# Patient Record
Sex: Male | Born: 1935
Health system: Southern US, Community
[De-identification: ages and names within clinical notes are randomized; demographics above are authoritative.]

## PROBLEM LIST (undated history)

## (undated) DIAGNOSIS — Z8719 Personal history of other diseases of the digestive system: Secondary | ICD-10-CM

## (undated) DIAGNOSIS — I340 Nonrheumatic mitral (valve) insufficiency: Secondary | ICD-10-CM

## (undated) DIAGNOSIS — I2510 Atherosclerotic heart disease of native coronary artery without angina pectoris: Secondary | ICD-10-CM

## (undated) DIAGNOSIS — H409 Unspecified glaucoma: Secondary | ICD-10-CM

## (undated) DIAGNOSIS — M1990 Unspecified osteoarthritis, unspecified site: Secondary | ICD-10-CM

## (undated) DIAGNOSIS — I10 Essential (primary) hypertension: Secondary | ICD-10-CM

## (undated) DIAGNOSIS — R42 Dizziness and giddiness: Secondary | ICD-10-CM

## (undated) DIAGNOSIS — K222 Esophageal obstruction: Secondary | ICD-10-CM

## (undated) DIAGNOSIS — G629 Polyneuropathy, unspecified: Secondary | ICD-10-CM

## (undated) DIAGNOSIS — K219 Gastro-esophageal reflux disease without esophagitis: Secondary | ICD-10-CM

## (undated) DIAGNOSIS — C801 Malignant (primary) neoplasm, unspecified: Secondary | ICD-10-CM

## (undated) DIAGNOSIS — I739 Peripheral vascular disease, unspecified: Secondary | ICD-10-CM

## (undated) HISTORY — PX: OTHER SURGICAL HISTORY: SHX169

## (undated) HISTORY — PX: ESOPHAGOGASTRODUODENOSCOPY: SHX1529

## (undated) HISTORY — PX: COLONOSCOPY: SHX174

---

## 1993-07-14 DIAGNOSIS — C801 Malignant (primary) neoplasm, unspecified: Secondary | ICD-10-CM

## 1993-07-14 HISTORY — PX: OTHER SURGICAL HISTORY: SHX169

## 1993-07-14 HISTORY — DX: Malignant (primary) neoplasm, unspecified: C80.1

## 1999-07-15 HISTORY — PX: ROTATOR CUFF REPAIR: SHX139

## 2003-07-15 HISTORY — PX: CORONARY ANGIOPLASTY WITH STENT PLACEMENT: SHX49

## 2006-12-28 DIAGNOSIS — J309 Allergic rhinitis, unspecified: Secondary | ICD-10-CM | POA: Insufficient documentation

## 2007-06-23 DIAGNOSIS — R209 Unspecified disturbances of skin sensation: Secondary | ICD-10-CM | POA: Insufficient documentation

## 2007-06-23 DIAGNOSIS — I8390 Asymptomatic varicose veins of unspecified lower extremity: Secondary | ICD-10-CM | POA: Insufficient documentation

## 2007-06-23 HISTORY — DX: Unspecified disturbances of skin sensation: R20.9

## 2008-04-13 DIAGNOSIS — D126 Benign neoplasm of colon, unspecified: Secondary | ICD-10-CM | POA: Insufficient documentation

## 2008-04-13 HISTORY — DX: Benign neoplasm of colon, unspecified: D12.6

## 2009-05-09 DIAGNOSIS — I25700 Atherosclerosis of coronary artery bypass graft(s), unspecified, with unstable angina pectoris: Secondary | ICD-10-CM | POA: Insufficient documentation

## 2009-05-09 DIAGNOSIS — I2510 Atherosclerotic heart disease of native coronary artery without angina pectoris: Secondary | ICD-10-CM | POA: Insufficient documentation

## 2009-05-09 DIAGNOSIS — I10 Essential (primary) hypertension: Secondary | ICD-10-CM | POA: Insufficient documentation

## 2009-07-14 HISTORY — PX: CORONARY ARTERY BYPASS GRAFT: SHX141

## 2012-10-05 DIAGNOSIS — I214 Non-ST elevation (NSTEMI) myocardial infarction: Secondary | ICD-10-CM

## 2012-10-05 HISTORY — DX: Non-ST elevation (NSTEMI) myocardial infarction: I21.4

## 2012-10-06 DIAGNOSIS — I25700 Atherosclerosis of coronary artery bypass graft(s), unspecified, with unstable angina pectoris: Secondary | ICD-10-CM | POA: Insufficient documentation

## 2013-03-07 DIAGNOSIS — G4700 Insomnia, unspecified: Secondary | ICD-10-CM | POA: Insufficient documentation

## 2013-11-11 DIAGNOSIS — IMO0002 Reserved for concepts with insufficient information to code with codable children: Secondary | ICD-10-CM | POA: Insufficient documentation

## 2013-11-11 DIAGNOSIS — S83209A Unspecified tear of unspecified meniscus, current injury, unspecified knee, initial encounter: Secondary | ICD-10-CM | POA: Insufficient documentation

## 2013-11-11 HISTORY — DX: Unspecified tear of unspecified meniscus, current injury, unspecified knee, initial encounter: S83.209A

## 2014-02-22 DIAGNOSIS — IMO0002 Reserved for concepts with insufficient information to code with codable children: Secondary | ICD-10-CM | POA: Insufficient documentation

## 2014-02-22 DIAGNOSIS — M1710 Unilateral primary osteoarthritis, unspecified knee: Secondary | ICD-10-CM | POA: Insufficient documentation

## 2014-07-14 HISTORY — PX: CORONARY STENT PLACEMENT: SHX1402

## 2015-07-22 DIAGNOSIS — E785 Hyperlipidemia, unspecified: Secondary | ICD-10-CM | POA: Insufficient documentation

## 2016-01-25 DIAGNOSIS — I25119 Atherosclerotic heart disease of native coronary artery with unspecified angina pectoris: Secondary | ICD-10-CM | POA: Diagnosis not present

## 2016-01-25 DIAGNOSIS — Z951 Presence of aortocoronary bypass graft: Secondary | ICD-10-CM | POA: Diagnosis not present

## 2016-01-25 DIAGNOSIS — I10 Essential (primary) hypertension: Secondary | ICD-10-CM | POA: Diagnosis not present

## 2016-01-25 DIAGNOSIS — K222 Esophageal obstruction: Secondary | ICD-10-CM | POA: Diagnosis not present

## 2016-01-29 DIAGNOSIS — I209 Angina pectoris, unspecified: Secondary | ICD-10-CM | POA: Diagnosis not present

## 2016-01-29 DIAGNOSIS — E785 Hyperlipidemia, unspecified: Secondary | ICD-10-CM | POA: Diagnosis not present

## 2016-01-29 DIAGNOSIS — I129 Hypertensive chronic kidney disease with stage 1 through stage 4 chronic kidney disease, or unspecified chronic kidney disease: Secondary | ICD-10-CM | POA: Diagnosis not present

## 2016-01-29 DIAGNOSIS — N182 Chronic kidney disease, stage 2 (mild): Secondary | ICD-10-CM | POA: Diagnosis not present

## 2016-02-01 DIAGNOSIS — H04123 Dry eye syndrome of bilateral lacrimal glands: Secondary | ICD-10-CM | POA: Diagnosis not present

## 2016-02-01 DIAGNOSIS — H01021 Squamous blepharitis right upper eyelid: Secondary | ICD-10-CM | POA: Diagnosis not present

## 2016-02-01 DIAGNOSIS — H01025 Squamous blepharitis left lower eyelid: Secondary | ICD-10-CM | POA: Diagnosis not present

## 2016-02-01 DIAGNOSIS — H2513 Age-related nuclear cataract, bilateral: Secondary | ICD-10-CM | POA: Diagnosis not present

## 2016-02-01 DIAGNOSIS — H01022 Squamous blepharitis right lower eyelid: Secondary | ICD-10-CM | POA: Diagnosis not present

## 2016-02-01 DIAGNOSIS — H10413 Chronic giant papillary conjunctivitis, bilateral: Secondary | ICD-10-CM | POA: Diagnosis not present

## 2016-02-01 DIAGNOSIS — H01024 Squamous blepharitis left upper eyelid: Secondary | ICD-10-CM | POA: Diagnosis not present

## 2016-02-06 DIAGNOSIS — I2510 Atherosclerotic heart disease of native coronary artery without angina pectoris: Secondary | ICD-10-CM | POA: Diagnosis not present

## 2016-02-29 DIAGNOSIS — K222 Esophageal obstruction: Secondary | ICD-10-CM | POA: Diagnosis not present

## 2016-02-29 DIAGNOSIS — Z951 Presence of aortocoronary bypass graft: Secondary | ICD-10-CM | POA: Diagnosis not present

## 2016-02-29 DIAGNOSIS — I10 Essential (primary) hypertension: Secondary | ICD-10-CM | POA: Diagnosis not present

## 2016-02-29 DIAGNOSIS — I25119 Atherosclerotic heart disease of native coronary artery with unspecified angina pectoris: Secondary | ICD-10-CM | POA: Diagnosis not present

## 2016-03-21 DIAGNOSIS — Z23 Encounter for immunization: Secondary | ICD-10-CM | POA: Diagnosis not present

## 2016-03-21 DIAGNOSIS — M25551 Pain in right hip: Secondary | ICD-10-CM | POA: Diagnosis not present

## 2016-03-21 DIAGNOSIS — R600 Localized edema: Secondary | ICD-10-CM | POA: Diagnosis not present

## 2016-03-28 DIAGNOSIS — M1611 Unilateral primary osteoarthritis, right hip: Secondary | ICD-10-CM | POA: Diagnosis not present

## 2016-03-28 DIAGNOSIS — M25551 Pain in right hip: Secondary | ICD-10-CM | POA: Diagnosis not present

## 2016-03-28 DIAGNOSIS — M47896 Other spondylosis, lumbar region: Secondary | ICD-10-CM | POA: Diagnosis not present

## 2016-04-04 DIAGNOSIS — H40013 Open angle with borderline findings, low risk, bilateral: Secondary | ICD-10-CM | POA: Diagnosis not present

## 2016-04-04 DIAGNOSIS — H01021 Squamous blepharitis right upper eyelid: Secondary | ICD-10-CM | POA: Diagnosis not present

## 2016-04-04 DIAGNOSIS — H01024 Squamous blepharitis left upper eyelid: Secondary | ICD-10-CM | POA: Diagnosis not present

## 2016-04-04 DIAGNOSIS — H04123 Dry eye syndrome of bilateral lacrimal glands: Secondary | ICD-10-CM | POA: Diagnosis not present

## 2016-04-04 DIAGNOSIS — H10413 Chronic giant papillary conjunctivitis, bilateral: Secondary | ICD-10-CM | POA: Diagnosis not present

## 2016-04-04 DIAGNOSIS — H2513 Age-related nuclear cataract, bilateral: Secondary | ICD-10-CM | POA: Diagnosis not present

## 2016-04-04 DIAGNOSIS — H43811 Vitreous degeneration, right eye: Secondary | ICD-10-CM | POA: Diagnosis not present

## 2016-04-04 DIAGNOSIS — H01025 Squamous blepharitis left lower eyelid: Secondary | ICD-10-CM | POA: Diagnosis not present

## 2016-04-04 DIAGNOSIS — H01022 Squamous blepharitis right lower eyelid: Secondary | ICD-10-CM | POA: Diagnosis not present

## 2016-04-30 DIAGNOSIS — M7130 Other bursal cyst, unspecified site: Secondary | ICD-10-CM | POA: Diagnosis not present

## 2016-04-30 DIAGNOSIS — D225 Melanocytic nevi of trunk: Secondary | ICD-10-CM | POA: Diagnosis not present

## 2016-04-30 DIAGNOSIS — Z1283 Encounter for screening for malignant neoplasm of skin: Secondary | ICD-10-CM | POA: Diagnosis not present

## 2016-04-30 DIAGNOSIS — X32XXXA Exposure to sunlight, initial encounter: Secondary | ICD-10-CM | POA: Diagnosis not present

## 2016-04-30 DIAGNOSIS — L570 Actinic keratosis: Secondary | ICD-10-CM | POA: Diagnosis not present

## 2016-04-30 DIAGNOSIS — D171 Benign lipomatous neoplasm of skin and subcutaneous tissue of trunk: Secondary | ICD-10-CM | POA: Diagnosis not present

## 2016-05-07 DIAGNOSIS — R1310 Dysphagia, unspecified: Secondary | ICD-10-CM | POA: Diagnosis not present

## 2016-05-07 DIAGNOSIS — E785 Hyperlipidemia, unspecified: Secondary | ICD-10-CM | POA: Diagnosis not present

## 2016-05-07 DIAGNOSIS — K219 Gastro-esophageal reflux disease without esophagitis: Secondary | ICD-10-CM | POA: Diagnosis not present

## 2016-05-19 DIAGNOSIS — C61 Malignant neoplasm of prostate: Secondary | ICD-10-CM | POA: Diagnosis not present

## 2016-05-19 DIAGNOSIS — N393 Stress incontinence (female) (male): Secondary | ICD-10-CM | POA: Diagnosis not present

## 2016-05-27 DIAGNOSIS — Z125 Encounter for screening for malignant neoplasm of prostate: Secondary | ICD-10-CM | POA: Diagnosis not present

## 2016-05-27 DIAGNOSIS — E785 Hyperlipidemia, unspecified: Secondary | ICD-10-CM | POA: Diagnosis not present

## 2016-05-27 DIAGNOSIS — E559 Vitamin D deficiency, unspecified: Secondary | ICD-10-CM | POA: Diagnosis not present

## 2016-05-27 DIAGNOSIS — I129 Hypertensive chronic kidney disease with stage 1 through stage 4 chronic kidney disease, or unspecified chronic kidney disease: Secondary | ICD-10-CM | POA: Diagnosis not present

## 2016-05-27 DIAGNOSIS — Z0000 Encounter for general adult medical examination without abnormal findings: Secondary | ICD-10-CM | POA: Diagnosis not present

## 2016-06-03 DIAGNOSIS — E785 Hyperlipidemia, unspecified: Secondary | ICD-10-CM | POA: Diagnosis not present

## 2016-06-03 DIAGNOSIS — N182 Chronic kidney disease, stage 2 (mild): Secondary | ICD-10-CM | POA: Diagnosis not present

## 2016-06-03 DIAGNOSIS — Z0000 Encounter for general adult medical examination without abnormal findings: Secondary | ICD-10-CM | POA: Diagnosis not present

## 2016-06-03 DIAGNOSIS — I129 Hypertensive chronic kidney disease with stage 1 through stage 4 chronic kidney disease, or unspecified chronic kidney disease: Secondary | ICD-10-CM | POA: Diagnosis not present

## 2016-07-14 HISTORY — PX: ESOPHAGOGASTRODUODENOSCOPY (EGD) WITH ESOPHAGEAL DILATION: SHX5812

## 2016-07-17 DIAGNOSIS — J019 Acute sinusitis, unspecified: Secondary | ICD-10-CM | POA: Diagnosis not present

## 2016-07-27 DIAGNOSIS — E782 Mixed hyperlipidemia: Secondary | ICD-10-CM | POA: Diagnosis not present

## 2016-07-27 DIAGNOSIS — I10 Essential (primary) hypertension: Secondary | ICD-10-CM | POA: Diagnosis not present

## 2016-07-27 DIAGNOSIS — I2582 Chronic total occlusion of coronary artery: Secondary | ICD-10-CM | POA: Diagnosis not present

## 2016-07-27 DIAGNOSIS — R9439 Abnormal result of other cardiovascular function study: Secondary | ICD-10-CM | POA: Diagnosis not present

## 2016-07-27 DIAGNOSIS — K219 Gastro-esophageal reflux disease without esophagitis: Secondary | ICD-10-CM | POA: Insufficient documentation

## 2016-07-27 DIAGNOSIS — R079 Chest pain, unspecified: Secondary | ICD-10-CM

## 2016-07-27 DIAGNOSIS — Z951 Presence of aortocoronary bypass graft: Secondary | ICD-10-CM | POA: Diagnosis not present

## 2016-07-27 DIAGNOSIS — I25700 Atherosclerosis of coronary artery bypass graft(s), unspecified, with unstable angina pectoris: Secondary | ICD-10-CM | POA: Diagnosis not present

## 2016-07-27 DIAGNOSIS — K222 Esophageal obstruction: Secondary | ICD-10-CM | POA: Diagnosis not present

## 2016-07-27 DIAGNOSIS — Z955 Presence of coronary angioplasty implant and graft: Secondary | ICD-10-CM | POA: Diagnosis not present

## 2016-07-27 DIAGNOSIS — R0789 Other chest pain: Secondary | ICD-10-CM | POA: Diagnosis not present

## 2016-07-27 DIAGNOSIS — T82855A Stenosis of coronary artery stent, initial encounter: Secondary | ICD-10-CM | POA: Diagnosis not present

## 2016-07-27 HISTORY — DX: Chest pain, unspecified: R07.9

## 2016-07-28 DIAGNOSIS — R079 Chest pain, unspecified: Secondary | ICD-10-CM | POA: Diagnosis not present

## 2016-07-28 DIAGNOSIS — R0789 Other chest pain: Secondary | ICD-10-CM | POA: Diagnosis not present

## 2016-07-28 DIAGNOSIS — I25700 Atherosclerosis of coronary artery bypass graft(s), unspecified, with unstable angina pectoris: Secondary | ICD-10-CM | POA: Diagnosis not present

## 2016-07-28 DIAGNOSIS — I10 Essential (primary) hypertension: Secondary | ICD-10-CM | POA: Diagnosis not present

## 2016-07-28 DIAGNOSIS — R1011 Right upper quadrant pain: Secondary | ICD-10-CM | POA: Diagnosis not present

## 2016-07-28 DIAGNOSIS — E782 Mixed hyperlipidemia: Secondary | ICD-10-CM | POA: Diagnosis not present

## 2016-07-28 DIAGNOSIS — K219 Gastro-esophageal reflux disease without esophagitis: Secondary | ICD-10-CM | POA: Diagnosis not present

## 2016-07-28 DIAGNOSIS — K222 Esophageal obstruction: Secondary | ICD-10-CM | POA: Diagnosis not present

## 2016-07-28 DIAGNOSIS — R9439 Abnormal result of other cardiovascular function study: Secondary | ICD-10-CM | POA: Diagnosis not present

## 2016-07-29 DIAGNOSIS — K219 Gastro-esophageal reflux disease without esophagitis: Secondary | ICD-10-CM | POA: Diagnosis not present

## 2016-07-29 DIAGNOSIS — I25110 Atherosclerotic heart disease of native coronary artery with unstable angina pectoris: Secondary | ICD-10-CM | POA: Diagnosis not present

## 2016-07-29 DIAGNOSIS — R079 Chest pain, unspecified: Secondary | ICD-10-CM | POA: Diagnosis not present

## 2016-07-29 DIAGNOSIS — Z951 Presence of aortocoronary bypass graft: Secondary | ICD-10-CM | POA: Diagnosis not present

## 2016-07-29 DIAGNOSIS — I25700 Atherosclerosis of coronary artery bypass graft(s), unspecified, with unstable angina pectoris: Secondary | ICD-10-CM | POA: Diagnosis not present

## 2016-07-29 DIAGNOSIS — R0789 Other chest pain: Secondary | ICD-10-CM | POA: Diagnosis not present

## 2016-07-30 DIAGNOSIS — R0789 Other chest pain: Secondary | ICD-10-CM | POA: Diagnosis not present

## 2016-07-30 DIAGNOSIS — R079 Chest pain, unspecified: Secondary | ICD-10-CM | POA: Diagnosis not present

## 2016-07-30 DIAGNOSIS — I25700 Atherosclerosis of coronary artery bypass graft(s), unspecified, with unstable angina pectoris: Secondary | ICD-10-CM | POA: Diagnosis not present

## 2016-07-30 DIAGNOSIS — I10 Essential (primary) hypertension: Secondary | ICD-10-CM | POA: Diagnosis not present

## 2016-07-30 DIAGNOSIS — K219 Gastro-esophageal reflux disease without esophagitis: Secondary | ICD-10-CM | POA: Diagnosis not present

## 2016-08-07 DIAGNOSIS — Z09 Encounter for follow-up examination after completed treatment for conditions other than malignant neoplasm: Secondary | ICD-10-CM | POA: Diagnosis not present

## 2016-08-11 DIAGNOSIS — M940 Chondrocostal junction syndrome [Tietze]: Secondary | ICD-10-CM | POA: Diagnosis not present

## 2016-08-11 DIAGNOSIS — R1310 Dysphagia, unspecified: Secondary | ICD-10-CM | POA: Diagnosis not present

## 2016-08-11 DIAGNOSIS — K219 Gastro-esophageal reflux disease without esophagitis: Secondary | ICD-10-CM | POA: Diagnosis not present

## 2016-08-12 DIAGNOSIS — E7800 Pure hypercholesterolemia, unspecified: Secondary | ICD-10-CM | POA: Diagnosis not present

## 2016-08-12 DIAGNOSIS — I10 Essential (primary) hypertension: Secondary | ICD-10-CM | POA: Diagnosis not present

## 2016-08-12 DIAGNOSIS — I25119 Atherosclerotic heart disease of native coronary artery with unspecified angina pectoris: Secondary | ICD-10-CM | POA: Diagnosis not present

## 2016-08-12 DIAGNOSIS — K222 Esophageal obstruction: Secondary | ICD-10-CM | POA: Diagnosis not present

## 2016-08-14 DIAGNOSIS — K222 Esophageal obstruction: Secondary | ICD-10-CM

## 2016-08-14 HISTORY — DX: Esophageal obstruction: K22.2

## 2016-08-18 DIAGNOSIS — M7130 Other bursal cyst, unspecified site: Secondary | ICD-10-CM | POA: Diagnosis not present

## 2016-08-18 DIAGNOSIS — L820 Inflamed seborrheic keratosis: Secondary | ICD-10-CM | POA: Diagnosis not present

## 2016-08-25 DIAGNOSIS — K2970 Gastritis, unspecified, without bleeding: Secondary | ICD-10-CM | POA: Diagnosis not present

## 2016-08-25 DIAGNOSIS — R1310 Dysphagia, unspecified: Secondary | ICD-10-CM | POA: Diagnosis not present

## 2016-08-26 DIAGNOSIS — K219 Gastro-esophageal reflux disease without esophagitis: Secondary | ICD-10-CM | POA: Diagnosis not present

## 2016-08-26 DIAGNOSIS — K2970 Gastritis, unspecified, without bleeding: Secondary | ICD-10-CM | POA: Diagnosis not present

## 2016-08-26 DIAGNOSIS — R1310 Dysphagia, unspecified: Secondary | ICD-10-CM | POA: Diagnosis not present

## 2016-08-29 ENCOUNTER — Ambulatory Visit: Payer: Self-pay | Admitting: Surgery

## 2016-08-29 DIAGNOSIS — D171 Benign lipomatous neoplasm of skin and subcutaneous tissue of trunk: Secondary | ICD-10-CM | POA: Diagnosis not present

## 2016-08-29 NOTE — H&P (Signed)
History of Present Illness ([name] [name] K. [name] Cheatwood MD; 08/29/2016 11:09 AM) The patient is a 81 year old male who presents with a complaint of Mass. Referred by Dr. John [name] for back lipoma PCP - [name] [name] GI - [name] [name] Cards - [name] [name]  This is a 81-year-old male who presents with a 5 year history of an enlarging mass on his upper back near the base of his neck. This has become large and uncomfortable. Sometimes it causes headaches as well as pain radiating down into his right arm. Currently the patient is not on any blood thinners. He would like to have this mass removed.   Past Surgical History ([name] [name], CMA; 08/29/2016 10:37 AM) Bypass Surgery for Poor Blood Flow to Legs Colon Polyp Removal - Colonoscopy Coronary Artery Bypass Graft Knee Surgery Bilateral. Prostate Surgery - Removal Shoulder Surgery Left.  Diagnostic Studies History ([name] [name], CMA; 08/29/2016 10:37 AM) Colonoscopy 1-5 years ago  Allergies ([name] [name], CMA; 08/29/2016 10:39 AM) HYDROCODONE Rash, Itching.  Medication History ([name] [name], CMA; 08/29/2016 10:42 AM) Aspirin (81MG Tablet, Oral) Active. AmLODIPine Besylate (5MG Tablet, Oral) Active. Brilinta (90MG Tablet, Oral) Active. Cetirizine HCl (10MG Tablet, Oral) Active. Fluticasone Propionate (50MCG/ACT Suspension, Nasal) Active. Lisinopril (20MG Tablet, Oral) Active. Metoprolol Tartrate (25MG Tablet, Oral) Active. Pantoprazole Sodium (40MG Tablet DR, Oral) Active. Praluent (75MG/ML Soln Pen-inj, Subcutaneous) Active. Pravastatin Sodium (80MG Tablet, Oral) Active. Sucralfate (1GM Tablet, Oral) Active. Acetaminophen (650MG Tablet, Oral) Active. Vitamin D (Cholecalciferol) (1000UNIT Capsule, Oral) Active. Zolpidem Tartrate (10MG Tablet, Oral) Active. Medications Reconciled  Social History ([name] [name], CMA; 08/29/2016 10:37 AM) Alcohol use Occasional alcohol use. No caffeine use No drug use Tobacco use Never  smoker.  Family History ([name] [name], CMA; 08/29/2016 10:37 AM) Alcohol Abuse Family Members In General. Arthritis Mother, Sister. Heart Disease Father. Hypertension Father, Sister.  Other Problems ([name] [name], CMA; 08/29/2016 10:37 AM) Arthritis Back Pain Chest pain Diverticulosis Enlarged Prostate Gastric Ulcer Gastroesophageal Reflux Disease High blood pressure Hypercholesterolemia Myocardial infarction Prostate Cancer Sleep Apnea     Review of Systems ([name] [name] CMA; 08/29/2016 10:37 AM) General Not Present- Appetite Loss, Chills, Fatigue, Fever, Night Sweats, Weight Gain and Weight Loss. Skin Not Present- Change in Wart/Mole, Dryness, Hives, Jaundice, New Lesions, Non-Healing Wounds, Rash and Ulcer. HEENT Present- Hearing Loss, Seasonal Allergies and Wears glasses/contact lenses. Not Present- Earache, Hoarseness, Nose Bleed, Oral Ulcers, Ringing in the Ears, Sinus Pain, Sore Throat, Visual Disturbances and Yellow Eyes. Respiratory Not Present- Bloody sputum, Chronic Cough, Difficulty Breathing, Snoring and Wheezing. Cardiovascular Present- Chest Pain, Leg Cramps and Swelling of Extremities. Not Present- Difficulty Breathing Lying Down, Palpitations, Rapid Heart Rate and Shortness of Breath. Gastrointestinal Present- Abdominal Pain and Gets full quickly at meals. Not Present- Bloating, Bloody Stool, Change in Bowel Habits, Chronic diarrhea, Constipation, Difficulty Swallowing, Excessive gas, Hemorrhoids, Indigestion, Nausea, Rectal Pain and Vomiting. Male Genitourinary Present- Change in Urinary Stream, Frequency, Impotence and Urine Leakage. Not Present- Blood in Urine, Nocturia, Painful Urination and Urgency. Musculoskeletal Present- Joint Pain and Joint Stiffness. Not Present- Back Pain, Muscle Pain, Muscle Weakness and Swelling of Extremities. Neurological Present- Trouble walking. Not Present- Decreased Memory, Fainting, Headaches, Numbness, Seizures,  Tingling, Tremor and Weakness.  Vitals ([name] [name] CMA; 08/29/2016 10:38 AM) 08/29/2016 10:37 AM Weight: 194.38 lb Height: 72in Body Surface Area: 2.1 m Body Mass Index: 26.36 kg/m  Temp.: 98.1F  Pulse: 54 (Regular)  P.OX: 97% (Room air) BP: 128/62 (Sitting, Left Arm, Standard)      Physical Exam ([name] [name]   [name] MD; 08/29/2016 11:10 AM)  The physical exam findings are as follows: Note:WDWN in NAD Eyes: Pupils equal, round; sclera anicteric HENT: Oral mucosa moist; good dentition Neck: No masses palpated, no thyromegaly Lungs: CTA bilaterally; normal respiratory effort CV: Healed median sternotomy scar; Regular rate and rhythm; no murmurs; extremities well-perfused with no edema Abd: +bowel sounds, soft, non-tender, no palpable organomegaly; no palpable hernias Skin: Warm, dry; no sign of jaundice Back: Upper back near base of neck just to the right of midline - protruding 4 cm soft, smooth, well-demarcated mass Psychiatric - alert and oriented x 4; calm mood and affect    Assessment & Plan ([name] [name] K. Grover Robinson MD; 08/29/2016 10:46 AM)  LIPOMA OF BACK (D17.1) Impression: 4 cm upper back; subcutaneous  Current Plans Schedule for Surgery - Excision of subcutaneous lipoma - upper back 4 cm. The surgical procedure has been discussed with the patient. Potential risks, benefits, alternative treatments, and expected outcomes have been explained. All of the patient's questions at this time have been answered. The likelihood of reaching the patient's treatment goal is good. The patient understand the proposed surgical procedure and wishes to proceed.  Prone position  Cardiac clearance first by [name] [name].  [name] [name] K. [name] Wiesman, MD, FACS [facility]  General/ Trauma Surgery  08/29/2016 11:11 AM  

## 2016-09-08 ENCOUNTER — Encounter (HOSPITAL_BASED_OUTPATIENT_CLINIC_OR_DEPARTMENT_OTHER): Payer: Self-pay | Admitting: *Deleted

## 2016-09-23 ENCOUNTER — Encounter (HOSPITAL_COMMUNITY): Payer: Self-pay

## 2016-09-23 ENCOUNTER — Encounter (HOSPITAL_COMMUNITY)
Admission: RE | Admit: 2016-09-23 | Discharge: 2016-09-23 | Disposition: A | Discharge status: Home / Self Care | Payer: Medicare HMO | Source: Ambulatory Visit | Attending: Surgery | Admitting: Surgery

## 2016-09-23 DIAGNOSIS — Z95820 Peripheral vascular angioplasty status with implants and grafts: Secondary | ICD-10-CM | POA: Diagnosis not present

## 2016-09-23 DIAGNOSIS — I739 Peripheral vascular disease, unspecified: Secondary | ICD-10-CM | POA: Diagnosis not present

## 2016-09-23 DIAGNOSIS — I2510 Atherosclerotic heart disease of native coronary artery without angina pectoris: Secondary | ICD-10-CM | POA: Diagnosis not present

## 2016-09-23 DIAGNOSIS — I10 Essential (primary) hypertension: Secondary | ICD-10-CM | POA: Diagnosis not present

## 2016-09-23 DIAGNOSIS — Z8546 Personal history of malignant neoplasm of prostate: Secondary | ICD-10-CM | POA: Diagnosis not present

## 2016-09-23 DIAGNOSIS — Z951 Presence of aortocoronary bypass graft: Secondary | ICD-10-CM | POA: Diagnosis not present

## 2016-09-23 DIAGNOSIS — I252 Old myocardial infarction: Secondary | ICD-10-CM | POA: Diagnosis not present

## 2016-09-23 DIAGNOSIS — Z79899 Other long term (current) drug therapy: Secondary | ICD-10-CM | POA: Diagnosis not present

## 2016-09-23 DIAGNOSIS — E7800 Pure hypercholesterolemia, unspecified: Secondary | ICD-10-CM | POA: Diagnosis not present

## 2016-09-23 DIAGNOSIS — Z955 Presence of coronary angioplasty implant and graft: Secondary | ICD-10-CM | POA: Diagnosis not present

## 2016-09-23 DIAGNOSIS — D171 Benign lipomatous neoplasm of skin and subcutaneous tissue of trunk: Secondary | ICD-10-CM | POA: Diagnosis not present

## 2016-09-23 DIAGNOSIS — G4730 Sleep apnea, unspecified: Secondary | ICD-10-CM | POA: Diagnosis not present

## 2016-09-23 DIAGNOSIS — K219 Gastro-esophageal reflux disease without esophagitis: Secondary | ICD-10-CM | POA: Diagnosis not present

## 2016-09-23 DIAGNOSIS — Z7982 Long term (current) use of aspirin: Secondary | ICD-10-CM | POA: Diagnosis not present

## 2016-09-23 DIAGNOSIS — Z7951 Long term (current) use of inhaled steroids: Secondary | ICD-10-CM | POA: Diagnosis not present

## 2016-09-23 HISTORY — DX: Essential (primary) hypertension: I10

## 2016-09-23 HISTORY — DX: Unspecified osteoarthritis, unspecified site: M19.90

## 2016-09-23 HISTORY — DX: Esophageal obstruction: K22.2

## 2016-09-23 LAB — BASIC METABOLIC PANEL
Anion gap: 11 (ref 5–15)
BUN: 9 mg/dL (ref 6–20)
CALCIUM: 8.9 mg/dL (ref 8.9–10.3)
CO2: 24 mmol/L (ref 22–32)
CREATININE: 0.84 mg/dL (ref 0.61–1.24)
Chloride: 104 mmol/L (ref 101–111)
GFR calc non Af Amer: >60 mL/min (ref >60)
Glucose, Bld: 115 mg/dL — ABNORMAL HIGH (ref 65–99)
Potassium: 4 mmol/L (ref 3.5–5.1)
SODIUM: 139 mmol/L (ref 135–145)

## 2016-09-23 LAB — CBC
HCT: 38.8 % — ABNORMAL LOW (ref 39.0–52.0)
Hemoglobin: 12.8 g/dL — ABNORMAL LOW (ref 13.0–17.0)
MCH: 29.2 pg (ref 26.0–34.0)
MCHC: 33 g/dL (ref 30.0–36.0)
MCV: 88.4 fL (ref 78.0–100.0)
PLATELETS: 146 10*3/uL — AB (ref 150–400)
RBC: 4.39 MIL/uL (ref 4.22–5.81)
RDW: 12.1 % (ref 11.5–15.5)
WBC: 4.4 10*3/uL (ref 4.0–10.5)

## 2016-09-23 NOTE — Anesthesia Preprocedure Evaluation (Addendum)
Anesthesia Evaluation  Patient identified by MRN, date of birth, ID band Patient awake    Reviewed: Allergy & Precautions, NPO status , Patient's Chart, lab work & pertinent test results  Airway Mallampati: III  TM Distance: >3 FB Neck ROM: Full    Dental  (+) Dental Advisory Given   Pulmonary neg pulmonary ROS,    breath sounds clear to auscultation       Cardiovascular hypertension, Pt. on medications and Pt. on home beta blockers + CAD, + Cardiac Stents, + CABG and + Peripheral Vascular Disease   Rhythm:Regular Rate:Normal  Cardiac cath 07/29/16 (care everywhere):  1. 2 vessel coronary artery disease status post CABG surgery. LAD is occluded at the junction of proximal and midsegment. The first diagonal is occluded at its origin.  2. LIMA to LAD is patent however LAD beyond the touchdown is small and has diffuse disease.  3. SVG to diagonal is patent there are previous stents showing mild instent restenosis with maximum of 40-50% with TIMI-3 flow.  4. normal left ventricular end-diastolic pressure and no gradient across the aortic valve.   Echo 02/06/16 (from Dr. Irven Shelling notes) showed:  1. LV cavity normal in size. Mild LVH. Mild basal septal hypertrophy. Normal global wall motion. Normal diastolic filling pattern. Calculated EF 69%. 2. LA cavity moderately dilated at 4.7 cm. 3. Mild aortic regurgitation. 4. Mild to moderate mitral regurgitation. 5. Mild to moderate tricuspid regurgitation. 6. Borderline elevation in PA pressure at 30 mmHg suggest mild pulmonary hypertension.  7. IVC is dilated with respiratory variation. This may suggest elevated right heart pressure.   Neuro/Psych negative neurological ROS     GI/Hepatic Neg liver ROS, GERD  ,  Endo/Other  negative endocrine ROS  Renal/GU negative Renal ROS     Musculoskeletal  (+) Arthritis ,   Abdominal   Peds  Hematology  (+) anemia ,   Anesthesia Other  Findings   Reproductive/Obstetrics                            Lab Results  Component Value Date   WBC 4.4 09/23/2016   HGB 12.8 (L) 09/23/2016   HCT 38.8 (L) 09/23/2016   MCV 88.4 09/23/2016   PLT 146 (L) 09/23/2016   Lab Results  Component Value Date   CREATININE 0.84 09/23/2016   BUN 9 09/23/2016   NA 139 09/23/2016   K 4.0 09/23/2016   CL 104 09/23/2016   CO2 24 09/23/2016    Anesthesia Physical Anesthesia Plan  ASA: III  Anesthesia Plan: General   Post-op Pain Management:    Induction: Intravenous  Airway Management Planned: Oral ETT  Additional Equipment:   Intra-op Plan:   Post-operative Plan: Extubation in OR  Informed Consent: I have reviewed the patients History and Physical, chart, labs and discussed the procedure including the risks, benefits and alternatives for the proposed anesthesia with the patient or authorized representative who has indicated his/her understanding and acceptance.   Dental advisory given  Plan Discussed with:   Anesthesia Plan Comments:        Anesthesia Quick Evaluation

## 2016-09-23 NOTE — Progress Notes (Signed)
Anesthesia Chart Review:  Pt is a 81 year old male scheduled for excision subcutaneous lipoma on upper back on 09/24/2016 with Donnie Mesa, MD  - Cardiologist is Kela Millin, MD  PMH includes:  CAD (s/p CABG x2 2010 and stent to SVG to D2 2014 and 2017, all in [place]), HTN, hyperlipidemia, prostate cancer, GERD.  BP (!) 157/63   Pulse (!) 55   Temp 36.6 C (Oral)   Resp 20   Ht 6' (1.829 m)   Wt 197 lb 9 oz (89.6 kg)   SpO2 97%   BMI 26.79 kg/m    Medications include: praluent, amlodipine, ASA 81mg , lisinopril, metoprolol, protonix, pravastatin  EKG 09/23/16: Sinus rhythm with 1st degree A-V block. Nonspecific ST abnormality  Cardiac cath 07/29/16 (care everywhere):  1. 2 vessel coronary artery disease status post CABG surgery. LAD is occluded at the junction of proximal and midsegment. The first diagonal is occluded at its origin.  2. LIMA to LAD is patent however LAD beyond the touchdown is small and has diffuse disease.  3. SVG to diagonal is patent there are previous stents showing mild instent restenosis with maximum of 40-50% with TIMI-3 flow.  4. normal left ventricular end-diastolic pressure and no gradient across the aortic valve.   Echo 02/06/16 (from Dr. Irven Shelling notes) showed:  1. LV cavity normal in size. Mild LVH. Mild basal septal hypertrophy. Normal global wall motion. Normal diastolic filling pattern. Calculated EF 69%. 2. LA cavity moderately dilated at 4.7 cm. 3. Mild aortic regurgitation. 4. Mild to moderate mitral regurgitation. 5. Mild to moderate tricuspid regurgitation. 6. Borderline elevation in PA pressure at 30 mmHg suggest mild pulmonary hypertension.  7. IVC is dilated with respiratory variation. This may suggest elevated right heart pressure.  At PAT today, pt complained of abdominal pain that radiates to back on occasion. Pt underwent cardiac cath (see above) 2 months ago for similar symptoms except radiation to back is new.  At time of  cath, sx thought to be related to GERD, pt also has hx of esophageal stricture.  I contacted Dr. Irven Shelling office about pt's sx.  Tiffany in that office spoke with Dr. Einar Gip who felt pt could proceed with surgery as scheduled as long as BP was controlled and pt was not experiencing SOB.  No CV sx documented at PAT.    Pt will need further assessment DOS by assigned anesthesiologist.  If no changes, I anticipate pt can proceed with surgery as scheduled.   Willeen Cass, FNP-BC [facility]/Anesthesiology Phone: [phone] 09/23/2016 4:48 PM

## 2016-09-23 NOTE — Progress Notes (Signed)
Mr. [name] [name] reported that he had mid stereum chest pain that radiates to his right back this am.  Patient also stated that his blood pressure was elevated this am more that normal- 156/70.  Patient took a NTG with relief.  Patient denied shortness of breath, lightheadedness or N/V. Patient reported that he is not sure if he has ever told Dr Nadyne Coombes about pain radiating to back.  I informed Willeen Cass , NP of patient complaint and treatment.

## 2016-09-23 NOTE — Pre-Procedure Instructions (Signed)
    [name] [name]  09/23/2016     Your procedure is scheduled on Wednesday, March 14.  Report to [facility] Admitting at 6:30AM                   Your surgery or procedure is scheduled for 8:30 AM   Call this number if you have problems the morning of surgery: [phone]    Remember:  Do not eat food or drink liquids after midnight.  Take these medicines the morning of surgery with A SIP OF WATER:amLODipine (NORVASC), cetirizine (ZYRTEC),metoprolol tartrate (LOPRESSOR), pantoprazole (PROTONIX).                 May take if needed Tylenol, Nitroglycerin, Tums.             STOP taking Aspirin , Aspirin Products (Goody Powder, Excedrin Migraine), Ibuprofen (Advil), Naproxen (Aleve), Viatiams and Herbal Products (ie Fish Oil).  Do not wear jewelry, make-up or nail polish.  Do not wear lotions, powders, or perfumes, or deodorant.  Do not shave 48 hours prior to surgery.  Men may shave face and neck.  Do not bring valuables to the hospital.  [facility] is not responsible for any belongings or valuables.  Contacts, dentures or bridgework may not be worn into surgery.  Leave your suitcase in the car.  After surgery it may be brought to your room.  For patients admitted to the hospital, discharge time will be determined by your treatment team.  Patients discharged the day of surgery will not be allowed to drive home.   Name and phone number of your driver:   -  Special instructions:  Review  [place] - Preparing For Surgery.  Please read over the following fact sheets that you were given. [place]- Preparing For Surgery , Pain Booklet, Coughing and Deep Breathing

## 2016-09-24 ENCOUNTER — Encounter (HOSPITAL_COMMUNITY): Payer: Self-pay | Admitting: *Deleted

## 2016-09-24 ENCOUNTER — Encounter (HOSPITAL_COMMUNITY)
Admission: RE | Disposition: A | Discharge status: Home / Self Care | Payer: Self-pay | Source: Ambulatory Visit | Attending: Surgery

## 2016-09-24 ENCOUNTER — Ambulatory Visit (HOSPITAL_COMMUNITY): Payer: Medicare HMO | Admitting: Anesthesiology

## 2016-09-24 ENCOUNTER — Ambulatory Visit (HOSPITAL_COMMUNITY): Payer: Medicare HMO | Admitting: Emergency Medicine

## 2016-09-24 ENCOUNTER — Ambulatory Visit (HOSPITAL_BASED_OUTPATIENT_CLINIC_OR_DEPARTMENT_OTHER)
Admission: RE | Admit: 2016-09-24 | Discharge: 2016-09-24 | Disposition: A | Discharge status: Home / Self Care | Payer: Medicare HMO | Source: Ambulatory Visit | Attending: Surgery | Admitting: Surgery

## 2016-09-24 DIAGNOSIS — D171 Benign lipomatous neoplasm of skin and subcutaneous tissue of trunk: Secondary | ICD-10-CM | POA: Diagnosis not present

## 2016-09-24 DIAGNOSIS — I739 Peripheral vascular disease, unspecified: Secondary | ICD-10-CM | POA: Insufficient documentation

## 2016-09-24 DIAGNOSIS — Z79899 Other long term (current) drug therapy: Secondary | ICD-10-CM | POA: Insufficient documentation

## 2016-09-24 DIAGNOSIS — I10 Essential (primary) hypertension: Secondary | ICD-10-CM | POA: Insufficient documentation

## 2016-09-24 DIAGNOSIS — Z955 Presence of coronary angioplasty implant and graft: Secondary | ICD-10-CM | POA: Insufficient documentation

## 2016-09-24 DIAGNOSIS — E7800 Pure hypercholesterolemia, unspecified: Secondary | ICD-10-CM | POA: Diagnosis not present

## 2016-09-24 DIAGNOSIS — I2510 Atherosclerotic heart disease of native coronary artery without angina pectoris: Secondary | ICD-10-CM | POA: Diagnosis not present

## 2016-09-24 DIAGNOSIS — Z7951 Long term (current) use of inhaled steroids: Secondary | ICD-10-CM | POA: Insufficient documentation

## 2016-09-24 DIAGNOSIS — I252 Old myocardial infarction: Secondary | ICD-10-CM | POA: Insufficient documentation

## 2016-09-24 DIAGNOSIS — G4730 Sleep apnea, unspecified: Secondary | ICD-10-CM | POA: Insufficient documentation

## 2016-09-24 DIAGNOSIS — Z951 Presence of aortocoronary bypass graft: Secondary | ICD-10-CM | POA: Diagnosis not present

## 2016-09-24 DIAGNOSIS — K219 Gastro-esophageal reflux disease without esophagitis: Secondary | ICD-10-CM | POA: Insufficient documentation

## 2016-09-24 DIAGNOSIS — Z8546 Personal history of malignant neoplasm of prostate: Secondary | ICD-10-CM | POA: Insufficient documentation

## 2016-09-24 DIAGNOSIS — Z95820 Peripheral vascular angioplasty status with implants and grafts: Secondary | ICD-10-CM | POA: Insufficient documentation

## 2016-09-24 DIAGNOSIS — M1990 Unspecified osteoarthritis, unspecified site: Secondary | ICD-10-CM | POA: Diagnosis not present

## 2016-09-24 DIAGNOSIS — Z7982 Long term (current) use of aspirin: Secondary | ICD-10-CM | POA: Insufficient documentation

## 2016-09-24 HISTORY — DX: Peripheral vascular disease, unspecified: I73.9

## 2016-09-24 HISTORY — DX: Dizziness and giddiness: R42

## 2016-09-24 HISTORY — PX: LIPOMA EXCISION: SHX5283

## 2016-09-24 HISTORY — DX: Nonrheumatic mitral (valve) insufficiency: I34.0

## 2016-09-24 HISTORY — DX: Malignant (primary) neoplasm, unspecified: C80.1

## 2016-09-24 HISTORY — DX: Gastro-esophageal reflux disease without esophagitis: K21.9

## 2016-09-24 SURGERY — EXCISION LIPOMA
Anesthesia: General | Site: Back

## 2016-09-24 MED ORDER — LIDOCAINE HCL (CARDIAC) 20 MG/ML IV SOLN
INTRAVENOUS | Status: DC | PRN
  Administered 2016-09-24: 60 mg via INTRAVENOUS

## 2016-09-24 MED ORDER — ONDANSETRON HCL 4 MG/2ML IJ SOLN
INTRAMUSCULAR | Status: DC | PRN
  Administered 2016-09-24: 4 mg via INTRAVENOUS

## 2016-09-24 MED ORDER — FENTANYL CITRATE (PF) 100 MCG/2ML IJ SOLN
INTRAMUSCULAR | Status: DC | PRN
  Administered 2016-09-24: 100 ug via INTRAVENOUS

## 2016-09-24 MED ORDER — TRAMADOL HCL 50 MG PO TABS: 50.0000 mg | ORAL_TABLET | Freq: Four times a day (QID) | ORAL | 0 refills | Status: DC | PRN

## 2016-09-24 MED ORDER — CHLORHEXIDINE GLUCONATE CLOTH 2 % EX PADS: 6.0000 | MEDICATED_PAD | Freq: Once | CUTANEOUS | Status: DC

## 2016-09-24 MED ORDER — EPHEDRINE SULFATE 50 MG/ML IJ SOLN
INTRAMUSCULAR | Status: DC | PRN
  Administered 2016-09-24 (×3): 5 mg via INTRAVENOUS

## 2016-09-24 MED ORDER — LACTATED RINGERS IV SOLN
INTRAVENOUS | Status: DC | PRN
  Administered 2016-09-24: 08:00:00 via INTRAVENOUS

## 2016-09-24 MED ORDER — BUPIVACAINE HCL (PF) 0.25 % IJ SOLN
INTRAMUSCULAR | Status: AC
  Filled 2016-09-24: qty 30

## 2016-09-24 MED ORDER — CEFAZOLIN SODIUM-DEXTROSE 2-4 GM/100ML-% IV SOLN
2.0000 g | INTRAVENOUS | Status: AC
  Administered 2016-09-24: 2 g via INTRAVENOUS
  Filled 2016-09-24: qty 100

## 2016-09-24 MED ORDER — PROMETHAZINE HCL 25 MG/ML IJ SOLN: 6.2500 mg | INTRAMUSCULAR | Status: DC | PRN

## 2016-09-24 MED ORDER — ONDANSETRON HCL 4 MG/2ML IJ SOLN
INTRAMUSCULAR | Status: AC
  Filled 2016-09-24: qty 2

## 2016-09-24 MED ORDER — SUCCINYLCHOLINE CHLORIDE 20 MG/ML IJ SOLN
INTRAMUSCULAR | Status: DC | PRN
  Administered 2016-09-24: 120 mg via INTRAVENOUS

## 2016-09-24 MED ORDER — FENTANYL CITRATE (PF) 100 MCG/2ML IJ SOLN
INTRAMUSCULAR | Status: AC
  Filled 2016-09-24: qty 2

## 2016-09-24 MED ORDER — EPHEDRINE 5 MG/ML INJ
INTRAVENOUS | Status: AC
  Filled 2016-09-24: qty 10

## 2016-09-24 MED ORDER — SUCCINYLCHOLINE CHLORIDE 200 MG/10ML IV SOSY
PREFILLED_SYRINGE | INTRAVENOUS | Status: AC
  Filled 2016-09-24: qty 10

## 2016-09-24 MED ORDER — FENTANYL CITRATE (PF) 100 MCG/2ML IJ SOLN: 25.0000 ug | INTRAMUSCULAR | Status: DC | PRN

## 2016-09-24 MED ORDER — LIDOCAINE 2% (20 MG/ML) 5 ML SYRINGE
INTRAMUSCULAR | Status: AC
  Filled 2016-09-24: qty 5

## 2016-09-24 MED ORDER — PROPOFOL 10 MG/ML IV BOLUS
INTRAVENOUS | Status: AC
  Filled 2016-09-24: qty 20

## 2016-09-24 MED ORDER — PROPOFOL 10 MG/ML IV BOLUS
INTRAVENOUS | Status: DC | PRN
  Administered 2016-09-24: 50 mg via INTRAVENOUS
  Administered 2016-09-24: 150 mg via INTRAVENOUS

## 2016-09-24 MED ORDER — 0.9 % SODIUM CHLORIDE (POUR BTL) OPTIME
TOPICAL | Status: DC | PRN
  Administered 2016-09-24: 1000 mL

## 2016-09-24 MED ORDER — BUPIVACAINE HCL (PF) 0.25 % IJ SOLN
INTRAMUSCULAR | Status: DC | PRN
  Administered 2016-09-24: 10 mL

## 2016-09-24 SURGICAL SUPPLY — 46 items
BENZOIN TINCTURE PRP APPL 2/3 (GAUZE/BANDAGES/DRESSINGS) ×2 IMPLANT
BLADE CLIPPER SURG (BLADE) ×2 IMPLANT
BLADE SURG 10 STRL SS (BLADE) ×2 IMPLANT
BLADE SURG 15 STRL LF DISP TIS (BLADE) ×1 IMPLANT
BLADE SURG 15 STRL SS (BLADE) ×1
CHLORAPREP W/TINT 26ML (MISCELLANEOUS) ×2 IMPLANT
COVER BACK TABLE 60X90IN (DRAPES) ×2 IMPLANT
COVER MAYO STAND STRL (DRAPES) ×2 IMPLANT
COVER SURGICAL LIGHT HANDLE (MISCELLANEOUS) ×2 IMPLANT
DRAPE LAPAROTOMY 100X72 PEDS (DRAPES) ×2 IMPLANT
DRAPE UTILITY XL STRL (DRAPES) ×2 IMPLANT
ELECT CAUTERY BLADE 6.4 (BLADE) ×2 IMPLANT
ELECT REM PT RETURN 9FT ADLT (ELECTROSURGICAL) ×2
ELECTRODE REM PT RTRN 9FT ADLT (ELECTROSURGICAL) ×1 IMPLANT
GAUZE SPONGE 4X4 12PLY STRL (GAUZE/BANDAGES/DRESSINGS) ×2 IMPLANT
GLOVE BIO SURGEON STRL SZ7 (GLOVE) ×4 IMPLANT
GLOVE BIOGEL PI IND STRL 7.0 (GLOVE) ×2 IMPLANT
GLOVE BIOGEL PI IND STRL 7.5 (GLOVE) ×1 IMPLANT
GLOVE BIOGEL PI INDICATOR 7.0 (GLOVE) ×2
GLOVE BIOGEL PI INDICATOR 7.5 (GLOVE) ×1
GLOVE ECLIPSE 6.5 STRL STRAW (GLOVE) ×2 IMPLANT
GLOVE SURG SS PI 6.0 STRL IVOR (GLOVE) ×2 IMPLANT
GOWN STRL REUS W/ TWL LRG LVL3 (GOWN DISPOSABLE) ×3 IMPLANT
GOWN STRL REUS W/TWL LRG LVL3 (GOWN DISPOSABLE) ×3
KIT BASIN OR (CUSTOM PROCEDURE TRAY) ×2 IMPLANT
KIT ROOM TURNOVER OR (KITS) ×2 IMPLANT
NEEDLE HYPO 25GX1X1/2 BEV (NEEDLE) ×2 IMPLANT
NS IRRIG 1000ML POUR BTL (IV SOLUTION) ×2 IMPLANT
PACK SURGICAL SETUP 50X90 (CUSTOM PROCEDURE TRAY) ×2 IMPLANT
PAD ARMBOARD 7.5X6 YLW CONV (MISCELLANEOUS) ×2 IMPLANT
PENCIL BUTTON HOLSTER BLD 10FT (ELECTRODE) ×2 IMPLANT
SPECIMEN JAR SMALL (MISCELLANEOUS) ×2 IMPLANT
SPONGE LAP 18X18 X RAY DECT (DISPOSABLE) ×2 IMPLANT
STRIP CLOSURE SKIN 1/2X4 (GAUZE/BANDAGES/DRESSINGS) ×2 IMPLANT
SUT MNCRL AB 4-0 PS2 18 (SUTURE) ×2 IMPLANT
SUT VIC AB 2-0 SH 27 (SUTURE)
SUT VIC AB 2-0 SH 27X BRD (SUTURE) IMPLANT
SUT VIC AB 3-0 SH 27 (SUTURE) ×1
SUT VIC AB 3-0 SH 27XBRD (SUTURE) ×1 IMPLANT
SYR BULB 3OZ (MISCELLANEOUS) ×2 IMPLANT
SYR CONTROL 10ML LL (SYRINGE) ×2 IMPLANT
TAPE CLOTH SURG 6X10 WHT LF (GAUZE/BANDAGES/DRESSINGS) ×2 IMPLANT
TOWEL OR 17X24 6PK STRL BLUE (TOWEL DISPOSABLE) ×2 IMPLANT
TOWEL OR 17X26 10 PK STRL BLUE (TOWEL DISPOSABLE) ×2 IMPLANT
TUBE CONNECTING 12X1/4 (SUCTIONS) ×2 IMPLANT
YANKAUER SUCT BULB TIP NO VENT (SUCTIONS) ×2 IMPLANT

## 2016-09-24 NOTE — H&P (View-Only) (Signed)
History of Present Illness [name] [name]. Shamiah Kahler MD; 08/29/2016 11:09 AM) The patient is a 81 year old male who presents with a complaint of Mass. Referred by Dr. Allyn Kenner for back lipoma PCP - Dr. Alyson Ingles GI - Dr. Benson [place] Cards - Dr. Einar Gip  This is a 81 year old male who presents with a 5 year history of an enlarging mass on his upper back near the base of his neck. This has become large and uncomfortable. Sometimes it causes headaches as well as pain radiating down into his right arm. Currently the patient is not on any blood thinners. He would like to have this mass removed.   Past Surgical History Benjiman Core, [place]; 08/29/2016 10:37 AM) Bypass Surgery for Poor Blood Flow to Legs Colon Polyp Removal - Colonoscopy Coronary Artery Bypass Graft Knee Surgery Bilateral. Prostate Surgery - Removal Shoulder Surgery Left.  Diagnostic Studies History Benjiman Core, CMA; 08/29/2016 10:37 AM) Colonoscopy 1-5 years ago  Allergies Benjiman Core, CMA; 08/29/2016 10:39 AM) HYDROCODONE Rash, Itching.  Medication History Benjiman Core, CMA; 08/29/2016 10:42 AM) Aspirin (81MG  Tablet, Oral) Active. AmLODIPine Besylate (5MG  Tablet, Oral) Active. Brilinta (90MG  Tablet, Oral) Active. Cetirizine HCl (10MG  Tablet, Oral) Active. Fluticasone Propionate (50MCG/ACT Suspension, Nasal) Active. Lisinopril (20MG  Tablet, Oral) Active. Metoprolol Tartrate (25MG  Tablet, Oral) Active. Pantoprazole Sodium (40MG  Tablet DR, Oral) Active. Praluent (75MG /ML Soln Pen-inj, Subcutaneous) Active. Pravastatin Sodium (80MG  Tablet, Oral) Active. Sucralfate (1GM Tablet, Oral) Active. Acetaminophen (650MG  Tablet, Oral) Active. Vitamin D (Cholecalciferol) (1000UNIT Capsule, Oral) Active. Zolpidem Tartrate (10MG  Tablet, Oral) Active. Medications Reconciled  Social History Benjiman Core, CMA; 08/29/2016 10:37 AM) Alcohol use Occasional alcohol use. No caffeine use No drug use Tobacco use Never  smoker.  Family History Benjiman Core, [place]; 08/29/2016 10:37 AM) Alcohol Abuse Family Members In General. Arthritis Mother, Sister. Heart Disease Father. Hypertension Father, Sister.  Other Problems Benjiman Core, CMA; 08/29/2016 10:37 AM) Arthritis Back Pain Chest pain Diverticulosis Enlarged Prostate Gastric Ulcer Gastroesophageal Reflux Disease High blood pressure Hypercholesterolemia Myocardial infarction Prostate Cancer Sleep Apnea     Review of Systems ([name] CMA; 08/29/2016 10:37 AM) General Not Present- Appetite Loss, Chills, Fatigue, Fever, Night Sweats, Weight Gain and Weight Loss. Skin Not Present- Change in Wart/Mole, Dryness, Hives, Jaundice, New Lesions, Non-Healing Wounds, Rash and Ulcer. HEENT Present- Hearing Loss, Seasonal Allergies and Wears glasses/contact lenses. Not Present- Earache, Hoarseness, Nose Bleed, Oral Ulcers, Ringing in the Ears, Sinus Pain, Sore Throat, Visual Disturbances and Yellow Eyes. Respiratory Not Present- Bloody sputum, Chronic Cough, Difficulty Breathing, Snoring and Wheezing. Cardiovascular Present- Chest Pain, Leg Cramps and Swelling of Extremities. Not Present- Difficulty Breathing Lying Down, Palpitations, Rapid Heart Rate and Shortness of Breath. Gastrointestinal Present- Abdominal Pain and Gets full quickly at meals. Not Present- Bloating, Bloody Stool, Change in Bowel Habits, Chronic diarrhea, Constipation, Difficulty Swallowing, Excessive gas, Hemorrhoids, Indigestion, Nausea, Rectal Pain and Vomiting. Male Genitourinary Present- Change in Urinary Stream, Frequency, Impotence and Urine Leakage. Not Present- Blood in Urine, Nocturia, Painful Urination and Urgency. Musculoskeletal Present- Joint Pain and Joint Stiffness. Not Present- Back Pain, Muscle Pain, Muscle Weakness and Swelling of Extremities. Neurological Present- Trouble walking. Not Present- Decreased Memory, Fainting, Headaches, Numbness, Seizures,  Tingling, Tremor and Weakness.  Vitals ([name] CMA; 08/29/2016 10:38 AM) 08/29/2016 10:37 AM Weight: 194.38 lb Height: 72in Body Surface Area: 2.1 m Body Mass Index: 26.36 kg/m  Temp.: 98.11F  Pulse: 54 (Regular)  P.OX: 97% (Room air) BP: 128/62 (Sitting, Left Arm, Standard)      Physical Exam Rodman Key  Oren Section MD; 08/29/2016 11:10 AM)  The physical exam findings are as follows: Note:WDWN in NAD Eyes: Pupils equal, round; sclera anicteric HENT: Oral mucosa moist; good dentition Neck: No masses palpated, no thyromegaly Lungs: CTA bilaterally; normal respiratory effort CV: Healed median sternotomy scar; Regular rate and rhythm; no murmurs; extremities well-perfused with no edema Abd: +bowel sounds, soft, non-tender, no palpable organomegaly; no palpable hernias Skin: Warm, dry; no sign of jaundice Back: Upper back near base of neck just to the right of midline - protruding 4 cm soft, smooth, well-demarcated mass Psychiatric - alert and oriented x 4; calm mood and affect    Assessment & Plan Rodman Key K. Jeniece Hannis MD; 08/29/2016 10:46 AM)  LIPOMA OF BACK (D17.1) Impression: 4 cm upper back; subcutaneous  Current Plans Schedule for Surgery - Excision of subcutaneous lipoma - upper back 4 cm. The surgical procedure has been discussed with the patient. Potential risks, benefits, alternative treatments, and expected outcomes have been explained. All of the patient's questions at this time have been answered. The likelihood of reaching the patient's treatment goal is good. The patient understand the proposed surgical procedure and wishes to proceed.  Prone position  Cardiac clearance first by Dr. Einar Gip.  [name] [name]. Georgette Dover, MD, [facility] Surgery  General/ Trauma Surgery  08/29/2016 11:11 AM

## 2016-09-24 NOTE — Op Note (Signed)
Pre-op Diagnosis:  Subcutaneous lipoma - upper back 4 cm Post-op Diagnosis:  Same Procedure:  Excision of subcutaneous lipoma - upper back  Surgeon: Trigg Delarocha K. Anesthesia:  GETT Indications: This is a 81 year old male who presents with a three-year history of a slowly enlarging mass on his upper back near the base of his neck. Initially ignored this area. However it has become larger and is beginning to cause headaches and localized discomfort. He presents now for excision.  Description of procedure: Patient brought to the operating room and placed in a supine position on the stretcher. After an adequate level general anesthesia was obtained, he was positioned in a prone position.  The area over the mass was prepped with ChloraPrep and draped sterile fashion. A timeout was taken to ensure the proper patient and proper procedure. We infiltrated the area over the mass with 0.25% Marcaine. Transverse incision was made. Dissection was carried down through the dermis to the surface lipoma. We dissect around the lower edge of the lipoma. We peeled the lipoma off the underlying muscle. We removed the entire lipoma. Cautery was used for hemostasis. The wound was irrigated and inspected again for hemostasis. There was no residual lipoma. The wound was closed with 3-0 Vicryl and 4-0 Monocryl. Benzoin Steri-Strips were applied. The patient was then moved back to supine position. He was extubated and brought to recovery room in stable condition. All sponge, instrument, and needle counts are correct.  Imogene Burn. Georgette Dover, MD, [facility] Surgery  General/ Trauma Surgery  09/24/2016 9:34 AM

## 2016-09-24 NOTE — Anesthesia Procedure Notes (Signed)
Procedure Name: Intubation Date/Time: 09/24/2016 8:37 AM Performed by: Manuela Schwartz B Pre-anesthesia Checklist: Patient identified, Emergency Drugs available, Suction available, Patient being monitored and Timeout performed Patient Re-evaluated:Patient Re-evaluated prior to inductionOxygen Delivery Method: Circle system utilized Preoxygenation: Pre-oxygenation with 100% oxygen Intubation Type: IV induction and Rapid sequence Laryngoscope Size: Mac and 4 Grade View: Grade I Tube type: Oral Tube size: 7.5 mm Number of attempts: 1 Airway Equipment and Method: Stylet Placement Confirmation: ETT inserted through vocal cords under direct vision,  positive ETCO2 and breath sounds checked- equal and bilateral Secured at: 24 cm Tube secured with: Tape Dental Injury: Teeth and Oropharynx as per pre-operative assessment

## 2016-09-24 NOTE — Anesthesia Postprocedure Evaluation (Signed)
Anesthesia Post Note  Patient: [name] [name]  Procedure(s) Performed: Procedure(s) (LRB): EXCISION SUBCUTANEOUS LIPOMA ON UPPER BACK (N/A)  Patient location during evaluation: PACU Anesthesia Type: General Level of consciousness: awake and alert Pain management: pain level controlled Vital Signs Assessment: post-procedure vital signs reviewed and stable Respiratory status: spontaneous breathing, nonlabored ventilation, respiratory function stable and patient connected to nasal cannula oxygen Cardiovascular status: blood pressure returned to baseline and stable Postop Assessment: no signs of nausea or vomiting Anesthetic complications: no       Last Vitals:  Vitals:   09/24/16 1010 09/24/16 1015  BP:    Pulse:  65  Resp:    Temp: 36.7 C     Last Pain:  Vitals:   09/24/16 1010  TempSrc:   PainSc: 0-No pain                 Tiajuana Amass

## 2016-09-24 NOTE — Transfer of Care (Signed)
Immediate Anesthesia Transfer of Care Note  Patient: [name] [name]  Procedure(s) Performed: Procedure(s): EXCISION SUBCUTANEOUS LIPOMA ON UPPER BACK (N/A)  Patient Location: PACU  Anesthesia Type:General  Level of Consciousness: awake, alert  and oriented  Airway & Oxygen Therapy: Patient Spontanous Breathing  Post-op Assessment: Report given to RN and Post -op Vital signs reviewed and stable  Post vital signs: Reviewed and stable  Last Vitals:  Vitals:   09/24/16 0715  BP: (!) 159/69  Pulse: (!) 56  Resp: 20  Temp: 37.2 C    Last Pain:  Vitals:   09/24/16 0715  TempSrc: Oral         Complications: No apparent anesthesia complications

## 2016-09-24 NOTE — Interval H&P Note (Signed)
History and Physical Interval Note:  09/24/2016 8:21 AM  [name] [name]  has presented today for surgery, with the diagnosis of Subcutaneous lipoma on upper back  The various methods of treatment have been discussed with the patient and family. After consideration of risks, benefits and other options for treatment, the patient has consented to  Procedure(s): EXCISION SUBCUTANEOUS LIPOMA ON UPPER BACK (N/A) as a surgical intervention .  The patient's history has been reviewed, patient examined, no change in status, stable for surgery.  I have reviewed the patient's chart and labs.  Questions were answered to the patient's satisfaction.     [name] K.

## 2016-09-24 NOTE — Discharge Instructions (Signed)
Ice pack as needed. Use pain meds as needed. Remove outer dressing in 48 hours, then you may shower over the steri-strips until they come off on their own.   No restrictions on activity or diet.

## 2016-09-25 ENCOUNTER — Encounter (HOSPITAL_COMMUNITY): Payer: Self-pay | Admitting: Surgery

## 2016-10-03 DIAGNOSIS — I25119 Atherosclerotic heart disease of native coronary artery with unspecified angina pectoris: Secondary | ICD-10-CM | POA: Diagnosis not present

## 2016-10-03 DIAGNOSIS — R0609 Other forms of dyspnea: Secondary | ICD-10-CM | POA: Diagnosis not present

## 2016-10-03 DIAGNOSIS — K222 Esophageal obstruction: Secondary | ICD-10-CM | POA: Diagnosis not present

## 2016-10-03 DIAGNOSIS — I10 Essential (primary) hypertension: Secondary | ICD-10-CM | POA: Diagnosis not present

## 2016-10-20 DIAGNOSIS — R1032 Left lower quadrant pain: Secondary | ICD-10-CM | POA: Diagnosis not present

## 2016-10-20 DIAGNOSIS — R1031 Right lower quadrant pain: Secondary | ICD-10-CM | POA: Diagnosis not present

## 2016-10-20 DIAGNOSIS — K219 Gastro-esophageal reflux disease without esophagitis: Secondary | ICD-10-CM | POA: Diagnosis not present

## 2016-10-20 DIAGNOSIS — R1310 Dysphagia, unspecified: Secondary | ICD-10-CM | POA: Diagnosis not present

## 2016-11-13 DIAGNOSIS — I129 Hypertensive chronic kidney disease with stage 1 through stage 4 chronic kidney disease, or unspecified chronic kidney disease: Secondary | ICD-10-CM | POA: Diagnosis not present

## 2016-11-13 DIAGNOSIS — E785 Hyperlipidemia, unspecified: Secondary | ICD-10-CM | POA: Diagnosis not present

## 2016-11-13 DIAGNOSIS — C61 Malignant neoplasm of prostate: Secondary | ICD-10-CM | POA: Diagnosis not present

## 2016-11-13 DIAGNOSIS — G629 Polyneuropathy, unspecified: Secondary | ICD-10-CM | POA: Diagnosis not present

## 2016-11-14 DIAGNOSIS — R0602 Shortness of breath: Secondary | ICD-10-CM | POA: Diagnosis not present

## 2016-11-14 DIAGNOSIS — G629 Polyneuropathy, unspecified: Secondary | ICD-10-CM | POA: Diagnosis not present

## 2016-11-14 DIAGNOSIS — E785 Hyperlipidemia, unspecified: Secondary | ICD-10-CM | POA: Diagnosis not present

## 2016-11-14 DIAGNOSIS — I129 Hypertensive chronic kidney disease with stage 1 through stage 4 chronic kidney disease, or unspecified chronic kidney disease: Secondary | ICD-10-CM | POA: Diagnosis not present

## 2016-11-24 DIAGNOSIS — R197 Diarrhea, unspecified: Secondary | ICD-10-CM | POA: Diagnosis not present

## 2016-12-03 DIAGNOSIS — I25700 Atherosclerosis of coronary artery bypass graft(s), unspecified, with unstable angina pectoris: Secondary | ICD-10-CM | POA: Diagnosis not present

## 2016-12-03 DIAGNOSIS — S76011A Strain of muscle, fascia and tendon of right hip, initial encounter: Secondary | ICD-10-CM | POA: Diagnosis not present

## 2016-12-03 DIAGNOSIS — M25551 Pain in right hip: Secondary | ICD-10-CM | POA: Diagnosis not present

## 2016-12-03 DIAGNOSIS — I10 Essential (primary) hypertension: Secondary | ICD-10-CM | POA: Diagnosis not present

## 2016-12-11 DIAGNOSIS — G629 Polyneuropathy, unspecified: Secondary | ICD-10-CM | POA: Diagnosis not present

## 2016-12-11 DIAGNOSIS — R001 Bradycardia, unspecified: Secondary | ICD-10-CM | POA: Diagnosis not present

## 2017-01-30 DIAGNOSIS — M7541 Impingement syndrome of right shoulder: Secondary | ICD-10-CM | POA: Diagnosis not present

## 2017-01-30 DIAGNOSIS — M25512 Pain in left shoulder: Secondary | ICD-10-CM | POA: Diagnosis not present

## 2017-02-04 DIAGNOSIS — H01024 Squamous blepharitis left upper eyelid: Secondary | ICD-10-CM | POA: Diagnosis not present

## 2017-02-04 DIAGNOSIS — H01022 Squamous blepharitis right lower eyelid: Secondary | ICD-10-CM | POA: Diagnosis not present

## 2017-02-04 DIAGNOSIS — H01021 Squamous blepharitis right upper eyelid: Secondary | ICD-10-CM | POA: Diagnosis not present

## 2017-02-04 DIAGNOSIS — G629 Polyneuropathy, unspecified: Secondary | ICD-10-CM | POA: Diagnosis not present

## 2017-02-04 DIAGNOSIS — H2513 Age-related nuclear cataract, bilateral: Secondary | ICD-10-CM | POA: Diagnosis not present

## 2017-02-04 DIAGNOSIS — H11823 Conjunctivochalasis, bilateral: Secondary | ICD-10-CM | POA: Diagnosis not present

## 2017-02-04 DIAGNOSIS — H01025 Squamous blepharitis left lower eyelid: Secondary | ICD-10-CM | POA: Diagnosis not present

## 2017-02-04 DIAGNOSIS — H10413 Chronic giant papillary conjunctivitis, bilateral: Secondary | ICD-10-CM | POA: Diagnosis not present

## 2017-02-04 DIAGNOSIS — Z0000 Encounter for general adult medical examination without abnormal findings: Secondary | ICD-10-CM | POA: Diagnosis not present

## 2017-02-04 DIAGNOSIS — I10 Essential (primary) hypertension: Secondary | ICD-10-CM | POA: Diagnosis not present

## 2017-02-04 DIAGNOSIS — H04123 Dry eye syndrome of bilateral lacrimal glands: Secondary | ICD-10-CM | POA: Diagnosis not present

## 2017-02-04 DIAGNOSIS — E785 Hyperlipidemia, unspecified: Secondary | ICD-10-CM | POA: Diagnosis not present

## 2017-02-04 DIAGNOSIS — H1851 Endothelial corneal dystrophy: Secondary | ICD-10-CM | POA: Diagnosis not present

## 2017-02-11 DIAGNOSIS — I25119 Atherosclerotic heart disease of native coronary artery with unspecified angina pectoris: Secondary | ICD-10-CM | POA: Diagnosis not present

## 2017-02-11 DIAGNOSIS — R0609 Other forms of dyspnea: Secondary | ICD-10-CM | POA: Diagnosis not present

## 2017-02-11 DIAGNOSIS — I10 Essential (primary) hypertension: Secondary | ICD-10-CM | POA: Diagnosis not present

## 2017-02-11 DIAGNOSIS — K222 Esophageal obstruction: Secondary | ICD-10-CM | POA: Diagnosis not present

## 2017-02-16 DIAGNOSIS — N393 Stress incontinence (female) (male): Secondary | ICD-10-CM | POA: Diagnosis not present

## 2017-02-16 DIAGNOSIS — C61 Malignant neoplasm of prostate: Secondary | ICD-10-CM | POA: Diagnosis not present

## 2017-02-26 DIAGNOSIS — M25562 Pain in left knee: Secondary | ICD-10-CM | POA: Diagnosis not present

## 2017-02-26 DIAGNOSIS — G8929 Other chronic pain: Secondary | ICD-10-CM | POA: Diagnosis not present

## 2017-02-26 DIAGNOSIS — M7542 Impingement syndrome of left shoulder: Secondary | ICD-10-CM | POA: Diagnosis not present

## 2017-02-26 DIAGNOSIS — M170 Bilateral primary osteoarthritis of knee: Secondary | ICD-10-CM | POA: Diagnosis not present

## 2017-02-26 DIAGNOSIS — M25561 Pain in right knee: Secondary | ICD-10-CM | POA: Diagnosis not present

## 2017-03-10 DIAGNOSIS — I10 Essential (primary) hypertension: Secondary | ICD-10-CM | POA: Diagnosis not present

## 2017-03-10 DIAGNOSIS — G629 Polyneuropathy, unspecified: Secondary | ICD-10-CM | POA: Diagnosis not present

## 2017-03-10 DIAGNOSIS — T148XXA Other injury of unspecified body region, initial encounter: Secondary | ICD-10-CM | POA: Diagnosis not present

## 2017-04-07 DIAGNOSIS — H01022 Squamous blepharitis right lower eyelid: Secondary | ICD-10-CM | POA: Diagnosis not present

## 2017-04-07 DIAGNOSIS — H04123 Dry eye syndrome of bilateral lacrimal glands: Secondary | ICD-10-CM | POA: Diagnosis not present

## 2017-04-07 DIAGNOSIS — H01021 Squamous blepharitis right upper eyelid: Secondary | ICD-10-CM | POA: Diagnosis not present

## 2017-04-07 DIAGNOSIS — H01025 Squamous blepharitis left lower eyelid: Secondary | ICD-10-CM | POA: Diagnosis not present

## 2017-04-07 DIAGNOSIS — H01024 Squamous blepharitis left upper eyelid: Secondary | ICD-10-CM | POA: Diagnosis not present

## 2017-04-07 DIAGNOSIS — H10413 Chronic giant papillary conjunctivitis, bilateral: Secondary | ICD-10-CM | POA: Diagnosis not present

## 2017-04-07 DIAGNOSIS — H43811 Vitreous degeneration, right eye: Secondary | ICD-10-CM | POA: Diagnosis not present

## 2017-04-07 DIAGNOSIS — H2513 Age-related nuclear cataract, bilateral: Secondary | ICD-10-CM | POA: Diagnosis not present

## 2017-04-07 DIAGNOSIS — H40013 Open angle with borderline findings, low risk, bilateral: Secondary | ICD-10-CM | POA: Diagnosis not present

## 2017-04-21 DIAGNOSIS — G629 Polyneuropathy, unspecified: Secondary | ICD-10-CM | POA: Diagnosis not present

## 2017-04-23 DIAGNOSIS — C61 Malignant neoplasm of prostate: Secondary | ICD-10-CM | POA: Insufficient documentation

## 2017-04-23 HISTORY — DX: Malignant neoplasm of prostate: C61

## 2017-05-19 DIAGNOSIS — H40013 Open angle with borderline findings, low risk, bilateral: Secondary | ICD-10-CM | POA: Diagnosis not present

## 2017-05-19 DIAGNOSIS — H10413 Chronic giant papillary conjunctivitis, bilateral: Secondary | ICD-10-CM | POA: Diagnosis not present

## 2017-05-19 DIAGNOSIS — H01021 Squamous blepharitis right upper eyelid: Secondary | ICD-10-CM | POA: Diagnosis not present

## 2017-05-19 DIAGNOSIS — H01022 Squamous blepharitis right lower eyelid: Secondary | ICD-10-CM | POA: Diagnosis not present

## 2017-05-19 DIAGNOSIS — H01024 Squamous blepharitis left upper eyelid: Secondary | ICD-10-CM | POA: Diagnosis not present

## 2017-05-19 DIAGNOSIS — H43811 Vitreous degeneration, right eye: Secondary | ICD-10-CM | POA: Diagnosis not present

## 2017-05-19 DIAGNOSIS — H2513 Age-related nuclear cataract, bilateral: Secondary | ICD-10-CM | POA: Diagnosis not present

## 2017-05-19 DIAGNOSIS — H01025 Squamous blepharitis left lower eyelid: Secondary | ICD-10-CM | POA: Diagnosis not present

## 2017-05-19 DIAGNOSIS — H04123 Dry eye syndrome of bilateral lacrimal glands: Secondary | ICD-10-CM | POA: Diagnosis not present

## 2017-05-20 DIAGNOSIS — I10 Essential (primary) hypertension: Secondary | ICD-10-CM | POA: Diagnosis not present

## 2017-05-20 DIAGNOSIS — E7800 Pure hypercholesterolemia, unspecified: Secondary | ICD-10-CM | POA: Diagnosis not present

## 2017-05-20 DIAGNOSIS — R51 Headache: Secondary | ICD-10-CM | POA: Diagnosis not present

## 2017-05-26 DIAGNOSIS — I10 Essential (primary) hypertension: Secondary | ICD-10-CM | POA: Diagnosis not present

## 2017-05-26 DIAGNOSIS — Z125 Encounter for screening for malignant neoplasm of prostate: Secondary | ICD-10-CM | POA: Diagnosis not present

## 2017-05-26 DIAGNOSIS — G629 Polyneuropathy, unspecified: Secondary | ICD-10-CM | POA: Diagnosis not present

## 2017-05-26 DIAGNOSIS — Z0000 Encounter for general adult medical examination without abnormal findings: Secondary | ICD-10-CM | POA: Diagnosis not present

## 2017-06-10 DIAGNOSIS — C61 Malignant neoplasm of prostate: Secondary | ICD-10-CM | POA: Diagnosis not present

## 2017-06-10 DIAGNOSIS — G629 Polyneuropathy, unspecified: Secondary | ICD-10-CM | POA: Diagnosis not present

## 2017-06-10 DIAGNOSIS — Z0000 Encounter for general adult medical examination without abnormal findings: Secondary | ICD-10-CM | POA: Diagnosis not present

## 2017-06-10 DIAGNOSIS — E559 Vitamin D deficiency, unspecified: Secondary | ICD-10-CM | POA: Diagnosis not present

## 2017-06-10 DIAGNOSIS — I129 Hypertensive chronic kidney disease with stage 1 through stage 4 chronic kidney disease, or unspecified chronic kidney disease: Secondary | ICD-10-CM | POA: Diagnosis not present

## 2017-06-10 DIAGNOSIS — E785 Hyperlipidemia, unspecified: Secondary | ICD-10-CM | POA: Diagnosis not present

## 2017-06-15 DIAGNOSIS — N393 Stress incontinence (female) (male): Secondary | ICD-10-CM | POA: Insufficient documentation

## 2017-06-15 DIAGNOSIS — Z8546 Personal history of malignant neoplasm of prostate: Secondary | ICD-10-CM | POA: Diagnosis not present

## 2017-07-01 DIAGNOSIS — H40013 Open angle with borderline findings, low risk, bilateral: Secondary | ICD-10-CM | POA: Diagnosis not present

## 2017-07-01 DIAGNOSIS — H04123 Dry eye syndrome of bilateral lacrimal glands: Secondary | ICD-10-CM | POA: Diagnosis not present

## 2017-07-01 DIAGNOSIS — I25119 Atherosclerotic heart disease of native coronary artery with unspecified angina pectoris: Secondary | ICD-10-CM | POA: Diagnosis not present

## 2017-07-01 DIAGNOSIS — F5104 Psychophysiologic insomnia: Secondary | ICD-10-CM | POA: Diagnosis not present

## 2017-07-01 DIAGNOSIS — H2513 Age-related nuclear cataract, bilateral: Secondary | ICD-10-CM | POA: Diagnosis not present

## 2017-07-01 DIAGNOSIS — H43811 Vitreous degeneration, right eye: Secondary | ICD-10-CM | POA: Diagnosis not present

## 2017-07-01 DIAGNOSIS — H10413 Chronic giant papillary conjunctivitis, bilateral: Secondary | ICD-10-CM | POA: Diagnosis not present

## 2017-07-01 DIAGNOSIS — K222 Esophageal obstruction: Secondary | ICD-10-CM | POA: Diagnosis not present

## 2017-07-01 DIAGNOSIS — H01024 Squamous blepharitis left upper eyelid: Secondary | ICD-10-CM | POA: Diagnosis not present

## 2017-07-01 DIAGNOSIS — H01025 Squamous blepharitis left lower eyelid: Secondary | ICD-10-CM | POA: Diagnosis not present

## 2017-07-01 DIAGNOSIS — H01021 Squamous blepharitis right upper eyelid: Secondary | ICD-10-CM | POA: Diagnosis not present

## 2017-07-01 DIAGNOSIS — E7800 Pure hypercholesterolemia, unspecified: Secondary | ICD-10-CM | POA: Diagnosis not present

## 2017-07-01 DIAGNOSIS — H01022 Squamous blepharitis right lower eyelid: Secondary | ICD-10-CM | POA: Diagnosis not present

## 2017-07-16 DIAGNOSIS — M25512 Pain in left shoulder: Secondary | ICD-10-CM | POA: Diagnosis not present

## 2017-07-22 DIAGNOSIS — E538 Deficiency of other specified B group vitamins: Secondary | ICD-10-CM | POA: Diagnosis not present

## 2017-07-22 DIAGNOSIS — K921 Melena: Secondary | ICD-10-CM | POA: Diagnosis not present

## 2017-07-22 DIAGNOSIS — M25512 Pain in left shoulder: Secondary | ICD-10-CM | POA: Diagnosis not present

## 2017-07-22 DIAGNOSIS — R197 Diarrhea, unspecified: Secondary | ICD-10-CM | POA: Diagnosis not present

## 2017-07-22 DIAGNOSIS — M75122 Complete rotator cuff tear or rupture of left shoulder, not specified as traumatic: Secondary | ICD-10-CM | POA: Diagnosis not present

## 2017-07-22 DIAGNOSIS — R1013 Epigastric pain: Secondary | ICD-10-CM | POA: Diagnosis not present

## 2017-07-23 DIAGNOSIS — R197 Diarrhea, unspecified: Secondary | ICD-10-CM | POA: Diagnosis not present

## 2017-08-04 DIAGNOSIS — H8109 Meniere's disease, unspecified ear: Secondary | ICD-10-CM | POA: Insufficient documentation

## 2017-08-04 DIAGNOSIS — M19012 Primary osteoarthritis, left shoulder: Secondary | ICD-10-CM | POA: Diagnosis not present

## 2017-08-04 DIAGNOSIS — M75122 Complete rotator cuff tear or rupture of left shoulder, not specified as traumatic: Secondary | ICD-10-CM | POA: Diagnosis not present

## 2017-08-04 DIAGNOSIS — M75102 Unspecified rotator cuff tear or rupture of left shoulder, not specified as traumatic: Secondary | ICD-10-CM | POA: Insufficient documentation

## 2017-08-25 DIAGNOSIS — E538 Deficiency of other specified B group vitamins: Secondary | ICD-10-CM | POA: Diagnosis not present

## 2017-08-26 DIAGNOSIS — Z1283 Encounter for screening for malignant neoplasm of skin: Secondary | ICD-10-CM | POA: Diagnosis not present

## 2017-08-26 DIAGNOSIS — L821 Other seborrheic keratosis: Secondary | ICD-10-CM | POA: Diagnosis not present

## 2017-08-26 DIAGNOSIS — L570 Actinic keratosis: Secondary | ICD-10-CM | POA: Diagnosis not present

## 2017-08-26 DIAGNOSIS — X32XXXD Exposure to sunlight, subsequent encounter: Secondary | ICD-10-CM | POA: Diagnosis not present

## 2017-08-27 DIAGNOSIS — M25512 Pain in left shoulder: Secondary | ICD-10-CM | POA: Diagnosis not present

## 2017-08-27 DIAGNOSIS — J0110 Acute frontal sinusitis, unspecified: Secondary | ICD-10-CM | POA: Diagnosis not present

## 2017-09-01 NOTE — H&P (Signed)
[name] [name] is an 82 y.o. male.    Chief Complaint: left shoulder pain  HPI: Pt is a 82 y.o. male complaining of left shoulder pain for multiple years. Pain had continually increased since the beginning. X-rays in the clinic show end-stage arthritic changes of the left shoulder. Pt has tried various conservative treatments which have failed to alleviate their symptoms, including injections and therapy. Various options are discussed with the patient. Risks, benefits and expectations were discussed with the patient. Patient understand the risks, benefits and expectations and wishes to proceed with surgery.   PCP:  Thressa Sheller, MD  D/C Plans: Home  PMH: Past Medical History:  Diagnosis Date  . Arthritis   . Cancer [facility]) 1995   prostate ca   . Esophageal stricture 08/2016  . GERD (gastroesophageal reflux disease)   . Hypertension   . MI (mitral incompetence)   . Peripheral vascular disease ([place])   . Vertigo     PSH: Past Surgical History:  Procedure Laterality Date  . COLONOSCOPY    . CORONARY ANGIOPLASTY WITH STENT PLACEMENT  2005   x9   . CORONARY ARTERY BYPASS GRAFT  2011  . CORONARY STENT PLACEMENT  07/2014  . ESOPHAGOGASTRODUODENOSCOPY    . ESOPHAGOGASTRODUODENOSCOPY (EGD) WITH ESOPHAGEAL DILATION  2018  . knee scope Bilateral   . LIPOMA EXCISION N/A 09/24/2016   Procedure: EXCISION SUBCUTANEOUS LIPOMA ON UPPER BACK;  Surgeon: Donnie Mesa, MD;  Location: [place];  Service: General;  Laterality: N/A;  . Prostectomy  1995  . ROTATOR CUFF REPAIR Left 2001    Social History:  reports that  has never smoked. he has never used smokeless tobacco. He reports that he does not drink alcohol or use drugs.  Allergies:  Allergies  Allergen Reactions  . Hydrocodone Rash    Medications: No current facility-administered medications for this encounter.    Current Outpatient Medications  Medication Sig Dispense Refill  . acetaminophen (TYLENOL) 650 MG CR tablet Take  1,300 mg by mouth 2 (two) times daily.     . Alirocumab (PRALUENT) 75 MG/ML SOSY Inject 75 mg into the skin every 14 (fourteen) days.     Marland Kitchen amLODipine (NORVASC) 5 MG tablet Take 5 mg by mouth daily.    Marland Kitchen aspirin EC 81 MG tablet Take 81 mg by mouth daily.    . calcium carbonate (TUMS - DOSED IN MG ELEMENTAL CALCIUM) 500 MG chewable tablet Chew 2 tablets by mouth daily as needed for indigestion or heartburn.    . cetirizine (ZYRTEC) 10 MG tablet Take 10 mg by mouth daily.    . cholecalciferol (VITAMIN D) 1000 units tablet Take 1,000 Units by mouth daily.    . fluticasone (FLONASE) 50 MCG/ACT nasal spray Place 1 spray into both nostrils daily.    . Infant Care Products (BABY SHAMPOO EX) Rub into a wash cloth and rub into the eyes 3 to 4 times daily for dry eyes / eye build up    . lisinopril (PRINIVIL,ZESTRIL) 20 MG tablet Take 20 mg by mouth 2 (two) times daily.     . metoprolol tartrate (LOPRESSOR) 25 MG tablet Take 25 mg by mouth 2 (two) times daily.    . nitroGLYCERIN (NITROSTAT) 0.4 MG SL tablet Place 0.4 mg under the tongue every 5 (five) minutes as needed for chest pain.    . pantoprazole (PROTONIX) 40 MG tablet Take 40 mg by mouth 2 (two) times daily.    Vladimir Faster Glycol-Propyl Glycol (SYSTANE  OP) Apply 1 drop to eye 5 (five) times daily as needed (dry eyes).    . pravastatin (PRAVACHOL) 80 MG tablet Take 80 mg by mouth at bedtime.     . sucralfate (CARAFATE) 1 g tablet Take 1 g by mouth 4 (four) times daily -  with meals and at bedtime.     . traMADol (ULTRAM) 50 MG tablet Take 1-2 tablets (50-100 mg total) by mouth every 6 (six) hours as needed for moderate pain or severe pain. 12 tablet 0  . zolpidem (AMBIEN) 10 MG tablet Take 10 mg by mouth at bedtime.       No results found for this or any previous visit (from the past 48 hour(s)). No results found.  ROS: Pain with rom of the left upper extremity  Physical Exam:  Alert and oriented 82 y.o. male in no acute distress Cranial  nerves 2-12 intact Cervical spine: full rom with no tenderness, nv intact distally Chest: active breath sounds bilaterally, no wheeze rhonchi or rales Heart: regular rate and rhythm, no murmur Abd: non tender non distended with active bowel sounds Hip is stable with rom  Left shoulder with limited rom and strength due to arthropathy nv intact distally No rashes or edema   Assessment/Plan Assessment: left shoulder cuff arthropathy and deltoid atrophy  Plan: Patient will undergo a left reverse total shoulder and deltoid repair by Dr. Veverly Fells at [facility]. Risks benefits and expectations were discussed with the patient. Patient understand risks, benefits and expectations and wishes to proceed.  Merla Riches PA-C, MPAS [facility] is now [employer] [address] [place], [place], [place] [address] Phone: [phone] [email or web address] Facebook  [employer]

## 2017-09-15 NOTE — Pre-Procedure Instructions (Signed)
[name] [name]  09/15/2017      [id number] - , [place]. [place] [address] Phone: [phone] Fax: [phone]  [facility], [address] AT [place] & [place] [address] [place] [address] Phone: [phone] Fax: [phone]    Your procedure is scheduled on September 25, 2017.  Report to [facility] Admitting at 530 AM  Call this number if you have problems the morning of surgery:  [phone]   Remember:  Do not eat food or drink liquids after midnight.  Take these medicines the morning of surgery with A SIP OF WATER metoprolol (lopressor), tylenol-if needed, amlodipine (norvasc), eye drops-if needed, nasal spray-if needed, cetirizine (zyrtec), pantoprazole (protonix), sucralfate (carafate).  7 days prior to surgery STOP taking any Aspirin (unless otherwise instructed by your surgeon), Aleve, Naproxen, Ibuprofen, Motrin, Advil, Goody's, BC's, all herbal medications, fish oil, and all vitamins  Continue all other medications as instructed by your physician except follow the above medication instructions before surgery   Do not wear jewelry, make-up or nail polish.  Do not wear lotions, powders, or perfumes, or deodorant.             Men may shave face and neck.  Do not bring valuables to the hospital.  [facility] is not responsible for any belongings or valuables.  Contacts, dentures or bridgework may not be worn into surgery.  Leave your suitcase in the car.  After surgery it may be brought to your room.  For patients admitted to the hospital, discharge time will be determined by your treatment team.  Patients discharged the day of surgery will not be allowed to drive home.   Special instructions:  [place]- Preparing For Surgery  Before surgery, you can play an important role. Because skin is not sterile, your  skin needs to be as free of germs as possible. You can reduce the number of germs on your skin by washing with CHG (chlorahexidine gluconate) Soap before surgery.  CHG is an antiseptic cleaner which kills germs and bonds with the skin to continue killing germs even after washing.  Please do not use if you have an allergy to CHG or antibacterial soaps. If your skin becomes reddened/irritated stop using the CHG.  Do not shave (including legs and underarms) for at least 48 hours prior to first CHG shower. It is OK to shave your face.  Please follow these instructions carefully.   1. Shower the NIGHT BEFORE SURGERY and the MORNING OF SURGERY with CHG.   2. If you chose to wash your hair, wash your hair first as usual with your normal shampoo.  3. After you shampoo, rinse your hair and body thoroughly to remove the shampoo.  4. Use CHG as you would any other liquid soap. You can apply CHG directly to the skin and wash gently with a scrungie or a clean washcloth.   5. Apply the CHG Soap to your body ONLY FROM THE NECK DOWN.  Do not use on open wounds or open sores. Avoid contact with your eyes, ears, mouth and genitals (private parts). Wash Face and genitals (private parts)  with your normal soap.  6. Wash thoroughly, paying special attention to the area where your surgery will be performed.  7. Thoroughly rinse your body with warm water from the neck down.  8. DO NOT shower/wash with your  normal soap after using and rinsing off the CHG Soap.  9. Pat yourself dry with a CLEAN TOWEL.  10. Wear CLEAN PAJAMAS to bed the night before surgery, wear comfortable clothes the morning of surgery  11. Place CLEAN SHEETS on your bed the night of your first shower and DO NOT SLEEP WITH PETS.  Day of Surgery: Do not apply any deodorants/lotions. Please wear clean clothes to the hospital/surgery center.    Please read over the following fact sheets that you were given. Pain Booklet, Coughing and Deep  Breathing and Surgical Site Infection Prevention

## 2017-09-16 ENCOUNTER — Encounter (HOSPITAL_COMMUNITY)
Admission: RE | Admit: 2017-09-16 | Discharge: 2017-09-16 | Disposition: A | Discharge status: Home / Self Care | Payer: Medicare HMO | Source: Ambulatory Visit | Attending: Orthopedic Surgery | Admitting: Orthopedic Surgery

## 2017-09-16 ENCOUNTER — Other Ambulatory Visit: Payer: Self-pay

## 2017-09-16 ENCOUNTER — Encounter (HOSPITAL_COMMUNITY): Payer: Self-pay

## 2017-09-16 DIAGNOSIS — Z8546 Personal history of malignant neoplasm of prostate: Secondary | ICD-10-CM | POA: Insufficient documentation

## 2017-09-16 DIAGNOSIS — E785 Hyperlipidemia, unspecified: Secondary | ICD-10-CM | POA: Diagnosis not present

## 2017-09-16 DIAGNOSIS — Z951 Presence of aortocoronary bypass graft: Secondary | ICD-10-CM | POA: Insufficient documentation

## 2017-09-16 DIAGNOSIS — Z01818 Encounter for other preprocedural examination: Secondary | ICD-10-CM | POA: Insufficient documentation

## 2017-09-16 DIAGNOSIS — I2510 Atherosclerotic heart disease of native coronary artery without angina pectoris: Secondary | ICD-10-CM | POA: Diagnosis not present

## 2017-09-16 DIAGNOSIS — I10 Essential (primary) hypertension: Secondary | ICD-10-CM | POA: Insufficient documentation

## 2017-09-16 DIAGNOSIS — Z01812 Encounter for preprocedural laboratory examination: Secondary | ICD-10-CM | POA: Insufficient documentation

## 2017-09-16 DIAGNOSIS — K219 Gastro-esophageal reflux disease without esophagitis: Secondary | ICD-10-CM | POA: Insufficient documentation

## 2017-09-16 DIAGNOSIS — Z7982 Long term (current) use of aspirin: Secondary | ICD-10-CM | POA: Insufficient documentation

## 2017-09-16 DIAGNOSIS — H409 Unspecified glaucoma: Secondary | ICD-10-CM | POA: Insufficient documentation

## 2017-09-16 DIAGNOSIS — Z79899 Other long term (current) drug therapy: Secondary | ICD-10-CM | POA: Diagnosis not present

## 2017-09-16 DIAGNOSIS — Z955 Presence of coronary angioplasty implant and graft: Secondary | ICD-10-CM | POA: Insufficient documentation

## 2017-09-16 HISTORY — DX: Polyneuropathy, unspecified: G62.9

## 2017-09-16 HISTORY — DX: Unspecified glaucoma: H40.9

## 2017-09-16 HISTORY — DX: Personal history of other diseases of the digestive system: Z87.19

## 2017-09-16 LAB — CBC
HEMATOCRIT: 40.8 % (ref 39.0–52.0)
Hemoglobin: 13.7 g/dL (ref 13.0–17.0)
MCH: 30.3 pg (ref 26.0–34.0)
MCHC: 33.6 g/dL (ref 30.0–36.0)
MCV: 90.3 fL (ref 78.0–100.0)
Platelets: 156 10*3/uL (ref 150–400)
RBC: 4.52 MIL/uL (ref 4.22–5.81)
RDW: 12.6 % (ref 11.5–15.5)
WBC: 5 10*3/uL (ref 4.0–10.5)

## 2017-09-16 LAB — BASIC METABOLIC PANEL
Anion gap: 12 (ref 5–15)
BUN: 13 mg/dL (ref 6–20)
CO2: 21 mmol/L — AB (ref 22–32)
Calcium: 9 mg/dL (ref 8.9–10.3)
Chloride: 104 mmol/L (ref 101–111)
Creatinine, Ser: 0.84 mg/dL (ref 0.61–1.24)
GFR calc non Af Amer: >60 mL/min (ref >60)
Glucose, Bld: 121 mg/dL — ABNORMAL HIGH (ref 65–99)
POTASSIUM: 4.2 mmol/L (ref 3.5–5.1)
SODIUM: 137 mmol/L (ref 135–145)

## 2017-09-16 LAB — SURGICAL PCR SCREEN
MRSA, PCR: NEGATIVE
Staphylococcus aureus: NEGATIVE

## 2017-09-16 NOTE — Progress Notes (Addendum)
PCP - Dr. Jani Gravel  Cardiologist - Dr. Rexene Alberts clearance note  Chest x-ray - Denies  EKG - 09/16/17  Stress Test - 07/28/16 (CE)  ECHO - Denies  Cardiac Cath/CABG - 2016 & 2011  Sleep Study - No CPAP - None  LABS- 09/16/17: CBC, BMP  Anesthesia- Yes  Pt denies having chest pain, sob, or fever at this time. All instructions explained to the pt, with a verbal understanding of the material. Pt agrees to go over the instructions while at home for a better understanding. The opportunity to ask questions was provided.

## 2017-09-16 NOTE — Progress Notes (Signed)
Spoke with Dr. Veverly Fells' office regarding Aspirin regimen prior to surgery, per the office, they will contact the pt to inform him of the protocol.

## 2017-09-17 NOTE — Progress Notes (Signed)
Anesthesia Chart Review:  Pt is an 82 year old male scheduled for left reverse shoulder arthroplasty, deltoid repair on 09/25/2017 with Halford Chessman, MD  - PCP is Thressa Sheller, MD - Cardiologist is Kela Millin, MD who cleared patient for surgery.  Last office visit 07/01/17  PMH includes:  CAD (s/p CABG x2 2010 and stent to SVG to D2 2014 and 2017, all in [place]), HTN, hyperlipidemia, glaucoma, prostate cancer, GERD.  Never smoker.  BMI 27.  S/p lipoma excision 09/24/16   Medications include: praluent, amlodipine, ASA 81mg , lisinopril, metoprolol, protonix  EKG 09/16/17: Sinus bradycardia (56 bpm) with 1st degree A-V block. Low voltage QRS. Septal infarct, age undetermined. Inferior infarct, age undetermined. No significant change since prior EKG 09/23/16  Cardiac cath 07/29/16 (care everywhere):  1. 2 vessel coronary artery disease status post CABG surgery. LAD is occluded at the junction of proximal and midsegment. The first diagonal is occluded at its origin.  2. LIMA to LAD is patent however LAD beyond the touchdown is small and has diffuse disease.  3. SVG to diagonal is patent there are previous stents showing mild instent restenosis with maximum of 40-50% with TIMI-3 flow.  4. normal left ventricular end-diastolic pressure and no gradient across the aortic valve.  Echo 02/06/16 [facility] cardiovascular): 1. LV cavity normal in size. Mild LVH. Mild basal septal hypertrophy. Normal global wall motion. Normal diastolic filling pattern. Calculated EF 69%. 2. LA cavity moderately dilated at 4.7 cm. 3. Mild aortic regurgitation. 4. Mild to moderate mitral regurgitation. 5. Mild to moderate tricuspid regurgitation. 6. Borderline elevation in PA pressure at 30 mmHg suggest mild pulmonary hypertension.  7. IVC is dilated with respiratory variation. This may suggest elevated right heart pressure.  If no changes, I anticipate pt can proceed with surgery as scheduled.   Willeen Cass, FNP-BC [facility]/Anesthesiology Phone: [phone] 09/17/2017 2:26 PM

## 2017-09-22 DIAGNOSIS — E538 Deficiency of other specified B group vitamins: Secondary | ICD-10-CM | POA: Diagnosis not present

## 2017-09-23 DIAGNOSIS — H40013 Open angle with borderline findings, low risk, bilateral: Secondary | ICD-10-CM | POA: Diagnosis not present

## 2017-09-23 DIAGNOSIS — H2513 Age-related nuclear cataract, bilateral: Secondary | ICD-10-CM | POA: Diagnosis not present

## 2017-09-23 DIAGNOSIS — H04123 Dry eye syndrome of bilateral lacrimal glands: Secondary | ICD-10-CM | POA: Diagnosis not present

## 2017-09-23 DIAGNOSIS — H01022 Squamous blepharitis right lower eyelid: Secondary | ICD-10-CM | POA: Diagnosis not present

## 2017-09-23 DIAGNOSIS — H1851 Endothelial corneal dystrophy: Secondary | ICD-10-CM | POA: Diagnosis not present

## 2017-09-23 DIAGNOSIS — H16223 Keratoconjunctivitis sicca, not specified as Sjogren's, bilateral: Secondary | ICD-10-CM | POA: Diagnosis not present

## 2017-09-23 DIAGNOSIS — H10413 Chronic giant papillary conjunctivitis, bilateral: Secondary | ICD-10-CM | POA: Diagnosis not present

## 2017-09-23 DIAGNOSIS — H43811 Vitreous degeneration, right eye: Secondary | ICD-10-CM | POA: Diagnosis not present

## 2017-09-23 DIAGNOSIS — H01021 Squamous blepharitis right upper eyelid: Secondary | ICD-10-CM | POA: Diagnosis not present

## 2017-09-24 DIAGNOSIS — M546 Pain in thoracic spine: Secondary | ICD-10-CM | POA: Diagnosis not present

## 2017-09-24 DIAGNOSIS — R109 Unspecified abdominal pain: Secondary | ICD-10-CM | POA: Diagnosis not present

## 2017-09-24 MED ORDER — CEFAZOLIN SODIUM-DEXTROSE 2-4 GM/100ML-% IV SOLN
2.0000 g | INTRAVENOUS | Status: AC
  Administered 2017-09-25: 2 g via INTRAVENOUS
  Filled 2017-09-24: qty 100

## 2017-09-25 ENCOUNTER — Inpatient Hospital Stay (HOSPITAL_COMMUNITY): Payer: Medicare HMO | Admitting: Anesthesiology

## 2017-09-25 ENCOUNTER — Inpatient Hospital Stay (HOSPITAL_COMMUNITY)
Admission: RE | Admit: 2017-09-25 | Discharge: 2017-09-29 | DRG: 483 | Disposition: A | Discharge status: Skilled Nursing Facility | Payer: Medicare HMO | Attending: Orthopedic Surgery | Admitting: Orthopedic Surgery

## 2017-09-25 ENCOUNTER — Encounter (HOSPITAL_COMMUNITY): Payer: Self-pay | Admitting: *Deleted

## 2017-09-25 ENCOUNTER — Inpatient Hospital Stay (HOSPITAL_COMMUNITY): Payer: Medicare HMO

## 2017-09-25 ENCOUNTER — Encounter (HOSPITAL_COMMUNITY)
Admission: RE | Disposition: A | Discharge status: Skilled Nursing Facility | Payer: Self-pay | Source: Home / Self Care | Attending: Orthopedic Surgery

## 2017-09-25 ENCOUNTER — Inpatient Hospital Stay (HOSPITAL_COMMUNITY): Payer: Medicare HMO | Admitting: Emergency Medicine

## 2017-09-25 ENCOUNTER — Other Ambulatory Visit: Payer: Self-pay

## 2017-09-25 DIAGNOSIS — S46812A Strain of other muscles, fascia and tendons at shoulder and upper arm level, left arm, initial encounter: Secondary | ICD-10-CM | POA: Diagnosis not present

## 2017-09-25 DIAGNOSIS — M25812 Other specified joint disorders, left shoulder: Secondary | ICD-10-CM | POA: Diagnosis not present

## 2017-09-25 DIAGNOSIS — H409 Unspecified glaucoma: Secondary | ICD-10-CM | POA: Diagnosis not present

## 2017-09-25 DIAGNOSIS — Z79899 Other long term (current) drug therapy: Secondary | ICD-10-CM

## 2017-09-25 DIAGNOSIS — M6389 Disorders of muscle in diseases classified elsewhere, multiple sites: Secondary | ICD-10-CM | POA: Diagnosis not present

## 2017-09-25 DIAGNOSIS — K449 Diaphragmatic hernia without obstruction or gangrene: Secondary | ICD-10-CM | POA: Diagnosis not present

## 2017-09-25 DIAGNOSIS — M25512 Pain in left shoulder: Secondary | ICD-10-CM | POA: Diagnosis present

## 2017-09-25 DIAGNOSIS — Z8546 Personal history of malignant neoplasm of prostate: Secondary | ICD-10-CM

## 2017-09-25 DIAGNOSIS — M75102 Unspecified rotator cuff tear or rupture of left shoulder, not specified as traumatic: Secondary | ICD-10-CM | POA: Diagnosis not present

## 2017-09-25 DIAGNOSIS — M12812 Other specific arthropathies, not elsewhere classified, left shoulder: Secondary | ICD-10-CM | POA: Diagnosis not present

## 2017-09-25 DIAGNOSIS — I739 Peripheral vascular disease, unspecified: Secondary | ICD-10-CM | POA: Diagnosis not present

## 2017-09-25 DIAGNOSIS — I10 Essential (primary) hypertension: Secondary | ICD-10-CM | POA: Diagnosis present

## 2017-09-25 DIAGNOSIS — G8918 Other acute postprocedural pain: Secondary | ICD-10-CM | POA: Diagnosis not present

## 2017-09-25 DIAGNOSIS — R079 Chest pain, unspecified: Secondary | ICD-10-CM | POA: Diagnosis not present

## 2017-09-25 DIAGNOSIS — Z96612 Presence of left artificial shoulder joint: Secondary | ICD-10-CM

## 2017-09-25 DIAGNOSIS — Z7982 Long term (current) use of aspirin: Secondary | ICD-10-CM | POA: Diagnosis not present

## 2017-09-25 DIAGNOSIS — Z471 Aftercare following joint replacement surgery: Secondary | ICD-10-CM | POA: Diagnosis not present

## 2017-09-25 DIAGNOSIS — Z885 Allergy status to narcotic agent status: Secondary | ICD-10-CM | POA: Diagnosis not present

## 2017-09-25 DIAGNOSIS — I2510 Atherosclerotic heart disease of native coronary artery without angina pectoris: Secondary | ICD-10-CM | POA: Diagnosis present

## 2017-09-25 DIAGNOSIS — Z955 Presence of coronary angioplasty implant and graft: Secondary | ICD-10-CM

## 2017-09-25 DIAGNOSIS — M129 Arthropathy, unspecified: Principal | ICD-10-CM | POA: Diagnosis present

## 2017-09-25 DIAGNOSIS — G8911 Acute pain due to trauma: Secondary | ICD-10-CM | POA: Diagnosis not present

## 2017-09-25 DIAGNOSIS — K219 Gastro-esophageal reflux disease without esophagitis: Secondary | ICD-10-CM | POA: Diagnosis present

## 2017-09-25 DIAGNOSIS — R278 Other lack of coordination: Secondary | ICD-10-CM | POA: Diagnosis not present

## 2017-09-25 DIAGNOSIS — M6281 Muscle weakness (generalized): Secondary | ICD-10-CM | POA: Diagnosis not present

## 2017-09-25 DIAGNOSIS — R2681 Unsteadiness on feet: Secondary | ICD-10-CM | POA: Diagnosis not present

## 2017-09-25 DIAGNOSIS — S8002XA Contusion of left knee, initial encounter: Secondary | ICD-10-CM | POA: Diagnosis not present

## 2017-09-25 HISTORY — DX: Atherosclerotic heart disease of native coronary artery without angina pectoris: I25.10

## 2017-09-25 HISTORY — DX: Presence of left artificial shoulder joint: Z96.612

## 2017-09-25 HISTORY — PX: REVERSE SHOULDER ARTHROPLASTY: SHX5054

## 2017-09-25 SURGERY — ARTHROPLASTY, SHOULDER, TOTAL, REVERSE
Anesthesia: General | Site: Shoulder | Laterality: Left

## 2017-09-25 MED ORDER — METOCLOPRAMIDE HCL 5 MG/ML IJ SOLN: 5.0000 mg | Freq: Three times a day (TID) | INTRAMUSCULAR | Status: DC | PRN

## 2017-09-25 MED ORDER — BISACODYL 10 MG RE SUPP: 10.0000 mg | Freq: Every day | RECTAL | Status: DC | PRN

## 2017-09-25 MED ORDER — ROPIVACAINE HCL 7.5 MG/ML IJ SOLN
INTRAMUSCULAR | Status: DC | PRN
  Administered 2017-09-25: 30 mL via PERINEURAL

## 2017-09-25 MED ORDER — MENTHOL 3 MG MT LOZG: 1.0000 | LOZENGE | OROMUCOSAL | Status: DC | PRN

## 2017-09-25 MED ORDER — ACETAMINOPHEN ER 650 MG PO TBCR: 1300.0000 mg | EXTENDED_RELEASE_TABLET | Freq: Two times a day (BID) | ORAL | Status: DC

## 2017-09-25 MED ORDER — CLONIDINE HCL (ANALGESIA) 100 MCG/ML EP SOLN
EPIDURAL | Status: DC | PRN
  Administered 2017-09-25: 50 ug

## 2017-09-25 MED ORDER — LIDOCAINE HCL (CARDIAC) 20 MG/ML IV SOLN
INTRAVENOUS | Status: DC | PRN
  Administered 2017-09-25: 100 mg via INTRAVENOUS

## 2017-09-25 MED ORDER — PHENOL 1.4 % MT LIQD: 1.0000 | OROMUCOSAL | Status: DC | PRN

## 2017-09-25 MED ORDER — ACETAMINOPHEN 500 MG PO TABS
1000.0000 mg | ORAL_TABLET | Freq: Two times a day (BID) | ORAL | Status: DC
Start: 2017-09-25 — End: 2017-09-29
  Administered 2017-09-25 – 2017-09-29 (×8): 1000 mg via ORAL
  Filled 2017-09-25 (×8): qty 2

## 2017-09-25 MED ORDER — ACETAMINOPHEN 500 MG PO TABS
500.0000 mg | ORAL_TABLET | Freq: Four times a day (QID) | ORAL | Status: AC
  Administered 2017-09-26 (×2): 500 mg via ORAL
  Filled 2017-09-25 (×2): qty 1

## 2017-09-25 MED ORDER — MORPHINE SULFATE (PF) 4 MG/ML IV SOLN: 0.5000 mg | INTRAVENOUS | Status: DC | PRN

## 2017-09-25 MED ORDER — PROPOFOL 10 MG/ML IV BOLUS
INTRAVENOUS | Status: AC
  Filled 2017-09-25: qty 20

## 2017-09-25 MED ORDER — LACTATED RINGERS IV SOLN
INTRAVENOUS | Status: DC | PRN
  Administered 2017-09-25: 07:00:00 via INTRAVENOUS

## 2017-09-25 MED ORDER — ACETAMINOPHEN 325 MG PO TABS: 325.0000 mg | ORAL_TABLET | Freq: Four times a day (QID) | ORAL | Status: DC | PRN

## 2017-09-25 MED ORDER — CALCIUM CARBONATE ANTACID 500 MG PO CHEW: 2.0000 | CHEWABLE_TABLET | Freq: Every day | ORAL | Status: DC | PRN

## 2017-09-25 MED ORDER — CEFAZOLIN SODIUM-DEXTROSE 2-4 GM/100ML-% IV SOLN: 2.0000 g | Freq: Four times a day (QID) | INTRAVENOUS | Status: DC

## 2017-09-25 MED ORDER — MORPHINE SULFATE (PF) 2 MG/ML IV SOLN: 0.5000 mg | INTRAVENOUS | Status: DC | PRN

## 2017-09-25 MED ORDER — METHOCARBAMOL 1000 MG/10ML IJ SOLN: 500.0000 mg | Freq: Four times a day (QID) | INTRAVENOUS | Status: DC | PRN

## 2017-09-25 MED ORDER — DEXAMETHASONE SODIUM PHOSPHATE 10 MG/ML IJ SOLN
INTRAMUSCULAR | Status: AC
  Filled 2017-09-25: qty 1

## 2017-09-25 MED ORDER — ALIROCUMAB 75 MG/ML ~~LOC~~ SOSY: 75.0000 mg | PREFILLED_SYRINGE | SUBCUTANEOUS | Status: DC

## 2017-09-25 MED ORDER — EPHEDRINE SULFATE-NACL 50-0.9 MG/10ML-% IV SOSY
PREFILLED_SYRINGE | INTRAVENOUS | Status: DC | PRN
  Administered 2017-09-25: 5 mg via INTRAVENOUS

## 2017-09-25 MED ORDER — ROCURONIUM BROMIDE 10 MG/ML (PF) SYRINGE
PREFILLED_SYRINGE | INTRAVENOUS | Status: AC
  Filled 2017-09-25: qty 5

## 2017-09-25 MED ORDER — ONDANSETRON HCL 4 MG/2ML IJ SOLN: 4.0000 mg | Freq: Four times a day (QID) | INTRAMUSCULAR | Status: DC | PRN

## 2017-09-25 MED ORDER — EPHEDRINE 5 MG/ML INJ
INTRAVENOUS | Status: AC
  Filled 2017-09-25: qty 10

## 2017-09-25 MED ORDER — ASPIRIN EC 81 MG PO TBEC
81.0000 mg | DELAYED_RELEASE_TABLET | Freq: Every day | ORAL | Status: DC
  Administered 2017-09-26 – 2017-09-29 (×4): 81 mg via ORAL
  Filled 2017-09-25 (×4): qty 1

## 2017-09-25 MED ORDER — AZELASTINE HCL 0.1 % NA SOLN
1.0000 | Freq: Two times a day (BID) | NASAL | Status: DC
  Administered 2017-09-26 – 2017-09-29 (×7): 1 via NASAL
  Filled 2017-09-25: qty 30

## 2017-09-25 MED ORDER — TRAMADOL HCL 50 MG PO TABS
50.0000 mg | ORAL_TABLET | Freq: Four times a day (QID) | ORAL | Status: DC
  Administered 2017-09-26 – 2017-09-29 (×16): 50 mg via ORAL
  Filled 2017-09-25 (×16): qty 1

## 2017-09-25 MED ORDER — AMLODIPINE BESYLATE 5 MG PO TABS: 5.0000 mg | ORAL_TABLET | Freq: Every day | ORAL | Status: DC

## 2017-09-25 MED ORDER — SUCRALFATE 1 G PO TABS: 1.0000 g | ORAL_TABLET | Freq: Two times a day (BID) | ORAL | Status: DC

## 2017-09-25 MED ORDER — DOCUSATE SODIUM 100 MG PO CAPS: 100.0000 mg | ORAL_CAPSULE | Freq: Two times a day (BID) | ORAL | Status: DC

## 2017-09-25 MED ORDER — MEPERIDINE HCL 50 MG/ML IJ SOLN: 6.2500 mg | INTRAMUSCULAR | Status: DC | PRN

## 2017-09-25 MED ORDER — SUCRALFATE 1 G PO TABS
1.0000 g | ORAL_TABLET | Freq: Two times a day (BID) | ORAL | Status: DC
  Administered 2017-09-25 – 2017-09-29 (×9): 1 g via ORAL
  Filled 2017-09-25 (×9): qty 1

## 2017-09-25 MED ORDER — POLYETHYLENE GLYCOL 3350 17 G PO PACK: 17.0000 g | PACK | Freq: Every day | ORAL | Status: DC | PRN

## 2017-09-25 MED ORDER — FENTANYL CITRATE (PF) 250 MCG/5ML IJ SOLN
INTRAMUSCULAR | Status: AC
  Filled 2017-09-25: qty 5

## 2017-09-25 MED ORDER — ONDANSETRON HCL 4 MG PO TABS: 4.0000 mg | ORAL_TABLET | Freq: Four times a day (QID) | ORAL | Status: DC | PRN

## 2017-09-25 MED ORDER — PANTOPRAZOLE SODIUM 40 MG PO TBEC: 40.0000 mg | DELAYED_RELEASE_TABLET | ORAL | Status: DC

## 2017-09-25 MED ORDER — PROPOFOL 10 MG/ML IV BOLUS
INTRAVENOUS | Status: DC | PRN
  Administered 2017-09-25: 20 mg via INTRAVENOUS
  Administered 2017-09-25: 150 mg via INTRAVENOUS

## 2017-09-25 MED ORDER — METHOCARBAMOL 500 MG PO TABS: 500.0000 mg | ORAL_TABLET | Freq: Four times a day (QID) | ORAL | Status: DC | PRN

## 2017-09-25 MED ORDER — TRAMADOL HCL 50 MG PO TABS: 50.0000 mg | ORAL_TABLET | Freq: Four times a day (QID) | ORAL | Status: DC

## 2017-09-25 MED ORDER — OXYCODONE-ACETAMINOPHEN 5-325 MG PO TABS: 1.0000 | ORAL_TABLET | ORAL | Status: DC | PRN

## 2017-09-25 MED ORDER — B-12 1000 MCG/ML IJ KIT: 1.0000 mL | PACK | INTRAMUSCULAR | Status: DC

## 2017-09-25 MED ORDER — LIDOCAINE HCL (CARDIAC) 20 MG/ML IV SOLN
INTRAVENOUS | Status: AC
Start: 2017-09-25 — End: 2017-09-25
  Filled 2017-09-25: qty 5

## 2017-09-25 MED ORDER — SODIUM CHLORIDE 0.9 % IV SOLN
INTRAVENOUS | Status: DC
  Administered 2017-09-25: 19:00:00 via INTRAVENOUS

## 2017-09-25 MED ORDER — METOPROLOL TARTRATE 12.5 MG HALF TABLET: 12.5000 mg | ORAL_TABLET | Freq: Two times a day (BID) | ORAL | Status: DC

## 2017-09-25 MED ORDER — ROCURONIUM BROMIDE 100 MG/10ML IV SOLN
INTRAVENOUS | Status: DC | PRN
  Administered 2017-09-25: 40 mg via INTRAVENOUS

## 2017-09-25 MED ORDER — PANTOPRAZOLE SODIUM 40 MG PO TBEC
40.0000 mg | DELAYED_RELEASE_TABLET | Freq: Every day | ORAL | Status: DC
  Administered 2017-09-26 – 2017-09-29 (×4): 40 mg via ORAL
  Filled 2017-09-25 (×4): qty 1

## 2017-09-25 MED ORDER — PROMETHAZINE HCL 25 MG/ML IJ SOLN: 6.2500 mg | INTRAMUSCULAR | Status: DC | PRN

## 2017-09-25 MED ORDER — FENTANYL CITRATE (PF) 100 MCG/2ML IJ SOLN
INTRAMUSCULAR | Status: DC | PRN
  Administered 2017-09-25: 50 ug via INTRAVENOUS

## 2017-09-25 MED ORDER — LORATADINE 10 MG PO TABS: 10.0000 mg | ORAL_TABLET | Freq: Every day | ORAL | Status: DC

## 2017-09-25 MED ORDER — OLOPATADINE HCL 0.1 % OP SOLN: 1.0000 [drp] | Freq: Two times a day (BID) | OPHTHALMIC | Status: DC

## 2017-09-25 MED ORDER — LISINOPRIL 20 MG PO TABS
20.0000 mg | ORAL_TABLET | Freq: Two times a day (BID) | ORAL | Status: DC
  Administered 2017-09-25 – 2017-09-29 (×8): 20 mg via ORAL
  Filled 2017-09-25 (×10): qty 1

## 2017-09-25 MED ORDER — VITAMIN D3 25 MCG (1000 UNIT) PO TABS: 1000.0000 [IU] | ORAL_TABLET | Freq: Every day | ORAL | Status: DC

## 2017-09-25 MED ORDER — NITROGLYCERIN 0.4 MG SL SUBL: 0.4000 mg | SUBLINGUAL_TABLET | SUBLINGUAL | Status: DC | PRN

## 2017-09-25 MED ORDER — CYCLOSPORINE 0.05 % OP EMUL
1.0000 [drp] | Freq: Two times a day (BID) | OPHTHALMIC | Status: DC
  Administered 2017-09-26 – 2017-09-29 (×7): 1 [drp] via OPHTHALMIC
  Filled 2017-09-25 (×9): qty 1

## 2017-09-25 MED ORDER — METOCLOPRAMIDE HCL 5 MG PO TABS: 5.0000 mg | ORAL_TABLET | Freq: Three times a day (TID) | ORAL | Status: DC | PRN

## 2017-09-25 MED ORDER — AZELASTINE HCL 0.1 % NA SOLN: 1.0000 | Freq: Two times a day (BID) | NASAL | Status: DC

## 2017-09-25 MED ORDER — POLYVINYL ALCOHOL 1.4 % OP SOLN
1.0000 [drp] | Freq: Every day | OPHTHALMIC | Status: DC | PRN
  Administered 2017-09-29: 1 [drp] via OPHTHALMIC
  Filled 2017-09-25: qty 15

## 2017-09-25 MED ORDER — CEFAZOLIN SODIUM-DEXTROSE 2-4 GM/100ML-% IV SOLN
2.0000 g | Freq: Four times a day (QID) | INTRAVENOUS | Status: AC
  Administered 2017-09-25 – 2017-09-26 (×3): 2 g via INTRAVENOUS
  Filled 2017-09-25 (×3): qty 100

## 2017-09-25 MED ORDER — LORATADINE 10 MG PO TABS
10.0000 mg | ORAL_TABLET | Freq: Every day | ORAL | Status: DC
  Administered 2017-09-26 – 2017-09-29 (×4): 10 mg via ORAL
  Filled 2017-09-25 (×4): qty 1

## 2017-09-25 MED ORDER — CHLORHEXIDINE GLUCONATE 4 % EX LIQD: 60.0000 mL | Freq: Once | CUTANEOUS | Status: DC

## 2017-09-25 MED ORDER — BUPIVACAINE-EPINEPHRINE (PF) 0.25% -1:200000 IJ SOLN
INTRAMUSCULAR | Status: AC
  Filled 2017-09-25: qty 30

## 2017-09-25 MED ORDER — OLOPATADINE HCL 0.1 % OP SOLN
1.0000 [drp] | Freq: Two times a day (BID) | OPHTHALMIC | Status: DC
  Administered 2017-09-26 – 2017-09-29 (×5): 1 [drp] via OPHTHALMIC
  Filled 2017-09-25: qty 5

## 2017-09-25 MED ORDER — CALCIUM CARBONATE ANTACID 500 MG PO CHEW
2.0000 | CHEWABLE_TABLET | Freq: Every day | ORAL | Status: DC | PRN
  Administered 2017-09-26: 400 mg via ORAL
  Filled 2017-09-25: qty 2

## 2017-09-25 MED ORDER — LATANOPROST 0.005 % OP SOLN: 1.0000 [drp] | Freq: Every day | OPHTHALMIC | Status: DC

## 2017-09-25 MED ORDER — DOCUSATE SODIUM 100 MG PO CAPS
100.0000 mg | ORAL_CAPSULE | Freq: Two times a day (BID) | ORAL | Status: DC
  Administered 2017-09-25 – 2017-09-29 (×8): 100 mg via ORAL
  Filled 2017-09-25 (×8): qty 1

## 2017-09-25 MED ORDER — CYCLOSPORINE 0.05 % OP EMUL: 1.0000 [drp] | Freq: Two times a day (BID) | OPHTHALMIC | Status: DC

## 2017-09-25 MED ORDER — METHOCARBAMOL 1000 MG/10ML IJ SOLN
500.0000 mg | Freq: Four times a day (QID) | INTRAVENOUS | Status: DC | PRN
  Filled 2017-09-25: qty 5

## 2017-09-25 MED ORDER — SODIUM CHLORIDE 0.9 % IV SOLN: INTRAVENOUS | Status: DC

## 2017-09-25 MED ORDER — ACETAMINOPHEN 500 MG PO TABS: 500.0000 mg | ORAL_TABLET | Freq: Four times a day (QID) | ORAL | Status: DC

## 2017-09-25 MED ORDER — AMLODIPINE BESYLATE 5 MG PO TABS
5.0000 mg | ORAL_TABLET | Freq: Every day | ORAL | Status: DC
  Administered 2017-09-27 – 2017-09-28 (×2): 5 mg via ORAL
  Filled 2017-09-25 (×5): qty 1

## 2017-09-25 MED ORDER — PHENYLEPHRINE HCL 10 MG/ML IJ SOLN
INTRAVENOUS | Status: DC | PRN
  Administered 2017-09-25: 25 ug/min via INTRAVENOUS

## 2017-09-25 MED ORDER — DEXAMETHASONE SODIUM PHOSPHATE 10 MG/ML IJ SOLN
INTRAMUSCULAR | Status: DC | PRN
  Administered 2017-09-25: 5 mg via INTRAVENOUS
  Administered 2017-09-25: 5 mg

## 2017-09-25 MED ORDER — ASPIRIN EC 81 MG PO TBEC: 81.0000 mg | DELAYED_RELEASE_TABLET | Freq: Every day | ORAL | Status: DC

## 2017-09-25 MED ORDER — ONDANSETRON HCL 4 MG/2ML IJ SOLN
INTRAMUSCULAR | Status: AC
  Filled 2017-09-25: qty 2

## 2017-09-25 MED ORDER — VITAMIN D 1000 UNITS PO TABS
1000.0000 [IU] | ORAL_TABLET | Freq: Every day | ORAL | Status: DC
  Administered 2017-09-26 – 2017-09-29 (×4): 1000 [IU] via ORAL
  Filled 2017-09-25 (×4): qty 1

## 2017-09-25 MED ORDER — POLYETHYL GLYCOL-PROPYL GLYCOL 0.4-0.3 % OP GEL: Freq: Every day | OPHTHALMIC | Status: DC | PRN

## 2017-09-25 MED ORDER — LATANOPROST 0.005 % OP SOLN
1.0000 [drp] | Freq: Every day | OPHTHALMIC | Status: DC
  Administered 2017-09-26 – 2017-09-28 (×3): 1 [drp] via OPHTHALMIC
  Filled 2017-09-25: qty 2.5

## 2017-09-25 MED ORDER — ONDANSETRON HCL 4 MG/2ML IJ SOLN
INTRAMUSCULAR | Status: DC | PRN
  Administered 2017-09-25: 4 mg via INTRAVENOUS

## 2017-09-25 MED ORDER — LISINOPRIL 20 MG PO TABS: 20.0000 mg | ORAL_TABLET | Freq: Two times a day (BID) | ORAL | Status: DC

## 2017-09-25 MED ORDER — BUPIVACAINE-EPINEPHRINE 0.25% -1:200000 IJ SOLN
INTRAMUSCULAR | Status: DC | PRN
  Administered 2017-09-25: 9 mL
  Administered 2017-09-25: 3 mL

## 2017-09-25 MED ORDER — METOPROLOL TARTRATE 12.5 MG HALF TABLET
12.5000 mg | ORAL_TABLET | Freq: Two times a day (BID) | ORAL | Status: DC
  Administered 2017-09-25 – 2017-09-29 (×9): 12.5 mg via ORAL
  Filled 2017-09-25 (×9): qty 1

## 2017-09-25 MED ORDER — SUGAMMADEX SODIUM 200 MG/2ML IV SOLN
INTRAVENOUS | Status: AC
  Filled 2017-09-25: qty 2

## 2017-09-25 MED ORDER — FENTANYL CITRATE (PF) 100 MCG/2ML IJ SOLN: 25.0000 ug | INTRAMUSCULAR | Status: DC | PRN

## 2017-09-25 SURGICAL SUPPLY — 65 items
BIT DRILL 170X2.5X (BIT) IMPLANT
BIT DRILL 5/64X5 DISP (BIT) ×2 IMPLANT
BIT DRL 170X2.5X (BIT)
BLADE SAG 18X100X1.27 (BLADE) ×2 IMPLANT
CAPT SHLDR REVTOTAL 1 ×2 IMPLANT
COVER SURGICAL LIGHT HANDLE (MISCELLANEOUS) ×2 IMPLANT
DRAPE INCISE IOBAN 66X45 STRL (DRAPES) ×2 IMPLANT
DRAPE ORTHO SPLIT 77X108 STRL (DRAPES) ×2
DRAPE SURG ORHT 6 SPLT 77X108 (DRAPES) ×2 IMPLANT
DRAPE U-SHAPE 47X51 STRL (DRAPES) ×2 IMPLANT
DRILL 2.5 (BIT)
DRSG ADAPTIC 3X8 NADH LF (GAUZE/BANDAGES/DRESSINGS) ×2 IMPLANT
DRSG PAD ABDOMINAL 8X10 ST (GAUZE/BANDAGES/DRESSINGS) ×2 IMPLANT
DURAPREP 26ML APPLICATOR (WOUND CARE) ×2 IMPLANT
ELECT BLADE 4.0 EZ CLEAN MEGAD (MISCELLANEOUS) ×2
ELECT NEEDLE TIP 2.8 STRL (NEEDLE) ×2 IMPLANT
ELECT REM PT RETURN 9FT ADLT (ELECTROSURGICAL) ×2
ELECTRODE BLDE 4.0 EZ CLN MEGD (MISCELLANEOUS) ×1 IMPLANT
ELECTRODE REM PT RTRN 9FT ADLT (ELECTROSURGICAL) ×1 IMPLANT
GAUZE SPONGE 4X4 12PLY STRL (GAUZE/BANDAGES/DRESSINGS) ×2 IMPLANT
GLOVE BIO SURGEON STRL SZ7 (GLOVE) ×2 IMPLANT
GLOVE BIOGEL PI IND STRL 6 (GLOVE) ×1 IMPLANT
GLOVE BIOGEL PI INDICATOR 6 (GLOVE) ×1
GLOVE BIOGEL PI ORTHO PRO 7.5 (GLOVE) ×1
GLOVE BIOGEL PI ORTHO PRO SZ8 (GLOVE) ×1
GLOVE INDICATOR 7.0 STRL GRN (GLOVE) ×2 IMPLANT
GLOVE ORTHO TXT STRL SZ7.5 (GLOVE) ×2 IMPLANT
GLOVE PI ORTHO PRO STRL 7.5 (GLOVE) ×1 IMPLANT
GLOVE PI ORTHO PRO STRL SZ8 (GLOVE) ×1 IMPLANT
GLOVE SURG ORTHO 8.5 STRL (GLOVE) ×4 IMPLANT
GLOVE SURG SS PI 6.0 STRL IVOR (GLOVE) ×2 IMPLANT
GOWN STRL REUS W/ TWL LRG LVL3 (GOWN DISPOSABLE) ×3 IMPLANT
GOWN STRL REUS W/ TWL XL LVL3 (GOWN DISPOSABLE) ×2 IMPLANT
GOWN STRL REUS W/TWL LRG LVL3 (GOWN DISPOSABLE) ×3
GOWN STRL REUS W/TWL XL LVL3 (GOWN DISPOSABLE) ×2
KIT BASIN OR (CUSTOM PROCEDURE TRAY) ×2 IMPLANT
KIT ROOM TURNOVER OR (KITS) ×2 IMPLANT
MANIFOLD NEPTUNE II (INSTRUMENTS) ×2 IMPLANT
NEEDLE 1/2 CIR MAYO (NEEDLE) ×2 IMPLANT
NEEDLE HYPO 25GX1X1/2 BEV (NEEDLE) ×2 IMPLANT
NS IRRIG 1000ML POUR BTL (IV SOLUTION) ×2 IMPLANT
PACK SHOULDER (CUSTOM PROCEDURE TRAY) ×2 IMPLANT
PAD ARMBOARD 7.5X6 YLW CONV (MISCELLANEOUS) ×4 IMPLANT
PIN GUIDE 1.2 (PIN) IMPLANT
PIN GUIDE GLENOPHERE 1.5MX300M (PIN) IMPLANT
PIN METAGLENE 2.5 (PIN) IMPLANT
RESTRAINT HEAD UNIVERSAL NS (MISCELLANEOUS) ×2 IMPLANT
SLING ARM FOAM STRAP LRG (SOFTGOODS) ×2 IMPLANT
SPONGE LAP 18X18 X RAY DECT (DISPOSABLE) IMPLANT
SPONGE LAP 4X18 X RAY DECT (DISPOSABLE) ×2 IMPLANT
STRIP CLOSURE SKIN 1/2X4 (GAUZE/BANDAGES/DRESSINGS) ×4 IMPLANT
SUCTION FRAZIER HANDLE 10FR (MISCELLANEOUS) ×1
SUCTION TUBE FRAZIER 10FR DISP (MISCELLANEOUS) ×1 IMPLANT
SUT FIBERWIRE #2 38 T-5 BLUE (SUTURE) ×4
SUT MNCRL AB 4-0 PS2 18 (SUTURE) ×2 IMPLANT
SUT VIC AB 0 CT2 27 (SUTURE) ×2 IMPLANT
SUT VIC AB 2-0 CT1 27 (SUTURE) ×1
SUT VIC AB 2-0 CT1 TAPERPNT 27 (SUTURE) ×1 IMPLANT
SUT VICRYL 0 CT 1 36IN (SUTURE) ×2 IMPLANT
SUTURE FIBERWR #2 38 T-5 BLUE (SUTURE) ×2 IMPLANT
SYR CONTROL 10ML LL (SYRINGE) ×2 IMPLANT
TOWEL OR 17X24 6PK STRL BLUE (TOWEL DISPOSABLE) ×2 IMPLANT
TOWEL OR 17X26 10 PK STRL BLUE (TOWEL DISPOSABLE) ×2 IMPLANT
TOWER CARTRIDGE SMART MIX (DISPOSABLE) IMPLANT
YANKAUER SUCT BULB TIP NO VENT (SUCTIONS) ×2 IMPLANT

## 2017-09-25 NOTE — Anesthesia Procedure Notes (Signed)
Anesthesia Regional Block: Interscalene brachial plexus block   Pre-Anesthetic Checklist: ,, timeout performed, Correct Patient, Correct Site, Correct Laterality, Correct Procedure, Correct Position, site marked, Risks and benefits discussed,  Surgical consent,  Pre-op evaluation,  At surgeon's request and post-op pain management  Laterality: Left  Prep: chloraprep       Needles:  Injection technique: Single-shot  Needle Type: Stimiplex     Needle Length: 4cm      Additional Needles:   Procedures:,,,, ultrasound used (permanent image in chart),,,,  Narrative:  Start time: 09/25/2017 7:12 AM End time: 09/25/2017 7:15 AM Injection made incrementally with aspirations every 5 mL.  Performed by: Personally  Anesthesiologist: Nolon Nations, MD  Additional Notes: Patient tolerated the procedure well without complications

## 2017-09-25 NOTE — Progress Notes (Signed)
Called pharmacy all medications were d/c when I went to give. Pharmacy called Dr to find out what he wanted to do, so all medications were reinstated for patient.

## 2017-09-25 NOTE — Transfer of Care (Signed)
Immediate Anesthesia Transfer of Care Note  Patient: [name] [name]  Procedure(s) Performed: LEFT REVERSE SHOULDER ARTHROPLASTY; deltoid repair (Left Shoulder)  Patient Location: PACU  Anesthesia Type:GA combined with regional for post-op pain  Level of Consciousness: awake, alert  and oriented  Airway & Oxygen Therapy: Patient Spontanous Breathing and Patient connected to nasal cannula oxygen  Post-op Assessment: Report given to RN, Post -op Vital signs reviewed and stable and Patient moving all extremities  Post vital signs: Reviewed and stable  Last Vitals:  Vitals:   09/25/17 0545  BP: (!) 163/60  Pulse: (!) 52  Resp: 20  Temp: 36.8 C  SpO2: 97%    Last Pain:  Vitals:   09/25/17 0545  TempSrc: Oral         Complications: No apparent anesthesia complications

## 2017-09-25 NOTE — Progress Notes (Signed)
Orthopedic Tech Progress Note Patient Details:  [name] [name] 12-Feb-1936 [id number]  Ortho Devices Type of Ortho Device: Abduction pillow       [name] [name] 09/25/2017, 10:07 AM

## 2017-09-25 NOTE — Brief Op Note (Signed)
09/25/2017  10:17 AM  PATIENT:  [name] [name]  82 y.o. male  PRE-OPERATIVE DIAGNOSIS:  Left shoulder rotator cuff arthropathy, Deltoid deficiency  POST-OPERATIVE DIAGNOSIS:  Left shoulder rotator cuff arthropathy, Deltoid Deficiency  PROCEDURE:  Procedure(s): LEFT REVERSE SHOULDER ARTHROPLASTY; deltoid repair (Left) DePuy Delta Xtend  SURGEON:  Surgeon(s) and Role:    Netta Cedars, MD - Primary  PHYSICIAN ASSISTANT:   ASSISTANTS: [place] Bruns, PA-C   ANESTHESIA:   regional and general  EBL:  150 mL   BLOOD ADMINISTERED:none  DRAINS: none   LOCAL MEDICATIONS USED:  MARCAINE     SPECIMEN:  No Specimen  DISPOSITION OF SPECIMEN:  N/A  COUNTS:  YES  TOURNIQUET:  * No tourniquets in log *  DICTATION: .Other Dictation: Dictation Number [id number]  PLAN OF CARE: Admit to inpatient   PATIENT DISPOSITION:  PACU - hemodynamically stable.   Delay start of Pharmacological VTE agent (>24hrs) due to surgical blood loss or risk of bleeding: not applicable

## 2017-09-25 NOTE — Anesthesia Procedure Notes (Signed)
Procedure Name: Intubation Date/Time: 09/25/2017 7:33 AM Performed by: Kyung Rudd, CRNA Pre-anesthesia Checklist: Patient identified, Emergency Drugs available, Suction available and Patient being monitored Patient Re-evaluated:Patient Re-evaluated prior to induction Oxygen Delivery Method: Circle system utilized Preoxygenation: Pre-oxygenation with 100% oxygen Induction Type: IV induction Ventilation: Mask ventilation without difficulty Laryngoscope Size: Mac and 4 Grade View: Grade I Tube type: Oral Tube size: 7.5 mm Number of attempts: 1 Airway Equipment and Method: Stylet Placement Confirmation: ETT inserted through vocal cords under direct vision,  positive ETCO2 and breath sounds checked- equal and bilateral Secured at: 21 cm Tube secured with: Tape Dental Injury: Teeth and Oropharynx as per pre-operative assessment

## 2017-09-25 NOTE — Interval H&P Note (Signed)
History and Physical Interval Note:  09/25/2017 7:21 AM  [name] [name]  has presented today for surgery, with the diagnosis of Left shoulder rotator cuff arthropathy  The various methods of treatment have been discussed with the patient and family. After consideration of risks, benefits and other options for treatment, the patient has consented to  Procedure(s): LEFT REVERSE SHOULDER ARTHROPLASTY; deltoid repair (Left) as a surgical intervention .  The patient's history has been reviewed, patient examined, no change in status, stable for surgery.  I have reviewed the patient's chart and labs.  Questions were answered to the patient's satisfaction.     Christian Treadway,STEVEN R

## 2017-09-25 NOTE — Op Note (Signed)
[name], [name] NO.:  [id number]  MEDICAL RECORD NO.:  [id number]  LOCATION:                                 FACILITY:  PHYSICIAN:  [name] [name], M.D. DATE OF BIRTH:  02-Jun-1936  DATE OF PROCEDURE:  09/25/2017 DATE OF DISCHARGE:                              OPERATIVE REPORT   PREOPERATIVE DIAGNOSES: 1. Left shoulder rotator cuff tear arthropathy. 2. Left shoulder deltoid deficiency.  POSTOPERATIVE DIAGNOSES: 1. Left shoulder rotator cuff tear arthropathy. 2. Left shoulder deltoid deficiency.  PROCEDURE PERFORMED:  Left shoulder reverse total shoulder arthroplasty using DePuy Delta Xtend prosthesis with primary deltoid repair.  ATTENDING SURGEON:  [name] Heater. Veverly Fells, MD.  ASSISTANT:  [name] [name], [place], who was scrubbed during the entire procedure and necessary for satisfactory completion of surgery.  ANESTHESIA:  General anesthesia was used plus interscalene block.  ESTIMATED BLOOD LOSS:  150 mL.  FLUID REPLACEMENT:  1500 mL of crystalloid instrument.  INSTRUMENT COUNTS:  Correct.  COMPLICATIONS:  There were no complications.  Perioperative antibiotics were given.  INDICATIONS:  The patient is an 82 year old male who is status post prior left shoulder rotator cuff surgery years ago.  The patient unfortunately has gone on to rotator cuff tear arthropathy with tearing of his rotator cuff and advanced degenerative changes in the shoulder. The patient also has anterolateral deltoid deficiency from a prior open incision to do his rotator cuff surgery.  Having failed an extended period of conservative management, the patient presents for operative treatment to restore function and eliminate pain and facilitate him remaining independent being able to do his ADLs.  Risks and benefits of surgery were discussed in detail with the patient and informed consent was obtained.  DESCRIPTION OF PROCEDURE:  After an adequate level of anesthesia  was achieved, the patient was positioned in modified beach-chair position. Left shoulder was correctly identified, sterilely prepped and draped in usual manner.  Time-out was called.  We entered the shoulder using a standard deltopectoral incision, starting at the coracoid process extending down to the anterior humerus.  Dissection down through subcutaneous tissues.  Cephalic vein was identified and taken laterally to the deltoid, pectoralis taken medially.  Conjoined tendon was identified and retracted medially.  We tenodesed the biceps in-situ with 0 Vicryl figure-of-eight suture x2 incorporating the pec tendon.  We then released the subscapularis remnant, which was very thin and not repairable off the lesser tuberosity and tagged for protection of the axillary nerve and retraction.  We then progressively externally rotated the humerus, released the inferior capsule off the humeral neck to where we had good exposure.  We then released the biceps tendon and extended the shoulder and delivered the humeral head out of the wound.  We entered the proximal humerus with a 6-mm reamer and reamed up to size 14.  We placed our 14 intramedullary guide for the head resection and resected for the DePuy Delta Xtend prosthesis in 10 degrees of retroversion.  Once we did the head resection with the oscillating saw, we removed excess osteophytes with a rongeur.  We then subluxed the humerus posteriorly, did a 360-degree capsule labral excision.  We also  removed the rotator cuff remnant, which was detached and retracted, but was bulky in the back of the shoulder.  We did not want that to be an impinging issue, so we removed that and all the suture that we could remove as well.  We freed up the humerus considerably and the mobility of the joint was much improved.  We then removed the labrum and the capsule protecting the axillary nerve.  Once we had good exposure of the glenoid, we found our center point  low and drilled our guide pin.  We then reamed for the baseplate for the DePuy Delta Xtend prosthesis.  We then did our peripheral hand reaming.  We then drilled our central peg hole and impacted the baseplate into place.  We then placed a 48 screw inferiorly, a 30 superiorly and then two 18's anteriorly and posteriorly.  We locked the locking mechanisms on the screws to create a fixed angle and then placed a 42 standard glenosphere onto the baseplate.  We had excellent coverage with the 42 standard and thus screwed that down and also impacted that and screwed it again to make sure it was secure.  I did a finger sweep to make sure there was no soft tissue incarcerated in that and then also checked our axillary nerve, which was in good condition.  We then finished our preparation on the humeral side with reaming for the epi-2 left and then placed that in 10 degrees of retroversion and impacted that into position.  Then, we took a 42/+3 poly trial, inserted that on the humeral side and reduced the shoulder.  There was nice little pop.  There, we had good tension on the conjoined.  No gapping with inferior pull or external rotation.  We removed the trial components, irrigated thoroughly and then selected the HA-coated press-fit stem with a 14 stem and a size 2 left.  We set on the 0 setting and placed in 10 degrees of retroversion.  We impacted that in position.  We then went ahead and selected a 42/+3 real poly, impacted that in place and again reduced the shoulder with good stability.  We had nice smooth motion.  We irrigated thoroughly.  I was able to detect there was a weak spot between the anterior head and the lateral head of the deltoid, which was consistent with his prior repair. We went ahead and opened up just a bit of that incision about 3 cm of it.  Went down through the subcutaneous tissues, found the deltoid, found the little deficit or defect there in the anterior lateral  deltoid and then brought the middle head and the anterior head together and used #2 FiberWire suture to do a side-to-side repair, which completely closed down that little hole there and I think it will enhance the anterolateral deltoid performance.  With that repaired, we irrigated thoroughly, closed the deltopectoral interval with 0 Vicryl suture followed by 2-0 Vicryl for subcutaneous closure and 4-0 Monocryl for skin.  Steri-Strips applied followed by a sterile dressing.  The patient tolerated the surgery well.     [name] [name], M.D.     SRN/MEDQ  D:  09/25/2017  T:  09/25/2017  Job:  [id number]

## 2017-09-25 NOTE — Anesthesia Preprocedure Evaluation (Addendum)
Anesthesia Evaluation  Patient identified by MRN, date of birth, ID band Patient awake    Reviewed: Allergy & Precautions, NPO status , Patient's Chart, lab work & pertinent test results  Airway Mallampati: II  TM Distance: >3 FB Neck ROM: Full    Dental  (+) Dental Advisory Given, Teeth Intact, Partial Upper   Pulmonary neg pulmonary ROS,    breath sounds clear to auscultation       Cardiovascular hypertension, Pt. on medications and Pt. on home beta blockers + CAD, + Cardiac Stents, + CABG and + Peripheral Vascular Disease   Rhythm:Regular Rate:Normal  Cardiac cath 07/29/16 (care everywhere):  1. 2 vessel coronary artery disease status post CABG surgery. LAD is occluded at the junction of proximal and midsegment. The first diagonal is occluded at its origin.  2. LIMA to LAD is patent however LAD beyond the touchdown is small and has diffuse disease.  3. SVG to diagonal is patent there are previous stents showing mild instent restenosis with maximum of 40-50% with TIMI-3 flow.  4. normal left ventricular end-diastolic pressure and no gradient across the aortic valve.   Echo 02/06/16 (from Dr. Irven Shelling notes) showed:  1. LV cavity normal in size. Mild LVH. Mild basal septal hypertrophy. Normal global wall motion. Normal diastolic filling pattern. Calculated EF 69%. 2. LA cavity moderately dilated at 4.7 cm. 3. Mild aortic regurgitation. 4. Mild to moderate mitral regurgitation. 5. Mild to moderate tricuspid regurgitation. 6. Borderline elevation in PA pressure at 30 mmHg suggest mild pulmonary hypertension.  7. IVC is dilated with respiratory variation. This may suggest elevated right heart pressure.   Neuro/Psych    GI/Hepatic Neg liver ROS, hiatal hernia, GERD  ,  Endo/Other  negative endocrine ROS  Renal/GU negative Renal ROS     Musculoskeletal  (+) Arthritis ,   Abdominal   Peds  Hematology  (+) anemia ,    Anesthesia Other Findings   Reproductive/Obstetrics                            Lab Results  Component Value Date   WBC 5.0 09/16/2017   HGB 13.7 09/16/2017   HCT 40.8 09/16/2017   MCV 90.3 09/16/2017   PLT 156 09/16/2017   Lab Results  Component Value Date   CREATININE 0.84 09/16/2017   BUN 13 09/16/2017   NA 137 09/16/2017   K 4.2 09/16/2017   CL 104 09/16/2017   CO2 21 (L) 09/16/2017    Anesthesia Physical  Anesthesia Plan  ASA: III  Anesthesia Plan: General   Post-op Pain Management: GA combined w/ Regional for post-op pain   Induction: Intravenous  PONV Risk Score and Plan: 2 and Ondansetron and Dexamethasone  Airway Management Planned: Oral ETT  Additional Equipment:   Intra-op Plan:   Post-operative Plan: Extubation in OR  Informed Consent: I have reviewed the patients History and Physical, chart, labs and discussed the procedure including the risks, benefits and alternatives for the proposed anesthesia with the patient or authorized representative who has indicated his/her understanding and acceptance.   Dental advisory given  Plan Discussed with:   Anesthesia Plan Comments:         Anesthesia Quick Evaluation

## 2017-09-26 NOTE — Progress Notes (Signed)
.     Subjective: 1 Day Post-Op Procedure(s) (LRB): LEFT REVERSE SHOULDER ARTHROPLASTY; deltoid repair (Left) Patient reports pain as 2 on 0-10 scale.   Denies CP or SOB.  Voiding without difficulty. Positive flatus. Objective: Vital signs in last 24 hours: Temp:  [97.3 F (36.3 C)-98.5 F (36.9 C)] 98.5 F (36.9 C) (03/16 0612) Pulse Rate:  [59-90] 65 (03/16 0612) Resp:  [11-24] 18 (03/16 0612) BP: (107-131)/(47-82) 125/47 (03/16 0612) SpO2:  [92 %-99 %] 95 % (03/16 0612)  Intake/Output from previous day: 03/15 0701 - 03/16 0700 In: 760 [P.O.:60; I.V.:700] Out: 850 [Urine:700; Blood:150] Intake/Output this shift: No intake/output data recorded.  Labs: No results for input(s): HGB in the last 72 hours. No results for input(s): WBC, RBC, HCT, PLT in the last 72 hours. No results for input(s): NA, K, CL, CO2, BUN, CREATININE, GLUCOSE, CALCIUM in the last 72 hours. No results for input(s): LABPT, INR in the last 72 hours.  Physical Exam: Neurologically intact Sensation intact distally Incision: dressing C/D/I Compartment soft Body mass index is 27.23 kg/m.   Assessment/Plan: 1 Day Post-Op Procedure(s) (LRB): LEFT REVERSE SHOULDER ARTHROPLASTY; deltoid repair (Left) Advance diet Up with therapy  Await placement as he lives alone  [name] [name] D for Dr. Melina Schools [facility] [phone] 09/26/2017, 8:39 AM

## 2017-09-27 ENCOUNTER — Encounter (HOSPITAL_COMMUNITY): Payer: Self-pay | Admitting: *Deleted

## 2017-09-27 MED ORDER — ZOLPIDEM TARTRATE 5 MG PO TABS
5.0000 mg | ORAL_TABLET | Freq: Every evening | ORAL | Status: DC | PRN
  Administered 2017-09-28 (×2): 5 mg via ORAL
  Filled 2017-09-27 (×3): qty 1

## 2017-09-27 NOTE — Evaluation (Signed)
Occupational Therapy Evaluation Patient Details Name: [name] [name] MRN: [id number] DOB: 1935-10-22 Today's Date: 09/27/2017    History of Present Illness Pt is an 82 y.o. male s/p L shoulder reverse total shoulder arthroplasty and deltoid repair. PMH significant for arthritis, prostate cancer (1995), hypertension, mitral incompetence, peripheral vascular disease, and vertigo.   Clinical Impression   PTA, pt was independent with ADL and functional mobility. He currently requires min guard assist for toilet transfers, max assist for LB ADL, max assist for UB ADL, and mod assist for standing grooming tasks. Pt is limited in functional participation in ADL secondary to shoulder precautions of dominant L UE. He lives alone and is unable to have 24 hour assistance post-acute D/C. OT awaiting clarification from MD concerning abduction pillow sling wear schedule as well as elbow AROM. Initiated education concerning shoulder precautions with pt and significant other and they would benefit from continued reinforcement. Pt would benefit from continued OT services while admitted to improve independence and safety with ADL and functional mobility. Recommend short-term SNF placement to maximize return to independent PLOF prior to D/C home. OT will continue to follow while admitted.     Follow Up Recommendations  SNF;Follow surgeon's recommendation for DC plan and follow-up therapies    Equipment Recommendations  Other (comment)(TBD at next venue of care)    Recommendations for Other Services       Precautions / Restrictions Precautions Precautions: Shoulder Type of Shoulder Precautions: Conservative: per initial OT order, NWB L shoulder, no AROM/PROM L shoulder, elbow/wrist/hand AROM to tolerance. No information given concerning abduction sling wear in order thus following conservative protocol and maintaining sling at all times until cleared by MD.  Shoulder Interventions: Shoulder abduction pillow;At  all times;Shoulder sling/immobilizer Precaution Booklet Issued: Yes (comment) Precaution Comments: see above; initial OT order cancelled and 3/17 order with no specific details. Maintaining sling and limited to hand, wrist, and forearm AROM in order to maintain shoulder abduction in abduction pillow due to deltoid repair.  Restrictions Weight Bearing Restrictions: Yes LUE Weight Bearing: Non weight bearing      Mobility Bed Mobility Overal bed mobility: Needs Assistance Bed Mobility: Supine to Sit;Sit to Supine     Supine to sit: Min assist Sit to supine: Min guard   General bed mobility comments: Min assist at R UE to power up trunk from [place] Overall transfer level: Needs assistance   Transfers: Sit to/from Stand Sit to Stand: Min guard         General transfer comment: Min guard assist for safety. Slightly unsteady on his feet.     Balance Overall balance assessment: Mild deficits observed, not formally tested                                         ADL either performed or assessed with clinical judgement   ADL Overall ADL's : Needs assistance/impaired Eating/Feeding: Sitting;Minimal assistance   Grooming: Moderate assistance;Standing   Upper Body Bathing: Maximal assistance;Sitting   Lower Body Bathing: Maximal assistance;Sit to/from stand   Upper Body Dressing : Maximal assistance;Sitting   Lower Body Dressing: Maximal assistance;Sit to/from stand   Toilet Transfer: Min guard;Ambulation   Toileting- Clothing Manipulation and Hygiene: Sit to/from stand;Minimal assistance       Functional mobility during ADLs: Min guard General ADL Comments: Pt significantly limited without use of L UE as this is his dominant  UE.     Vision Patient Visual Report: No change from baseline Vision Assessment?: No apparent visual deficits     Perception     Praxis      Pertinent Vitals/Pain Pain Assessment: 0-10 Pain Score: 4  Pain  Location: L shoulder Pain Descriptors / Indicators: Aching;Discomfort;Operative site guarding Pain Intervention(s): Limited activity within patient's tolerance;Monitored during session;Repositioned     Hand Dominance Left   Extremity/Trunk Assessment Upper Extremity Assessment Upper Extremity Assessment: LUE deficits/detail LUE Deficits / Details: Numbness in fingers at times due to sling position. Digit, wrist, and forearm AROM WFL. No motion at shoulder per protocol.    Lower Extremity Assessment Lower Extremity Assessment: Defer to PT evaluation       Communication Communication Communication: No difficulties   Cognition Arousal/Alertness: Awake/alert Behavior During Therapy: WFL for tasks assessed/performed Overall Cognitive Status: Within Functional Limits for tasks assessed                                     General Comments  Significant other present during session.     Exercises Exercises: Shoulder;Other exercises Shoulder Exercises Wrist Flexion: AROM;Left;10 reps;Seated(in abduction pillow) Wrist Extension: AROM;10 reps;Seated;Left(in abduction pillow) Digit Composite Flexion: AROM;Left;10 reps;Seated(in abduction pillow) Composite Extension: AROM;Left;10 reps;Seated(in abduction pillow) Other Exercises Other Exercises: L forearm supination/pronation in abduction pillow.  Other Exercises: Educated concerning cervical AROM    Shoulder Instructions Shoulder Instructions Donning/doffing shirt without moving shoulder: Maximal assistance Method for sponge bathing under operated UE: Maximal assistance Donning/doffing sling/immobilizer: (deferred as awaiting MD clearance) Correct positioning of sling/immobilizer: Maximal assistance Pendulum exercises (written home exercise program): (not indicated) ROM for elbow, wrist and digits of operated UE: Supervision/safety(digit, wrist, forearm this session only) Sling wearing schedule (on at all times/off for  ADL's): Supervision/safety Proper positioning of operated UE when showering: Maximal assistance Positioning of UE while sleeping: Minimal assistance    Home Living Family/patient expects to be discharged to:: Skilled nursing facility Living Arrangements: Alone                               Additional Comments: Pt will have assistance from significant other following D/C from SNF.       Prior Functioning/Environment Level of Independence: Independent                 OT Problem List: Decreased strength;Decreased range of motion;Decreased activity tolerance;Impaired balance (sitting and/or standing);Decreased safety awareness;Decreased knowledge of use of DME or AE;Decreased knowledge of precautions;Pain      OT Treatment/Interventions: Self-care/ADL training;Therapeutic exercise;Energy conservation;DME and/or AE instruction;Therapeutic activities;Patient/family education;Balance training    OT Goals(Current goals can be found in the care plan section) Acute Rehab OT Goals Patient Stated Goal: to go to rehab OT Goal Formulation: With patient/family Time For Goal Achievement: 10/11/17 Potential to Achieve Goals: Good ADL Goals Pt Will Perform Grooming: with set-up;standing Pt Will Perform Upper Body Dressing: with min assist;sitting Pt Will Perform Lower Body Dressing: with min assist;sit to/from stand Pt Will Transfer to Toilet: with modified independence;ambulating Pt Will Perform Toileting - Clothing Manipulation and hygiene: with supervision;sit to/from stand Pt/caregiver will Perform Home Exercise Program: Left upper extremity;With written HEP provided(wrist, forearm, and hand exercises)  OT Frequency: Min 3X/week   Barriers to D/C:            Co-evaluation  AM-PAC PT "6 Clicks" Daily Activity     Outcome Measure Help from another person eating meals?: A Little Help from another person taking care of personal grooming?: A Lot Help  from another person toileting, which includes using toliet, bedpan, or urinal?: A Little Help from another person bathing (including washing, rinsing, drying)?: A Lot Help from another person to put on and taking off regular upper body clothing?: A Lot Help from another person to put on and taking off regular lower body clothing?: A Lot 6 Click Score: 14   End of Session Equipment Utilized During Treatment: Other (comment)(L shoulder abduction splint) Nurse Communication: Mobility status  Activity Tolerance: Patient tolerated treatment well Patient left: in bed;with call bell/phone within reach;with family/visitor present  OT Visit Diagnosis: Other abnormalities of gait and mobility (R26.89);Pain Pain - Right/Left: Left Pain - part of body: Shoulder                Time: 1750-1815 OT Time Calculation (min): 25 min Charges:  OT General Charges $OT Visit: 1 Visit OT Evaluation $OT Eval Moderate Complexity: 1 Mod OT Treatments $Self Care/Home Management : 8-22 mins G-Codes:     Norman Herrlich, MS OTR/L  Pager: Roff A [place] 09/27/2017, 6:58 PM

## 2017-09-27 NOTE — Progress Notes (Signed)
CSW acknowledging consult for SNF placement. However, at this time, there is no PT order or evaluation. Patient has a managed Medicare insurance policy; there will need to be a PT evaluation that is recommending rehabilitation for the patient to obtain insurance approval to admit to SNF at discharge.  Please place consult for PT evaluation in order for disposition plan to be determined. CSW will continue to follow and facilitate SNF placement if needed.  Laveda Abbe, [place] Clinical Social Worker [phone]

## 2017-09-27 NOTE — Progress Notes (Signed)
Orthopedics Progress Note  Subjective: Patient feeling much better.  Objective:  Vitals:   09/26/17 2139 09/27/17 0536  BP: (!) 149/68 126/71  Pulse: 78 64  Resp: 18 16  Temp: 98.8 F (37.1 C) 98.3 F (36.8 C)  SpO2: 95% 94%    General: Awake and alert  Musculoskeletal: Left shoulder dressing changed to Aquacel, mod swelling and bruising, NVI distally   Lab Results  Component Value Date   WBC 5.0 09/16/2017   HGB 13.7 09/16/2017   HCT 40.8 09/16/2017   MCV 90.3 09/16/2017   PLT 156 09/16/2017       Component Value Date/Time   NA 137 09/16/2017 1332   K 4.2 09/16/2017 1332   CL 104 09/16/2017 1332   CO2 21 (L) 09/16/2017 1332   GLUCOSE 121 (H) 09/16/2017 1332   BUN 13 09/16/2017 1332   CREATININE 0.84 09/16/2017 1332   CALCIUM 9.0 09/16/2017 1332   GFRNONAA >60 09/16/2017 1332   GFRAA >60 09/16/2017 1332    No results found for: INR, PROTIME  Assessment/Plan: POD #2 s/p Procedure(s): LEFT REVERSE SHOULDER ARTHROPLASTY; deltoid repair Discharge to SNF when bed available  Remo Lipps R. Veverly Fells, MD 09/27/2017 8:17 AM

## 2017-09-27 NOTE — Clinical Social Work Note (Signed)
Clinical Social Work Assessment  Patient Details  Name: [name] [name] MRN: [id number] Date of Birth: [birth year]  Date of referral:  09/27/17               Reason for consult:  Facility Placement                Permission sought to share information with:  Chartered certified accountant granted to share information::  Yes, Verbal Permission Granted  Name::     [name] [name]  Agency::  SNF  Relationship::  Friend, Sister  Contact Information:     Housing/Transportation Living arrangements for the past 2 months:  Apartment Source of Information:  Patient Patient Interpreter Needed:  None Criminal Activity/Legal Involvement Pertinent to Current Situation/Hospitalization:  No - Comment as needed Significant Relationships:  Friend, Siblings Lives with:  Self Do you feel safe going back to the place where you live?  Yes Need for family participation in patient care:  No (Coment)  Care giving concerns:  Patient lives home alone with no available support from family for assistance (has a sister local, but she's disabled; has a friend that lives in [place] but she has to work), so he will need short term rehab at discharge to improve mobility and recover before returning home alone.   Social Worker assessment / plan:  CSW met with patient to discuss discharge plan. CSW discussed how the MD had requested that CSW discuss rehab, but that there were other things that needed to happen in order for the process to be put in place. CSW explained SNF placement process and need for PT and insurance authorization. CSW discussed facility options with the patient. CSW to follow up with bed offers.  Employment status:  Retired Nurse, adult PT Recommendations:  Not assessed at this time Information / Referral to community resources:  [facility]  Patient/Family's Response to care:  Patient agreeable to SNF placement.  Patient/Family's  Understanding of and Emotional Response to Diagnosis, Current Treatment, and Prognosis:  Patient acknowledged that he knows he's going to need to go to SNF because he's had his whole shoulder replaced of his dominant hand and he can't do anything for himself right now, plus he needs the therapy to recover ability to use his arm as he heals. Patient discussed how he doesn't have any family available to help him, and would like to be able to find a SNF near where he lives.  Emotional Assessment Appearance:  Appears stated age Attitude/Demeanor/Rapport:  Engaged Affect (typically observed):  Pleasant Orientation:  Oriented to Self, Oriented to Place, Oriented to  Time, Oriented to Situation Alcohol / Substance use:  Not Applicable Psych involvement (Current and /or in the community):  No (Comment)  Discharge Needs  Concerns to be addressed:  Care Coordination Readmission within the last 30 days:  No Current discharge risk:  Lives alone, Dependent with Mobility Barriers to Discharge:  Continued Medical Work up, [place], [place] 09/27/2017, 3:43 PM

## 2017-09-28 MED ORDER — OXYCODONE-ACETAMINOPHEN 5-325 MG PO TABS: 1.0000 | ORAL_TABLET | ORAL | 0 refills | Status: DC | PRN

## 2017-09-28 MED ORDER — METHOCARBAMOL 500 MG PO TABS: 500.0000 mg | ORAL_TABLET | Freq: Four times a day (QID) | ORAL | 0 refills | Status: DC | PRN

## 2017-09-28 MED ORDER — IPRATROPIUM-ALBUTEROL 0.5-2.5 (3) MG/3ML IN SOLN
RESPIRATORY_TRACT | Status: AC
  Filled 2017-09-28: qty 3

## 2017-09-28 NOTE — Social Work (Addendum)
CSW met with patient and partner and provided them the list of SNF's that has offered a bed. They indicated that they will review list of SNF offers and select a SNF. CSW explained that Insurance Josem Kaufmann will need to be initiated by SNF today and that patient may not dc today as SNF will not have Insurance Auth today.  CSW will f/u.  1:30pm: CSW met with patient/partner at bedside and they have accepted SNF offer from [facility].  CSW contacted SNF and they will start Insurance Auth today.  CSW will f/u for disposition.  Elissa Hefty, LCSW Clinical Social Worker [phone]

## 2017-09-28 NOTE — Anesthesia Postprocedure Evaluation (Signed)
Anesthesia Post Note  Patient: [name] [name]  Procedure(s) Performed: LEFT REVERSE SHOULDER ARTHROPLASTY; deltoid repair (Left Shoulder)     Patient location during evaluation: PACU Anesthesia Type: General Level of consciousness: sedated and patient cooperative Pain management: pain level controlled Vital Signs Assessment: post-procedure vital signs reviewed and stable Respiratory status: spontaneous breathing Cardiovascular status: stable Anesthetic complications: no    Last Vitals:  Vitals:   09/27/17 1651 09/27/17 1900  BP: 126/60 (!) 140/53  Pulse:  67  Resp:  16  Temp:  36.9 C  SpO2:  94%    Last Pain:  Vitals:   09/28/17 0620  TempSrc:   PainSc: [place]

## 2017-09-28 NOTE — Social Work (Signed)
Once PT evaluation completed, CSW can continue with SNF process.  CSW will continue to follow.  Elissa Hefty, LCSW Clinical Social Worker [phone]

## 2017-09-28 NOTE — [place] FL2 (Signed)
[place] MEDICAID FL2 LEVEL OF CARE SCREENING TOOL     IDENTIFICATION  Patient Name: [name] [name] Birthdate: 1936-07-11 Sex: male Admission Date (Current Location): 09/25/2017  [facility] and [place] Number:  Herbalist and Address:  The . [facility], [place] [address] [place], [place] [address]      Provider Number: [id number]  Attending Physician Name and Address:  Netta Cedars, MD  Relative Name and Phone Number:  Morrie Sheldon, sister, [phone]    Current Level of Care: Hospital Recommended Level of Care: [place] Prior Approval Number:    Date Approved/Denied:   PASRR Number: [id number]  Discharge Plan: SNF    Current Diagnoses: Patient Active Problem List   Diagnosis Date Noted  . S/P shoulder replacement, left 09/25/2017    Orientation RESPIRATION BLADDER Height & Weight     Self, Time, Situation, Place  Normal Continent Weight: 198 lb (89.8 kg) Height:  5' 11.5" (181.6 cm)  BEHAVIORAL SYMPTOMS/MOOD NEUROLOGICAL BOWEL NUTRITION STATUS      Continent Diet(See DC Summary)  AMBULATORY STATUS COMMUNICATION OF NEEDS Skin   Limited Assist Verbally Surgical wounds                       Personal Care Assistance Level of Assistance  Dressing, Feeding, Bathing Bathing Assistance: Maximum assistance Feeding assistance: Limited assistance Dressing Assistance: Maximum assistance     Functional Limitations Info  Sight, Speech, Hearing Sight Info: Adequate Hearing Info: Adequate Speech Info: Adequate    SPECIAL CARE FACTORS FREQUENCY  PT (By licensed PT), OT (By licensed OT)     PT Frequency: 5x week OT Frequency: 5x week            Contractures      Additional Factors Info  Code Status, Allergies Code Status Info: Full Allergies Info: ATORVASTATIN, ROSUVASTATIN, STATINS, HYDROCODONE            Current Medications (09/28/2017):  This is the current hospital active medication list Current  Facility-Administered Medications  Medication Dose Route Frequency Provider Last Rate Last Dose  . 0.9 %  sodium chloride infusion   Intravenous Continuous Constable, Amber, PA-C   Stopped at 09/26/17 1114  . acetaminophen (TYLENOL) tablet 1,000 mg  1,000 mg Oral BID Cecilio Asper, Amber, PA-C   1,000 mg at 09/28/17 0857  . acetaminophen (TYLENOL) tablet 325-650 mg  325-650 mg Oral Q6H PRN Cecilio Asper, Amber, PA-C      . amLODipine (NORVASC) tablet 5 mg  5 mg Oral Daily Constable, Amber, PA-C   5 mg at 09/27/17 2210  . aspirin EC tablet 81 mg  81 mg Oral Daily Constable, Amber, PA-C   81 mg at 09/28/17 0901  . azelastine (ASTELIN) 0.1 % nasal spray 1 spray  1 spray Each Nare BID Cecilio Asper, Amber, PA-C   1 spray at 09/28/17 0859  . bisacodyl (DULCOLAX) suppository 10 mg  10 mg Rectal Daily PRN Cecilio Asper, Amber, PA-C      . calcium carbonate (TUMS - dosed in mg elemental calcium) chewable tablet 400 mg of elemental calcium  2 tablet Oral Daily PRN Cecilio Asper, Amber, PA-C   400 mg of elemental calcium at 09/26/17 0107  . cholecalciferol (VITAMIN D) tablet 1,000 Units  1,000 Units Oral Daily Ardeen Jourdain, PA-C   1,000 Units at 09/28/17 0858  . cycloSPORINE (RESTASIS) 0.05 % ophthalmic emulsion 1 drop  1 drop Both Eyes BID Cecilio Asper, Amber, PA-C   1 drop at 09/28/17 0858  .  docusate sodium (COLACE) capsule 100 mg  100 mg Oral BID Cecilio Asper, Amber, PA-C   100 mg at 09/28/17 0859  . latanoprost (XALATAN) 0.005 % ophthalmic solution 1 drop  1 drop Both Eyes QHS Constable, Amber, PA-C   1 drop at 09/27/17 2215  . lisinopril (PRINIVIL,ZESTRIL) tablet 20 mg  20 mg Oral BID Cecilio Asper, Amber, PA-C   20 mg at 09/28/17 0900  . loratadine (CLARITIN) tablet 10 mg  10 mg Oral Daily Constable, Amber, PA-C   10 mg at 09/28/17 0858  . menthol-cetylpyridinium (CEPACOL) lozenge 3 mg  1 lozenge Oral PRN Cecilio Asper, Amber, PA-C      . methocarbamol (ROBAXIN) 500 mg in dextrose 5 % 50 mL IVPB  500 mg Intravenous Q6H PRN  Constable, Amber, PA-C      . methocarbamol (ROBAXIN) tablet 500 mg  500 mg Oral Q6H PRN Constable, Amber, PA-C      . metoCLOPramide (REGLAN) injection 5-10 mg  5-10 mg Intravenous Q8H PRN Constable, Amber, PA-C      . metoCLOPramide (REGLAN) tablet 5-10 mg  5-10 mg Oral Q8H PRN Constable, Amber, PA-C      . metoprolol tartrate (LOPRESSOR) tablet 12.5 mg  12.5 mg Oral BID Constable, Amber, PA-C   12.5 mg at 09/28/17 0858  . morphine 2 MG/ML injection 0.5-1 mg  0.5-1 mg Intravenous Q2H PRN Cecilio Asper, Amber, PA-C      . nitroGLYCERIN (NITROSTAT) SL tablet 0.4 mg  0.4 mg Sublingual Q5 min PRN Constable, Amber, PA-C      . olopatadine (PATANOL) 0.1 % ophthalmic solution 1 drop  1 drop Both Eyes BID Constable, Amber, PA-C   1 drop at 09/28/17 0858  . ondansetron (ZOFRAN) injection 4 mg  4 mg Intravenous Q6H PRN Constable, Amber, PA-C      . oxyCODONE-acetaminophen (PERCOCET/ROXICET) 5-325 MG per tablet 1 tablet  1 tablet Oral Q4H PRN Cecilio Asper, Amber, PA-C      . pantoprazole (PROTONIX) EC tablet 40 mg  40 mg Oral Daily Constable, Amber, PA-C   40 mg at 09/28/17 4128  . polyethylene glycol (MIRALAX / GLYCOLAX) packet 17 g  17 g Oral Daily PRN Cecilio Asper, Amber, PA-C      . polyvinyl alcohol (LIQUIFILM TEARS) 1.4 % ophthalmic solution 1 drop  1 drop Both Eyes 5 X Daily PRN Constable, Amber, PA-C      . sucralfate (CARAFATE) tablet 1 g  1 g Oral BID Cecilio Asper, Amber, PA-C   1 g at 09/28/17 7867  . traMADol (ULTRAM) tablet 50 mg  50 mg Oral Q6H Constable, Amber, PA-C   50 mg at 09/28/17 6720  . zolpidem (AMBIEN) tablet 5 mg  5 mg Oral QHS PRN Mayo, Darla Lesches, PA-C   5 mg at 09/28/17 0017     Discharge Medications: Please see discharge summary for a list of discharge medications.  Relevant Imaging Results:  Relevant Lab Results:   Additional Information SS#: [place], LCSW

## 2017-09-28 NOTE — Progress Notes (Addendum)
   Subjective: 3 Days Post-Op Procedure(s) (LRB): LEFT REVERSE SHOULDER ARTHROPLASTY; deltoid repair (Left)  Pt doing well this morning Plan for SNF placement once bed available Denies any new symptoms or issues Patient reports pain as mild.  Objective:   VITALS:   Vitals:   09/27/17 1651 09/27/17 1900  BP: 126/60 (!) 140/53  Pulse:  67  Resp:  16  Temp:  98.4 F (36.9 C)  SpO2:  94%    Left shoulder incision healing well nv intact distally No rashes or edema distally Mild ecchymosis distally  LABS No results for input(s): HGB, HCT, WBC, PLT in the last 72 hours.  No results for input(s): NA, K, BUN, CREATININE, GLUCOSE in the last 72 hours.   Assessment/Plan: 3 Days Post-Op Procedure(s) (LRB): LEFT REVERSE SHOULDER ARTHROPLASTY; deltoid repair (Left) Plan to d/c to SNF once bed available, today if able Gentle PT/OT Pain management F/u in 2 weeks once discharged    [place] PA-C, [facility] is now [facility] [address], [place], [place], [place] [address] Phone: [phone] [email or web address]  [employer]    Doing very well from [facility], awaiting D/C due to insurance negotiations. Will continue rehab here in the meantime.  Augustin Schooling, MD

## 2017-09-28 NOTE — Progress Notes (Signed)
Occupational Therapy Treatment Patient Details Name: [name] [name] MRN: [id number] DOB: [birth year] Today's Date: 09/28/2017    History of present illness Pt is an 82 y.o. male s/p L shoulder reverse total shoulder arthroplasty and deltoid repair. PMH significant for arthritis, prostate cancer (1995), hypertension, mitral incompetence, peripheral vascular disease, and vertigo.   OT comments  Pt demonstrating progress toward OT goals this session. He does demonstrate improved stability with cane but continued to require min guard assist during toileting transfers. Continued education with elbow, wrist, hand exercises within confines of abduction pillow and sling. Educated pt concerning method to bathe L axillary region without moving shoulder and he verbalized understanding. Additionally discussed best technique to assist with nurse tech. OT will cotninue to follow while admitted.  OT has contacted surgeon earlier this afternoon and await further clarification concerning shoulder precautions related to ADL participation specifically dressing and bathing.    Follow Up Recommendations  SNF;Follow surgeon's recommendation for DC plan and follow-up therapies    Equipment Recommendations  Other (comment)(TBD at next venue of care)    Recommendations for Other Services      Precautions / Restrictions Precautions Precautions: Shoulder Type of Shoulder Precautions: Conservative: per initial OT order, NWB L shoulder, no AROM/PROM L shoulder, elbow/wrist/hand AROM to tolerance. No information given concerning abduction sling wear in order thus following conservative protocol and maintaining sling at all times until cleared by MD.  Shoulder Interventions: Shoulder abduction pillow;At all times;Shoulder sling/immobilizer Precaution Booklet Issued: Yes (comment) Precaution Comments: see above; initial OT order cancelled and 3/17 order with no specific details. Maintaining sling during elbow, forearm,  wrist, and hand AROM and limited to confines of sling this session Restrictions Weight Bearing Restrictions: Yes LUE Weight Bearing: Non weight bearing       Mobility Bed Mobility Overal bed mobility: Needs Assistance Bed Mobility: Supine to Sit     Supine to sit: HOB elevated;Min guard     General bed mobility comments: Min guard assist for safety.   Transfers Overall transfer level: Needs assistance Equipment used: Straight cane Transfers: Sit to/from Stand Sit to Stand: Min guard         General transfer comment: Min guard assist for safety.     Balance Overall balance assessment: Needs assistance Sitting-balance support: Feet supported;No upper extremity supported Sitting balance-Leahy Scale: Good     Standing balance support: No upper extremity supported Standing balance-Leahy Scale: Fair Standing balance comment: Benefits from cane.                            ADL either performed or assessed with clinical judgement   ADL Overall ADL's : Needs assistance/impaired Eating/Feeding: Sitting;Set up                   Lower Body Dressing: Maximal assistance;Sit to/from stand   Toilet Transfer: Ambulation;Min guard(with cane)           Functional mobility during ADLs: Cane;Min guard General ADL Comments: More stable with cane. Required seated rest break. Able to self-feed with set-up but continues to require assistance for LB tasks.      Vision   Vision Assessment?: No apparent visual deficits   Perception     Praxis      Cognition Arousal/Alertness: Awake/alert Behavior During Therapy: WFL for tasks assessed/performed Overall Cognitive Status: Within Functional Limits for tasks assessed  Exercises Exercises: Shoulder;Other exercises Shoulder Exercises Wrist Flexion: AROM;Left;10 reps;Seated Wrist Extension: AROM;10 reps;Seated;Left Digit Composite Flexion: AROM;Left;10  reps;Seated Composite Extension: AROM;Left;10 reps;Seated Other Exercises Other Exercises: All exercises above completed while in abduction pillow.  Other Exercises: Educated concerning cervical AROM    Shoulder Instructions Shoulder Instructions Donning/doffing shirt without moving shoulder: Maximal assistance Method for sponge bathing under operated UE: Maximal assistance Donning/doffing sling/immobilizer: (deferred as awaiting MD clearance) Correct positioning of sling/immobilizer: Maximal assistance Pendulum exercises (written home exercise program): (not indicated) ROM for elbow, wrist and digits of operated UE: Supervision/safety Sling wearing schedule (on at all times/off for ADL's): Supervision/safety Proper positioning of operated UE when showering: Maximal assistance Positioning of UE while sleeping: Minimal assistance     General Comments      Pertinent Vitals/ Pain       Pain Assessment: Faces Faces Pain Scale: Hurts little more Pain Location: L shoulder Pain Descriptors / Indicators: Discomfort;Operative site guarding Pain Intervention(s): Monitored during session  Home Living                                          Prior Functioning/Environment              Frequency  Min 3X/week        Progress Toward Goals  OT Goals(current goals can now be found in the care plan section)  Progress towards OT goals: Progressing toward goals  Acute Rehab OT Goals Patient Stated Goal: to go to rehab OT Goal Formulation: With patient/family Time For Goal Achievement: 10/11/17 Potential to Achieve Goals: Good  Plan Discharge plan needs to be updated    Co-evaluation                 AM-PAC PT "6 Clicks" Daily Activity     Outcome Measure   Help from another person eating meals?: A Little Help from another person taking care of personal grooming?: A Lot Help from another person toileting, which includes using toliet, bedpan, or  urinal?: A Little Help from another person bathing (including washing, rinsing, drying)?: A Lot Help from another person to put on and taking off regular upper body clothing?: A Lot Help from another person to put on and taking off regular lower body clothing?: A Lot 6 Click Score: 14    End of Session Equipment Utilized During Treatment: Other (comment)(L shoudler abduction pillow and splint)  OT Visit Diagnosis: Other abnormalities of gait and mobility (R26.89);Pain Pain - Right/Left: Left Pain - part of body: Shoulder   Activity Tolerance Patient tolerated treatment well   Patient Left in bed;with call bell/phone within reach;with family/visitor present   Nurse Communication Mobility status        Time: 1620-1640 OT Time Calculation (min): 20 min  Charges: OT General Charges $OT Visit: 1 Visit OT Treatments $Therapeutic Activity: 8-22 mins  Norman Herrlich, MS OTR/L  Pager: [place] A Brailynn Breth 09/28/2017, 5:37 PM

## 2017-09-28 NOTE — Discharge Summary (Addendum)
Orthopedic Discharge Summary        Physician Discharge Summary  Patient ID: [name] [name] MRN: [id number] DOB/AGE: 1935-10-05 82 y.o.  Admit date: 09/25/2017 Discharge date: 09/29/17   Procedures:  Procedure(s) (LRB): LEFT REVERSE SHOULDER ARTHROPLASTY; deltoid repair (Left)  Attending Physician:  Dr. Esmond Plants  Admission Diagnoses:   Left shoulder cuff arthropathy  Discharge Diagnoses:  Left shoulder cuff arthropathy   Past Medical History:  Diagnosis Date  . Arthritis   . Cancer [facility]) 1995   prostate ca   . Coronary artery disease   . Esophageal stricture 08/2016  . GERD (gastroesophageal reflux disease)   . Glaucoma   . History of hiatal hernia   . Hypertension   . MI (mitral incompetence)   . Peripheral neuropathy   . Peripheral vascular disease ([place])   . Vertigo     PCP: Jani Gravel, MD   Discharged Condition: good  Hospital Course:  Patient underwent the above stated procedure on 09/25/2017. Patient tolerated the procedure well and brought to the recovery room in good condition and subsequently to the floor. Patient had an uncomplicated hospital course and was stable for discharge.   Disposition: Discharge disposition: 03-Skilled Nursing Facility      with follow up in 2 weeks     Discharge Instructions    Call MD / Call 911   Complete by:  As directed    If you experience chest pain or shortness of breath, CALL 911 and be transported to the hospital emergency room.  If you develope a fever above 101 F, pus (white drainage) or increased drainage or redness at the wound, or calf pain, call your surgeon's office.   Constipation Prevention   Complete by:  As directed    Drink plenty of fluids.  Prune juice may be helpful.  You may use a stool softener, such as Colace (over the counter) 100 mg twice a day.  Use MiraLax (over the counter) for constipation as needed.   Diet - low sodium heart healthy   Complete by:  As directed    Increase activity  slowly as tolerated   Complete by:  As directed       Allergies as of 09/28/2017      Reactions   Atorvastatin Other (See Comments)   Joint pain   Rosuvastatin Other (See Comments)   Joint pain   Statins Other (See Comments)   Leg pain    Hydrocodone Rash      Medication List    TAKE these medications   acetaminophen 650 MG CR tablet Commonly known as:  TYLENOL Take 1,300 mg by mouth 2 (two) times daily.   amLODipine 5 MG tablet Commonly known as:  NORVASC Take 5 mg by mouth daily.   aspirin EC 81 MG tablet Take 81 mg by mouth daily.   azelastine 0.1 % nasal spray Commonly known as:  ASTELIN Place 1 spray into both nostrils 2 (two) times daily. Use in each nostril as directed   B-12 1000 MCG/ML Kit Inject 1 mL as directed every 30 (thirty) days.   BABY SHAMPOO EX Rub into a wash cloth and rub into the eyes 3 to 4 times daily for dry eyes / eye build up   calcium carbonate 500 MG chewable tablet Commonly known as:  TUMS - dosed in mg elemental calcium Chew 2 tablets by mouth daily as needed for indigestion or heartburn.   cetirizine 10 MG tablet Commonly known as:  ZYRTEC Take  10 mg by mouth daily.   cholecalciferol 1000 units tablet Commonly known as:  VITAMIN D Take 1,000 Units by mouth daily.   cycloSPORINE 0.05 % ophthalmic emulsion Commonly known as:  RESTASIS Place 1 drop into both eyes 2 (two) times daily.   latanoprost 0.005 % ophthalmic solution Commonly known as:  XALATAN Place 1 drop into both eyes at bedtime.   lisinopril 20 MG tablet Commonly known as:  PRINIVIL,ZESTRIL Take 20 mg by mouth 2 (two) times daily.   methocarbamol 500 MG tablet Commonly known as:  ROBAXIN Take 1 tablet (500 mg total) by mouth every 6 (six) hours as needed for muscle spasms.   metoprolol tartrate 25 MG tablet Commonly known as:  LOPRESSOR Take 12.5 mg by mouth 2 (two) times daily.   nitroGLYCERIN 0.4 MG SL tablet Commonly known as:  NITROSTAT Place 0.4 mg  under the tongue every 5 (five) minutes as needed for chest pain.   Olopatadine HCl 0.2 % Soln Place 1 drop into both eyes daily.   oxyCODONE-acetaminophen 5-325 MG tablet Commonly known as:  PERCOCET/ROXICET Take 1 tablet by mouth every 4 (four) hours as needed for severe pain.   pantoprazole 40 MG tablet Commonly known as:  PROTONIX Take 40 mg by mouth every morning.   PRALUENT 75 MG/ML [place] Generic drug:  Alirocumab Inject 75 mg into the skin every 14 (fourteen) days.   sucralfate 1 g tablet Commonly known as:  CARAFATE Take 1 g by mouth 2 (two) times daily.   SYSTANE OP Apply 1 drop to eye 5 (five) times daily as needed (dry eyes).   zolpidem 10 MG tablet Commonly known as:  AMBIEN Take 10 mg by mouth at bedtime.         Signed: Ventura Bruns 09/28/2017, 10:15 AM  [facility] is now [facility] [address] [place], [place], [place] [address] Phone: [place]

## 2017-09-28 NOTE — Social Work (Signed)
CSW met with patient at bedside. CSW advised that since PT evaluation happened this morning, CSW just recently sent notes to facilities to review and should hear back shortly. CSW discussed SNF placement at [facility] and patient indicated that the facility may be to far for his significant other to get to and he would like other SNF offers. CSW validated and agreed. CSW then explained that he will need Insurance Auth before he can go to a SNF. Pt and friend at bedside voiced understanding. CSW will f/u with patient once SNF bed offers are received.  CSW discussed with floor RN and advised that patient will not be able to go today as we are waiting for SNF offers since PT evaluation was completed today. Once offers received,  CSW will discuss with patient and then selected SNF needs to obtain Insurance Auth.  Elissa Hefty, LCSW Clinical Social Worker [phone]

## 2017-09-28 NOTE — Evaluation (Signed)
Physical Therapy Evaluation Patient Details Name: [name] [name] MRN: [id number] DOB: 28-Jan-1936 Today's Date: 09/28/2017   History of Present Illness  Pt is an 82 y.o. male s/p L shoulder reverse total shoulder arthroplasty and deltoid repair. PMH significant for arthritis, prostate cancer (1995), hypertension, mitral incompetence, peripheral vascular disease, and vertigo.  Clinical Impression  Pt admitted with above diagnosis. Pt currently with functional limitations due to the deficits listed below (see PT Problem List). At the time of PT eval, patient presents with decreased functional mobility secondary to diminished balance, strength, range of motion, and sensation. Patient lives alone and does not have adequate support to safely discharge home due to difficulty/inability to perform ADL's and safe mobilization. Recommend skilled nursing facility for post-acute therapy needs.     Follow Up Recommendations SNF    Equipment Recommendations  None recommended by PT    Recommendations for Other Services       Precautions / Restrictions Precautions Precautions: Shoulder Type of Shoulder Precautions: Conservative: per initial OT order, NWB L shoulder, no AROM/PROM L shoulder, elbow/wrist/hand AROM to tolerance. No information given concerning abduction sling wear in order thus following conservative protocol and maintaining sling at all times until cleared by MD.  Shoulder Interventions: Shoulder abduction pillow;At all times;Shoulder sling/immobilizer Precaution Booklet Issued: Yes (comment) Restrictions Weight Bearing Restrictions: Yes LUE Weight Bearing: Non weight bearing      Mobility  Bed Mobility Overal bed mobility: Needs Assistance Bed Mobility: Supine to Sit     Supine to sit: Min assist;HOB elevated     General bed mobility comments: Min assist at R UE to power up trunk from [place] Overall transfer level: Needs assistance Equipment used: None;Straight  cane Transfers: Sit to/from Stand Sit to Stand: Min guard         General transfer comment: Min guard assist for safety.   Ambulation/Gait Ambulation/Gait assistance: Min assist Ambulation Distance (Feet): 25 Feet Assistive device: None;Straight cane Gait Pattern/deviations: Narrow base of support;Step-to pattern   Gait velocity interpretation: Below normal speed for age/gender General Gait Details: Patient ambulated 25 feet with no device and min assist for safety. Displayed narrow base of support and significant lateral sway. After a seated break, patient then ambulated 25 feet with a SPC and min guard with decreased lateral sway.     Stairs            Wheelchair Mobility    Modified Rankin (Stroke Patients Only)       Balance Overall balance assessment: Needs assistance Sitting-balance support: Feet supported;No upper extremity supported Sitting balance-Leahy Scale: Good     Standing balance support: No upper extremity supported Standing balance-Leahy Scale: Fair                               Pertinent Vitals/Pain Pain Assessment: 0-10 Pain Score: 2  Pain Location: L shoulder Pain Descriptors / Indicators: Discomfort;Operative site guarding Pain Intervention(s): Monitored during session    Home Living Family/patient expects to be discharged to:: Skilled nursing facility Living Arrangements: Alone               Additional Comments: Pt will have assistance from significant other following D/C from SNF.     Prior Function Level of Independence: Independent               Hand Dominance   Dominant Hand: Left    Extremity/Trunk Assessment   Upper Extremity Assessment  Upper Extremity Assessment: RUE deficits/detail;LUE deficits/detail RUE Deficits / Details: MMT: Shoulder flexion 3+/5, elbow flexion 4/5, elbow extension 5/5 LUE: Unable to fully assess due to immobilization    Lower Extremity Assessment Lower Extremity  Assessment: RLE deficits/detail;LLE deficits/detail RLE Deficits / Details: MMT: hip flexion 5/5, knee extension 5/5, ankle dorsiflexion 5/5 RLE Sensation: WNL;history of peripheral neuropathy LLE Deficits / Details: MMT: hip flexion 5/5, knee extension 5/5, ankle dorsiflexion 4/5 LLE Sensation: WNL;history of peripheral neuropathy    Cervical / Trunk Assessment Cervical / Trunk Assessment: Normal  Communication   Communication: No difficulties  Cognition Arousal/Alertness: Awake/alert Behavior During Therapy: WFL for tasks assessed/performed Overall Cognitive Status: Within Functional Limits for tasks assessed                                        General Comments General comments (skin integrity, edema, etc.): Significant other present throughout session. Educated on proper footwear and skin inspections due to peripheral neuropathy.    Exercises     Assessment/Plan    PT Assessment Patient needs continued PT services  PT Problem List Decreased strength;Decreased range of motion;Decreased balance;Decreased mobility;Impaired sensation       PT Treatment Interventions DME instruction;Gait training;Stair training;Functional mobility training;Therapeutic activities;Therapeutic exercise;Balance training;Patient/family education    PT Goals (Current goals can be found in the Care Plan section)  Acute Rehab PT Goals Patient Stated Goal: to go to rehab PT Goal Formulation: With patient Time For Goal Achievement: 10/03/17 Potential to Achieve Goals: Good    Frequency Min 3X/week   Barriers to discharge        Co-evaluation               AM-PAC PT "6 Clicks" Daily Activity  Outcome Measure Difficulty turning over in bed (including adjusting bedclothes, sheets and blankets)?: A Little Difficulty moving from lying on back to sitting on the side of the bed? : Unable Difficulty sitting down on and standing up from a chair with arms (e.g., wheelchair,  bedside commode, etc,.)?: A Little Help needed moving to and from a bed to chair (including a wheelchair)?: A Little Help needed walking in hospital room?: A Little Help needed climbing 3-5 steps with a railing? : A Lot 6 Click Score: 15    End of Session Equipment Utilized During Treatment: Gait belt Activity Tolerance: Patient tolerated treatment well Patient left: in chair;with call bell/phone within reach;with family/visitor present   PT Visit Diagnosis: Unsteadiness on feet (R26.81);Muscle weakness (generalized) (M62.81);Pain Pain - Right/Left: Left Pain - part of body: Shoulder    Time: 8786-7672 PT Time Calculation (min) (ACUTE ONLY): 26 min   Charges:   PT Evaluation $PT Eval Moderate Complexity: 1 Mod PT Treatments $Gait Training: 8-22 mins   PT G Codes:        Ellamae Sia, PT, DPT Acute Rehabilitation Services  Pager: [phone]  Willy Eddy 09/28/2017, 11:15 AM

## 2017-09-29 DIAGNOSIS — I2510 Atherosclerotic heart disease of native coronary artery without angina pectoris: Secondary | ICD-10-CM | POA: Diagnosis not present

## 2017-09-29 DIAGNOSIS — R278 Other lack of coordination: Secondary | ICD-10-CM | POA: Diagnosis not present

## 2017-09-29 DIAGNOSIS — Z96612 Presence of left artificial shoulder joint: Secondary | ICD-10-CM | POA: Diagnosis not present

## 2017-09-29 DIAGNOSIS — M6281 Muscle weakness (generalized): Secondary | ICD-10-CM | POA: Diagnosis not present

## 2017-09-29 DIAGNOSIS — Z4789 Encounter for other orthopedic aftercare: Secondary | ICD-10-CM | POA: Diagnosis not present

## 2017-09-29 DIAGNOSIS — S8002XA Contusion of left knee, initial encounter: Secondary | ICD-10-CM | POA: Diagnosis not present

## 2017-09-29 DIAGNOSIS — G8911 Acute pain due to trauma: Secondary | ICD-10-CM | POA: Diagnosis not present

## 2017-09-29 DIAGNOSIS — R2681 Unsteadiness on feet: Secondary | ICD-10-CM | POA: Diagnosis not present

## 2017-09-29 DIAGNOSIS — M6389 Disorders of muscle in diseases classified elsewhere, multiple sites: Secondary | ICD-10-CM | POA: Diagnosis not present

## 2017-09-29 DIAGNOSIS — R079 Chest pain, unspecified: Secondary | ICD-10-CM | POA: Diagnosis not present

## 2017-09-29 MED ORDER — DIPHENHYDRAMINE HCL 25 MG PO CAPS
25.0000 mg | ORAL_CAPSULE | Freq: Four times a day (QID) | ORAL | Status: DC | PRN
  Administered 2017-09-29: 25 mg via ORAL
  Filled 2017-09-29: qty 1

## 2017-09-29 NOTE — Social Work (Signed)
CSW f/u with SNF and they indicated that they have not received Insurance Auth yet for SNF placement.  CSW still f/u.  Elissa Hefty, LCSW Clinical Social Worker [phone]

## 2017-09-29 NOTE — Progress Notes (Signed)
Occupational Therapy Treatment Patient Details Name: [name] [name] MRN: [id number] DOB: Jun 18, 1936 Today's Date: 09/29/2017    History of present illness Pt is an 82 y.o. male s/p L shoulder reverse total shoulder arthroplasty and deltoid repair. PMH significant for arthritis, prostate cancer (1995), hypertension, mitral incompetence, peripheral vascular disease, and vertigo.   OT comments  Pt demonstrating improvement toward OT goals this session. Pt was able to complete UB dressing and bathing tasks with max assist for sling management this session. Pt educated concerning importance of maintaining slinged position of L UE by supporting it with his R hand during dressing tasks. Pt reports plan to D/C to SNF today and continue to feel this is appropriate due to continued difficulty with ADL participation.    Follow Up Recommendations  SNF;Follow surgeon's recommendation for DC plan and follow-up therapies    Equipment Recommendations  Other (comment)(defer to next venue of care)    Recommendations for Other Services      Precautions / Restrictions Precautions Precautions: Shoulder Type of Shoulder Precautions: Conservative: per initial OT order, NWB L shoulder, no AROM/PROM L shoulder, elbow/wrist/hand AROM to tolerance. No information given concerning abduction sling wear in order thus following conservative protocol and maintaining sling at all times until cleared by MD.  Shoulder Interventions: Shoulder abduction pillow;At all times;Shoulder sling/immobilizer Precaution Booklet Issued: Yes (comment) Precaution Comments: see above; initial OT order cancelled and 3/17 order with no specific details. Maintaining sling during elbow, forearm, wrist, and hand AROM and limited to confines of sling this session Restrictions Weight Bearing Restrictions: Yes LUE Weight Bearing: Non weight bearing       Mobility Bed Mobility Overal bed mobility: Needs Assistance Bed Mobility: Supine to  Sit     Supine to sit: HOB elevated;Min guard Sit to supine: Min guard   General bed mobility comments: Min guard assist for safety.   Transfers Overall transfer level: Needs assistance Equipment used: Straight cane Transfers: Sit to/from Stand Sit to Stand: Supervision;Min guard         General transfer comment: Min guard initially but progressing well.     Balance Overall balance assessment: Needs assistance Sitting-balance support: Feet supported;No upper extremity supported Sitting balance-Leahy Scale: Good     Standing balance support: No upper extremity supported Standing balance-Leahy Scale: Fair Standing balance comment: Benefits from cane.                            ADL either performed or assessed with clinical judgement   ADL Overall ADL's : Needs assistance/impaired Eating/Feeding: Sitting;Set up   Grooming: Standing;Supervision/safety;With caregiver independent assisting   Upper Body Bathing: Sitting;Maximal assistance   Lower Body Bathing: Minimal assistance;Sit to/from stand   Upper Body Dressing : Sitting;Maximal assistance   Lower Body Dressing: Minimal assistance;Sit to/from stand   Toilet Transfer: Supervision/safety;Min guard;Ambulation(cane) Toilet Transfer Details (indicate cue type and reason): At first min guard due to instability but able to progress well.  Toileting- Clothing Manipulation and Hygiene: Sit to/from stand;Minimal assistance       Functional mobility during ADLs: Supervision/safety;Min guard;Cane General ADL Comments: Pt with improved stability with cane. Educated concerning safe dressing methods and maintaining shoulder positioning while completing. Pt able to doff sling, don shirt, and doff sling while maintaining slight shoulder abducted positioning with max assist.      Vision   Vision Assessment?: No apparent visual deficits   Perception     Praxis  Cognition Arousal/Alertness:  Awake/alert Behavior During Therapy: WFL for tasks assessed/performed Overall Cognitive Status: Within Functional Limits for tasks assessed                                          Exercises Exercises: Shoulder;Other exercises Shoulder Exercises Wrist Flexion: AROM;Left;10 reps;Seated Wrist Extension: AROM;10 reps;Seated;Left Digit Composite Flexion: AROM;Left;10 reps;Seated Composite Extension: AROM;Left;10 reps;Seated Other Exercises Other Exercises: AROM L elbow with extension limited to 90 degrees while completing in sling.  Other Exercises: Forearm supination/pronation AROM within sling.   Shoulder Instructions Shoulder Instructions Donning/doffing shirt without moving shoulder: Maximal assistance Method for sponge bathing under operated UE: Maximal assistance Donning/doffing sling/immobilizer: Maximal assistance Correct positioning of sling/immobilizer: Maximal assistance Pendulum exercises (written home exercise program): (not indicated) ROM for elbow, wrist and digits of operated UE: Supervision/safety Sling wearing schedule (on at all times/off for ADL's): Supervision/safety Proper positioning of operated UE when showering: Maximal assistance Positioning of UE while sleeping: Minimal assistance     General Comments      Pertinent Vitals/ Pain       Pain Assessment: Faces Faces Pain Scale: Hurts a little bit Pain Location: L shoulder Pain Descriptors / Indicators: Discomfort;Operative site guarding Pain Intervention(s): Monitored during session  Home Living                                          Prior Functioning/Environment              Frequency  Min 3X/week        Progress Toward Goals  OT Goals(current goals can now be found in the care plan section)  Progress towards OT goals: Progressing toward goals  Acute Rehab OT Goals Patient Stated Goal: to go to rehab OT Goal Formulation: With patient/family Time  For Goal Achievement: 10/11/17 Potential to Achieve Goals: Good  Plan Discharge plan needs to be updated    Co-evaluation                 AM-PAC PT "6 Clicks" Daily Activity     Outcome Measure   Help from another person eating meals?: A Little Help from another person taking care of personal grooming?: A Lot Help from another person toileting, which includes using toliet, bedpan, or urinal?: A Little Help from another person bathing (including washing, rinsing, drying)?: A Lot Help from another person to put on and taking off regular upper body clothing?: A Lot Help from another person to put on and taking off regular lower body clothing?: A Lot 6 Click Score: 14    End of Session Equipment Utilized During Treatment: Other (comment)(L shoulder abduction pillow and splint)  OT Visit Diagnosis: Other abnormalities of gait and mobility (R26.89);Pain Pain - Right/Left: Left Pain - part of body: Shoulder   Activity Tolerance Patient tolerated treatment well   Patient Left in bed;with call bell/phone within reach;with family/visitor present   Nurse Communication Mobility status        Time: 0932-3557 OT Time Calculation (min): 21 min  Charges: OT General Charges $OT Visit: 1 Visit OT Treatments $Self Care/Home Management : 8-22 mins  Norman Herrlich, MS OTR/L  Pager: Clayton A Dago Jungwirth 09/29/2017, 9:37 AM

## 2017-09-29 NOTE — Clinical Social Work Placement (Signed)
   CLINICAL SOCIAL WORK PLACEMENT  NOTE  Date:  09/29/2017  Patient Details  Name: [name] [name] MRN: [id number] Date of Birth: Nov 03, 1935  Clinical Social Work is seeking post-discharge placement for this patient at the [place] level of care (*CSW will initial, date and re-position this form in  chart as items are completed):  Yes   Patient/family provided with [place] Work Department's list of facilities offering this level of care within the geographic area requested by the patient (or if unable, by the patient's family).  Yes   Patient/family informed of their freedom to choose among providers that offer the needed level of care, that participate in Medicare, Medicaid or managed care program needed by the patient, have an available bed and are willing to accept the patient.  Yes   Patient/family informed of [facility]'s ownership interest in [facility] and [facility], as well as of the fact that they are under no obligation to receive care at these facilities.  PASRR submitted to EDS on       PASRR number received on 09/28/17     Existing PASRR number confirmed on       FL2 transmitted to all facilities in geographic area requested by pt/family on 09/28/17     FL2 transmitted to all facilities within larger geographic area on       Patient informed that his/her managed care company has contracts with or will negotiate with certain facilities, including the following:        Yes   Patient/family informed of bed offers received.  Patient chooses bed at [facility], [place]     Physician recommends and patient chooses bed at      Patient to be transferred to [place] [place] on 09/29/17.  Patient to be transferred to facility by [facility]     Patient family notified on 09/29/17 of transfer.  Name of family member notified:  patient responsible for self, family at bedside     PHYSICIAN       Additional Comment:     _______________________________________________ Normajean Baxter, LCSW 09/29/2017, 3:01 PM

## 2017-09-29 NOTE — Care Management Important Message (Signed)
Important Message  Patient Details  Name: [name] [name] MRN: [id number] Date of Birth: 14-Oct-1935   Medicare Important Message Given:  Yes    Clayton Jarmon Montine Circle 09/29/2017, 3:51 PM

## 2017-09-29 NOTE — Progress Notes (Signed)
  Orthopedics Progress Note  Subjective: No complaints this AM  Objective:  Vitals:   09/28/17 2132 09/29/17 0450  BP: (!) 136/55 (!) 119/58  Pulse: 70 72  Resp:    Temp: 98.6 F (37 C) 98.9 F (37.2 C)  SpO2: 95% 95%    General: Awake and alert  Musculoskeletal: Left shoulder dressing CDI, no swelling in the arm or hand Neurovascularly intact  Lab Results  Component Value Date   WBC 5.0 09/16/2017   HGB 13.7 09/16/2017   HCT 40.8 09/16/2017   MCV 90.3 09/16/2017   PLT 156 09/16/2017       Component Value Date/Time   NA 137 09/16/2017 1332   K 4.2 09/16/2017 1332   CL 104 09/16/2017 1332   CO2 21 (L) 09/16/2017 1332   GLUCOSE 121 (H) 09/16/2017 1332   BUN 13 09/16/2017 1332   CREATININE 0.84 09/16/2017 1332   CALCIUM 9.0 09/16/2017 1332   GFRNONAA >60 09/16/2017 1332   GFRAA >60 09/16/2017 1332    No results found for: INR, PROTIME  Assessment/Plan: POD #4 s/p Procedure(s): LEFT REVERSE SHOULDER ARTHROPLASTY; deltoid repair Discharge to SNF  Remo Lipps R. Veverly Fells, MD 09/29/2017 7:31 AM

## 2017-09-29 NOTE — Social Work (Signed)
SNF contacted CSW and indicated that they have received Insurance Auth.  CSW will f/u for disposition.  Elissa Hefty, LCSW Clinical Social Worker [phone]

## 2017-09-29 NOTE — Progress Notes (Addendum)
Report called and given to [facility] at [facility].  Pt's belongings gathered and sent to SNF with him, everything else friend took home. Pt in no distress at time of discharge.

## 2017-09-29 NOTE — Social Work (Signed)
Clinical Social Worker facilitated patient discharge including contacting patient family and facility to confirm patient discharge plans.  Clinical information faxed to facility and family agreeable with plan.    CSW arranged ambulance transport via [facility] to [facility].    RN to call [phone] to give report prior to discharge. Pt going to Room 103B.  Clinical Social Worker will sign off for now as social work intervention is no longer needed. Please consult [place] again if new need arises.  Elissa Hefty, LCSW Clinical Social Worker [phone]

## 2017-09-30 ENCOUNTER — Encounter (HOSPITAL_COMMUNITY): Payer: Self-pay | Admitting: Orthopedic Surgery

## 2017-10-08 DIAGNOSIS — Z96612 Presence of left artificial shoulder joint: Secondary | ICD-10-CM | POA: Diagnosis not present

## 2017-10-08 DIAGNOSIS — Z4789 Encounter for other orthopedic aftercare: Secondary | ICD-10-CM | POA: Insufficient documentation

## 2017-10-08 HISTORY — DX: Encounter for other orthopedic aftercare: Z47.89

## 2017-11-04 DIAGNOSIS — E538 Deficiency of other specified B group vitamins: Secondary | ICD-10-CM | POA: Diagnosis not present

## 2017-11-05 DIAGNOSIS — Z4789 Encounter for other orthopedic aftercare: Secondary | ICD-10-CM | POA: Diagnosis not present

## 2017-11-25 DIAGNOSIS — H43811 Vitreous degeneration, right eye: Secondary | ICD-10-CM | POA: Diagnosis not present

## 2017-11-25 DIAGNOSIS — H01025 Squamous blepharitis left lower eyelid: Secondary | ICD-10-CM | POA: Diagnosis not present

## 2017-11-25 DIAGNOSIS — H40013 Open angle with borderline findings, low risk, bilateral: Secondary | ICD-10-CM | POA: Diagnosis not present

## 2017-11-25 DIAGNOSIS — H10413 Chronic giant papillary conjunctivitis, bilateral: Secondary | ICD-10-CM | POA: Diagnosis not present

## 2017-11-25 DIAGNOSIS — H01022 Squamous blepharitis right lower eyelid: Secondary | ICD-10-CM | POA: Diagnosis not present

## 2017-11-25 DIAGNOSIS — R197 Diarrhea, unspecified: Secondary | ICD-10-CM | POA: Diagnosis not present

## 2017-11-25 DIAGNOSIS — Z86010 Personal history of colonic polyps: Secondary | ICD-10-CM | POA: Diagnosis not present

## 2017-11-25 DIAGNOSIS — H2513 Age-related nuclear cataract, bilateral: Secondary | ICD-10-CM | POA: Diagnosis not present

## 2017-11-25 DIAGNOSIS — E559 Vitamin D deficiency, unspecified: Secondary | ICD-10-CM | POA: Diagnosis not present

## 2017-11-25 DIAGNOSIS — G629 Polyneuropathy, unspecified: Secondary | ICD-10-CM | POA: Diagnosis not present

## 2017-11-25 DIAGNOSIS — H01021 Squamous blepharitis right upper eyelid: Secondary | ICD-10-CM | POA: Diagnosis not present

## 2017-11-25 DIAGNOSIS — H01024 Squamous blepharitis left upper eyelid: Secondary | ICD-10-CM | POA: Diagnosis not present

## 2017-11-25 DIAGNOSIS — H16223 Keratoconjunctivitis sicca, not specified as Sjogren's, bilateral: Secondary | ICD-10-CM | POA: Diagnosis not present

## 2017-11-25 DIAGNOSIS — I129 Hypertensive chronic kidney disease with stage 1 through stage 4 chronic kidney disease, or unspecified chronic kidney disease: Secondary | ICD-10-CM | POA: Diagnosis not present

## 2017-11-25 DIAGNOSIS — M1990 Unspecified osteoarthritis, unspecified site: Secondary | ICD-10-CM | POA: Diagnosis not present

## 2017-11-26 DIAGNOSIS — R197 Diarrhea, unspecified: Secondary | ICD-10-CM | POA: Diagnosis not present

## 2017-12-08 DIAGNOSIS — G4700 Insomnia, unspecified: Secondary | ICD-10-CM | POA: Diagnosis not present

## 2017-12-08 DIAGNOSIS — E559 Vitamin D deficiency, unspecified: Secondary | ICD-10-CM | POA: Diagnosis not present

## 2017-12-08 DIAGNOSIS — Z0000 Encounter for general adult medical examination without abnormal findings: Secondary | ICD-10-CM | POA: Diagnosis not present

## 2017-12-08 DIAGNOSIS — E538 Deficiency of other specified B group vitamins: Secondary | ICD-10-CM | POA: Diagnosis not present

## 2017-12-08 DIAGNOSIS — G629 Polyneuropathy, unspecified: Secondary | ICD-10-CM | POA: Diagnosis not present

## 2017-12-08 DIAGNOSIS — E785 Hyperlipidemia, unspecified: Secondary | ICD-10-CM | POA: Diagnosis not present

## 2017-12-08 DIAGNOSIS — C61 Malignant neoplasm of prostate: Secondary | ICD-10-CM | POA: Diagnosis not present

## 2017-12-08 DIAGNOSIS — I129 Hypertensive chronic kidney disease with stage 1 through stage 4 chronic kidney disease, or unspecified chronic kidney disease: Secondary | ICD-10-CM | POA: Diagnosis not present

## 2018-02-24 DIAGNOSIS — Z951 Presence of aortocoronary bypass graft: Secondary | ICD-10-CM

## 2018-02-24 DIAGNOSIS — E538 Deficiency of other specified B group vitamins: Secondary | ICD-10-CM | POA: Insufficient documentation

## 2018-02-24 DIAGNOSIS — G5730 Lesion of lateral popliteal nerve, unspecified lower limb: Secondary | ICD-10-CM | POA: Insufficient documentation

## 2018-02-24 DIAGNOSIS — G629 Polyneuropathy, unspecified: Secondary | ICD-10-CM | POA: Insufficient documentation

## 2018-02-24 DIAGNOSIS — H409 Unspecified glaucoma: Secondary | ICD-10-CM | POA: Insufficient documentation

## 2018-02-24 DIAGNOSIS — E559 Vitamin D deficiency, unspecified: Secondary | ICD-10-CM | POA: Insufficient documentation

## 2018-02-24 HISTORY — DX: Presence of aortocoronary bypass graft: Z95.1

## 2018-05-28 DIAGNOSIS — H538 Other visual disturbances: Secondary | ICD-10-CM

## 2018-05-28 HISTORY — DX: Other visual disturbances: H53.8

## 2018-07-20 DIAGNOSIS — G5730 Lesion of lateral popliteal nerve, unspecified lower limb: Secondary | ICD-10-CM | POA: Insufficient documentation

## 2018-09-12 HISTORY — PX: LEG SURGERY: SHX1003

## 2019-02-13 DIAGNOSIS — M4727 Other spondylosis with radiculopathy, lumbosacral region: Secondary | ICD-10-CM | POA: Insufficient documentation

## 2019-02-13 DIAGNOSIS — M5442 Lumbago with sciatica, left side: Secondary | ICD-10-CM | POA: Insufficient documentation

## 2019-05-01 DIAGNOSIS — H9319 Tinnitus, unspecified ear: Secondary | ICD-10-CM | POA: Insufficient documentation

## 2019-05-01 DIAGNOSIS — I252 Old myocardial infarction: Secondary | ICD-10-CM | POA: Insufficient documentation

## 2019-05-01 DIAGNOSIS — H81399 Other peripheral vertigo, unspecified ear: Secondary | ICD-10-CM | POA: Insufficient documentation

## 2019-05-01 DIAGNOSIS — H81319 Aural vertigo, unspecified ear: Secondary | ICD-10-CM | POA: Insufficient documentation

## 2019-05-01 DIAGNOSIS — I2510 Atherosclerotic heart disease of native coronary artery without angina pectoris: Secondary | ICD-10-CM | POA: Insufficient documentation

## 2019-05-01 DIAGNOSIS — H8109 Meniere's disease, unspecified ear: Secondary | ICD-10-CM | POA: Insufficient documentation

## 2019-05-01 HISTORY — DX: Old myocardial infarction: I25.2

## 2019-06-16 ENCOUNTER — Ambulatory Visit: Payer: Medicare HMO | Admitting: Physical Therapy

## 2019-06-23 ENCOUNTER — Ambulatory Visit (INDEPENDENT_AMBULATORY_CARE_PROVIDER_SITE_OTHER)
Admit: 2019-06-23 | Discharge: 2019-06-23 | Disposition: A | Discharge status: Home / Self Care | Payer: Medicare HMO | Attending: Family Medicine | Admitting: Family Medicine

## 2019-06-23 ENCOUNTER — Ambulatory Visit
Admission: EM | Admit: 2019-06-23 | Discharge: 2019-06-23 | Disposition: A | Discharge status: Home / Self Care | Payer: Medicare HMO | Attending: Emergency Medicine | Admitting: Emergency Medicine

## 2019-06-23 ENCOUNTER — Encounter: Payer: Self-pay | Admitting: Emergency Medicine

## 2019-06-23 ENCOUNTER — Ambulatory Visit: Payer: Medicare HMO | Admitting: Physical Therapy

## 2019-06-23 ENCOUNTER — Other Ambulatory Visit: Payer: Self-pay

## 2019-06-23 DIAGNOSIS — W19XXXA Unspecified fall, initial encounter: Secondary | ICD-10-CM

## 2019-06-23 DIAGNOSIS — S0083XA Contusion of other part of head, initial encounter: Secondary | ICD-10-CM | POA: Diagnosis not present

## 2019-06-23 DIAGNOSIS — S0990XA Unspecified injury of head, initial encounter: Secondary | ICD-10-CM

## 2019-06-23 NOTE — ED Triage Notes (Signed)
Patient in today after falling on 06/22/19 and hitting his head on concrete. Patient c/o headache.

## 2019-06-23 NOTE — ED Provider Notes (Signed)
[facility] ____________________________________________  Time seen: Approximately 1:05 PM  I have reviewed the triage vital signs and the nursing notes.   HISTORY  Chief Complaint Fall (DOI 06/22/19) and Headache   HPI [name] [name] is a 83 y.o. male presenting for evaluation post fall with head injury.  Patient reports he has bilateral drop feet and peripheral neuropathy and he uses a cane to ambulate.  Patient reports yesterday after taking his dog to the pet store, he had put the dog in the car but set his cane in the back of the car with the dog, and when walking around he tripped and fell forward.  States that he believes his foot caught the concrete causing him to fall.  States he fell directly to the left side of his head and body.  Denies loss of consciousness.  Denies immediate onset of headache.  Reports later last night he felt soreness and a mild headache.  Patient reports mild headache located to the front forehead at this time where he had a bruise.  Patient states he often has headaches in the morning with his chronic seasonal allergies and states no change in that headache this morning.  Did take Tylenol last night and this morning which he states is his normal regimen and it did help.  States otherwise he generally feels sore but denies other pain.  Denies loss of consciousness, dizziness, acute vision changes, acute neck or back pain, extremity pain, confusion, unilateral weakness, acute paresthesias.  No recent sickness.  Reports tetanus immunizations up-to-date.  Takes 81 mg aspirin daily.  No other blood thinner or anticoagulant.  Jani Gravel, MD : PCP   Past Medical History:  Diagnosis Date   Arthritis    Cancer [facility]) 1995   prostate ca    Coronary artery disease    Esophageal stricture 08/2016   GERD (gastroesophageal reflux disease)    Glaucoma    History of hiatal hernia    Hypertension    MI (mitral incompetence)    Peripheral  neuropathy    Peripheral vascular disease ([place])    Vertigo     Patient Active Problem List   Diagnosis Date Noted   S/P shoulder replacement, left 09/25/2017    Past Surgical History:  Procedure Laterality Date   COLONOSCOPY     CORONARY ANGIOPLASTY WITH STENT PLACEMENT  2005   x9    CORONARY ARTERY BYPASS GRAFT  2011   CORONARY STENT PLACEMENT  07/2014   ESOPHAGOGASTRODUODENOSCOPY     ESOPHAGOGASTRODUODENOSCOPY (EGD) WITH ESOPHAGEAL DILATION  2018   knee scope Bilateral    LIPOMA EXCISION N/A 09/24/2016   Procedure: EXCISION SUBCUTANEOUS LIPOMA ON UPPER BACK;  Surgeon: Donnie Mesa, MD;  Location: [place];  Service: General;  Laterality: N/A;   [place] Left 09/25/2017   REVERSE SHOULDER ARTHROPLASTY Left 09/25/2017   Procedure: LEFT REVERSE SHOULDER ARTHROPLASTY; deltoid repair;  Surgeon: Netta Cedars, MD;  Location: [place];  Service: Orthopedics;  Laterality: Left;   ROTATOR CUFF REPAIR Left 2001     No current facility-administered medications for this encounter.  Current Outpatient Medications:    acetaminophen (TYLENOL) 650 MG CR tablet, Take 1,300 mg by mouth 2 (two) times daily. , Disp: , Rfl:    Alirocumab (PRALUENT) 75 MG/ML SOSY, Inject 75 mg into the skin every 14 (fourteen) days. , Disp: , Rfl:    amLODipine (NORVASC) 5 MG tablet, Take 5 mg by mouth daily., Disp: ,  Rfl:    aspirin EC 81 MG tablet, Take 81 mg by mouth daily., Disp: , Rfl:    azelastine (ASTELIN) 0.1 % nasal spray, Place 1 spray into both nostrils 2 (two) times daily. Use in each nostril as directed, Disp: , Rfl:    calcium carbonate (TUMS - DOSED IN MG ELEMENTAL CALCIUM) 500 MG chewable tablet, Chew 2 tablets by mouth daily as needed for indigestion or heartburn., Disp: , Rfl:    cetirizine (ZYRTEC) 10 MG tablet, Take 10 mg by mouth daily., Disp: , Rfl:    cholecalciferol (VITAMIN D) 1000 units tablet, Take 1,000 Units by mouth daily.,  Disp: , Rfl:    Cyanocobalamin (B-12) 1000 MCG/ML KIT, Inject 1 mL as directed every 30 (thirty) days., Disp: , Rfl:    cycloSPORINE (RESTASIS) 0.05 % ophthalmic emulsion, Place 1 drop into both eyes 2 (two) times daily., Disp: , Rfl:    Infant Care Products (BABY SHAMPOO EX), Rub into a wash cloth and rub into the eyes 3 to 4 times daily for dry eyes / eye build up, Disp: , Rfl:    latanoprost (XALATAN) 0.005 % ophthalmic solution, Place 1 drop into both eyes at bedtime., Disp: , Rfl: 0   lisinopril (PRINIVIL,ZESTRIL) 20 MG tablet, Take 20 mg by mouth 2 (two) times daily. , Disp: , Rfl:    metoprolol tartrate (LOPRESSOR) 25 MG tablet, Take 12.5 mg by mouth 2 (two) times daily. , Disp: , Rfl:    nitroGLYCERIN (NITROSTAT) 0.4 MG SL tablet, Place 0.4 mg under the tongue every 5 (five) minutes as needed for chest pain., Disp: , Rfl:    Olopatadine HCl 0.2 % SOLN, Place 1 drop into both eyes daily., Disp: , Rfl:    oxyCODONE-acetaminophen (PERCOCET/ROXICET) 5-325 MG tablet, Take 1 tablet by mouth every 4 (four) hours as needed for severe pain., Disp: 60 tablet, Rfl: 0   pantoprazole (PROTONIX) 40 MG tablet, Take 40 mg by mouth every morning. , Disp: , Rfl:    Polyethyl Glycol-Propyl Glycol (SYSTANE OP), Apply 1 drop to eye 5 (five) times daily as needed (dry eyes)., Disp: , Rfl:    sucralfate (CARAFATE) 1 g tablet, Take 1 g by mouth 2 (two) times daily. , Disp: , Rfl:    zolpidem (AMBIEN) 10 MG tablet, Take 10 mg by mouth at bedtime. , Disp: , Rfl:   Allergies Atorvastatin, Rosuvastatin, Statins, Folic acid-vit L8-XQJ J94, Hydrocodone, and Pravastatin  Family History  Problem Relation Age of Onset   Alzheimer's disease Mother    Stroke Father    Parkinson's disease Sister    Cancer Sister     Social History Social History   Tobacco Use   Smoking status: Never Smoker   Smokeless tobacco: Never Used  Substance Use Topics   Alcohol use: Yes    Alcohol/week: 4.0 standard  drinks    Types: 4 Glasses of wine per week    Comment: wine with dinner   Drug use: No    Review of Systems Constitutional: No fever Eyes: No visual changes. ENT: No sore throat. Cardiovascular: Denies chest pain. Respiratory: Denies shortness of breath. Gastrointestinal: No abdominal pain.  No nausea, no vomiting.  No diarrhea.  Musculoskeletal: Negative atypical back pain. Skin: Positive break in skin. Neurological: Positive for headaches.  Negative for focal weakness or numbness.    ____________________________________________   PHYSICAL EXAM:  VITAL SIGNS: ED Triage Vitals  Enc Vitals Group     BP 06/23/19 1229 127/66  Pulse Rate 06/23/19 1229 (!) 58     Resp 06/23/19 1229 18     Temp 06/23/19 1229 98.2 F (36.8 C)     Temp Source 06/23/19 1229 Oral     SpO2 06/23/19 1229 98 %     Weight 06/23/19 1229 196 lb (88.9 kg)     Height 06/23/19 1229 6' (1.829 m)     Head Circumference --      Peak Flow --      Pain Score 06/23/19 1228 4     Pain Loc --      Pain Edu? --      Excl. in [place]? --     Constitutional: Alert and oriented. Well appearing and in no acute distress. Eyes: Conjunctivae are normal. PERRL. EOMI. ENT      Head: Normocephalic.  Left lateral forehead superficial abrasions without edema, with tenderness mildly to direct palpation.  Left lateral and inferior orbit mild tenderness to direct palpation with superficial abrasion, no pain with EOMs, no edema.      Nose: No congestion Cardiovascular: Normal rate, regular rhythm. Grossly normal heart sounds.  Good peripheral circulation. Respiratory: Normal respiratory effort without tachypnea nor retractions. Breath sounds are clear and equal bilaterally. No wheezes, rales, rhonchi. Musculoskeletal:  Normal range of motion in all extremities.  Neurologic:  Normal speech and language. No gross focal neurologic deficits are appreciated. Speech is normal.  No ataxia.  Normal finger-to-nose.  Negative pronator  drift.  Negative Romberg.  No paresthesias. Skin:  Skin is warm, dry. Psychiatric: Mood and affect are normal. Speech and behavior are normal. Patient exhibits appropriate insight and judgment   ___________________________________________   LABS (all labs ordered are listed, but only abnormal results are displayed)  Labs Reviewed - No data to display  RADIOLOGY  CT Head Wo Contrast  Result Date: 06/23/2019 CLINICAL DATA:  Fall, headache EXAM: CT HEAD WITHOUT CONTRAST TECHNIQUE: Contiguous axial images were obtained from the base of the skull through the vertex without intravenous contrast. COMPARISON:  None. FINDINGS: Brain: There is no acute intracranial hemorrhage, mass-effect, or edema. Gray-white differentiation is preserved. There is no extra-axial fluid collection. Ventricles and sulci are within normal limits in size and configuration. Vascular: There is atherosclerotic calcification at the skull base. Skull: Calvarium is intact. Sinuses/Orbits: No acute finding. Other: None. IMPRESSION: No evidence of acute intracranial injury. Electronically Signed   By: Macy Mis M.D.   On: 06/23/2019 13:55   CT Maxillofacial Wo Contrast  Result Date: 06/23/2019 CLINICAL DATA:  Fall, facial trauma EXAM: CT MAXILLOFACIAL WITHOUT CONTRAST TECHNIQUE: Multidetector CT imaging of the maxillofacial structures was performed. Multiplanar CT image reconstructions were also generated. COMPARISON:  None. FINDINGS: Osseous: There is no acute fracture. Temporomandibular joints are unremarkable. Orbits: No intraorbital hematoma. Sinuses: Small maxillary sinus retention cysts. Soft tissues: Mild left facial soft tissue swelling. Limited intracranial: Dictated separately. IMPRESSION: No acute facial fracture. Electronically Signed   By: Macy Mis M.D.   On: 06/23/2019 13:48   ____________________________________________   PROCEDURES   INITIAL IMPRESSION / ASSESSMENT AND PLAN / ED COURSE  Pertinent  labs & imaging results that were available during my care of the patient were reviewed by me and considered in my medical decision making (see chart for details).  Well-appearing patient.  Presenting post fall with head injury.  Will evaluate CT head and CT maxillofacial.  CT head and maxillofacial as above radiologist, no acute intracranial abnormality and no acute facial fracture.  Patient doing  well.  Encouraged rest, fluids, supportive care, Tylenol as needed.  Discussed strict  follow-up and return parameters.  Discussed follow up with Primary care physician this week. Discussed follow up and return parameters including no resolution or any worsening concerns. Patient verbalized understanding and agreed to plan.   ____________________________________________   FINAL CLINICAL IMPRESSION(S) / ED DIAGNOSES  Final diagnoses:  Injury of head, initial encounter  Fall, initial encounter  Contusion of face, initial encounter     ED Discharge Orders    None       Note: This dictation was prepared with Dragon dictation along with smaller phrase technology. Any transcriptional errors that result from this process are unintentional.         Marylene Land, NP 06/23/19 1622

## 2019-06-23 NOTE — ED Notes (Signed)
CT Approval for [id number] and [phone] obtained from [facility] Approval # [id number] valid 06/23/2019-07/23/2019

## 2019-06-23 NOTE — Discharge Instructions (Signed)
Monitor. Rest. Drink plenty of fluids. Take over the counter tylenol as needed.   Follow up with your primary care physician this week. Return to Urgent care or Emergency room for new or worsening concerns.

## 2019-06-24 ENCOUNTER — Encounter: Payer: Self-pay | Admitting: Cardiology

## 2019-06-24 ENCOUNTER — Ambulatory Visit (INDEPENDENT_AMBULATORY_CARE_PROVIDER_SITE_OTHER): Payer: BLUE CROSS/BLUE SHIELD | Admitting: Cardiology

## 2019-06-24 VITALS — Ht 72.0 in | Wt 193.0 lb

## 2019-06-24 DIAGNOSIS — I2510 Atherosclerotic heart disease of native coronary artery without angina pectoris: Secondary | ICD-10-CM

## 2019-06-24 DIAGNOSIS — G6289 Other specified polyneuropathies: Secondary | ICD-10-CM

## 2019-06-24 DIAGNOSIS — E7800 Pure hypercholesterolemia, unspecified: Secondary | ICD-10-CM | POA: Diagnosis not present

## 2019-06-24 DIAGNOSIS — Z951 Presence of aortocoronary bypass graft: Secondary | ICD-10-CM | POA: Diagnosis not present

## 2019-06-24 NOTE — Progress Notes (Signed)
Primary Physician/Referring:  [name], FNP  Patient ID: [name] [name], male    DOB: 07-16-1935, 83 y.o.   MRN: [id number]  Chief Complaint  Patient presents with  . Coronary Artery Disease    ED Visit 12/10 for Fall  . Hypertension  . Follow-up   HPI:    [name] [name]  is a 83 y.o. Caucasian male with known CAD, hyperlipidemia, hypertension and esophageal stricture S/P dilatation remotely, multiple coronary interventions and eventually underwent CABG 2 in 2010. For NSTEMI, he has had SVG to D2 stent in 2010 and again in January 2017, patent stents by angiography in January 2018.  He has had 2 coronary intervention to SVG to D2 with placement of the proximal stent in 2010 and mid segment stent in January 2017 when he presented with NSTEMI  in [place].    Denies any chest pain, PND, or orthopnea.  He is presently doing well and denies any new symptoms.  Tolerating all his medications well.  States that his blood pressures also been well controlled.  His main complaints are chronic back pain, severe spinal stenosis and bilateral loss of coordination and loss of sensation in the feet.  He has been trying to avoid surgery due to his cardiac issues.  He has severe peripheral neuropathy, complete loss of sensation in both feet, and severe statin intolerance with rhabdomyolysis history in the past.  He has had a fall recently and was seen in the emergency room.  Past Medical History:  Diagnosis Date  . Arthritis   . Cancer [facility]) 1995   prostate ca   . Coronary artery disease   . Esophageal stricture 08/2016  . GERD (gastroesophageal reflux disease)   . Glaucoma   . History of hiatal hernia   . Hypertension   . MI (mitral incompetence)   . Peripheral neuropathy   . Peripheral vascular disease ([place])   . Vertigo    Past Surgical History:  Procedure Laterality Date  . COLONOSCOPY    . CORONARY ANGIOPLASTY WITH STENT PLACEMENT  2005   x9   . CORONARY ARTERY BYPASS  GRAFT  2011  . CORONARY STENT PLACEMENT  07/2014  . ESOPHAGOGASTRODUODENOSCOPY    . ESOPHAGOGASTRODUODENOSCOPY (EGD) WITH ESOPHAGEAL DILATION  2018  . knee scope Bilateral   . LEG SURGERY Right 09/2018  . LIPOMA EXCISION N/A 09/24/2016   Procedure: EXCISION SUBCUTANEOUS LIPOMA ON UPPER BACK;  Surgeon: Donnie Mesa, MD;  Location: [place];  Service: General;  Laterality: N/A;  . Prostectomy  1995  . REVERSE SHOULDER ARTHROPLASTY Left 09/25/2017  . REVERSE SHOULDER ARTHROPLASTY Left 09/25/2017   Procedure: LEFT REVERSE SHOULDER ARTHROPLASTY; deltoid repair;  Surgeon: Netta Cedars, MD;  Location: [place];  Service: Orthopedics;  Laterality: Left;  . ROTATOR CUFF REPAIR Left 2001   Social History   Socioeconomic History  . Marital status: Single    Spouse name: Not on file  . Number of children: 6  . Years of education: Not on file  . Highest education level: Not on file  Occupational History  . Not on file  Tobacco Use  . Smoking status: Never Smoker  . Smokeless tobacco: Never Used  Substance and Sexual Activity  . Alcohol use: Yes    Alcohol/week: 4.0 standard drinks    Types: 4 Glasses of wine per week    Comment: wine with dinner  . Drug use: No  . Sexual activity: Not on file  Other Topics Concern  . Not  on file  Social History Narrative  . Not on file   Social Determinants of Health   Financial Resource Strain:   . Difficulty of Paying Living Expenses: Not on file  Food Insecurity:   . Worried About Charity fundraiser in the Last Year: Not on file  . Ran Out of Food in the Last Year: Not on file  Transportation Needs:   . Lack of Transportation (Medical): Not on file  . Lack of Transportation (Non-Medical): Not on file  Physical Activity:   . Days of Exercise per Week: Not on file  . Minutes of Exercise per Session: Not on file  Stress:   . Feeling of Stress : Not on file  Social Connections:   . Frequency of Communication with Friends and Family: Not on file    . Frequency of Social Gatherings with Friends and Family: Not on file  . Attends Religious Services: Not on file  . Active Member of Clubs or Organizations: Not on file  . Attends Archivist Meetings: Not on file  . Marital Status: Not on file  Intimate Partner Violence:   . Fear of Current or Ex-Partner: Not on file  . Emotionally Abused: Not on file  . Physically Abused: Not on file  . Sexually Abused: Not on file   ROS  Review of Systems  Constitution: Negative for chills, decreased appetite, malaise/fatigue and weight gain.  Cardiovascular: Positive for leg swelling (mild at the end of the day bilateral ankles). Negative for dyspnea on exertion and syncope.  Endocrine: Negative for cold intolerance.  Hematologic/Lymphatic: Does not bruise/bleed easily.  Musculoskeletal: Positive for back pain. Negative for joint swelling.  Gastrointestinal: Negative for abdominal pain, anorexia, change in bowel habit, hematochezia and melena.  Neurological: Positive for disturbances in coordination, numbness and paresthesias. Negative for headaches and light-headedness.  Psychiatric/Behavioral: Negative for depression and substance abuse.  All other systems reviewed and are negative.  Objective  Height 6' (1.829 m), weight 193 lb (87.5 kg), SpO2 96 %.  Vitals with BMI 06/24/2019 06/24/2019 06/24/2019  Height '6\' 0"'  '6\' 0"'  '6\' 0"'   Weight 193 lbs 193 lbs 193 lbs  BMI 26.17 16.96 78.93  Systolic - - -  Diastolic - - -  Pulse - - -      06/24/19 12:32 PM  06/24/19 12:34 PM  06/24/19 12:35 PM    Orthostatic BP  124/63  136/67  132/63   BP Location  LeftArm  LeftArm  LeftArm   Patient Position  Supine  Sitting  Standing   Cuff Size  Normal  Normal  Normal   Orthostatic Pulse  52  55  59   SpO2  97%  97%  96%   Weight  193lb(87.5kg)  193lb(87.5kg)  193lb(87.5kg)   Height  6'(1.878m  6'(1.8281m 6'(1.82931m  Physical Exam  Constitutional: He  appears well-developed and well-nourished.  HENT:  Head: Atraumatic.  Eyes: Conjunctivae are normal.  Neck: No JVD present. No thyromegaly present.  Cardiovascular: Normal rate, regular rhythm, normal heart sounds and intact distal pulses. Exam reveals no gallop.  No murmur heard. No leg edema, no JVD.  Pulmonary/Chest: Effort normal and breath sounds normal.  Abdominal: Soft. Bowel sounds are normal.  Musculoskeletal:        General: Normal range of motion.     Cervical back: Neck supple.  Neurological: He is alert.  Skin: Skin is warm and dry.  Psychiatric: He has a normal mood and affect.  Laboratory examination:   No results for input(s): NA, K, CL, CO2, GLUCOSE, BUN, CREATININE, CALCIUM, GFRNONAA, GFRAA in the last 8760 hours. CrCl cannot be calculated (Patient's most recent lab result is older than the maximum 21 days allowed.).  CMP Latest Ref Rng & Units 09/16/2017 09/23/2016  Glucose 65 - 99 mg/dL 121(H) 115(H)  BUN 6 - 20 mg/dL 13 9  Creatinine 0.61 - 1.24 mg/dL 0.84 0.84  Sodium 135 - 145 mmol/L 137 139  Potassium 3.5 - 5.1 mmol/L 4.2 4.0  Chloride 101 - 111 mmol/L 104 104  CO2 22 - 32 mmol/L 21(L) 24  Calcium 8.9 - 10.3 mg/dL 9.0 8.9   CBC Latest Ref Rng & Units 09/16/2017 09/23/2016  WBC 4.0 - 10.5 K/uL 5.0 4.4  Hemoglobin 13.0 - 17.0 g/dL 13.7 12.8(L)  Hematocrit 39.0 - 52.0 % 40.8 38.8(L)  Platelets 150 - 400 K/uL 156 146(L)   Lipid Panel  No results found for: CHOL, TRIG, HDL, CHOLHDL, VLDL, LDLCALC, LDLDIRECT HEMOGLOBIN A1C No results found for: HGBA1C, MPG TSH No results for input(s): TSH in the last 8760 hours. Medications and allergies   Allergies  Allergen Reactions  . Atorvastatin Other (See Comments)    Joint pain  . Rosuvastatin Other (See Comments)    Joint pain  . Statins Other (See Comments)    Leg pain   . Folic Acid-Vit I7-POE U23 Anxiety, Itching and Nausea Only  . Hydrocodone Rash  . Pravastatin Nausea Only     Current Outpatient  Medications  Medication Instructions  . acetaminophen (TYLENOL) 650 mg, Oral, As needed  . Alirocumab (PRALUENT) 75 mg, Subcutaneous, Every 14 days  . amLODipine (NORVASC) 10 mg, Oral, Daily  . aspirin EC 81 mg, Oral, Daily  . azelastine (ASTELIN) 0.1 % nasal spray 1 spray, Each Nare, 2 times daily, Use in each nostril as directed  . azelastine (OPTIVAR) 0.05 % ophthalmic solution 1 drop 2 (two) times daily  . calcium carbonate (TUMS - DOSED IN MG ELEMENTAL CALCIUM) 500 MG chewable tablet 2 tablets, Oral, Daily PRN  . cetirizine (ZYRTEC) 10 mg, Oral, Daily  . cholecalciferol (VITAMIN D) 1,000 Units, Oral, Daily  . Cyanocobalamin (B-12) 1000 MCG/ML KIT 1 mL, Injection, Every 30 days  . cycloSPORINE (RESTASIS) 0.05 % ophthalmic emulsion 1 drop, Both Eyes, 2 times daily  . Infant Care Products (BABY SHAMPOO EX) Rub into a wash cloth and rub into the eyes 3 to 4 times daily for dry eyes / eye build up  . latanoprost (XALATAN) 0.005 % ophthalmic solution 1 drop, Both Eyes, Daily at bedtime  . lisinopril (ZESTRIL) 20 mg, Oral, 2 times daily  . metoprolol tartrate (LOPRESSOR) 12.5 mg, Oral, 2 times daily  . nitroGLYCERIN (NITROSTAT) 0.4 mg, Sublingual, Every 5 min PRN  . Olopatadine HCl 0.2 % SOLN 1 drop, Both Eyes, Daily  . pantoprazole (PROTONIX) 40 mg, Oral, BH-each morning  . Polyethyl Glycol-Propyl Glycol (SYSTANE OP) 1 drop, Ophthalmic, 5 times daily PRN  . sucralfate (CARAFATE) 1 g, Oral, 2 times daily  . zolpidem (AMBIEN) 10 mg, Oral, Daily at bedtime    Radiology:  No results found. Cardiac Studies:   Coronary Angiogram   [2015-08-18]: ( repeat cath 07/29/2016:) Prox LAD: 100% stenosis, LIMA to LAD graft patent, Mid RCA: 20% stenosis; SVG to D2: 100% stenosis in the middle third of the graft, s/p stenting with 3.5 x 33 Promus premier DES. Stent in the proximal graft, 3.5x33 mm Xience DES placed March 2014 widely patent.  Diffuse native vessel disease. Codominant system. Normal LVEF.     Echocardiogram 02/06/2016: Left ventricle cavity is normal in size. Mild concentric hypertrophy of the left ventricle.  Mild basal septal hypertrophy. Normal global wall motion. Normal diastolic filling pattern. Calculated EF 69%. Left atrial cavity is moderately dilated at 4.7 cm. Mild (Grade I) aortic regurgitation. Mild to moderate mitral regurgitation. Mild to moderate tricuspid regurgitation. Borderline elevation in PA pressure at 30 mm Hg and suggests mild pulmonary hypertension. IVC is dilated with respiratory variation. This may suggests elevated right heart pressure.  Assessment     ICD-10-CM   1. Coronary artery disease involving native coronary artery of native heart without angina pectoris  I25.10 EKG 12-Lead  2. Hx of CABG 2010: LIMA to LAD; SVG to D2  Z95.1   3. Other polyneuropathy  G62.89   4. Hypercholesteremia  E78.00     EKG 06/24/2019: Normal sinus rhythm/sinus bradycardia at the rate of 54 bpm with first-degree AV block, normal axis, inferior infarct old.  Low-voltage complexes.  No evidence of ischemia.    Recommendations:  No orders of the defined types were placed in this encounter.   [name] [name]  is a 83 y.o. Caucasian male with known CAD, hyperlipidemia, hypertension and esophageal stricture S/P dilatation remotely, multiple coronary interventions and eventually underwent CABG 2 in 2010. For NSTEMI, he has had SVG to D2 stent in 2010 and again in January 2017, patent stents by angiography in January 2018.  He has severe peripheral neuropathy, chronic back pain and statin intolerance. I had last seen him 2 years ago and he is re-establishing with me.  He remains angina free, no clinical evidence heart failure, EKG is unchanged.  I did not make any changes to his medication I will see him back in 3 months or sooner if problems, he is presently on Praluent for hyperlipidemia, states that his lipids are very well controlled and that he may need assistance for the  next year due to changes in the insurance. He was negative for orthostasis.   Adrian Prows, MD, [facility] 06/26/2019, 10:39 AM [facility] [place] Pager: [phone] Office: [phone] If no answer Cell [phone]

## 2019-06-28 ENCOUNTER — Encounter: Payer: Medicare HMO | Admitting: Physical Therapy

## 2019-06-30 ENCOUNTER — Encounter: Payer: Medicare HMO | Admitting: Physical Therapy

## 2019-07-03 ENCOUNTER — Other Ambulatory Visit: Payer: Self-pay

## 2019-07-03 ENCOUNTER — Emergency Department
Admission: EM | Admit: 2019-07-03 | Discharge: 2019-07-04 | Disposition: A | Discharge status: Home / Self Care | Payer: Medicare HMO | Attending: Emergency Medicine | Admitting: Emergency Medicine

## 2019-07-03 ENCOUNTER — Emergency Department: Payer: Medicare HMO

## 2019-07-03 ENCOUNTER — Encounter: Payer: Self-pay | Admitting: Emergency Medicine

## 2019-07-03 DIAGNOSIS — R197 Diarrhea, unspecified: Secondary | ICD-10-CM | POA: Insufficient documentation

## 2019-07-03 DIAGNOSIS — Z79899 Other long term (current) drug therapy: Secondary | ICD-10-CM | POA: Insufficient documentation

## 2019-07-03 DIAGNOSIS — R110 Nausea: Secondary | ICD-10-CM | POA: Insufficient documentation

## 2019-07-03 DIAGNOSIS — Z8546 Personal history of malignant neoplasm of prostate: Secondary | ICD-10-CM | POA: Diagnosis not present

## 2019-07-03 DIAGNOSIS — I252 Old myocardial infarction: Secondary | ICD-10-CM | POA: Diagnosis not present

## 2019-07-03 DIAGNOSIS — R1013 Epigastric pain: Secondary | ICD-10-CM | POA: Diagnosis present

## 2019-07-03 DIAGNOSIS — I10 Essential (primary) hypertension: Secondary | ICD-10-CM | POA: Diagnosis not present

## 2019-07-03 MED ORDER — LACTATED RINGERS IV BOLUS
1000.0000 mL | Freq: Once | INTRAVENOUS | Status: AC
  Administered 2019-07-04: 1000 mL via INTRAVENOUS

## 2019-07-03 MED ORDER — ONDANSETRON HCL 4 MG/2ML IJ SOLN
4.0000 mg | Freq: Once | INTRAMUSCULAR | Status: AC
  Administered 2019-07-04: 4 mg via INTRAVENOUS
  Filled 2019-07-03: qty 2

## 2019-07-03 NOTE — ED Triage Notes (Signed)
Patient presents to Emergency Department via [facility] from home (pt live alone) with complaints of epigastric pain.  Pt reports N without V after eating and 1 glass of wine, 3-4 diarrhea episodes.   Pt took 2 x SL nitro at home - unsure of any relief     History of hiatal hernia, CABG and 11 cardiac  catheterization

## 2019-07-03 NOTE — ED Provider Notes (Signed)
[facility] Emergency Department Provider Note   ____________________________________________   First MD Initiated Contact with Patient 07/03/19 2348     (approximate)  I have reviewed the triage vital signs and the nursing notes.   HISTORY  Chief Complaint Nausea and Diarrhea    HPI [name] [name] is a 83 y.o. male with past medical history of CAD, GERD, hypertension, who presents to the ED complaining of epigastric pain.  Patient reports that shortly after eating he began to feel nauseous with some epigastric discomfort as well as 3-4 episodes of diarrhea.  He denies any associated chest pain, but states that when he had cardiac issues in the past he only ever had epigastric discomfort.  For this reason, he decided to take to take 2 sublingual nitro but did not feel any different.  He has otherwise felt relatively recently with no fevers, cough, chest pain, or shortness of breath.        Past Medical History:  Diagnosis Date  . Arthritis   . Cancer [facility]) 1995   prostate ca   . Coronary artery disease   . Esophageal stricture 08/2016  . GERD (gastroesophageal reflux disease)   . Glaucoma   . History of hiatal hernia   . Hypertension   . MI (mitral incompetence)   . Peripheral neuropathy   . Peripheral vascular disease ([place])   . Vertigo     Patient Active Problem List   Diagnosis Date Noted  . S/P shoulder replacement, left 09/25/2017    Past Surgical History:  Procedure Laterality Date  . COLONOSCOPY    . CORONARY ANGIOPLASTY WITH STENT PLACEMENT  2005   x9   . CORONARY ARTERY BYPASS GRAFT  2011  . CORONARY STENT PLACEMENT  07/2014  . ESOPHAGOGASTRODUODENOSCOPY    . ESOPHAGOGASTRODUODENOSCOPY (EGD) WITH ESOPHAGEAL DILATION  2018  . knee scope Bilateral   . LEG SURGERY Right 09/2018  . LIPOMA EXCISION N/A 09/24/2016   Procedure: EXCISION SUBCUTANEOUS LIPOMA ON UPPER BACK;  Surgeon: Donnie Mesa, MD;  Location: [place];  Service:  General;  Laterality: N/A;  . Prostectomy  1995  . REVERSE SHOULDER ARTHROPLASTY Left 09/25/2017  . REVERSE SHOULDER ARTHROPLASTY Left 09/25/2017   Procedure: LEFT REVERSE SHOULDER ARTHROPLASTY; deltoid repair;  Surgeon: Netta Cedars, MD;  Location: [place];  Service: Orthopedics;  Laterality: Left;  . ROTATOR CUFF REPAIR Left 2001    Prior to Admission medications   Medication Sig Start Date End Date Taking? Authorizing Provider  acetaminophen (TYLENOL) 650 MG CR tablet Take 650 mg by mouth as needed for pain.     [provider]  Alirocumab (PRALUENT) 75 MG/ML SOSY Inject 75 mg into the skin every 14 (fourteen) days.     [provider]  amLODipine (NORVASC) 10 MG tablet Take 10 mg by mouth daily.     [provider]  aspirin EC 81 MG tablet Take 81 mg by mouth daily.    [provider]  azelastine (ASTELIN) 0.1 % nasal spray Place 1 spray into both nostrils 2 (two) times daily. Use in each nostril as directed    [provider]  azelastine (OPTIVAR) 0.05 % ophthalmic solution 1 drop 2 (two) times daily 03/31/18   [provider]  calcium carbonate (TUMS - DOSED IN MG ELEMENTAL CALCIUM) 500 MG chewable tablet Chew 2 tablets by mouth daily as needed for indigestion or heartburn.    [provider]  cetirizine (ZYRTEC) 10 MG tablet Take 10  mg by mouth daily.    [provider]  cholecalciferol (VITAMIN D) 1000 units tablet Take 1,000 Units by mouth daily.    [provider]  Cyanocobalamin (B-12) 1000 MCG/ML KIT Inject 1 mL as directed every 30 (thirty) days.    [provider]  cycloSPORINE (RESTASIS) 0.05 % ophthalmic emulsion Place 1 drop into both eyes 2 (two) times daily.    [provider]  Infant Care Products (BABY SHAMPOO EX) Rub into a wash cloth and rub into the eyes 3 to 4 times daily for dry eyes / eye build up    [provider]  latanoprost (XALATAN) 0.005 % ophthalmic  solution Place 1 drop into both eyes at bedtime. 08/22/17   [provider]  lisinopril (PRINIVIL,ZESTRIL) 20 MG tablet Take 20 mg by mouth 2 (two) times daily.     [provider]  metoprolol tartrate (LOPRESSOR) 25 MG tablet Take 12.5 mg by mouth 2 (two) times daily.     [provider]  nitroGLYCERIN (NITROSTAT) 0.4 MG SL tablet Place 0.4 mg under the tongue every 5 (five) minutes as needed for chest pain.    [provider]  Olopatadine HCl 0.2 % SOLN Place 1 drop into both eyes daily.    [provider]  pantoprazole (PROTONIX) 40 MG tablet Take 40 mg by mouth every morning.     [provider]  Polyethyl Glycol-Propyl Glycol (SYSTANE OP) Apply 1 drop to eye 5 (five) times daily as needed (dry eyes).    [provider]  sucralfate (CARAFATE) 1 g tablet Take 1 g by mouth 2 (two) times daily.     [provider]  zolpidem (AMBIEN) 10 MG tablet Take 10 mg by mouth at bedtime.     [provider]    Allergies Atorvastatin, Rosuvastatin, Statins, Folic acid-vit G3-OVF I43, Hydrocodone, and Pravastatin  Family History  Problem Relation Age of Onset  . Alzheimer's disease Mother   . Stroke Father   . Parkinson's disease Sister   . Cancer Sister     Social History Social History   Tobacco Use  . Smoking status: Never Smoker  . Smokeless tobacco: Never Used  Substance Use Topics  . Alcohol use: Yes    Alcohol/week: 4.0 standard drinks    Types: 4 Glasses of wine per week    Comment: wine with dinner  . Drug use: No    Review of Systems  Constitutional: No fever/chills Eyes: No visual changes. ENT: No sore throat. Cardiovascular: Denies chest pain. Respiratory: Denies shortness of breath. Gastrointestinal: For abdominal pain.  Positive for nausea, no vomiting.  Positive for diarrhea.  No constipation. Genitourinary: Negative for dysuria. Musculoskeletal: Negative for back pain. Skin: Negative for  rash. Neurological: Negative for headaches, focal weakness or numbness.  ____________________________________________   PHYSICAL EXAM:  VITAL SIGNS: ED Triage Vitals [07/03/19 2344]  Enc Vitals Group     BP      Pulse      Resp      Temp      Temp src      SpO2 97 %     Weight      Height      Head Circumference      Peak Flow      Pain Score      Pain Loc      Pain Edu?      Excl. in [place]?     Constitutional: Alert and oriented. Eyes:  Conjunctivae are normal. Head: Atraumatic. Nose: No congestion/rhinnorhea. Mouth/Throat: Mucous membranes are moist. Neck: Normal ROM Cardiovascular: Normal rate, regular rhythm. Grossly normal heart sounds. Respiratory: Normal respiratory effort.  No retractions. Lungs CTAB. Gastrointestinal: Soft and nontender. No distention. Genitourinary: deferred Musculoskeletal: No lower extremity tenderness nor edema. Neurologic:  Normal speech and language. No gross focal neurologic deficits are appreciated. Skin:  Skin is warm, dry and intact. No rash noted. Psychiatric: Mood and affect are normal. Speech and behavior are normal.  ____________________________________________   LABS (all labs ordered are listed, but only abnormal results are displayed)  Labs Reviewed  COMPREHENSIVE METABOLIC PANEL - Abnormal; Notable for the following components:      Result Value   Glucose, Bld 101 (*)    AST 12 (*)    All other components within normal limits  CBC WITH DIFFERENTIAL/PLATELET - Abnormal; Notable for the following components:   Hemoglobin 12.7 (*)    HCT 38.3 (*)    All other components within normal limits  LIPASE, BLOOD  TROPONIN I (HIGH SENSITIVITY)  TROPONIN I (HIGH SENSITIVITY)   ____________________________________________  EKG  ED ECG REPORT I, Blake Divine, the attending physician, personally viewed and interpreted this ECG.   Date: 07/04/2019  EKG Time: 23:46  Rate: 59  Rhythm: normal sinus rhythm  Axis: Normal   Intervals:none  ST&T Change: Significant artifact, possible ST elevation inferiorly  ED ECG REPORT I, Blake Divine, the attending physician, personally viewed and interpreted this ECG.   Date: 07/04/2019  EKG Time: 23:50  Rate: 56  Rhythm: normal sinus rhythm  Axis: Normal  Intervals:none  ST&T Change: None   PROCEDURES  Procedure(s) performed (including Critical Care):  Procedures   ____________________________________________   INITIAL IMPRESSION / ASSESSMENT AND PLAN / ED COURSE       83 year old male with history of CAD and GERD presents to the ED with acute onset of epigastric pain earlier this evening after eating, which was associated with nausea and diarrhea.  He continues to have some discomfort upon arrival but has no focal abdominal tenderness.  EKG initially had significant artifact due to patient movement and there was some question of ST elevation, however on repeat ECG with less artifact this is completely resolved with no acute ischemic changes.  His symptoms do sound very atypical for ACS given association with eating and noted diarrhea.  I would suspect GERD and given lack of abdominal tenderness, no indication for CT imaging.  Lab work is unremarkable, patient with 2 sets of troponin that are within normal limits.  Chest x-ray negative for acute process.  Patient reports feeling much better after dose of Phenergan, nausea and pain have resolved.  He is appropriate for discharge home and I have counseled him to follow-up with his PCP as well as his cardiologist.  I have counseled patient to return to the ED for new or worsening symptoms, patient agrees with plan.      ____________________________________________   FINAL CLINICAL IMPRESSION(S) / ED DIAGNOSES  Final diagnoses:  Epigastric pain  Nausea  Diarrhea, unspecified type     ED Discharge Orders    None       Note:  This document was prepared using Dragon voice recognition software and  may include unintentional dictation errors.   Blake Divine, MD 07/04/19 [phone]

## 2019-07-04 LAB — COMPREHENSIVE METABOLIC PANEL
ALT: 15 U/L (ref 0–44)
AST: 12 U/L — ABNORMAL LOW (ref 15–41)
Albumin: 3.9 g/dL (ref 3.5–5.0)
Alkaline Phosphatase: 44 U/L (ref 38–126)
Anion gap: 8 (ref 5–15)
BUN: 14 mg/dL (ref 8–23)
CO2: 28 mmol/L (ref 22–32)
Calcium: 8.9 mg/dL (ref 8.9–10.3)
Chloride: 101 mmol/L (ref 98–111)
Creatinine, Ser: 0.9 mg/dL (ref 0.61–1.24)
GFR calc Af Amer: >60 mL/min (ref >60)
GFR calc non Af Amer: >60 mL/min (ref >60)
Glucose, Bld: 101 mg/dL — ABNORMAL HIGH (ref 70–99)
Potassium: 4.1 mmol/L (ref 3.5–5.1)
Sodium: 137 mmol/L (ref 135–145)
Total Bilirubin: 0.7 mg/dL (ref 0.3–1.2)
Total Protein: 6.6 g/dL (ref 6.5–8.1)

## 2019-07-04 LAB — CBC WITH DIFFERENTIAL/PLATELET
Abs Immature Granulocytes: 0.02 10*3/uL (ref 0.00–0.07)
Basophils Absolute: 0 10*3/uL (ref 0.0–0.1)
Basophils Relative: 1 %
Eosinophils Absolute: 0.1 10*3/uL (ref 0.0–0.5)
Eosinophils Relative: 3 %
HCT: 38.3 % — ABNORMAL LOW (ref 39.0–52.0)
Hemoglobin: 12.7 g/dL — ABNORMAL LOW (ref 13.0–17.0)
Immature Granulocytes: 1 %
Lymphocytes Relative: 34 %
Lymphs Abs: 1.5 10*3/uL (ref 0.7–4.0)
MCH: 29.7 pg (ref 26.0–34.0)
MCHC: 33.2 g/dL (ref 30.0–36.0)
MCV: 89.5 fL (ref 80.0–100.0)
Monocytes Absolute: 0.4 10*3/uL (ref 0.1–1.0)
Monocytes Relative: 9 %
Neutro Abs: 2.3 10*3/uL (ref 1.7–7.7)
Neutrophils Relative %: 52 %
Platelets: 166 10*3/uL (ref 150–400)
RBC: 4.28 MIL/uL (ref 4.22–5.81)
RDW: 12.4 % (ref 11.5–15.5)
WBC: 4.3 10*3/uL (ref 4.0–10.5)
nRBC: 0 % (ref 0.0–0.2)

## 2019-07-04 LAB — TROPONIN I (HIGH SENSITIVITY)
Troponin I (High Sensitivity): 3 ng/L (ref <18)
Troponin I (High Sensitivity): 7 ng/L (ref <18)

## 2019-07-04 LAB — LIPASE, BLOOD: Lipase: 31 U/L (ref 11–51)

## 2019-07-04 MED ORDER — PROMETHAZINE HCL 25 MG/ML IJ SOLN
12.5000 mg | Freq: Once | INTRAMUSCULAR | Status: AC
  Administered 2019-07-04: 12.5 mg via INTRAVENOUS
  Filled 2019-07-04: qty 1

## 2019-07-04 NOTE — ED Notes (Signed)
Peripheral IV discontinued. Catheter intact. No signs of infiltration or redness. Gauze applied to IV site.   Discharge instructions reviewed with patient. Questions fielded by this RN. Patient verbalizes understanding of instructions. Patient discharged home in stable condition per jessup. No acute distress noted at time of discharge.    Pt wheeled to lobby to await family ride

## 2019-07-04 NOTE — ED Notes (Signed)
Pt assisted with urinal, warm blanket provided. Pt spilled urine on floor. Floor cleaned.

## 2019-07-04 NOTE — ED Notes (Signed)
Pt reports epigastric pain increased to 7 and nausea increasing, EDP notified and orders received

## 2019-07-04 NOTE — ED Notes (Signed)
Pt reports epigastric discomfort fells worse after Zofran

## 2019-07-04 NOTE — ED Notes (Addendum)
Call bell light answered, pt disconnected to use urinal at bedside   Pt denies nausea

## 2019-07-04 NOTE — ED Notes (Signed)
Pt reports taking HR at 47 and after taking nitro HR jumped to 54

## 2019-07-05 ENCOUNTER — Encounter: Payer: Medicare HMO | Admitting: Physical Therapy

## 2019-07-06 ENCOUNTER — Ambulatory Visit: Payer: BLUE CROSS/BLUE SHIELD | Admitting: Cardiology

## 2019-07-06 ENCOUNTER — Telehealth: Payer: Self-pay

## 2019-07-06 NOTE — Telephone Encounter (Signed)
The patient called and asked if you could look at his recent ER visit.

## 2019-07-10 NOTE — Telephone Encounter (Signed)
Looks like his pain was probably not cardiac. All the tests looked okay including EKG

## 2019-07-12 ENCOUNTER — Encounter: Payer: Medicare HMO | Admitting: Physical Therapy

## 2019-07-14 ENCOUNTER — Encounter: Payer: Medicare HMO | Admitting: Physical Therapy

## 2019-07-19 ENCOUNTER — Encounter: Payer: Medicare HMO | Admitting: Physical Therapy

## 2019-07-21 ENCOUNTER — Encounter: Payer: Medicare HMO | Admitting: Physical Therapy

## 2019-07-24 ENCOUNTER — Ambulatory Visit
Admission: EM | Admit: 2019-07-24 | Discharge: 2019-07-24 | Disposition: A | Discharge status: Home / Self Care | Payer: Medicare HMO | Attending: Family Medicine | Admitting: Family Medicine

## 2019-07-24 ENCOUNTER — Encounter: Payer: Self-pay | Admitting: Emergency Medicine

## 2019-07-24 ENCOUNTER — Ambulatory Visit (INDEPENDENT_AMBULATORY_CARE_PROVIDER_SITE_OTHER): Payer: Medicare HMO

## 2019-07-24 ENCOUNTER — Other Ambulatory Visit: Payer: Self-pay

## 2019-07-24 DIAGNOSIS — M25551 Pain in right hip: Secondary | ICD-10-CM

## 2019-07-24 DIAGNOSIS — M5441 Lumbago with sciatica, right side: Secondary | ICD-10-CM

## 2019-07-24 DIAGNOSIS — W19XXXA Unspecified fall, initial encounter: Secondary | ICD-10-CM | POA: Diagnosis not present

## 2019-07-24 MED ORDER — PREDNISONE 20 MG PO TABS: 40.0000 mg | ORAL_TABLET | Freq: Every day | ORAL | 0 refills | Status: DC

## 2019-07-24 NOTE — Discharge Instructions (Addendum)
Take medication as prescribed. Stretch. Ice.Heat.   Follow up with your primary care physician this week as scheduled. Return to Urgent care for new or worsening concerns.

## 2019-07-24 NOTE — ED Provider Notes (Signed)
[facility] ____________________________________________  Time seen: Approximately 12:15 PM  I have reviewed the triage vital signs and the nursing notes.   HISTORY  Chief Complaint Hip Pain (right)   HPI [name] [name] is a 84 y.o. male presenting for evaluation of right hip pain.  Patient reports 1 month ago he tripped and fell hitting his head after leaving his cane in the car.  States he was seen at that time and had CT of head and face that were negative and the symptoms have resolved.  Patient reports coming in today as he reports shortly after that visit he started noticing right hip pain.  Reports he believes this is due to that fall.  Denies any other recent fall.  States has continued remain ambulatory.  States the pain is mostly in right posterior hip and right hip and describes it as a catching pain particularly when he goes from sitting to standing.  States when he is walking and walking with his dog he does not have any issues.  States the pain occasionally goes down to his right thigh and described as a burning pain.  No atypical paresthesias, does report he has chronic peripheral neuropathy in lower extremities.  Denies any urinary or bowel retention or incontinence.  Denies abdominal pain, midline back pain, chest pain, shortness of breath, penile or testicular pain.  No rash.  Has continued to remain active.  Does take Tylenol in the morning and in the evening but has not been taking any breakthrough Tylenol.  [name], FNP : PCP   Past Medical History:  Diagnosis Date  . Arthritis   . Cancer [facility]) 1995   prostate ca   . Coronary artery disease   . Esophageal stricture 08/2016  . GERD (gastroesophageal reflux disease)   . Glaucoma   . History of hiatal hernia   . Hypertension   . MI (mitral incompetence)   . Peripheral neuropathy   . Peripheral vascular disease ([place])   . Vertigo     Patient Active Problem List   Diagnosis Date Noted  .  S/P shoulder replacement, left 09/25/2017    Past Surgical History:  Procedure Laterality Date  . COLONOSCOPY    . CORONARY ANGIOPLASTY WITH STENT PLACEMENT  2005   x9   . CORONARY ARTERY BYPASS GRAFT  2011  . CORONARY STENT PLACEMENT  07/2014  . ESOPHAGOGASTRODUODENOSCOPY    . ESOPHAGOGASTRODUODENOSCOPY (EGD) WITH ESOPHAGEAL DILATION  2018  . knee scope Bilateral   . LEG SURGERY Right 09/2018  . LIPOMA EXCISION N/A 09/24/2016   Procedure: EXCISION SUBCUTANEOUS LIPOMA ON UPPER BACK;  Surgeon: Donnie Mesa, MD;  Location: [place];  Service: General;  Laterality: N/A;  . Prostectomy  1995  . REVERSE SHOULDER ARTHROPLASTY Left 09/25/2017  . REVERSE SHOULDER ARTHROPLASTY Left 09/25/2017   Procedure: LEFT REVERSE SHOULDER ARTHROPLASTY; deltoid repair;  Surgeon: Netta Cedars, MD;  Location: [place];  Service: Orthopedics;  Laterality: Left;  . ROTATOR CUFF REPAIR Left 2001     No current facility-administered medications for this encounter.  Current Outpatient Medications:  .  acetaminophen (TYLENOL) 650 MG CR tablet, Take 650 mg by mouth as needed for pain. , Disp: , Rfl:  .  Alirocumab (PRALUENT) 75 MG/ML SOSY, Inject 75 mg into the skin every 14 (fourteen) days. , Disp: , Rfl:  .  amLODipine (NORVASC) 10 MG tablet, Take 10 mg by mouth daily. , Disp: , Rfl:  .  aspirin EC 81 MG tablet, Take  81 mg by mouth daily., Disp: , Rfl:  .  azelastine (ASTELIN) 0.1 % nasal spray, Place 1 spray into both nostrils 2 (two) times daily. Use in each nostril as directed, Disp: , Rfl:  .  calcium carbonate (TUMS - DOSED IN MG ELEMENTAL CALCIUM) 500 MG chewable tablet, Chew 2 tablets by mouth daily as needed for indigestion or heartburn., Disp: , Rfl:  .  cetirizine (ZYRTEC) 10 MG tablet, Take 10 mg by mouth daily., Disp: , Rfl:  .  cholecalciferol (VITAMIN D) 1000 units tablet, Take 1,000 Units by mouth daily., Disp: , Rfl:  .  Cyanocobalamin (B-12) 1000 MCG/ML KIT, Inject 1 mL as directed every 30 (thirty)  days., Disp: , Rfl:  .  Infant Care Products (BABY SHAMPOO EX), Rub into a wash cloth and rub into the eyes 3 to 4 times daily for dry eyes / eye build up, Disp: , Rfl:  .  latanoprost (XALATAN) 0.005 % ophthalmic solution, Place 1 drop into both eyes at bedtime., Disp: , Rfl: 0 .  lisinopril (PRINIVIL,ZESTRIL) 20 MG tablet, Take 20 mg by mouth 2 (two) times daily. , Disp: , Rfl:  .  metoprolol tartrate (LOPRESSOR) 25 MG tablet, Take 12.5 mg by mouth 2 (two) times daily. , Disp: , Rfl:  .  nitroGLYCERIN (NITROSTAT) 0.4 MG SL tablet, Place 0.4 mg under the tongue every 5 (five) minutes as needed for chest pain., Disp: , Rfl:  .  Olopatadine HCl 0.2 % SOLN, Place 1 drop into both eyes daily., Disp: , Rfl:  .  pantoprazole (PROTONIX) 40 MG tablet, Take 40 mg by mouth every morning. , Disp: , Rfl:  .  Polyethyl Glycol-Propyl Glycol (SYSTANE OP), Apply 1 drop to eye 5 (five) times daily as needed (dry eyes)., Disp: , Rfl:  .  sucralfate (CARAFATE) 1 g tablet, Take 1 g by mouth 2 (two) times daily. , Disp: , Rfl:  .  zolpidem (AMBIEN) 10 MG tablet, Take 10 mg by mouth at bedtime. , Disp: , Rfl:  .  azelastine (OPTIVAR) 0.05 % ophthalmic solution, 1 drop 2 (two) times daily, Disp: , Rfl:  .  cycloSPORINE (RESTASIS) 0.05 % ophthalmic emulsion, Place 1 drop into both eyes 2 (two) times daily., Disp: , Rfl:  .  predniSONE (DELTASONE) 20 MG tablet, Take 2 tablets (40 mg total) by mouth daily., Disp: 10 tablet, Rfl: 0  Allergies Atorvastatin, Rosuvastatin, Statins, Folic acid-vit F7-CBS W96, Hydrocodone, and Pravastatin  Family History  Problem Relation Age of Onset  . Alzheimer's disease Mother   . Stroke Father   . Parkinson's disease Sister   . Cancer Sister     Social History Social History   Tobacco Use  . Smoking status: Never Smoker  . Smokeless tobacco: Never Used  Substance Use Topics  . Alcohol use: Yes    Alcohol/week: 4.0 standard drinks    Types: 4 Glasses of wine per week     Comment: wine with dinner  . Drug use: No    Review of Systems Constitutional: No fever ENT: No sore throat. Cardiovascular: Denies chest pain. Respiratory: Denies shortness of breath. Gastrointestinal: No abdominal pain.  No nausea, no vomiting.  No diarrhea.  No constipation. Genitourinary: Negative for dysuria. Musculoskeletal: Positive right hip pain. Skin: Negative for rash. Neurological: Negative for focal weakness or numbness.   ____________________________________________   PHYSICAL EXAM:  VITAL SIGNS: ED Triage Vitals  Enc Vitals Group     BP 07/24/19 1134 139/69  Pulse Rate 07/24/19 1134 60     Resp 07/24/19 1134 18     Temp 07/24/19 1134 98 F (36.7 C)     Temp Source 07/24/19 1134 Oral     SpO2 07/24/19 1134 97 %     Weight 07/24/19 1129 195 lb (88.5 kg)     Height 07/24/19 1129 6' (1.829 m)     Head Circumference --      Peak Flow --      Pain Score 07/24/19 1127 5     Pain Loc --      Pain Edu? --      Excl. in [place]? --     Constitutional: Alert and oriented. Well appearing and in no acute distress. Eyes: Conjunctivae are normal. ENT      Head: Normocephalic and atraumatic. Cardiovascular: Normal rate, regular rhythm. Grossly normal heart sounds.  Good peripheral circulation. Respiratory: Normal respiratory effort without tachypnea nor retractions. Breath sounds are clear and equal bilaterally. No wheezes, rales, rhonchi. Gastrointestinal: Soft and nontender. Musculoskeletal: No midline cervical, thoracic or lumbar tenderness to palpation.  Mild tenderness to right greater sciatic notch.  Mild tenderness to right lateral hip greater trochanter.  Able to fully flex and extend right leg and able to flex, abduct and abduct right hip but with mild pain.  No saddle anesthesia.  Changes positions quickly.  Steady gait. Neurologic:  Normal speech and language. No gross focal neurologic deficits are appreciated. Speech is normal. No gait instability.  Skin:   Skin is warm, dry and intact. No rash noted. Psychiatric: Mood and affect are normal. Speech and behavior are normal. Patient exhibits appropriate insight and judgment   ___________________________________________   LABS (all labs ordered are listed, but only abnormal results are displayed)  Labs Reviewed - No data to display  RADIOLOGY  DG Hip Unilat W or Wo Pelvis 2-3 Views Right  Result Date: 07/24/2019 CLINICAL DATA:  Right hip and pelvic pain after fall 3 weeks prior. EXAM: DG HIP (WITH OR WITHOUT PELVIS) 2-3V RIGHT COMPARISON:  None. FINDINGS: No pelvic fracture or diastasis. No right hip fracture or dislocation. Mild osteoarthritis in the weight-bearing portions of both hips. No suspicious focal osseous lesions. Surgical clips overlie the prostatectomy bed. Degenerative changes in the visualized lower lumbar spine. IMPRESSION: No fracture. No right hip malalignment. Mild osteoarthritis in the weight-bearing portions of both hip joints. Electronically Signed   By: Ilona Sorrel M.D.   On: 07/24/2019 12:36   ____________________________________________   PROCEDURES Procedures    INITIAL IMPRESSION / ASSESSMENT AND PLAN / ED COURSE  Pertinent labs & imaging results that were available during my care of the patient were reviewed by me and considered in my medical decision making (see chart for details).  Well-appearing patient.  No acute distress.  Right hip pain.  No midline lumbar tenderness though does have right posterior hip tenderness and sciatic notch, suspect sciatica and osteoarthritis.  Will evaluate right hip and pelvis x-ray.  X-ray as above per radiologist, reviewed, no fracture, no hip malalignment, mild osteoarthritis in weightbearing portions of both hips.  Discussed findings with patient.  Will treat with 5-day course of prednisone.  Discussed stretch, ice, heat and supportive care.  Monitor.  Follow-up with primary as scheduled this week.Discussed indication, risks  and benefits of medications with patient.   Discussed follow up with Primary care physician this week. Discussed follow up and return parameters including no resolution or any worsening concerns. Patient verbalized understanding and agreed  to plan.   ____________________________________________   FINAL CLINICAL IMPRESSION(S) / ED DIAGNOSES  Final diagnoses:  Right hip pain  Acute right-sided low back pain with right-sided sciatica     ED Discharge Orders         Ordered    predniSONE (DELTASONE) 20 MG tablet  Daily     07/24/19 1240           Note: This dictation was prepared with Dragon dictation along with smaller phrase technology. Any transcriptional errors that result from this process are unintentional.         Marylene Land, NP 07/24/19 1704

## 2019-07-24 NOTE — ED Triage Notes (Signed)
Pt c/o right hip pain. He fell about 3 weeks ago. The hip has been hurting him since then. He did not mention the hip when he was here for his fall. He states that the hip feels like it 'catches" when he stands up. Feels better when he starts to walk, worse when he is actually standing up from a chair.

## 2019-07-26 ENCOUNTER — Encounter: Payer: Medicare HMO | Admitting: Physical Therapy

## 2019-07-28 ENCOUNTER — Encounter: Payer: Medicare HMO | Admitting: Physical Therapy

## 2019-08-11 ENCOUNTER — Ambulatory Visit: Payer: Medicare HMO | Admitting: Physical Therapy

## 2019-08-17 ENCOUNTER — Telehealth: Payer: Self-pay

## 2019-08-17 NOTE — Telephone Encounter (Signed)
He should definitely get this. Take tyelinol and also Aleve 400 mg 6 hours before and then twice daily for 3 days

## 2019-08-29 DIAGNOSIS — D649 Anemia, unspecified: Secondary | ICD-10-CM | POA: Insufficient documentation

## 2019-09-05 ENCOUNTER — Encounter: Payer: Self-pay | Admitting: Cardiology

## 2019-09-16 ENCOUNTER — Ambulatory Visit: Payer: Medicare HMO | Admitting: Urology

## 2019-09-22 ENCOUNTER — Encounter: Payer: Self-pay | Admitting: Cardiology

## 2019-09-22 ENCOUNTER — Ambulatory Visit: Payer: Medicare HMO | Admitting: Cardiology

## 2019-09-22 ENCOUNTER — Other Ambulatory Visit: Payer: Self-pay

## 2019-09-22 VITALS — BP 115/60 | HR 64 | Temp 98.4°F | Resp 16 | Ht 72.0 in | Wt 198.1 lb

## 2019-09-22 DIAGNOSIS — I2510 Atherosclerotic heart disease of native coronary artery without angina pectoris: Secondary | ICD-10-CM

## 2019-09-22 DIAGNOSIS — Z789 Other specified health status: Secondary | ICD-10-CM

## 2019-09-22 DIAGNOSIS — G6289 Other specified polyneuropathies: Secondary | ICD-10-CM

## 2019-09-22 DIAGNOSIS — E7800 Pure hypercholesterolemia, unspecified: Secondary | ICD-10-CM

## 2019-09-22 MED ORDER — PRALUENT 150 MG/ML ~~LOC~~ SOAJ: 1.0000 "pen " | SUBCUTANEOUS | 4 refills | Status: DC

## 2019-09-22 NOTE — Progress Notes (Signed)
Primary Physician/Referring:  Perrin Maltese, MD  Patient ID: [name] [name], male    DOB: Aug 25, 1935, 84 y.o.   MRN: [id number]  Chief Complaint  Patient presents with  . Coronary Artery Disease  . Hyperlipidemia  . Follow-up    3 months   HPI:    [name] [name]  is a 84 y.o. Caucasian male with known CAD, hyperlipidemia, hypertension and esophageal stricture S/P dilatation remotely, multiple coronary interventions and eventually underwent CABG 2 in 2010. For NSTEMI, he has had SVG to D2 stent in 2010 and again in January 2017, patent stents by angiography in January 2018.  He has had 2 coronary intervention to SVG to D2 with placement of the proximal stent in 2010 and mid segment stent in January 2017 when he presented with NSTEMI  in [place].    Denies any chest pain, PND, or orthopnea.  He is presently doing well and denies any new symptoms.  Tolerating all his medications well.  His main complaints are chronic back pain, severe spinal stenosis and bilateral loss of coordination and loss of sensation in the feet.  He has been trying to avoid surgery due to his cardiac issues.  He has severe peripheral neuropathy, complete loss of sensation in both feet, and severe statin intolerance with rhabdomyolysis history in the past.   Past Medical History:  Diagnosis Date  . Arthritis   . Cancer [facility]) 1995   prostate ca   . Coronary artery disease   . Esophageal stricture 08/2016  . GERD (gastroesophageal reflux disease)   . Glaucoma   . History of hiatal hernia   . Hypertension   . MI (mitral incompetence)   . Peripheral neuropathy   . Peripheral vascular disease ([place])   . Vertigo    Past Surgical History:  Procedure Laterality Date  . COLONOSCOPY    . CORONARY ANGIOPLASTY WITH STENT PLACEMENT  2005   x9   . CORONARY ARTERY BYPASS GRAFT  2011  . CORONARY STENT PLACEMENT  07/2014  . ESOPHAGOGASTRODUODENOSCOPY    . ESOPHAGOGASTRODUODENOSCOPY (EGD) WITH ESOPHAGEAL  DILATION  2018  . knee scope Bilateral   . LEG SURGERY Right 09/2018  . LIPOMA EXCISION N/A 09/24/2016   Procedure: EXCISION SUBCUTANEOUS LIPOMA ON UPPER BACK;  Surgeon: Donnie Mesa, MD;  Location: [place];  Service: General;  Laterality: N/A;  . Prostectomy  1995  . REVERSE SHOULDER ARTHROPLASTY Left 09/25/2017  . REVERSE SHOULDER ARTHROPLASTY Left 09/25/2017   Procedure: LEFT REVERSE SHOULDER ARTHROPLASTY; deltoid repair;  Surgeon: Netta Cedars, MD;  Location: [place];  Service: Orthopedics;  Laterality: Left;  . ROTATOR CUFF REPAIR Left 2001   Social History   Tobacco Use  . Smoking status: Never Smoker  . Smokeless tobacco: Never Used  Substance Use Topics  . Alcohol use: Yes    Alcohol/week: 4.0 standard drinks    Types: 4 Glasses of wine per week    Comment: wine with dinner    ROS  Review of Systems  Cardiovascular: Positive for leg swelling. Negative for chest pain and dyspnea on exertion.  Musculoskeletal: Positive for back pain.  Gastrointestinal: Negative for melena.  Neurological: Positive for disturbances in coordination, numbness and paresthesias.   Objective  Blood pressure 115/60, pulse 64, temperature 98.4 F (36.9 C), temperature source Temporal, resp. rate 16, height 6' (1.829 m), weight 198 lb 1.6 oz (89.9 kg), SpO2 100 %.  Vitals with BMI 09/22/2019 07/24/2019 07/04/2019  Height '6\' 0"'  '6\' 0"'  -  Weight 198 lbs 2 oz 195 lbs -  BMI 32.44 01.02 -  Systolic 725 366 440  Diastolic 60 69 67  Pulse 64 60 62    Orthostatic VS for the past 72 hrs (Last 3 readings):  Patient Position BP Location Cuff Size  09/22/19 1118 Sitting Left Arm Large    Physical Exam  Constitutional: He appears well-developed and well-nourished.  Cardiovascular: Normal rate, regular rhythm, normal heart sounds and intact distal pulses. Exam reveals no gallop.  No murmur heard. No leg edema, no JVD.  Pulmonary/Chest: Effort normal and breath sounds normal.  Abdominal: Soft. Bowel sounds  are normal.  Musculoskeletal:     Cervical back: Neck supple.   Laboratory examination:   Recent Labs    07/03/19 2345  NA 137  K 4.1  CL 101  CO2 28  GLUCOSE 101*  BUN 14  CREATININE 0.90  CALCIUM 8.9  GFRNONAA >60  GFRAA >60   CrCl cannot be calculated (Patient's most recent lab result is older than the maximum 21 days allowed.).  CMP Latest Ref Rng & Units 07/03/2019 09/16/2017 09/23/2016  Glucose 70 - 99 mg/dL 101(H) 121(H) 115(H)  BUN 8 - 23 mg/dL '14 13 9  ' Creatinine 0.61 - 1.24 mg/dL 0.90 0.84 0.84  Sodium 135 - 145 mmol/L 137 137 139  Potassium 3.5 - 5.1 mmol/L 4.1 4.2 4.0  Chloride 98 - 111 mmol/L 101 104 104  CO2 22 - 32 mmol/L 28 21(L) 24  Calcium 8.9 - 10.3 mg/dL 8.9 9.0 8.9  Total Protein 6.5 - 8.1 g/dL 6.6 - -  Total Bilirubin 0.3 - 1.2 mg/dL 0.7 - -  Alkaline Phos 38 - 126 U/L 44 - -  AST 15 - 41 U/L 12(L) - -  ALT 0 - 44 U/L 15 - -   CBC Latest Ref Rng & Units 07/03/2019 09/16/2017 09/23/2016  WBC 4.0 - 10.5 K/uL 4.3 5.0 4.4  Hemoglobin 13.0 - 17.0 g/dL 12.7(L) 13.7 12.8(L)  Hematocrit 39.0 - 52.0 % 38.3(L) 40.8 38.8(L)  Platelets 150 - 400 K/uL 166 156 146(L)   External labs  Labs 09/12/2019: Hb 13.4/15.6, platelets 178, normal indicis.  Serum glucose 96 mg, BMP normal N, EGFR >60 mL.  A1c 5.4%.  TSH normal.  Total cholesterol 164, triglycerides 171, HDL 47, LDL 88.  Vitamin D 36.4.  Basic Metabolic PanelResulted: 3/47/4259 5:15 PM [facility] Component Name Value Ref Range Sodium 134 (L) 135 - 146 MMOL/L Potassium 4.0  Comment: NO VISIBLE HEMOLYSIS 3.5 - 5.3 MMOL/L Chloride 103 98 - 110 MMOL/L CO2 27 23 - 30 MMOL/L BUN 19 8 - 24 MG/DL Glucose 88  Comment: Patients taking eltrombopag at doses >/= 100 mg daily may show falsely elevated values of 10% or greater. 70 - 99 MG/DL Creatinine 0.87 0.5 - 1.5 MG/DL Calcium 9.0 8.5 - 10.5 MG/DL Anion Gap 4 4 - 14 MMOL/L Est. GFR Non-African American 81  Comment: GFR estimated  by CKD-EPI equations, reportable up to 90 ML/MIN/1.73 M*2 >=60 ML/MIN/1.73 M*2   BNP 93.0 08/13/2019   Hb 12.8/HCT 37.3, platelets 144.  08/13/2019      Medications and allergies   Allergies  Allergen Reactions  . Atorvastatin Other (See Comments)    Joint pain  . Rosuvastatin Other (See Comments)    Joint pain  . Statins Other (See Comments)    Leg pain   . Folic Acid-Vit D6-LOV F64 Anxiety, Itching and Nausea Only  . Hydrocodone Rash  .  Pravastatin Nausea Only     Current Outpatient Medications  Medication Instructions  . acetaminophen (TYLENOL) 650 mg, Oral, As needed, 2 tablets in the morning and 2 tablets at bedtime  . Alirocumab (PRALUENT) 150 MG/ML SOAJ 1 pen, Subcutaneous, Every 14 days  . amLODipine (NORVASC) 10 mg, Oral, Daily  . aspirin EC 81 mg, Oral, Daily  . azelastine (ASTELIN) 0.1 % nasal spray 1 spray, Each Nare, 2 times daily, Use in each nostril as directed  . azelastine (OPTIVAR) 0.05 % ophthalmic solution 1 drop 2 (two) times daily  . calcium carbonate (TUMS - DOSED IN MG ELEMENTAL CALCIUM) 500 MG chewable tablet 2 tablets, Oral, Daily PRN  . cetirizine (ZYRTEC) 10 mg, Oral, Daily  . cholecalciferol (VITAMIN D) 1,000 Units, Oral, Daily  . Cyanocobalamin (B-12) 1000 MCG/ML KIT 1 mL, Injection, Every 30 days  . cycloSPORINE (RESTASIS) 0.05 % ophthalmic emulsion 1 drop, Both Eyes, 2 times daily  . Infant Care Products (BABY SHAMPOO EX) Rub into a wash cloth and rub into the eyes 3 to 4 times daily for dry eyes / eye build up  . latanoprost (XALATAN) 0.005 % ophthalmic solution 1 drop, Both Eyes, Daily at bedtime  . lisinopril (ZESTRIL) 20 mg, Oral, 2 times daily  . metoprolol tartrate (LOPRESSOR) 12.5 mg, Oral, 2 times daily  . nitroGLYCERIN (NITROSTAT) 0.4 mg, Sublingual, Every 5 min PRN  . Olopatadine HCl 0.2 % SOLN 1 drop, Both Eyes, Daily  . Pancrelipase, Lip-Prot-Amyl, (CREON PO) Oral, Prescribed samples from Dr. Benson Norway   . pantoprazole (PROTONIX) 40 mg,  Oral, BH-each morning  . Polyethyl Glycol-Propyl Glycol (SYSTANE OP) 1 drop, Ophthalmic, 5 times daily PRN  . sucralfate (CARAFATE) 1 g, Oral, 2 times daily  . zolpidem (AMBIEN) 10 mg, Oral, Daily at bedtime    Radiology:  No results found. Cardiac Studies:   Coronary Angiogram   [09-Aug-2015]: ( repeat cath 07/29/2016:) Prox LAD: 100% stenosis, LIMA to LAD graft patent, Mid RCA: 20% stenosis; SVG to D2: 100% stenosis in the middle third of the graft, s/p stenting with 3.5 x 33 Promus premier DES. Stent in the proximal graft, 3.5x33 mm Xience DES placed March 2014 widely patent. Diffuse native vessel disease. Codominant system. Normal LVEF.    Echocardiogram 02/06/2016: Left ventricle cavity is normal in size. Mild concentric hypertrophy of the left ventricle.  Mild basal septal hypertrophy. Normal global wall motion. Normal diastolic filling pattern. Calculated EF 69%. Left atrial cavity is moderately dilated at 4.7 cm. Mild (Grade I) aortic regurgitation. Mild to moderate mitral regurgitation. Mild to moderate tricuspid regurgitation. Borderline elevation in PA pressure at 30 mm Hg and suggests mild pulmonary hypertension. IVC is dilated with respiratory variation. This may suggests elevated right heart pressure.  EKG:  EKG 06/24/2019: Normal sinus rhythm/sinus bradycardia at the rate of 54 bpm with first-degree AV block, normal axis, inferior infarct old.  Low-voltage complexes.  No evidence of ischemia.    Assessment     ICD-10-CM   1. Coronary artery disease involving native coronary artery of native heart without angina pectoris  I25.10 Alirocumab (PRALUENT) 150 MG/ML SOAJ  2. Hypercholesteremia  E78.00 Alirocumab (PRALUENT) 150 MG/ML SOAJ  3. Other polyneuropathy  G62.89   4. Statin intolerance, Rhabdomyolysis  Z78.9     Recommendations:   Meds ordered this encounter  Medications  . Alirocumab (PRALUENT) 150 MG/ML SOAJ    Sig: Inject 1 pen into the skin every 14 (fourteen)  days.    Dispense:  6 pen  Refill:  4    [name] [name]  is a 84 y.o. Caucasian male with known CAD, hyperlipidemia, hypertension and esophageal stricture S/P dilatation remotely, multiple coronary interventions and eventually underwent CABG 2 in 2010. For NSTEMI, he has had SVG to D2 stent in 2010 and again in January 2017, patent stents by angiography in January 2018.  He has severe peripheral neuropathy, chronic back pain and statin intolerance. I had last seen him 2 years ago and he is re-establishing with me.  He remains angina free, no clinical evidence heart failure, EKG is unchanged.  I reviewed his external labs, lipids are not well controlled.  I want him for LDL goal closer to 50 if not at least <70 mg percent.  Advised him to increase Praluent, his PCP is already got it approved, I will send her this letter about increasing the dose to 150 mg.  I completely agree with her that if triglycerides are not well controlled after being on Praluent 154 mg, he would certainly qualify for being on Vascepa for cardiovascular protection.  With regard to peripheral neuropathy, I do not suspect peripheral arterial disease.  This is chronic.  He can try supplementation of B1 and B6 vitamins along with B12 that he is already on.  I will see him back in 6 months.  Adrian Prows, MD, [facility] 09/22/2019, 11:57 AM [facility]. [place] Office: [phone]

## 2019-09-27 ENCOUNTER — Other Ambulatory Visit: Payer: Self-pay

## 2019-09-27 MED ORDER — METOPROLOL TARTRATE 25 MG PO TABS: 12.5000 mg | ORAL_TABLET | Freq: Two times a day (BID) | ORAL | 3 refills | Status: DC

## 2019-09-27 MED ORDER — AMLODIPINE BESYLATE 10 MG PO TABS: 10.0000 mg | ORAL_TABLET | Freq: Every day | ORAL | 3 refills | Status: DC

## 2019-09-29 ENCOUNTER — Ambulatory Visit: Payer: Medicare HMO | Admitting: Urology

## 2019-10-05 ENCOUNTER — Telehealth: Payer: Self-pay | Admitting: Urology

## 2019-10-05 NOTE — Telephone Encounter (Signed)
Please call [name] [name] and have him reschedule his missed appointment for history of prostate cancer.   He was a referral from [facility], he can see any provider per Dr. Diamantina Providence.

## 2019-10-06 NOTE — Telephone Encounter (Signed)
Patient states he has already had an appointment with previous urologist and would not like to reschedule.

## 2019-10-28 DIAGNOSIS — K8681 Exocrine pancreatic insufficiency: Secondary | ICD-10-CM | POA: Insufficient documentation

## 2019-10-28 DIAGNOSIS — I739 Peripheral vascular disease, unspecified: Secondary | ICD-10-CM | POA: Insufficient documentation

## 2019-11-11 ENCOUNTER — Telehealth: Payer: Self-pay

## 2019-11-11 NOTE — Telephone Encounter (Signed)
PASS, patient assistance program enrollment started for Praluent. Patient will bring income verification with him to his 11/24/18 visit with Dr. Einar Gip.

## 2019-11-11 NOTE — Telephone Encounter (Signed)
-----   Message from Adrian Prows, MD sent at 11/11/2019 12:12 PM EDT ----- Regarding: rEPATHA Make sure PA is done for him  JG

## 2019-11-14 ENCOUNTER — Telehealth: Payer: Self-pay

## 2019-11-14 NOTE — Telephone Encounter (Signed)
Faxed patience assistance to P[employer] program.

## 2019-11-21 ENCOUNTER — Other Ambulatory Visit: Payer: Self-pay | Admitting: *Deleted

## 2019-11-21 DIAGNOSIS — I83893 Varicose veins of bilateral lower extremities with other complications: Secondary | ICD-10-CM

## 2019-11-23 ENCOUNTER — Ambulatory Visit (HOSPITAL_COMMUNITY)
Admission: RE | Admit: 2019-11-23 | Discharge: 2019-11-23 | Disposition: A | Discharge status: Home / Self Care | Payer: Medicare HMO | Source: Ambulatory Visit | Attending: Surgery | Admitting: Surgery

## 2019-11-23 ENCOUNTER — Ambulatory Visit: Payer: Medicare HMO | Admitting: Vascular Surgery

## 2019-11-23 ENCOUNTER — Encounter: Payer: Self-pay | Admitting: Vascular Surgery

## 2019-11-23 ENCOUNTER — Other Ambulatory Visit: Payer: Self-pay

## 2019-11-23 VITALS — BP 129/67 | HR 57 | Temp 98.1°F | Resp 18 | Ht 72.0 in | Wt 195.0 lb

## 2019-11-23 DIAGNOSIS — I83811 Varicose veins of right lower extremities with pain: Secondary | ICD-10-CM | POA: Diagnosis not present

## 2019-11-23 DIAGNOSIS — I83893 Varicose veins of bilateral lower extremities with other complications: Secondary | ICD-10-CM

## 2019-11-23 NOTE — Progress Notes (Deleted)
Primary Physician/Referring:  Perrin Maltese, MD  Patient ID: [name] [name], male    DOB: 06-Jun-1936, 84 y.o.   MRN: [id number]  No chief complaint on file.  HPI:    [name] [name]  is a 84 y.o. Caucasian male with known CAD, hyperlipidemia, hypertension and esophageal stricture S/P dilatation remotely, multiple coronary interventions and eventually underwent CABG 2 in 2010. For NSTEMI, he has had SVG to D2 stent in 2010 and again in January 2017, patent stents by angiography in January 2018.  He has had 2 coronary intervention to SVG to D2 with placement of the proximal stent in 2010 and mid segment stent in January 2017 when he presented with NSTEMI  in [place].    Denies any chest pain, PND, or orthopnea.  He is presently doing well and denies any new symptoms.  Tolerating all his medications well.  His main complaints are chronic back pain, severe spinal stenosis and bilateral loss of coordination and loss of sensation in the feet.  He has been trying to avoid surgery due to his cardiac issues.  He has severe peripheral neuropathy, complete loss of sensation in both feet, and severe statin intolerance with rhabdomyolysis history in the past.   Past Medical History:  Diagnosis Date  . Arthritis   . Cancer [facility]) 1995   prostate ca   . Coronary artery disease   . Esophageal stricture 08/2016  . GERD (gastroesophageal reflux disease)   . Glaucoma   . History of hiatal hernia   . Hypertension   . MI (mitral incompetence)   . Peripheral neuropathy   . Peripheral vascular disease ([place])   . Vertigo    Past Surgical History:  Procedure Laterality Date  . COLONOSCOPY    . CORONARY ANGIOPLASTY WITH STENT PLACEMENT  2005   x9   . CORONARY ARTERY BYPASS GRAFT  2011  . CORONARY STENT PLACEMENT  07/2014  . ESOPHAGOGASTRODUODENOSCOPY    . ESOPHAGOGASTRODUODENOSCOPY (EGD) WITH ESOPHAGEAL DILATION  2018  . knee scope Bilateral   . LEG SURGERY Right 09/2018  . LIPOMA EXCISION N/A  09/24/2016   Procedure: EXCISION SUBCUTANEOUS LIPOMA ON UPPER BACK;  Surgeon: Donnie Mesa, MD;  Location: [place];  Service: General;  Laterality: N/A;  . Prostectomy  1995  . REVERSE SHOULDER ARTHROPLASTY Left 09/25/2017  . REVERSE SHOULDER ARTHROPLASTY Left 09/25/2017   Procedure: LEFT REVERSE SHOULDER ARTHROPLASTY; deltoid repair;  Surgeon: Netta Cedars, MD;  Location: [place];  Service: Orthopedics;  Laterality: Left;  . ROTATOR CUFF REPAIR Left 2001   Social History   Tobacco Use  . Smoking status: Never Smoker  . Smokeless tobacco: Never Used  Substance Use Topics  . Alcohol use: Yes    Alcohol/week: 4.0 standard drinks    Types: 4 Glasses of wine per week    Comment: wine with dinner    ROS  Review of Systems  Cardiovascular: Positive for leg swelling. Negative for chest pain and dyspnea on exertion.  Musculoskeletal: Positive for back pain.  Gastrointestinal: Negative for melena.  Neurological: Positive for disturbances in coordination, numbness and paresthesias.   Objective  There were no vitals taken for this visit.  Vitals with BMI 11/23/2019 09/22/2019 07/24/2019  Height '6\' 0"'  '6\' 0"'  '6\' 0"'   Weight 195 lbs 198 lbs 2 oz 195 lbs  BMI 26.44 87.86 76.72  Systolic 094 709 628  Diastolic 67 60 69  Pulse 57 64 60    No data found.  Physical Exam  Constitutional: He appears well-developed and well-nourished.  Cardiovascular: Normal rate, regular rhythm, normal heart sounds and intact distal pulses. Exam reveals no gallop.  No murmur heard. No leg edema, no JVD.  Pulmonary/Chest: Effort normal and breath sounds normal.  Abdominal: Soft. Bowel sounds are normal.  Musculoskeletal:     Cervical back: Neck supple.   Laboratory examination:   Recent Labs    07/03/19 2345  NA 137  K 4.1  CL 101  CO2 28  GLUCOSE 101*  BUN 14  CREATININE 0.90  CALCIUM 8.9  GFRNONAA >60  GFRAA >60   CrCl cannot be calculated (Patient's most recent lab result is older than the maximum  21 days allowed.).  CMP Latest Ref Rng & Units 07/03/2019 09/16/2017 09/23/2016  Glucose 70 - 99 mg/dL 101(H) 121(H) 115(H)  BUN 8 - 23 mg/dL '14 13 9  ' Creatinine 0.61 - 1.24 mg/dL 0.90 0.84 0.84  Sodium 135 - 145 mmol/L 137 137 139  Potassium 3.5 - 5.1 mmol/L 4.1 4.2 4.0  Chloride 98 - 111 mmol/L 101 104 104  CO2 22 - 32 mmol/L 28 21(L) 24  Calcium 8.9 - 10.3 mg/dL 8.9 9.0 8.9  Total Protein 6.5 - 8.1 g/dL 6.6 - -  Total Bilirubin 0.3 - 1.2 mg/dL 0.7 - -  Alkaline Phos 38 - 126 U/L 44 - -  AST 15 - 41 U/L 12(L) - -  ALT 0 - 44 U/L 15 - -   CBC Latest Ref Rng & Units 07/03/2019 09/16/2017 09/23/2016  WBC 4.0 - 10.5 K/uL 4.3 5.0 4.4  Hemoglobin 13.0 - 17.0 g/dL 12.7(L) 13.7 12.8(L)  Hematocrit 39.0 - 52.0 % 38.3(L) 40.8 38.8(L)  Platelets 150 - 400 K/uL 166 156 146(L)   External labs  Labs 09/12/2019: Hb 13.4/15.6, platelets 178, normal indicis.  Serum glucose 96 mg, BMP normal N, EGFR >60 mL.  A1c 5.4%.  TSH normal.  Total cholesterol 164, triglycerides 171, HDL 47, LDL 88.  Vitamin D 36.4.  Basic Metabolic PanelResulted: 7/68/1157 5:15 PM [facility] Component Name Value Ref Range Sodium 134 (L) 135 - 146 MMOL/L Potassium 4.0  Comment: NO VISIBLE HEMOLYSIS 3.5 - 5.3 MMOL/L Chloride 103 98 - 110 MMOL/L CO2 27 23 - 30 MMOL/L BUN 19 8 - 24 MG/DL Glucose 88  Comment: Patients taking eltrombopag at doses >/= 100 mg daily may show falsely elevated values of 10% or greater. 70 - 99 MG/DL Creatinine 0.87 0.5 - 1.5 MG/DL Calcium 9.0 8.5 - 10.5 MG/DL Anion Gap 4 4 - 14 MMOL/L Est. GFR Non-African American 81  Comment: GFR estimated by CKD-EPI equations, reportable up to 90 ML/MIN/1.73 M*2 >=60 ML/MIN/1.73 M*2   BNP 93.0 08/13/2019   Hb 12.8/HCT 37.3, platelets 144.  08/13/2019      Medications and allergies   Allergies  Allergen Reactions  . Atorvastatin Other (See Comments)    Joint pain  . Rosuvastatin Other (See Comments)    Joint pain  .  Statins Other (See Comments)    Leg pain   . Folic Acid-Vit W6-OMB T59 Anxiety, Itching and Nausea Only  . Hydrocodone Rash  . Pravastatin Nausea Only     Current Outpatient Medications  Medication Instructions  . acetaminophen (TYLENOL) 650 mg, Oral, As needed, 2 tablets in the morning and 2 tablets at bedtime  . Alirocumab (PRALUENT) 150 MG/ML SOAJ 1 pen, Subcutaneous, Every 14 days  . amLODipine (NORVASC) 10 mg, Oral, Daily  . aspirin EC 81 mg, Oral,  Daily  . azelastine (ASTELIN) 0.1 % nasal spray 1 spray, Each Nare, 2 times daily, Use in each nostril as directed  . azelastine (OPTIVAR) 0.05 % ophthalmic solution 1 drop 2 (two) times daily  . calcium carbonate (TUMS - DOSED IN MG ELEMENTAL CALCIUM) 500 MG chewable tablet 2 tablets, Oral, Daily PRN  . cetirizine (ZYRTEC) 10 mg, Oral, Daily  . cholecalciferol (VITAMIN D) 1,000 Units, Oral, Daily  . Cyanocobalamin (B-12) 1000 MCG/ML KIT 1 mL, Injection, Every 30 days  . cycloSPORINE (RESTASIS) 0.05 % ophthalmic emulsion 1 drop, Both Eyes, 2 times daily  . Infant Care Products (BABY SHAMPOO EX) Rub into a wash cloth and rub into the eyes 3 to 4 times daily for dry eyes / eye build up  . latanoprost (XALATAN) 0.005 % ophthalmic solution 1 drop, Both Eyes, Daily at bedtime  . lisinopril (ZESTRIL) 20 mg, Oral, 2 times daily  . metoprolol tartrate (LOPRESSOR) 12.5 mg, Oral, 2 times daily  . nitroGLYCERIN (NITROSTAT) 0.4 mg, Sublingual, Every 5 min PRN  . Olopatadine HCl 0.2 % SOLN 1 drop, Both Eyes, Daily  . Pancrelipase, Lip-Prot-Amyl, (CREON PO) Oral, Prescribed samples from Dr. Benson [place]   . pantoprazole (PROTONIX) 40 mg, Oral, BH-each morning  . Polyethyl Glycol-Propyl Glycol (SYSTANE OP) 1 drop, Ophthalmic, 5 times daily PRN  . sucralfate (CARAFATE) 1 g, Oral, 2 times daily  . zolpidem (AMBIEN) 10 mg, Oral, Daily at bedtime    Radiology:  VAS [place] LOWER EXTREMITY VENOUS REFLUX  Result Date: 11/23/2019  Lower Venous Reflux Study  Indications: Edema. Other Indications: CABG 07/16/2009 Left GSV harvested. Comparison Study: No prior exam. Performing Technologist: Alvia Grove RVT  Examination Guidelines: A complete evaluation includes B-mode imaging, spectral Doppler, color Doppler, and power Doppler as needed of all accessible portions of each vessel. Bilateral testing is considered an integral part of a complete examination. Limited examinations for reoccurring indications may be performed as noted. The reflux portion of the exam is performed with the patient in reverse Trendelenburg. Significant venous reflux is defined as >500 ms in the superficial venous system, and >1 second in the deep venous system.  Venous Reflux Times +--------------+---------+------+-----------+------------+--------+ RIGHT         Reflux NoRefluxReflux TimeDiameter cmsComments                         Yes                                  +--------------+---------+------+-----------+------------+--------+ CFV                     yes   >1 second                      +--------------+---------+------+-----------+------------+--------+ GSV at SFJ              yes    >500 ms      0.69             +--------------+---------+------+-----------+------------+--------+ GSV prox thigh          yes    >500 ms      0.46             +--------------+---------+------+-----------+------------+--------+ GSV mid thigh           yes    >500 ms      0.34             +--------------+---------+------+-----------+------------+--------+  GSV dist thigh                              0.46             +--------------+---------+------+-----------+------------+--------+ GSV at knee                                 0.46             +--------------+---------+------+-----------+------------+--------+ GSV prox calf           yes    >500 ms      0.28             +--------------+---------+------+-----------+------------+--------+ GSV mid calf                                 0.30             +--------------+---------+------+-----------+------------+--------+ SSV Pop Fossa                               0.48             +--------------+---------+------+-----------+------------+--------+ SSV prox calf           yes    >500 ms      0.21             +--------------+---------+------+-----------+------------+--------+ SSV mid calf                                0.19             +--------------+---------+------+-----------+------------+--------+  +--------------+---------+------+-----------+------------+---------+ LEFT          Reflux NoRefluxReflux TimeDiameter cmsComments                          Yes                                   +--------------+---------+------+-----------+------------+---------+ CFV                     yes   >1 second                       +--------------+---------+------+-----------+------------+---------+ GSV at SFJ                                  0.69              +--------------+---------+------+-----------+------------+---------+ GSV prox thigh                                      Harvested +--------------+---------+------+-----------+------------+---------+ GSV mid thigh                                       Harvested +--------------+---------+------+-----------+------------+---------+ GSV dist thigh  Harvested +--------------+---------+------+-----------+------------+---------+ GSV at knee                                         Harvested +--------------+---------+------+-----------+------------+---------+ GSV prox calf                                       NV        +--------------+---------+------+-----------+------------+---------+ GSV mid calf                                        NV        +--------------+---------+------+-----------+------------+---------+ SSV Pop Fossa                                0.33              +--------------+---------+------+-----------+------------+---------+ SSV prox calf                               0.31              +--------------+---------+------+-----------+------------+---------+ SSV mid calf                                0.24              +--------------+---------+------+-----------+------------+---------+   Summary: Right: - No evidence of deep vein thrombosis seen in the right lower extremity, from the common femoral through the popliteal veins. - Venous reflux is noted in the right common femoral vein. - Venous reflux is noted in the right sapheno-femoral junction. - Venous reflux is noted in the right greater saphenous vein in the thigh. - Venous reflux is noted in the right greater saphenous vein in the calf. - Venous reflux is noted in the right short saphenous vein.  Left: - No evidence of deep vein thrombosis seen in the left lower extremity, from the common femoral through the popliteal veins. - No evidence of superficial venous reflux seen in the left short saphenous vein. - Venous reflux is noted in the left common femoral vein. - Greater saphenous vein harvested for CABG.  *See table(s) above for measurements and observations. Electronically signed by Ruta Hinds MD on 11/23/2019 at 2:41:23 PM.    Final    Cardiac Studies:   Coronary Angiogram   [2015/08/01]: ( repeat cath 07/29/2016:) Prox LAD: 100% stenosis, LIMA to LAD graft patent, Mid RCA: 20% stenosis; SVG to D2: 100% stenosis in the middle third of the graft, s/p stenting with 3.5 x 33 Promus premier DES. Stent in the proximal graft, 3.5x33 mm Xience DES placed March 2014 widely patent. Diffuse native vessel disease. Codominant system. Normal LVEF.    Echocardiogram 02/06/2016: Left ventricle cavity is normal in size. Mild concentric hypertrophy of the left ventricle.  Mild basal septal hypertrophy. Normal global wall motion. Normal diastolic filling pattern. Calculated EF 69%.  Left atrial cavity is moderately dilated at 4.7 cm. Mild (Grade I) aortic regurgitation. Mild to moderate mitral regurgitation. Mild to moderate tricuspid regurgitation. Borderline elevation in PA pressure at 30 mm Hg and suggests  mild pulmonary hypertension. IVC is dilated with respiratory variation. This may suggests elevated right heart pressure.  EKG:  06/24/2019: Normal sinus rhythm/sinus bradycardia at the rate of 54 bpm with first-degree AV block, normal axis, inferior infarct old.  Low-voltage complexes.  No evidence of ischemia.    Assessment   No diagnosis found.  Recommendations:   No orders of the defined types were placed in this encounter.   [name] [name]  is a 84 y.o. Caucasian male with known CAD, hyperlipidemia, hypertension and esophageal stricture S/P dilatation remotely, multiple coronary interventions and eventually underwent CABG 2 in 2010. For NSTEMI, he has had SVG to D2 stent in 2010 and again in January 2017, patent stents by angiography in January 2018.  He has severe peripheral neuropathy, chronic back pain and statin intolerance. I had last seen him 2 years ago and he is re-establishing with me.  He remains angina free, no clinical evidence heart failure, EKG is unchanged.  I reviewed his external labs, lipids are not well controlled.  I want him for LDL goal closer to 50 if not at least <70 mg percent.  Advised him to increase Praluent, his PCP is already got it approved, I will send her this letter about increasing the dose to 150 mg.  I completely agree with her that if triglycerides are not well controlled after being on Praluent 953 mg, he would certainly qualify for being on Vascepa for cardiovascular protection.  With regard to peripheral neuropathy, I do not suspect peripheral arterial disease.  This is chronic.  He can try supplementation of B1 and B6 vitamins along with B12 that he is already on.  I will see him back in 6 months.  Adrian Prows, MD,  [facility] 11/23/2019, 3:38 PM [facility] [place] Pager: [phone] Office: [phone]

## 2019-11-23 NOTE — Progress Notes (Signed)
Referring Physician: Georgian Co FNP  Patient name: [name] [name] MRN: [id number] DOB: [birth year] Sex: male  REASON FOR CONSULT: Varicose veins with skin changes  HPI: [name] [name] is a 84 y.o. male, with a longstanding history of varicose veins primarily in the right leg. However, he was recently seen by his primary care provider noted to have brawny staining of the medial aspect of the gaiter area of both legs. Patient states this is never really bothered him. He has never had any skin breakdown or ulcers. He did have a left greater saphenous harvested for coronary bypass in the remote past. He has a history of fairly extensive peripheral neuropathy thought to be secondary to statin use. He states that he has fallen several times over the past year and has become very unsteady on his feet. He has not really developed much swelling in his legs. He has worn some light weight compression in the past but currently is not wearing compression stockings. Other medical problems include coronary artery disease, hypertension, arthritis all of which have been stable.  Past Medical History:  Diagnosis Date  . Arthritis   . Cancer [facility]) 1995   prostate ca   . Coronary artery disease   . Esophageal stricture 08/2016  . GERD (gastroesophageal reflux disease)   . Glaucoma   . History of hiatal hernia   . Hypertension   . MI (mitral incompetence)   . Peripheral neuropathy   . Peripheral vascular disease ([place])   . Vertigo    Past Surgical History:  Procedure Laterality Date  . COLONOSCOPY    . CORONARY ANGIOPLASTY WITH STENT PLACEMENT  2005   x9   . CORONARY ARTERY BYPASS GRAFT  2011  . CORONARY STENT PLACEMENT  07/2014  . ESOPHAGOGASTRODUODENOSCOPY    . ESOPHAGOGASTRODUODENOSCOPY (EGD) WITH ESOPHAGEAL DILATION  2018  . knee scope Bilateral   . LEG SURGERY Right 09/2018  . LIPOMA EXCISION N/A 09/24/2016   Procedure: EXCISION SUBCUTANEOUS LIPOMA ON UPPER BACK;  Surgeon: Donnie Mesa, MD;   Location: [place];  Service: General;  Laterality: N/A;  . Prostectomy  1995  . REVERSE SHOULDER ARTHROPLASTY Left 09/25/2017  . REVERSE SHOULDER ARTHROPLASTY Left 09/25/2017   Procedure: LEFT REVERSE SHOULDER ARTHROPLASTY; deltoid repair;  Surgeon: Netta Cedars, MD;  Location: [place];  Service: Orthopedics;  Laterality: Left;  . ROTATOR CUFF REPAIR Left 2001    Family History  Problem Relation Age of Onset  . Alzheimer's disease Mother   . Stroke Father   . Parkinson's disease Sister   . Cancer Sister   . Obesity Sister   . Diabetes Mellitus II Sister     SOCIAL HISTORY: Social History   Socioeconomic History  . Marital status: Single    Spouse name: Not on file  . Number of children: 6  . Years of education: Not on file  . Highest education level: Not on file  Occupational History  . Not on file  Tobacco Use  . Smoking status: Never Smoker  . Smokeless tobacco: Never Used  Substance and Sexual Activity  . Alcohol use: Yes    Alcohol/week: 4.0 standard drinks    Types: 4 Glasses of wine per week    Comment: wine with dinner  . Drug use: No  . Sexual activity: Not on file  Other Topics Concern  . Not on file  Social History Narrative  . Not on file   Social Determinants of Health   Financial Resource Strain:   .  Difficulty of Paying Living Expenses:   Food Insecurity:   . Worried About Charity fundraiser in the Last Year:   . Arboriculturist in the Last Year:   Transportation Needs:   . Film/video editor (Medical):   Marland Kitchen Lack of Transportation (Non-Medical):   Physical Activity:   . Days of Exercise per Week:   . Minutes of Exercise per Session:   Stress:   . Feeling of Stress :   Social Connections:   . Frequency of Communication with Friends and Family:   . Frequency of Social Gatherings with Friends and Family:   . Attends Religious Services:   . Active Member of Clubs or Organizations:   . Attends Archivist Meetings:   Marland Kitchen Marital  Status:   Intimate Partner Violence:   . Fear of Current or Ex-Partner:   . Emotionally Abused:   Marland Kitchen Physically Abused:   . Sexually Abused:     Allergies  Allergen Reactions  . Atorvastatin Other (See Comments)    Joint pain  . Rosuvastatin Other (See Comments)    Joint pain  . Statins Other (See Comments)    Leg pain   . Folic Acid-Vit [id number] D66 Anxiety, Itching and Nausea Only  . Hydrocodone Rash  . Pravastatin Nausea Only    Current Outpatient Medications  Medication Sig Dispense Refill  . acetaminophen (TYLENOL) 650 MG CR tablet Take 650 mg by mouth as needed for pain. 2 tablets in the morning and 2 tablets at bedtime    . Alirocumab (PRALUENT) 150 MG/ML SOAJ Inject 1 pen into the skin every 14 (fourteen) days. 6 pen 4  . amLODipine (NORVASC) 10 MG tablet Take 1 tablet (10 mg total) by mouth daily. 90 tablet 3  . aspirin EC 81 MG tablet Take 81 mg by mouth daily.    Marland Kitchen azelastine (ASTELIN) 0.1 % nasal spray Place 1 spray into both nostrils 2 (two) times daily. Use in each nostril as directed    . azelastine (OPTIVAR) 0.05 % ophthalmic solution 1 drop 2 (two) times daily    . calcium carbonate (TUMS - DOSED IN MG ELEMENTAL CALCIUM) 500 MG chewable tablet Chew 2 tablets by mouth daily as needed for indigestion or heartburn.    . cetirizine (ZYRTEC) 10 MG tablet Take 10 mg by mouth daily.    . cholecalciferol (VITAMIN D) 1000 units tablet Take 1,000 Units by mouth daily.    . Cyanocobalamin (B-12) 1000 MCG/ML KIT Inject 1 mL as directed every 30 (thirty) days.    . cycloSPORINE (RESTASIS) 0.05 % ophthalmic emulsion Place 1 drop into both eyes 2 (two) times daily.    . Infant Care Products (BABY SHAMPOO EX) Rub into a wash cloth and rub into the eyes 3 to 4 times daily for dry eyes / eye build up    . latanoprost (XALATAN) 0.005 % ophthalmic solution Place 1 drop into both eyes at bedtime.  0  . lisinopril (PRINIVIL,ZESTRIL) 20 MG tablet Take 20 mg by mouth 2 (two) times daily.      . metoprolol tartrate (LOPRESSOR) 25 MG tablet Take 0.5 tablets (12.5 mg total) by mouth 2 (two) times daily. 180 tablet 3  . nitroGLYCERIN (NITROSTAT) 0.4 MG SL tablet Place 0.4 mg under the tongue every 5 (five) minutes as needed for chest pain.    Marland Kitchen Olopatadine HCl 0.2 % SOLN Place 1 drop into both eyes daily.    . Pancrelipase, Lip-Prot-Amyl, (CREON PO) Take  by mouth. Prescribed samples from Dr. Benson [place]    . pantoprazole (PROTONIX) 40 MG tablet Take 40 mg by mouth every morning.     Vladimir Faster Glycol-Propyl Glycol (SYSTANE OP) Apply 1 drop to eye 5 (five) times daily as needed (dry eyes).    . sucralfate (CARAFATE) 1 g tablet Take 1 g by mouth 2 (two) times daily.     Marland Kitchen zolpidem (AMBIEN) 10 MG tablet Take 10 mg by mouth at bedtime.      No current facility-administered medications for this visit.    ROS:   General:  No weight loss, Fever, chills  HEENT: No recent headaches, no nasal bleeding, no visual changes, no sore throat  Neurologic: No dizziness, blackouts, seizures. No recent symptoms of stroke or mini- stroke. No recent episodes of slurred speech, or temporary blindness.  Cardiac: No recent episodes of chest pain/pressure, no shortness of breath at rest.  No shortness of breath with exertion.  Denies history of atrial fibrillation or irregular heartbeat  Vascular: No history of rest pain in feet.  No history of claudication.  No history of non-healing ulcer, No history of DVT   Pulmonary: No home oxygen, no productive cough, no hemoptysis,  No asthma or wheezing  Musculoskeletal:  '[X]'  Arthritis, '[ ]'  Low back pain,  '[X]'  Joint pain  Hematologic:No history of hypercoagulable state.  No history of easy bleeding.  No history of anemia  Gastrointestinal: No hematochezia or melena,  No gastroesophageal reflux, no trouble swallowing  Urinary: '[ ]'  chronic Kidney disease, '[ ]'  on HD - '[ ]'  MWF or '[ ]'  TTHS, '[ ]'  Burning with urination, '[ ]'  Frequent urination, '[ ]'  Difficulty urinating;     Skin: No rashes  Psychological: No history of anxiety,  No history of depression   Physical Examination  Vitals:   11/23/19 1437  BP: 129/67  Pulse: (!) 57  Resp: 18  Temp: 98.1 F (36.7 C)  TempSrc: Temporal  SpO2: 99%  Weight: 195 lb (88.5 kg)  Height: 6' (1.829 m)    Body mass index is 26.45 kg/m.  General:  Alert and oriented, no acute distress HEENT: Normal Neck: No JVD Cardiac: Regular Rate and Rhythm Skin: No rash, brawny hemosiderin staining gaiter area of bilateral legs a few scattered small varicosities right greater than left Extremity Pulses:  2+ radial, brachial, femoral, dorsalis pedis pulses bilaterally Musculoskeletal: No deformity or edema  Neurologic: Upper and lower extremity motor 5/5 and symmetric  DATA:  Patient had a venous reflux exam today which showed some reflux in the high thigh portion of the right leg. Left greater saphenous had been harvested. He did have some mild reflux in the right lesser saphenous vein as well. There was no reflux in the left leg.  ASSESSMENT: Mild venous reflux right lower extremity would not consider laser ablation at this point. Most of his symptoms are more related to neuropathy. He does have some brawny staining of the skin bilaterally. However, patient was reassured today that he has a normal arterial exam and is not at risk of limb loss. He was given a prescription for compression stockings for controlling symptoms of mild varicose veins by decreasing edema. He is also counseled on how to elevate his legs. I did discuss with him that there is some risk that he could develop ulcerations with continued pressurized skin but overall this was fairly low risk. I discussed with him that elevating his legs at the end of every day and wearing  compression stockings during the day would greatly reduce this risk.   PLAN: Patient was given a prescription today for 20 to 30 mm compression stockings knee-high. He will wear these  during the day.  He will follow-up with [place] on an as-needed basis.  We will elevate his legs when appropriate and possible.  Ruta Hinds, MD [facility] Office: [phone]

## 2019-11-24 ENCOUNTER — Ambulatory Visit: Payer: Medicare HMO | Admitting: Cardiology

## 2019-11-26 NOTE — Progress Notes (Signed)
Primary Physician/Referring:  Perrin Maltese, MD  Patient ID: [name] [name], male    DOB: 26-Mar-1936, 84 y.o.   MRN: [id number]  Chief Complaint  Patient presents with  . Coronary Artery Disease  . Follow-up    leg swelling, discuss starting repatha   . Hyperlipidemia   HPI:    [name] [name]  is a 84 y.o. Caucasian male with known CAD, hyperlipidemia, hypertension and esophageal stricture S/P dilatation remotely, multiple coronary interventions and eventually underwent CABG 2 in 2010. For NSTEMI, he has had SVG to D2 stent in 2010 and again in January 2017, patent stents by angiography in January 2018.  He has had 2 coronary intervention to SVG to D2 with placement of the proximal stent in 2010 and mid segment stent in January 2017 when he presented with NSTEMI  in [place].  He has had difficulty in obtaining Praluent approved by his insurance, now off of Praluent for the past 4 weeks.  We had got prior approval for Repatha, but he wanted to further discuss with me.  Tolerating all his medications well.  His main complaints are chronic back pain, severe spinal stenosis and bilateral loss of coordination and loss of sensation in the feet.  He has been trying to avoid surgery due to his cardiac issues.  He has severe peripheral neuropathy, complete loss of sensation in both feet, and severe statin intolerance with rhabdomyolysis history in the past.  He has noticed occasional episodes of marked dizziness when he stands up.  Past Medical History:  Diagnosis Date  . Arthritis   . Cancer [facility]) 1995   prostate ca   . Coronary artery disease   . Esophageal stricture 08/2016  . GERD (gastroesophageal reflux disease)   . Glaucoma   . History of hiatal hernia   . Hypertension   . MI (mitral incompetence)   . Peripheral neuropathy   . Peripheral vascular disease ([place])   . Vertigo    Past Surgical History:  Procedure Laterality Date  . COLONOSCOPY    . CORONARY ANGIOPLASTY WITH  STENT PLACEMENT  2005   x9   . CORONARY ARTERY BYPASS GRAFT  2011  . CORONARY STENT PLACEMENT  07/2014  . ESOPHAGOGASTRODUODENOSCOPY    . ESOPHAGOGASTRODUODENOSCOPY (EGD) WITH ESOPHAGEAL DILATION  2018  . knee scope Bilateral   . LEG SURGERY Right 09/2018  . LIPOMA EXCISION N/A 09/24/2016   Procedure: EXCISION SUBCUTANEOUS LIPOMA ON UPPER BACK;  Surgeon: Donnie Mesa, MD;  Location: [place];  Service: General;  Laterality: N/A;  . Prostectomy  1995  . REVERSE SHOULDER ARTHROPLASTY Left 09/25/2017  . REVERSE SHOULDER ARTHROPLASTY Left 09/25/2017   Procedure: LEFT REVERSE SHOULDER ARTHROPLASTY; deltoid repair;  Surgeon: Netta Cedars, MD;  Location: [place];  Service: Orthopedics;  Laterality: Left;  . ROTATOR CUFF REPAIR Left 2001   Family History  Problem Relation Age of Onset  . Alzheimer's disease Mother   . Stroke Father   . Parkinson's disease Sister   . Cancer Sister   . Obesity Sister   . Diabetes Mellitus II Sister    Social History   Tobacco Use  . Smoking status: Never Smoker  . Smokeless tobacco: Never Used  Substance Use Topics  . Alcohol use: Yes    Alcohol/week: 7.0 standard drinks    Types: 7 Glasses of wine per week    Comment: wine with dinner    Marital Status: Single ROS  Review of Systems  Cardiovascular: Positive for  leg swelling. Negative for chest pain and dyspnea on exertion.  Musculoskeletal: Positive for back pain.  Gastrointestinal: Negative for melena.  Neurological: Positive for disturbances in coordination, dizziness, numbness and paresthesias.   Objective  Blood pressure (!) 149/65, pulse 63, resp. rate 16, height 6' (1.829 m), weight 200 lb (90.7 kg), SpO2 97 %.  Vitals with BMI 11/30/2019 11/23/2019 09/22/2019  Height '6\' 0"'  '6\' 0"'  '6\' 0"'   Weight 200 lbs 195 lbs 198 lbs 2 oz  BMI 27.12 16.10 96.04  Systolic 540 981 191  Diastolic 65 67 60  Pulse 63 57 64    Orthostatic VS for the past 72 hrs (Last 3 readings):  Orthostatic BP Patient  Position BP Location Cuff Size Orthostatic Pulse  11/30/19 1648 144/65 Sitting Left Arm -- 66  11/30/19 1647 122/68 Standing Left Arm -- --  11/30/19 1622 -- Sitting Left Arm Normal --     Physical Exam  Constitutional: He appears well-developed and well-nourished.  Cardiovascular: Normal rate, regular rhythm, normal heart sounds and intact distal pulses. Exam reveals no gallop.  No murmur heard. No leg edema, no JVD.  Pulmonary/Chest: Effort normal and breath sounds normal.  Abdominal: Soft. Bowel sounds are normal.   Laboratory examination:   Recent Labs    07/03/19 2345  NA 137  K 4.1  CL 101  CO2 28  GLUCOSE 101*  BUN 14  CREATININE 0.90  CALCIUM 8.9  GFRNONAA >60  GFRAA >60   CrCl cannot be calculated (Patient's most recent lab result is older than the maximum 21 days allowed.).  CMP Latest Ref Rng & Units 07/03/2019 09/16/2017 09/23/2016  Glucose 70 - 99 mg/dL 101(H) 121(H) 115(H)  BUN 8 - 23 mg/dL '14 13 9  ' Creatinine 0.61 - 1.24 mg/dL 0.90 0.84 0.84  Sodium 135 - 145 mmol/L 137 137 139  Potassium 3.5 - 5.1 mmol/L 4.1 4.2 4.0  Chloride 98 - 111 mmol/L 101 104 104  CO2 22 - 32 mmol/L 28 21(L) 24  Calcium 8.9 - 10.3 mg/dL 8.9 9.0 8.9  Total Protein 6.5 - 8.1 g/dL 6.6 - -  Total Bilirubin 0.3 - 1.2 mg/dL 0.7 - -  Alkaline Phos 38 - 126 U/L 44 - -  AST 15 - 41 U/L 12(L) - -  ALT 0 - 44 U/L 15 - -   CBC Latest Ref Rng & Units 07/03/2019 09/16/2017 09/23/2016  WBC 4.0 - 10.5 K/uL 4.3 5.0 4.4  Hemoglobin 13.0 - 17.0 g/dL 12.7(L) 13.7 12.8(L)  Hematocrit 39.0 - 52.0 % 38.3(L) 40.8 38.8(L)  Platelets 150 - 400 K/uL 166 156 146(L)   External labs  09/12/2019:  Hb 13.4/15.6, platelets 178, normal indicis. Serum glucose 96 mg, BMP normal N, EGFR >60 mL. A1c 5.4%.  TSH normal. Total cholesterol 164, triglycerides 171, HDL 47, LDL 88. Vitamin D 36.4.  Basic Metabolic PanelResulted: 4/78/2956 5:15 PM [facility] Component Name Value Ref  Range Sodium 134 (L) 135 - 146 MMOL/L Potassium 4.0  Comment: NO VISIBLE HEMOLYSIS 3.5 - 5.3 MMOL/L Chloride 103 98 - 110 MMOL/L CO2 27 23 - 30 MMOL/L BUN 19 8 - 24 MG/DL Glucose 88  Comment: Patients taking eltrombopag at doses >/= 100 mg daily may show falsely elevated values of 10% or greater. 70 - 99 MG/DL Creatinine 0.87 0.5 - 1.5 MG/DL Calcium 9.0 8.5 - 10.5 MG/DL Anion Gap 4 4 - 14 MMOL/L Est. GFR Non-African American 81  Comment: GFR estimated by CKD-EPI equations, reportable up  to 90 ML/MIN/1.73 M*2 >=60 ML/MIN/1.73 M*2   BNP 93.0 08/13/2019   Hb 12.8/HCT 37.3, platelets 144.  08/13/2019    Medications and allergies   Allergies  Allergen Reactions  . Atorvastatin Other (See Comments)    Joint pain  . Rosuvastatin Other (See Comments)    Joint pain  . Statins Other (See Comments)    Leg pain   . Folic Acid-Vit T0-ZSW F09 Anxiety, Itching and Nausea Only  . Hydrocodone Rash  . Pravastatin Nausea Only     Current Outpatient Medications  Medication Instructions  . acetaminophen (TYLENOL) 650 mg, Oral, As needed, 2 tablets in the morning and 2 tablets at bedtime  . amLODipine (NORVASC) 10 mg, Oral, Daily  . aspirin EC 81 mg, Oral, Daily  . azelastine (ASTELIN) 0.1 % nasal spray 1 spray, Each Nare, 2 times daily, Use in each nostril as directed  . azelastine (OPTIVAR) 0.05 % ophthalmic solution 1 drop 2 (two) times daily  . calcium carbonate (TUMS - DOSED IN MG ELEMENTAL CALCIUM) 500 MG chewable tablet 2 tablets, Oral, Daily PRN  . cetirizine (ZYRTEC) 10 mg, Oral, Daily  . cholecalciferol (VITAMIN D) 1,000 Units, Oral, Daily  . Cyanocobalamin (B-12) 1000 MCG/ML KIT 1 mL, Injection, Every 30 days  . cycloSPORINE (RESTASIS) 0.05 % ophthalmic emulsion 1 drop, Both Eyes, 2 times daily  . Infant Care Products (BABY SHAMPOO EX) Rub into a wash cloth and rub into the eyes 3 to 4 times daily for dry eyes / eye build up  . latanoprost (XALATAN) 0.005 % ophthalmic solution 1  drop, Both Eyes, Daily at bedtime  . lisinopril (ZESTRIL) 20 mg, Oral, 2 times daily  . metoprolol tartrate (LOPRESSOR) 12.5 mg, Oral, 2 times daily  . nitroGLYCERIN (NITROSTAT) 0.4 mg, Sublingual, Every 5 min PRN  . Olopatadine HCl 0.2 % SOLN 1 drop, Both Eyes, Daily  . pantoprazole (PROTONIX) 40 mg, Oral, BH-each morning  . Polyethyl Glycol-Propyl Glycol (SYSTANE OP) 1 drop, Ophthalmic, 5 times daily PRN  . pregabalin (LYRICA) 50 mg, Oral, 2 times daily  . sucralfate (CARAFATE) 1 g, Oral, 2 times daily  . zolpidem (AMBIEN) 10 mg, Oral, Daily at bedtime    Radiology:  No results found.   Cardiac Studies:   Coronary Angiogram  07/24/2015: ( repeat cath 07/29/2016:) Prox LAD: 100% stenosis, LIMA to LAD graft patent, Mid RCA: 20% stenosis; SVG to D2: 100% stenosis in the middle third of the graft, s/p stenting with 3.5 x 33 Promus premier DES. Stent in the proximal graft, 3.5x33 mm Xience DES placed March 2014 widely patent. Diffuse native vessel disease. Codominant system. Normal LVEF.    Echocardiogram 02/06/2016: Left ventricle cavity is normal in size. Mild concentric hypertrophy of the left ventricle.  Mild basal septal hypertrophy. Normal global wall motion. Normal diastolic filling pattern. Calculated EF 69%. Left atrial cavity is moderately dilated at 4.7 cm. Mild (Grade I) aortic regurgitation. Mild to moderate mitral regurgitation. Mild to moderate tricuspid regurgitation. Borderline elevation in PA pressure at 30 mm Hg and suggests mild pulmonary hypertension. IVC is dilated with respiratory variation. This may suggests elevated right heart pressure.  EKG:  06/24/2019: Normal sinus rhythm/sinus bradycardia at the rate of 54 bpm with first-degree AV block, normal axis, inferior infarct old.  Low-voltage complexes.  No evidence of ischemia.    Assessment     ICD-10-CM   1. Coronary artery disease involving native coronary artery of native heart without angina pectoris  I25.10  Evolocumab SOSY  140 mg  2. Hx of CABG 2010: LIMA to LAD; SVG to D2  Z95.1   3. Hypercholesteremia  E78.00 Evolocumab SOSY 140 mg    Lipid Panel With LDL/HDL Ratio    Lipid Panel With LDL/HDL Ratio  4. Statin intolerance, Rhabdomyolysis  Z78.9   5. Orthostatic hypotension  I95.1      Meds ordered this encounter  Medications  . Evolocumab SOSY 140 mg   Medications Discontinued During This Encounter  Medication Reason  . Alirocumab (PRALUENT) 150 MG/ML SOAJ Cost of medication  . Pancrelipase, Lip-Prot-Amyl, (CREON PO) Patient has not taken in last 30 days    Recommendations:   [name] [name]  is a 84 y.o. Caucasian male with known CAD, hyperlipidemia, hypertension and esophageal stricture S/P dilatation remotely, multiple coronary interventions and eventually underwent CABG 2 in 2010. For NSTEMI, he has had SVG to D2 stent in 2010 and again in January 2017, patent stents by angiography in January 2018.  He has severe peripheral neuropathy, chronic back pain and statin intolerance.  I discussed differences between Praluent or Repatha in detail.  He is now willing to try Repatha, I went ahead and administered 1 dose today as prior approval will be easy to obtain for Repatha for now.  With regard to coronary artery disease, he remains angina free.  His episodes of dizziness and also tingling and numbness in his feet is related to peripheral neuropathy he has orthostatic hypotension as well.  Although his blood pressure is mildly elevated, orthostatic hypotension and orthostatic blood pressure is within range hence no changes in the medications were done today.  I will repeat his lipid profile testing in 4 weeks and see him back in 6 months.  No recurrence of angina, no clinical evidence of heart failure from cardiac standpoint.  Given : 140 mg :  : Subcutaneous 11/30/19 1652 11/30/19 1658 Elyn Peers, [place] Right Lower Abdomen NDC [place], MD, [facility] 11/30/2019, 9:17  PM [facility] Pager: [phone] Office: [phone]

## 2019-11-30 ENCOUNTER — Other Ambulatory Visit: Payer: Self-pay

## 2019-11-30 ENCOUNTER — Encounter: Payer: Self-pay | Admitting: Cardiology

## 2019-11-30 ENCOUNTER — Telehealth: Payer: Self-pay

## 2019-11-30 ENCOUNTER — Ambulatory Visit: Payer: Medicare HMO | Admitting: Cardiology

## 2019-11-30 VITALS — BP 149/65 | HR 63 | Resp 16 | Ht 72.0 in | Wt 200.0 lb

## 2019-11-30 DIAGNOSIS — Z951 Presence of aortocoronary bypass graft: Secondary | ICD-10-CM

## 2019-11-30 DIAGNOSIS — I951 Orthostatic hypotension: Secondary | ICD-10-CM

## 2019-11-30 DIAGNOSIS — Z789 Other specified health status: Secondary | ICD-10-CM

## 2019-11-30 DIAGNOSIS — E7800 Pure hypercholesterolemia, unspecified: Secondary | ICD-10-CM

## 2019-11-30 DIAGNOSIS — I2510 Atherosclerotic heart disease of native coronary artery without angina pectoris: Secondary | ICD-10-CM

## 2019-11-30 MED ORDER — EVOLOCUMAB 140 MG/ML ~~LOC~~ SOSY
140.0000 mg | PREFILLED_SYRINGE | Freq: Once | SUBCUTANEOUS | Status: AC
  Administered 2019-11-30: 140 mg via SUBCUTANEOUS

## 2019-11-30 NOTE — Telephone Encounter (Signed)
Faxed Praluent prior authorization to [facility] [phone]

## 2019-11-30 NOTE — Telephone Encounter (Signed)
Spoke with [employer]. They will be faxing a prior authorization to submit to [facility] to me today. [employer] had sent it to the wrong number.

## 2019-12-13 ENCOUNTER — Other Ambulatory Visit: Payer: Self-pay

## 2019-12-13 MED ORDER — REPATHA SURECLICK 140 MG/ML ~~LOC~~ SOAJ: 140.0000 mg | SUBCUTANEOUS | 6 refills | Status: DC

## 2019-12-20 ENCOUNTER — Other Ambulatory Visit: Payer: Self-pay

## 2019-12-20 MED ORDER — REPATHA SURECLICK 140 MG/ML ~~LOC~~ SOAJ: 140.0000 mg | SUBCUTANEOUS | 6 refills | Status: DC

## 2019-12-23 ENCOUNTER — Emergency Department: Payer: Medicare HMO

## 2019-12-23 ENCOUNTER — Emergency Department
Admission: EM | Admit: 2019-12-23 | Discharge: 2019-12-23 | Disposition: A | Discharge status: Home / Self Care | Payer: Medicare HMO | Attending: Emergency Medicine | Admitting: Emergency Medicine

## 2019-12-23 ENCOUNTER — Other Ambulatory Visit: Payer: Self-pay

## 2019-12-23 ENCOUNTER — Encounter: Payer: Self-pay | Admitting: Emergency Medicine

## 2019-12-23 DIAGNOSIS — R112 Nausea with vomiting, unspecified: Secondary | ICD-10-CM | POA: Insufficient documentation

## 2019-12-23 DIAGNOSIS — R42 Dizziness and giddiness: Secondary | ICD-10-CM | POA: Diagnosis present

## 2019-12-23 DIAGNOSIS — R519 Headache, unspecified: Secondary | ICD-10-CM | POA: Diagnosis not present

## 2019-12-23 DIAGNOSIS — Z79899 Other long term (current) drug therapy: Secondary | ICD-10-CM | POA: Insufficient documentation

## 2019-12-23 DIAGNOSIS — I739 Peripheral vascular disease, unspecified: Secondary | ICD-10-CM | POA: Insufficient documentation

## 2019-12-23 DIAGNOSIS — Z7982 Long term (current) use of aspirin: Secondary | ICD-10-CM | POA: Insufficient documentation

## 2019-12-23 DIAGNOSIS — Z8546 Personal history of malignant neoplasm of prostate: Secondary | ICD-10-CM | POA: Diagnosis not present

## 2019-12-23 DIAGNOSIS — I10 Essential (primary) hypertension: Secondary | ICD-10-CM | POA: Diagnosis not present

## 2019-12-23 LAB — CBC
HCT: 40 % (ref 39.0–52.0)
Hemoglobin: 13.2 g/dL (ref 13.0–17.0)
MCH: 28.8 pg (ref 26.0–34.0)
MCHC: 33 g/dL (ref 30.0–36.0)
MCV: 87.1 fL (ref 80.0–100.0)
Platelets: 182 10*3/uL (ref 150–400)
RBC: 4.59 MIL/uL (ref 4.22–5.81)
RDW: 12.2 % (ref 11.5–15.5)
WBC: 8.1 10*3/uL (ref 4.0–10.5)
nRBC: 0 % (ref 0.0–0.2)

## 2019-12-23 LAB — COMPREHENSIVE METABOLIC PANEL
ALT: 19 U/L (ref 0–44)
AST: 16 U/L (ref 15–41)
Albumin: 4 g/dL (ref 3.5–5.0)
Alkaline Phosphatase: 44 U/L (ref 38–126)
Anion gap: 7 (ref 5–15)
BUN: 16 mg/dL (ref 8–23)
CO2: 26 mmol/L (ref 22–32)
Calcium: 8.9 mg/dL (ref 8.9–10.3)
Chloride: 102 mmol/L (ref 98–111)
Creatinine, Ser: 0.91 mg/dL (ref 0.61–1.24)
GFR calc Af Amer: >60 mL/min (ref >60)
GFR calc non Af Amer: >60 mL/min (ref >60)
Glucose, Bld: 111 mg/dL — ABNORMAL HIGH (ref 70–99)
Potassium: 4 mmol/L (ref 3.5–5.1)
Sodium: 135 mmol/L (ref 135–145)
Total Bilirubin: 0.8 mg/dL (ref 0.3–1.2)
Total Protein: 6.8 g/dL (ref 6.5–8.1)

## 2019-12-23 LAB — DIFFERENTIAL
Abs Immature Granulocytes: 0.04 10*3/uL (ref 0.00–0.07)
Basophils Absolute: 0 10*3/uL (ref 0.0–0.1)
Basophils Relative: 1 %
Eosinophils Absolute: 0.2 10*3/uL (ref 0.0–0.5)
Eosinophils Relative: 2 %
Immature Granulocytes: 1 %
Lymphocytes Relative: 18 %
Lymphs Abs: 1.5 10*3/uL (ref 0.7–4.0)
Monocytes Absolute: 0.6 10*3/uL (ref 0.1–1.0)
Monocytes Relative: 7 %
Neutro Abs: 5.8 10*3/uL (ref 1.7–7.7)
Neutrophils Relative %: 71 %

## 2019-12-23 LAB — APTT: aPTT: <24 seconds — ABNORMAL LOW (ref 24–36)

## 2019-12-23 LAB — PROTIME-INR
INR: 1 (ref 0.8–1.2)
Prothrombin Time: 12.5 seconds (ref 11.4–15.2)

## 2019-12-23 LAB — TROPONIN I (HIGH SENSITIVITY): Troponin I (High Sensitivity): 5 ng/L (ref <18)

## 2019-12-23 NOTE — ED Notes (Signed)
EMS brought pt in and states pt is having dizziness and vomiting. Pt states hx of MI in past with usual symptoms. VSS pt given 4 of zofran. Pt to be taken back now for EKG

## 2019-12-23 NOTE — ED Notes (Addendum)
See triage note, pt reports was in the bathroom having BM this morning when "room started spinning" and having dizziness along with vomiting. Pt reports difficulty standing which is abnormal, reports sometimes using a cane.  Hx multiple heart attacks.  Clear speech, alert and oriented.  Sinus brady on monitor.  Denies CP or SHOB.

## 2019-12-23 NOTE — ED Triage Notes (Addendum)
Patient to ER via [facility] From home for c/o weakness that began at approx 1015 this am. Patient states he developed nausea, feeling like head is spinning, difficulty standing up due to weakness ("weak all over"). Patient denies any history of CVA. States he has similar history with previous MI's. Reports "I just feel so weak like I want to go to sleep and like I can't hardly hold my eyes open". Reports when opening eyes that room starts spinning.  Patient reports 5 falls in the last year, last fall was 4 months ago. States he "hears heart beat in his left ear" since one of his falls, has fallen twice since then. Has not had CT scan since before this sensation.

## 2019-12-23 NOTE — Discharge Instructions (Addendum)
Please continue to follow-up with your primary care doctor please return for worsening episodes.  The CT and the MRI both show no acute problems.  There is no explanation on the MRI for the pulse or hearing in your ear.  Since your hearing is decreased also you can follow-up with Dr. Tami Ribas ear nose and throat.  He can work on your hearing and that noise further.

## 2019-12-23 NOTE — ED Provider Notes (Addendum)
[facility] Emergency Department Provider Note   ____________________________________________   First MD Initiated Contact with Patient 12/23/19 1300     (approximate)  I have reviewed the triage vital signs and the nursing notes.   HISTORY  Chief Complaint Weakness    HPI [name] [name] is a 84 y.o. male who reports he was in the bathroom having a stool when he began having vertigo with vomiting.  Then he had trouble standing because he was so vertiginous.  He had nausea along with it.  And vomiting.  He has had vertigo in the past but never anything like this.  When I saw him in the room his vertigo was much better.  He did say he felt his or heard his heartbeat in his left ear since one of his falls.   He had a mild headache and no neck pain.     Past Medical History:  Diagnosis Date  . Arthritis   . Cancer [facility]) 1995   prostate ca   . Coronary artery disease   . Esophageal stricture 08/2016  . GERD (gastroesophageal reflux disease)   . Glaucoma   . History of hiatal hernia   . Hypertension   . MI (mitral incompetence)   . Peripheral neuropathy   . Peripheral vascular disease ([place])   . Vertigo     Patient Active Problem List   Diagnosis Date Noted  . Exocrine pancreatic insufficiency 10/28/2019  . Peripheral vascular disease ([place]) 10/28/2019  . Absolute anemia 08/29/2019  . Coronary artery disease 05/01/2019  . Old myocardial infarction 05/01/2019  . Tinnitus 05/01/2019  . Other and unspecified peripheral vertigo 05/01/2019  . Low back pain with left-sided sciatica 02/13/2019  . Entrapment neuropathy of common peroneal nerve 07/20/2018  . Blurring of visual image 05/28/2018  . Glaucoma 02/24/2018  . Hx of CABG 02/24/2018  . Neuropathy 02/24/2018  . Vitamin D deficiency 02/24/2018  . Vitamin B12 deficiency 02/24/2018  . Orthopedic aftercare 10/08/2017  . S/P shoulder replacement, left 09/25/2017  . Tear of left rotator cuff  08/04/2017  . Osteoarthritis of left shoulder 08/04/2017  . SUI (stress urinary incontinence), male 06/15/2017  . Malignant neoplasm of prostate ([place]) 04/23/2017  . Chest pain 07/27/2016  . Gastroesophageal reflux disease without esophagitis 07/27/2016  . Hyperlipidemia 07/22/2015  . Osteoarthrosis involving lower leg 02/22/2014  . Current tear of medial cartilage or meniscus of knee 11/11/2013  . Insomnia 03/07/2013  . Coronary artery disease involving coronary bypass graft of native heart with unstable angina pectoris ([place]) 10/06/2012  . Coronary atherosclerosis 05/09/2009  . Essential hypertension 05/09/2009  . Benign neoplasm of colon 04/13/2008  . Asymptomatic varicose veins 06/23/2007  . Allergic rhinitis 12/28/2006    Past Surgical History:  Procedure Laterality Date  . COLONOSCOPY    . CORONARY ANGIOPLASTY WITH STENT PLACEMENT  2005   x9   . CORONARY ARTERY BYPASS GRAFT  2011  . CORONARY STENT PLACEMENT  07/2014  . ESOPHAGOGASTRODUODENOSCOPY    . ESOPHAGOGASTRODUODENOSCOPY (EGD) WITH ESOPHAGEAL DILATION  2018  . knee scope Bilateral   . LEG SURGERY Right 09/2018  . LIPOMA EXCISION N/A 09/24/2016   Procedure: EXCISION SUBCUTANEOUS LIPOMA ON UPPER BACK;  Surgeon: Donnie Mesa, MD;  Location: [place];  Service: General;  Laterality: N/A;  . Prostectomy  1995  . REVERSE SHOULDER ARTHROPLASTY Left 09/25/2017  . REVERSE SHOULDER ARTHROPLASTY Left 09/25/2017   Procedure: LEFT REVERSE SHOULDER ARTHROPLASTY; deltoid repair;  Surgeon: Netta Cedars, MD;  Location: [place];  Service: Orthopedics;  Laterality: Left;  . ROTATOR CUFF REPAIR Left 2001    Prior to Admission medications   Medication Sig Start Date End Date Taking? Authorizing Provider  acetaminophen (TYLENOL) 650 MG CR tablet Take 650 mg by mouth as needed for pain. 2 tablets in the morning and 2 tablets at bedtime    [provider]  amLODipine (NORVASC) 10 MG tablet Take 1 tablet (10 mg total) by mouth daily.  09/27/19   Adrian Prows, MD  aspirin EC 81 MG tablet Take 81 mg by mouth daily.    [provider]  azelastine (ASTELIN) 0.1 % nasal spray Place 1 spray into both nostrils 2 (two) times daily. Use in each nostril as directed    [provider]  azelastine (OPTIVAR) 0.05 % ophthalmic solution 1 drop 2 (two) times daily 03/31/18   [provider]  calcium carbonate (TUMS - DOSED IN MG ELEMENTAL CALCIUM) 500 MG chewable tablet Chew 2 tablets by mouth daily as needed for indigestion or heartburn.    [provider]  cetirizine (ZYRTEC) 10 MG tablet Take 10 mg by mouth daily.    [provider]  cholecalciferol (VITAMIN D) 1000 units tablet Take 1,000 Units by mouth daily.    [provider]  Cyanocobalamin (B-12) 1000 MCG/ML KIT Inject 1 mL as directed every 30 (thirty) days.    [provider]  cycloSPORINE (RESTASIS) 0.05 % ophthalmic emulsion Place 1 drop into both eyes 2 (two) times daily.    [provider]  Evolocumab (REPATHA SURECLICK) 161 MG/ML SOAJ Inject 140 mg into the skin every 14 (fourteen) days. 12/20/19   Adrian Prows, MD  Infant Care Products (BABY SHAMPOO EX) Rub into a wash cloth and rub into the eyes 3 to 4 times daily for dry eyes / eye build up    [provider]  latanoprost (XALATAN) 0.005 % ophthalmic solution Place 1 drop into both eyes at bedtime. 08/22/17   [provider]  lisinopril (PRINIVIL,ZESTRIL) 20 MG tablet Take 20 mg by mouth 2 (two) times daily.     [provider]  metoprolol tartrate (LOPRESSOR) 25 MG tablet Take 0.5 tablets (12.5 mg total) by mouth 2 (two) times daily. 09/27/19   Adrian Prows, MD  nitroGLYCERIN (NITROSTAT) 0.4 MG SL tablet Place 0.4 mg under the tongue every 5 (five) minutes as needed for chest pain.    [provider]  Olopatadine HCl 0.2 % SOLN Place 1 drop into both eyes daily.    [provider]  pantoprazole (PROTONIX) 40 MG tablet Take  40 mg by mouth every morning.     [provider]  Polyethyl Glycol-Propyl Glycol (SYSTANE OP) Apply 1 drop to eye 5 (five) times daily as needed (dry eyes).    [provider]  pregabalin (LYRICA) 50 MG capsule Take 50 mg by mouth in the morning and at bedtime. 10/03/19   [provider]  sucralfate (CARAFATE) 1 g tablet Take 1 g by mouth 2 (two) times daily.     [provider]  zolpidem (AMBIEN) 10 MG tablet Take 10 mg by mouth at bedtime.     [provider]    Allergies Atorvastatin, Rosuvastatin, Statins, Folic acid-vit W9-UEA V40, Hydrocodone, and Pravastatin  Family History  Problem Relation Age of Onset  . Alzheimer's disease Mother   . Stroke Father   . Parkinson's disease Sister   . Cancer Sister   . Obesity Sister   .  Diabetes Mellitus II Sister     Social History Social History   Tobacco Use  . Smoking status: Never Smoker  . Smokeless tobacco: Never Used  Vaping Use  . Vaping Use: Never used  Substance Use Topics  . Alcohol use: Yes    Alcohol/week: 7.0 standard drinks    Types: 7 Glasses of wine per week    Comment: wine with dinner  . Drug use: No    Review of Systems  Constitutional: No fever/chills Eyes: No visual changes. ENT: No sore throat. Cardiovascular: Denies chest pain. Respiratory: Denies shortness of breath. Gastrointestinal: Currently no abdominal pain.  No nausea, no vomiting.  No diarrhea.  No constipation. Genitourinary: Negative for dysuria. Musculoskeletal: Negative for back pain. Skin: Negative for rash. Neurological: Negative for focal weakness o  ____________________________________________   PHYSICAL EXAM:  VITAL SIGNS: ED Triage Vitals  Enc Vitals Group     BP 12/23/19 1142 (!) 142/61     Pulse Rate 12/23/19 1142 (!) 49     Resp 12/23/19 1142 20     Temp 12/23/19 1142 97.8 F (36.6 C)     Temp Source 12/23/19 1142 Oral     SpO2 12/23/19 1142 94 %     Weight 12/23/19 1143  199 lb (90.3 kg)     Height 12/23/19 1143 6' (1.829 m)     Head Circumference --      Peak Flow --      Pain Score 12/23/19 1149 0     Pain Loc --      Pain Edu? --      Excl. in [place]? --    Constitutional: Alert and oriented. Well appearing and in no acute distress. Eyes: Conjunctivae are normal. PERRL. EOMI. fundi look normal there is no nystagmus.  I cannot induce nystagmus.  Thrust testing is negative. Head: Atraumatic. Nose: No congestion/rhinnorhea. Mouth/Throat: Mucous membranes are moist.  Oropharynx non-erythematous. Neck: No stridor.  No tenderness on palpation Cardiovascular: Normal rate, regular rhythm. Grossly normal heart sounds.  Good peripheral circulation. Respiratory: Normal respiratory effort.  No retractions. Lungs CTAB. Gastrointestinal: Soft and nontender. No distention. No abdominal bruits. No CVA tenderness. Musculoskeletal: No lower extremity tenderness nor edema.  Neurologic:  Normal speech and language. No gross focal neurologic deficits are appreciated.  Cranial nerves II through XII are intact although visual fields were not checked.  Several were Beller finger-to-nose and rapid alternating movements and hands are normal.  Motor strength is 5/5 throughout.  Patient does not report any numbness  Skin:  Skin is warm, dry and intact. No rash noted. Psychiatric: Mood and affect are normal. Speech and behavior are normal.  ____________________________________________   LABS (all labs ordered are listed, but only abnormal results are displayed)  Labs Reviewed  APTT - Abnormal; Notable for the following components:      Result Value   aPTT <24 (*)    All other components within normal limits  COMPREHENSIVE METABOLIC PANEL - Abnormal; Notable for the following components:   Glucose, Bld 111 (*)    All other components within normal limits  PROTIME-INR  CBC  DIFFERENTIAL  TROPONIN I (HIGH SENSITIVITY)  TROPONIN I (HIGH SENSITIVITY)    ____________________________________________  EKG  EKG shows sinus bradycardia rate of 55 normal axis essentially normal EKG except the rate.  Repeat EKG rate of 50 otherwise similar. ____________________________________________  RADIOLOGY  ED MD interpretation patient's head CT read by radiology shows no acute findings.  MRI also read by radiology  shows no acute findings.  Official radiology report(s): CT HEAD WO CONTRAST  Result Date: 12/23/2019 CLINICAL DATA:  Sudden onset weakness and difficulty standing. Dizziness and vomiting. EXAM: CT HEAD WITHOUT CONTRAST TECHNIQUE: Contiguous axial images were obtained from the base of the skull through the vertex without intravenous contrast. COMPARISON:  06/23/2019 FINDINGS: Brain: Normal appearing cerebral hemispheres and posterior fossa structures. Normal size and position of the ventricles. No intracranial hemorrhage, mass lesion or CT evidence of acute infarction. Vascular: No hyperdense vessel or unexpected calcification. Skull: Normal. Negative for fracture or focal lesion. Sinuses/Orbits: Unremarkable. Other: None. IMPRESSION: Normal examination. Electronically Signed   By: Claudie Revering M.D.   On: 12/23/2019 12:34   MR BRAIN WO CONTRAST  Result Date: 12/23/2019 CLINICAL DATA:  Syncopal episode. Vertigo and weakness. Pulsatile noise left ear. EXAM: MRI HEAD WITHOUT CONTRAST TECHNIQUE: Multiplanar, multiecho pulse sequences of the brain and surrounding structures were obtained without intravenous contrast. COMPARISON:  Head CT same day FINDINGS: Brain: Diffusion imaging does not show any acute or subacute infarction. No abnormality is seen affecting the brainstem or cerebellum. Cerebral hemispheres show minimal small vessel changes of the deep white matter, less often present at this age. No large vessel territory insult. No mass lesion, hemorrhage, hydrocephalus or extra-axial collection. Vascular: Major vessels at the base of the brain show  flow. Skull and upper cervical spine: Negative Sinuses/Orbits: Clear/normal Other: No fluid in the middle ears or mastoids. IMPRESSION: No acute or reversible finding. No abnormality seen to explain the presenting symptoms. Normal examination, with exception of minimal small vessel change of the cerebral hemispheric white matter, less than often seen at this age. Electronically Signed   By: Nelson Chimes M.D.   On: 12/23/2019 15:33    ____________________________________________   PROCEDURES  Procedure(s) performed (including Critical Care):  Procedures   ____________________________________________   INITIAL IMPRESSION / ASSESSMENT AND PLAN / ED COURSE  Patient had to be moved to the hallway as we needed his room for an acutely ill patient.  He is doing well in the hallway seems to be much more awake and alert that he was originally.  He is not having any vomiting does not appear to be vertiginous.  Tests are negative I will let him go.  I will have him follow-up with his primary care doctor and return if he is worse.    ----------------------------------------- 4:53 PM on 12/23/2019 -----------------------------------------   As I am discharging him he tells me that the hearing is decreased in his 1 year by about 50%.  I will have him follow-up with ENT as the MRI does not show anything.  His ears are clear there is a little bit of wax in the left and I do not see any else.  Patient says he has had intermittent hearing loss with vertigo before.  The hearing loss has been worse the last couple days.  Vertigo has been coming and going for quite some time.  It may be that he has Mnire's disease.  I will have him follow-up with ENT.       ____________________________________________   FINAL CLINICAL IMPRESSION(S) / ED DIAGNOSES  Final diagnoses:  Vertigo     ED Discharge Orders    None       Note:  This document was prepared using Dragon voice recognition software and  may include unintentional dictation errors.    Nena Polio, MD 12/23/19 1647    Nena Polio, MD 12/23/19 1659

## 2020-01-02 ENCOUNTER — Telehealth: Payer: Self-pay | Admitting: Pharmacy Technician

## 2020-01-02 NOTE — Telephone Encounter (Signed)
Referral for food and housing assistance sent to [facility].  [facility]

## 2020-01-11 LAB — LIPID PANEL WITH LDL/HDL RATIO
Cholesterol, Total: 154 mg/dL (ref 100–199)
HDL: 47 mg/dL (ref >39)
LDL Chol Calc (NIH): 80 mg/dL (ref 0–99)
LDL/HDL Ratio: 1.7 ratio (ref 0.0–3.6)
Triglycerides: 159 mg/dL — ABNORMAL HIGH (ref 0–149)
VLDL Cholesterol Cal: 27 mg/dL (ref 5–40)

## 2020-01-13 ENCOUNTER — Ambulatory Visit: Payer: Medicare HMO

## 2020-01-16 LAB — HM AWV

## 2020-01-27 ENCOUNTER — Ambulatory Visit: Payer: Medicare HMO

## 2020-02-03 ENCOUNTER — Ambulatory Visit: Payer: Medicare HMO

## 2020-02-14 ENCOUNTER — Telehealth: Payer: Self-pay

## 2020-02-14 DIAGNOSIS — R55 Syncope and collapse: Secondary | ICD-10-CM

## 2020-02-14 NOTE — Telephone Encounter (Signed)
Patient called and left a message stating that he has been getting severely dizzy, nauseated, and his BP spikes to extremely high numbers and cannot stand nor walk and during an episode and it only happens 24hrs after his Repatha injection. Patient also stated that has passed out and thanks to a friend, she was able to call 911.   He stated that he has had 4 injections and an episode every time.   Patient cannot see very well anymore, so he cannot read information on Mychart, so I will have to give him a call back.

## 2020-02-15 ENCOUNTER — Other Ambulatory Visit: Payer: Self-pay

## 2020-02-15 NOTE — Telephone Encounter (Signed)
Patient is agreeable. Patient administers his own injection, so I advised him to inject as directed (Sat, Aug 14th 2021) and come in Mon, Aug 16th, 2021 to have monitor put on. Yes, please place orders.

## 2020-02-15 NOTE — Telephone Encounter (Signed)
I did not see an appointment for him for Monday so I had to forward this to you. Orders placed. Next time go ahead and set up appointment to avoid duplicate messages going back and forth

## 2020-02-15 NOTE — Telephone Encounter (Signed)
ICD-10-CM   1. Syncope and collapse  R55 LONG TERM MONITOR-LIVE TELEMETRY (3-14 DAYS)    Adrian Prows, MD, [facility] 02/15/2020, 3:50 PM Office: [phone]

## 2020-02-15 NOTE — Telephone Encounter (Signed)
Not sure it is related to [place]. He can wear a monitor for 1 week and take injection 2 days before and continue to  wear the monitor for additional 5 days. I can place orders

## 2020-02-22 ENCOUNTER — Ambulatory Visit: Payer: Medicare HMO

## 2020-02-27 ENCOUNTER — Ambulatory Visit: Payer: Medicare HMO

## 2020-02-27 DIAGNOSIS — R55 Syncope and collapse: Secondary | ICD-10-CM

## 2020-02-29 ENCOUNTER — Ambulatory Visit: Payer: Medicare HMO

## 2020-03-07 ENCOUNTER — Ambulatory Visit: Payer: Medicare HMO

## 2020-03-26 ENCOUNTER — Ambulatory Visit: Payer: Medicare HMO | Admitting: Cardiology

## 2020-04-10 ENCOUNTER — Telehealth: Payer: Self-pay

## 2020-04-10 NOTE — Telephone Encounter (Signed)
Patient called requesting you and only you to call back with results of his monitor and he DOES NOT DO MyChart. Wants a phone call and wants this result in detail. He was very upset and wouldn't even let me speak.

## 2020-04-10 NOTE — Telephone Encounter (Signed)
I discussed the findings with the patient, patient symptoms of acute onset severe dizziness and vertigo do not correlate with findings on the extended EKG monitoring.  Patient has also been diagnosed with BPV.  I reassured him.   Adrian Prows, MD, [facility] 04/10/2020, 12:31 PM Office: [phone]

## 2020-05-09 ENCOUNTER — Ambulatory Visit: Payer: Medicare HMO

## 2020-05-14 ENCOUNTER — Telehealth: Payer: Self-pay

## 2020-05-14 NOTE — Telephone Encounter (Signed)
Patient called in stating he will not be available to come into office tomorrow at 2 pm for 20-30 Knee high Compression fitting. I advised patient he is ok to come in earlier and I will measure him. He voiced his understanding.

## 2020-05-15 ENCOUNTER — Ambulatory Visit: Payer: Medicare HMO

## 2020-05-15 ENCOUNTER — Other Ambulatory Visit: Payer: Self-pay

## 2020-05-15 DIAGNOSIS — I8393 Asymptomatic varicose veins of bilateral lower extremities: Secondary | ICD-10-CM

## 2020-05-24 ENCOUNTER — Other Ambulatory Visit: Payer: Self-pay | Admitting: Otolaryngology

## 2020-05-24 DIAGNOSIS — J329 Chronic sinusitis, unspecified: Secondary | ICD-10-CM

## 2020-06-01 ENCOUNTER — Ambulatory Visit: Payer: Medicare HMO | Admitting: Cardiology

## 2020-06-06 ENCOUNTER — Ambulatory Visit: Payer: Medicare HMO | Admitting: Neurology

## 2020-06-14 ENCOUNTER — Ambulatory Visit
Admission: RE | Admit: 2020-06-14 | Discharge: 2020-06-14 | Disposition: A | Discharge status: Home / Self Care | Payer: Medicare HMO | Source: Ambulatory Visit | Attending: Otolaryngology | Admitting: Otolaryngology

## 2020-06-14 DIAGNOSIS — J329 Chronic sinusitis, unspecified: Secondary | ICD-10-CM

## 2020-06-28 ENCOUNTER — Other Ambulatory Visit: Payer: Self-pay

## 2020-06-28 ENCOUNTER — Ambulatory Visit: Payer: Medicare HMO | Admitting: Cardiology

## 2020-06-28 ENCOUNTER — Encounter: Payer: Self-pay | Admitting: Cardiology

## 2020-06-28 VITALS — BP 136/62 | HR 68 | Resp 16 | Ht 72.0 in | Wt 201.2 lb

## 2020-06-28 DIAGNOSIS — I951 Orthostatic hypotension: Secondary | ICD-10-CM

## 2020-06-28 DIAGNOSIS — Z789 Other specified health status: Secondary | ICD-10-CM

## 2020-06-28 DIAGNOSIS — I10 Essential (primary) hypertension: Secondary | ICD-10-CM

## 2020-06-28 DIAGNOSIS — R0989 Other specified symptoms and signs involving the circulatory and respiratory systems: Secondary | ICD-10-CM

## 2020-06-28 DIAGNOSIS — Z951 Presence of aortocoronary bypass graft: Secondary | ICD-10-CM

## 2020-06-28 DIAGNOSIS — E7800 Pure hypercholesterolemia, unspecified: Secondary | ICD-10-CM

## 2020-06-28 DIAGNOSIS — I2510 Atherosclerotic heart disease of native coronary artery without angina pectoris: Secondary | ICD-10-CM

## 2020-06-28 NOTE — Progress Notes (Signed)
Primary Physician/Referring:  Perrin Maltese, MD  Patient ID: [name] [name], male    DOB: March 09, 1936, 84 y.o.   MRN: [id number]  Chief Complaint  Patient presents with  . Coronary Artery Disease  . Hyperlipidemia  . Follow-up    6 month   HPI:    [name] [name]  is a 84 y.o. Caucasian male with known CAD, hyperlipidemia, hypertension and esophageal stricture S/P dilatation remotely, multiple coronary interventions and eventually underwent CABG 2 in 2010. For NSTEMI, he has had SVG to D2 stent in 2010 and again in January 2017, patent stents by angiography in January 2018.  He has had 2 coronary intervention to SVG to D2 with placement of the proximal stent in 2010 and mid segment stent in January 2017 when he presented with NSTEMI  in [place].  He is presently doing well, has not had any recurrence of angina although he continues to have episodes of BPH.  His activity is limited due to arthritis and neuropathy.  He is accompanied by his girlfriend [place].  He is tolerating Repatha and all his cardiac medications he has had difficulty in obtaining Praluent approved by his insurance, now off of Praluent for the past 4 weeks.  We had got prior approval for Repatha, but he wanted to further discuss with me.  Tolerating all his medications well.  His main complaints are chronic back pain, severe spinal stenosis and bilateral loss of coordination and loss of sensation in the feet.  He has been trying to avoid surgery due to his cardiac issues.  He has severe peripheral neuropathy, complete loss of sensation in both feet, and severe statin intolerance with rhabdomyolysis history in the past.  He has noticed occasional episodes of marked dizziness when he stands up.  Past Medical History:  Diagnosis Date  . Arthritis   . Cancer [facility]) 1995   prostate ca   . Coronary artery disease   . Esophageal stricture 08/2016  . GERD (gastroesophageal reflux disease)   . Glaucoma   . History of  hiatal hernia   . Hypertension   . MI (mitral incompetence)   . Peripheral neuropathy   . Peripheral vascular disease ([place])   . Vertigo    Past Surgical History:  Procedure Laterality Date  . COLONOSCOPY    . CORONARY ANGIOPLASTY WITH STENT PLACEMENT  2005   x9   . CORONARY ARTERY BYPASS GRAFT  2011  . CORONARY STENT PLACEMENT  07/2014  . ESOPHAGOGASTRODUODENOSCOPY    . ESOPHAGOGASTRODUODENOSCOPY (EGD) WITH ESOPHAGEAL DILATION  2018  . knee scope Bilateral   . LEG SURGERY Right 09/2018  . LIPOMA EXCISION N/A 09/24/2016   Procedure: EXCISION SUBCUTANEOUS LIPOMA ON UPPER BACK;  Surgeon: Donnie Mesa, MD;  Location: [facility];  Service: General;  Laterality: N/A;  . Prostectomy  1995  . REVERSE SHOULDER ARTHROPLASTY Left 09/25/2017  . REVERSE SHOULDER ARTHROPLASTY Left 09/25/2017   Procedure: LEFT REVERSE SHOULDER ARTHROPLASTY; deltoid repair;  Surgeon: Netta Cedars, MD;  Location: [place];  Service: Orthopedics;  Laterality: Left;  . ROTATOR CUFF REPAIR Left 2001   Family History  Problem Relation Age of Onset  . Alzheimer's disease Mother   . Stroke Father   . Parkinson's disease Sister   . Cancer Sister   . Obesity Sister   . Diabetes Mellitus II Sister    Social History   Tobacco Use  . Smoking status: Never Smoker  . Smokeless tobacco: Never Used  Substance Use Topics  .  Alcohol use: Yes    Alcohol/week: 7.0 standard drinks    Types: 7 Glasses of wine per week    Comment: wine with dinner    Marital Status: Single ROS  Review of Systems  Cardiovascular: Positive for leg swelling. Negative for chest pain and dyspnea on exertion.  Musculoskeletal: Positive for back pain.  Gastrointestinal: Negative for melena.  Neurological: Positive for disturbances in coordination, dizziness, numbness and paresthesias.   Objective  Blood pressure 136/62, pulse 68, resp. rate 16, height 6' (1.829 m), weight 201 lb 3.2 oz (91.3 kg), SpO2 97 %.  Vitals with BMI 06/28/2020 12/23/2019  12/23/2019  Height _0  - -  Weight 201 lbs 3 oz - -  BMI 03.47 - -  Systolic 425 956 387  Diastolic 62 53 61  Pulse 68 55 49    Orthostatic VS for the past 72 hrs (Last 3 readings):  Patient Position BP Location Cuff Size  06/28/20 1107 Sitting Left Arm Large     Physical Exam Constitutional:      Appearance: He is well-developed.  Cardiovascular:     Rate and Rhythm: Normal rate and regular rhythm.     Pulses: Intact distal pulses.          Carotid pulses are on the right side with bruit and on the left side with bruit.    Heart sounds: Normal heart sounds. No murmur heard. No gallop.      Comments: No leg edema, no JVD. Pulmonary:     Effort: Pulmonary effort is normal.     Breath sounds: Normal breath sounds.  Abdominal:     General: Bowel sounds are normal.     Palpations: Abdomen is soft.    Laboratory examination:   Recent Labs    07/03/19 2345 12/23/19 1142  NA 137 135  K 4.1 4.0  CL 101 102  CO2 28 26  GLUCOSE 101* 111*  BUN 14 16  CREATININE 0.90 0.91  CALCIUM 8.9 8.9  GFRNONAA >60 >60  GFRAA >60 >60   CrCl cannot be calculated (Patient's most recent lab result is older than the maximum 21 days allowed.).  CMP Latest Ref Rng & Units 12/23/2019 07/03/2019 09/16/2017  Glucose 70 - 99 mg/dL 111(H) 101(H) 121(H)  BUN 8 - 23 mg/dL _1 Creatinine 0.61 - 1.24 mg/dL 0.91 0.90 0.84  Sodium 135 - 145 mmol/L 135 137 137  Potassium 3.5 - 5.1 mmol/L 4.0 4.1 4.2  Chloride 98 - 111 mmol/L 102 101 104  CO2 22 - 32 mmol/L 26 28 21(L)  Calcium 8.9 - 10.3 mg/dL 8.9 8.9 9.0  Total Protein 6.5 - 8.1 g/dL 6.8 6.6 -  Total Bilirubin 0.3 - 1.2 mg/dL 0.8 0.7 -  Alkaline Phos 38 - 126 U/L 44 44 -  AST 15 - 41 U/L 16 12(L) -  ALT 0 - 44 U/L 19 15 -   CBC Latest Ref Rng & Units 12/23/2019 07/03/2019 09/16/2017  WBC 4.0 - 10.5 K/uL 8.1 4.3 5.0  Hemoglobin 13.0 - 17.0 g/dL 13.2 12.7(L) 13.7  Hematocrit 39.0 - 52.0 % 40.0 38.3(L) 40.8  Platelets 150 - 400 K/uL 182 166  156   Lipid Panel Recent Labs    01/10/20 0901  CHOL 154  TRIG 159*  LDLCALC 80  HDL 47    External labs  09/12/2019:  Hb 13.4/15.6, platelets 178, normal indicis. Serum glucose 96 mg, BMP normal N, EGFR >60 mL. A1c 5.4%.  TSH normal. Total  cholesterol 164, triglycerides 171, HDL 47, LDL 88. Vitamin D 36.4.  BNP 93.0 08/13/2019   Hb 12.8/HCT 37.3, platelets 144.  08/13/2019    Medications and allergies   Allergies  Allergen Reactions  . Atorvastatin Other (See Comments)    Joint pain  . Rosuvastatin Other (See Comments)    Joint pain  . Statins Other (See Comments)    Leg pain   . Folic Acid-Vit S8-NIO E70 Anxiety, Itching and Nausea Only  . Hydrocodone Rash  . Pravastatin Nausea Only    Current Outpatient Medications on File Prior to Visit  Medication Sig Dispense Refill  . acetaminophen (TYLENOL) 650 MG CR tablet Take 650 mg by mouth as needed for pain. 2 tablets in the morning and 2 tablets at bedtime    . amLODipine (NORVASC) 10 MG tablet Take 1 tablet (10 mg total) by mouth daily. 90 tablet 3  . aspirin EC 81 MG tablet Take 81 mg by mouth daily.    Marland Kitchen azelastine (ASTELIN) 0.1 % nasal spray Place 1 spray into both nostrils 2 (two) times daily. Use in each nostril as directed    . azelastine (OPTIVAR) 0.05 % ophthalmic solution 1 drop 2 (two) times daily    . calcium carbonate (TUMS - DOSED IN MG ELEMENTAL CALCIUM) 500 MG chewable tablet Chew 2 tablets by mouth daily as needed for indigestion or heartburn.    . cholecalciferol (VITAMIN D) 1000 units tablet Take 1,000 Units by mouth daily.    . Cyanocobalamin (B-12) 1000 MCG/ML KIT Inject 1 mL as directed every 30 (thirty) days.    . cycloSPORINE (RESTASIS) 0.05 % ophthalmic emulsion Place 1 drop into both eyes 2 (two) times daily.    . Evolocumab (REPATHA SURECLICK) 350 MG/ML SOAJ Inject 140 mg into the skin every 14 (fourteen) days. 2 pen 6  . fluticasone (FLONASE) 50 MCG/ACT nasal spray Place 1-2 sprays into both  nostrils as needed.    . Infant Care Products (BABY SHAMPOO EX) Rub into a wash cloth and rub into the eyes 3 to 4 times daily for dry eyes / eye build up    . latanoprost (XALATAN) 0.005 % ophthalmic solution Place 1 drop into both eyes at bedtime.  0  . lisinopril (PRINIVIL,ZESTRIL) 20 MG tablet Take 20 mg by mouth 2 (two) times daily.     . meclizine (ANTIVERT) 12.5 MG tablet Take 1 tablet by mouth daily.    . metoprolol tartrate (LOPRESSOR) 25 MG tablet Take 0.5 tablets (12.5 mg total) by mouth 2 (two) times daily. 180 tablet 3  . nitroGLYCERIN (NITROSTAT) 0.4 MG SL tablet Place 0.4 mg under the tongue every 5 (five) minutes as needed for chest pain.    Marland Kitchen Olopatadine HCl 0.2 % SOLN Place 1 drop into both eyes daily.    . pantoprazole (PROTONIX) 40 MG tablet Take 40 mg by mouth every morning.     Vladimir Faster Glycol-Propyl Glycol (SYSTANE OP) Apply 1 drop to eye 5 (five) times daily as needed (dry eyes).    . pregabalin (LYRICA) 50 MG capsule Take 50 mg by mouth in the morning and at bedtime.    Marland Kitchen zolpidem (AMBIEN) 10 MG tablet Take 10 mg by mouth at bedtime.    Marland Kitchen levocetirizine (XYZAL) 5 MG tablet      No current facility-administered medications on file prior to visit.     Radiology:  No results found.   Cardiac Studies:   Coronary Angiogram  07/24/2015: ( repeat cath  07/29/2016:) Prox LAD: 100% stenosis, LIMA to LAD graft patent, Mid RCA: 20% stenosis; SVG to D2: 100% stenosis in the middle third of the graft, s/p stenting with 3.5 x 33 Promus premier DES. Stent in the proximal graft, 3.5x33 mm Xience DES placed March 2014 widely patent. Diffuse native vessel disease. Codominant system. Normal LVEF.    Echocardiogram 02/06/2016: Left ventricle cavity is normal in size. Mild concentric hypertrophy of the left ventricle.  Mild basal septal hypertrophy. Normal global wall motion. Normal diastolic filling pattern. Calculated EF 69%. Left atrial cavity is moderately dilated at 4.7 cm. Mild  (Grade I) aortic regurgitation. Mild to moderate mitral regurgitation. Mild to moderate tricuspid regurgitation. Borderline elevation in PA pressure at 30 mm Hg and suggests mild pulmonary hypertension. IVC is dilated with respiratory variation. This may suggests elevated right heart pressure.  Zio Patch Extended out patient EKG monitoring 7 days 02/27/2020 through 03/05/2020:  There were 47 patient triggered activities, 7 dairy events correlating normal sinus rhythm, PACs and PVCs. Predominant rhythm is normal sinus rhythm.  There was 1 episode of 4 beat NSVT at 6:25 AM.  There are brief atrial tachycardia, fastest 6 beats at 152 bpm and longest 20 beats at 95 bpm which was symptomatic. No atrial fibrillation, no heart block.  Rare PVC noted.   EKG:   EKG 06/28/2020: Normal sinus rhythm scratch that sinus rhythm with first-degree AV block at rate of 60 bpm, left atrial enlargement, inferior infarct old.  Poor R wave progression, cannot exclude anteroseptal infarct old.  No evidence of ischemia.  No significant change from 12/26/2019.   Assessment     ICD-10-CM   1. Coronary artery disease involving native coronary artery of native heart without angina pectoris  I25.10 EKG 12-Lead    PCV ECHOCARDIOGRAM COMPLETE  2. Hx of CABG 2010: LIMA to LAD; SVG to D2  Z95.1   3. Hypercholesteremia  E78.00 Lipid Panel With LDL/HDL Ratio  4. Orthostatic hypotension  I95.1   5. Statin intolerance, Rhabdomyolysis  Z78.9   6. Bilateral carotid bruits  R09.89 PCV CAROTID DUPLEX (BILATERAL)  7. Essential hypertension  O03 COMPLETE METABOLIC PANEL WITH GFR    CBC    TSH     No orders of the defined types were placed in this encounter.  Medications Discontinued During This Encounter  Medication Reason  . cetirizine (ZYRTEC) 10 MG tablet No longer needed (for PRN medications)  . sucralfate (CARAFATE) 1 g tablet Patient Preference    Recommendations:   [place] W. Lenderman  is a 84 y.o. Caucasian male with  known CAD, hyperlipidemia, hypertension and esophageal stricture S/P dilatation remotely, multiple coronary interventions and eventually underwent CABG 2 in 2010. For NSTEMI, he has had SVG to D2 stent in 2010 and again in January 2017, patent stents by angiography in January 2018.  He has severe peripheral neuropathy, chronic back pain and statin intolerance.  He is presently tolerating Repatha, needs lipid profile testing.  With regard to hypertension, blood pressure is well controlled.  He has faint bilateral carotid bruit right is more prominent than the left that is new, needs carotid artery duplex.  His last cardiac catheterization was 3 years ago and he had patent vessels.  Do not think he needs a stress test.  He has not had any angina pectoris.  However I would like to know his LVEF as it has been 5 years since his last echocardiogram.  I did not make any changes to his medications today.  Along with lipid profile testing, will obtain TSH, CMP and CBC as well for therapeutic drug monitoring and disease monitoring.  I will see him back in 6 months for follow-up unless tests are markedly  abnormal.   Adrian Prows, MD, [facility] 06/28/2020, 12:03 PM Office: [phone] Pager: [phone]

## 2020-06-29 ENCOUNTER — Other Ambulatory Visit: Payer: Self-pay | Admitting: Cardiology

## 2020-06-30 LAB — CBC
Hematocrit: 39.5 % (ref 37.5–51.0)
Hemoglobin: 13.1 g/dL (ref 13.0–17.7)
MCH: 29.4 pg (ref 26.6–33.0)
MCHC: 33.2 g/dL (ref 31.5–35.7)
MCV: 89 fL (ref 79–97)
Platelets: 172 10*3/uL (ref 150–450)
RBC: 4.45 x10E6/uL (ref 4.14–5.80)
RDW: 12.3 % (ref 11.6–15.4)
WBC: 5.2 10*3/uL (ref 3.4–10.8)

## 2020-06-30 LAB — LIPID PANEL WITH LDL/HDL RATIO
Cholesterol, Total: 147 mg/dL (ref 100–199)
HDL: 40 mg/dL (ref >39)
LDL Chol Calc (NIH): 82 mg/dL (ref 0–99)
LDL/HDL Ratio: 2.1 ratio (ref 0.0–3.6)
Triglycerides: 143 mg/dL (ref 0–149)
VLDL Cholesterol Cal: 25 mg/dL (ref 5–40)

## 2020-06-30 LAB — CMP14+EGFR
ALT: 11 IU/L (ref 0–44)
AST: 9 IU/L (ref 0–40)
Albumin/Globulin Ratio: 2.1 (ref 1.2–2.2)
Albumin: 4.1 g/dL (ref 3.6–4.6)
Alkaline Phosphatase: 52 IU/L (ref 44–121)
BUN/Creatinine Ratio: 26 — ABNORMAL HIGH (ref 10–24)
BUN: 24 mg/dL (ref 8–27)
Bilirubin Total: 0.4 mg/dL (ref 0.0–1.2)
CO2: 23 mmol/L (ref 20–29)
Calcium: 9.1 mg/dL (ref 8.6–10.2)
Chloride: 105 mmol/L (ref 96–106)
Creatinine, Ser: 0.91 mg/dL (ref 0.76–1.27)
GFR calc Af Amer: 90 mL/min/{1.73_m2} (ref >59)
GFR calc non Af Amer: 78 mL/min/{1.73_m2} (ref >59)
Globulin, Total: 2 g/dL (ref 1.5–4.5)
Glucose: 85 mg/dL (ref 65–99)
Potassium: 4.4 mmol/L (ref 3.5–5.2)
Sodium: 140 mmol/L (ref 134–144)
Total Protein: 6.1 g/dL (ref 6.0–8.5)

## 2020-06-30 LAB — TSH: TSH: 1.38 u[IU]/mL (ref 0.450–4.500)

## 2020-07-05 ENCOUNTER — Other Ambulatory Visit: Payer: Self-pay | Admitting: Cardiology

## 2020-07-06 ENCOUNTER — Encounter: Payer: Self-pay | Admitting: Emergency Medicine

## 2020-07-06 ENCOUNTER — Other Ambulatory Visit: Payer: Self-pay

## 2020-07-06 ENCOUNTER — Ambulatory Visit (INDEPENDENT_AMBULATORY_CARE_PROVIDER_SITE_OTHER)
Admission: EM | Admit: 2020-07-06 | Discharge: 2020-07-06 | Disposition: A | Discharge status: Home / Self Care | Payer: Medicare HMO | Source: Home / Self Care | Attending: Sports Medicine | Admitting: Sports Medicine

## 2020-07-06 ENCOUNTER — Ambulatory Visit: Payer: Medicare HMO | Attending: Sports Medicine

## 2020-07-06 ENCOUNTER — Telehealth: Payer: Self-pay | Admitting: Sports Medicine

## 2020-07-06 DIAGNOSIS — R1032 Left lower quadrant pain: Secondary | ICD-10-CM

## 2020-07-06 DIAGNOSIS — R112 Nausea with vomiting, unspecified: Secondary | ICD-10-CM | POA: Insufficient documentation

## 2020-07-06 DIAGNOSIS — R197 Diarrhea, unspecified: Secondary | ICD-10-CM | POA: Diagnosis present

## 2020-07-06 DIAGNOSIS — K5732 Diverticulitis of large intestine without perforation or abscess without bleeding: Secondary | ICD-10-CM

## 2020-07-06 LAB — CBC WITH DIFFERENTIAL/PLATELET
Abs Immature Granulocytes: 0.05 10*3/uL (ref 0.00–0.07)
Basophils Absolute: 0 10*3/uL (ref 0.0–0.1)
Basophils Relative: 1 %
Eosinophils Absolute: 0.3 10*3/uL (ref 0.0–0.5)
Eosinophils Relative: 4 %
HCT: 39.1 % (ref 39.0–52.0)
Hemoglobin: 13 g/dL (ref 13.0–17.0)
Immature Granulocytes: 1 %
Lymphocytes Relative: 19 %
Lymphs Abs: 1.4 10*3/uL (ref 0.7–4.0)
MCH: 29.6 pg (ref 26.0–34.0)
MCHC: 33.2 g/dL (ref 30.0–36.0)
MCV: 89.1 fL (ref 80.0–100.0)
Monocytes Absolute: 0.6 10*3/uL (ref 0.1–1.0)
Monocytes Relative: 7 %
Neutro Abs: 5.1 10*3/uL (ref 1.7–7.7)
Neutrophils Relative %: 68 %
Platelets: 188 10*3/uL (ref 150–400)
RBC: 4.39 MIL/uL (ref 4.22–5.81)
RDW: 12.2 % (ref 11.5–15.5)
WBC: 7.4 10*3/uL (ref 4.0–10.5)
nRBC: 0 % (ref 0.0–0.2)

## 2020-07-06 LAB — COMPREHENSIVE METABOLIC PANEL
ALT: 15 U/L (ref 0–44)
AST: 12 U/L — ABNORMAL LOW (ref 15–41)
Albumin: 3.8 g/dL (ref 3.5–5.0)
Alkaline Phosphatase: 50 U/L (ref 38–126)
Anion gap: 7 (ref 5–15)
BUN: 16 mg/dL (ref 8–23)
CO2: 27 mmol/L (ref 22–32)
Calcium: 8.8 mg/dL — ABNORMAL LOW (ref 8.9–10.3)
Chloride: 103 mmol/L (ref 98–111)
Creatinine, Ser: 0.92 mg/dL (ref 0.61–1.24)
GFR, Estimated: >60 mL/min (ref >60)
Glucose, Bld: 94 mg/dL (ref 70–99)
Potassium: 4.2 mmol/L (ref 3.5–5.1)
Sodium: 137 mmol/L (ref 135–145)
Total Bilirubin: 0.6 mg/dL (ref 0.3–1.2)
Total Protein: 7.3 g/dL (ref 6.5–8.1)

## 2020-07-06 LAB — URINALYSIS, COMPLETE (UACMP) WITH MICROSCOPIC
Bilirubin Urine: NEGATIVE
Glucose, UA: NEGATIVE mg/dL
Hgb urine dipstick: NEGATIVE
Leukocytes,Ua: NEGATIVE
Nitrite: NEGATIVE
Protein, ur: NEGATIVE mg/dL
RBC / HPF: NONE SEEN RBC/hpf (ref 0–5)
Specific Gravity, Urine: 1.02 (ref 1.005–1.030)
pH: 5.5 (ref 5.0–8.0)

## 2020-07-06 LAB — LIPASE, BLOOD: Lipase: 22 U/L (ref 11–51)

## 2020-07-06 LAB — SEDIMENTATION RATE: Sed Rate: 47 mm/hr — ABNORMAL HIGH (ref 0–20)

## 2020-07-06 LAB — AMYLASE: Amylase: 36 U/L (ref 28–100)

## 2020-07-06 LAB — C-REACTIVE PROTEIN: CRP: 6.8 mg/dL — ABNORMAL HIGH (ref <1.0)

## 2020-07-06 MED ORDER — METRONIDAZOLE 500 MG PO TABS: 500.0000 mg | ORAL_TABLET | Freq: Three times a day (TID) | ORAL | 0 refills | Status: DC

## 2020-07-06 MED ORDER — CIPROFLOXACIN HCL 500 MG PO TABS: 500.0000 mg | ORAL_TABLET | Freq: Two times a day (BID) | ORAL | 0 refills | Status: DC

## 2020-07-06 MED ORDER — IOHEXOL 300 MG/ML  SOLN
100.0000 mL | Freq: Once | INTRAMUSCULAR | Status: AC | PRN
  Administered 2020-07-06: 100 mL via INTRAVENOUS

## 2020-07-06 NOTE — Discharge Instructions (Addendum)
Your labs and urine that were available are reassuring.  There are still several labs that have been sent to the hospital.  If there is anything abnormal on those labs we will contact you.  Please go directly to the emergency room at [facility].  When you check can let them know that you are here for a CT scan and that there is an order in the computer.  You do not need to see anyone in the emergency room.  When your scan is completed they will call me directly.  Please wait in the CT department and I will speak to you about your results and what to do next.

## 2020-07-06 NOTE — ED Provider Notes (Signed)
[facility]    CSN: [id number] Arrival date & time: 07/06/20  3500      History   Chief Complaint Chief Complaint  Patient presents with  . Abdominal Pain    Left sided    HPI [name] [name] is a 84 y.o. male.   Patient is a pleasant 84 year old male who presents for evaluation of 3 days of left lower quadrant abdominal pain.  It started with some cramping.  He has had some diarrhea as well.  No blood or mucus in the stool.  He did have one episode of emesis on the first day when he had the abdominal pain but that is stopped.  He does have a history of diverticulitis and spent a night in the emergency room about 10 years ago in [place].  No chronic abdominal issues.  He has no history of colon cancer.  He has been seen by GI every 6 months.  No fever shakes or chills.  The pain has migrated a little bit into the central aspect of his abdomen and a little superior on the left side.  He does have a history of chronic falls and uses a cane to assist with ambulation.  He does have a history of a two-vessel CABG and is followed by a cardiologist.  He comes in today for initial evaluation.  No red flag signs or symptoms.     Past Medical History:  Diagnosis Date  . Arthritis   . Cancer [facility]) 1995   prostate ca   . Coronary artery disease   . Esophageal stricture 08/2016  . GERD (gastroesophageal reflux disease)   . Glaucoma   . History of hiatal hernia   . Hypertension   . MI (mitral incompetence)   . Peripheral neuropathy   . Peripheral vascular disease ([place])   . Vertigo     Patient Active Problem List   Diagnosis Date Noted  . Exocrine pancreatic insufficiency 10/28/2019  . Peripheral vascular disease ([place]) 10/28/2019  . Absolute anemia 08/29/2019  . Coronary artery disease 05/01/2019  . Old myocardial infarction 05/01/2019  . Tinnitus 05/01/2019  . Other and unspecified peripheral vertigo 05/01/2019  . Low back pain with left-sided sciatica 02/13/2019   . Entrapment neuropathy of common peroneal nerve 07/20/2018  . Blurring of visual image 05/28/2018  . Glaucoma 02/24/2018  . Hx of CABG 02/24/2018  . Neuropathy 02/24/2018  . Vitamin D deficiency 02/24/2018  . Vitamin B12 deficiency 02/24/2018  . Orthopedic aftercare 10/08/2017  . S/P shoulder replacement, left 09/25/2017  . Tear of left rotator cuff 08/04/2017  . Osteoarthritis of left shoulder 08/04/2017  . SUI (stress urinary incontinence), male 06/15/2017  . Malignant neoplasm of prostate ([place]) 04/23/2017  . Chest pain 07/27/2016  . Gastroesophageal reflux disease without esophagitis 07/27/2016  . Hyperlipidemia 07/22/2015  . Osteoarthrosis involving lower leg 02/22/2014  . Current tear of medial cartilage or meniscus of knee 11/11/2013  . Insomnia 03/07/2013  . Coronary artery disease involving coronary bypass graft of native heart with unstable angina pectoris ([place]) 10/06/2012  . Coronary atherosclerosis 05/09/2009  . Essential hypertension 05/09/2009  . Benign neoplasm of colon 04/13/2008  . Asymptomatic varicose veins 06/23/2007  . Allergic rhinitis 12/28/2006    Past Surgical History:  Procedure Laterality Date  . COLONOSCOPY    . CORONARY ANGIOPLASTY WITH STENT PLACEMENT  2005   x9   . CORONARY ARTERY BYPASS GRAFT  2011  . CORONARY STENT PLACEMENT  07/2014  . ESOPHAGOGASTRODUODENOSCOPY    .  ESOPHAGOGASTRODUODENOSCOPY (EGD) WITH ESOPHAGEAL DILATION  2018  . knee scope Bilateral   . LEG SURGERY Right 09/2018  . LIPOMA EXCISION N/A 09/24/2016   Procedure: EXCISION SUBCUTANEOUS LIPOMA ON UPPER BACK;  Surgeon: Donnie Mesa, MD;  Location: [facility];  Service: General;  Laterality: N/A;  . Prostectomy  1995  . REVERSE SHOULDER ARTHROPLASTY Left 09/25/2017  . REVERSE SHOULDER ARTHROPLASTY Left 09/25/2017   Procedure: LEFT REVERSE SHOULDER ARTHROPLASTY; deltoid repair;  Surgeon: Netta Cedars, MD;  Location: [facility];  Service: Orthopedics;  Laterality: Left;  . ROTATOR CUFF  REPAIR Left 2001       Home Medications    Prior to Admission medications   Medication Sig Start Date End Date Taking? Authorizing Provider  acetaminophen (TYLENOL) 650 MG CR tablet Take 650 mg by mouth as needed for pain. 2 tablets in the morning and 2 tablets at bedtime   Yes [provider]  amLODipine (NORVASC) 10 MG tablet Take 1 tablet (10 mg total) by mouth daily. 09/27/19  Yes Adrian Prows, MD  aspirin EC 81 MG tablet Take 81 mg by mouth daily.   Yes [provider]  azelastine (ASTELIN) 0.1 % nasal spray Place 1 spray into both nostrils 2 (two) times daily. Use in each nostril as directed   Yes [provider]  calcium carbonate (TUMS - DOSED IN MG ELEMENTAL CALCIUM) 500 MG chewable tablet Chew 2 tablets by mouth daily as needed for indigestion or heartburn.   Yes [provider]  cholecalciferol (VITAMIN D) 1000 units tablet Take 1,000 Units by mouth daily.   Yes [provider]  Cyanocobalamin (B-12) 1000 MCG/ML KIT Inject 1 mL as directed every 30 (thirty) days.   Yes [provider]  fluticasone (FLONASE) 50 MCG/ACT nasal spray Place 1-2 sprays into both nostrils as needed. 04/18/20  Yes [provider]  latanoprost (XALATAN) 0.005 % ophthalmic solution Place 1 drop into both eyes at bedtime. 08/22/17  Yes [provider]  levocetirizine (XYZAL) 5 MG tablet  05/03/20  Yes [provider]  lisinopril (PRINIVIL,ZESTRIL) 20 MG tablet Take 20 mg by mouth 2 (two) times daily.    Yes [provider]  metoprolol tartrate (LOPRESSOR) 25 MG tablet Take 0.5 tablets (12.5 mg total) by mouth 2 (two) times daily. 09/27/19  Yes Adrian Prows, MD  pantoprazole (PROTONIX) 40 MG tablet Take 40 mg by mouth every morning.    Yes [provider]  pregabalin (LYRICA) 50 MG capsule Take 50 mg by mouth in the morning and at bedtime. 10/03/19  Yes [provider]  zolpidem (AMBIEN) 10 MG tablet Take 10 mg by  mouth at bedtime.   Yes [provider]  azelastine (OPTIVAR) 0.05 % ophthalmic solution 1 drop 2 (two) times daily 03/31/18   [provider]  ciprofloxacin (CIPRO) 500 MG tablet Take 1 tablet (500 mg total) by mouth 2 (two) times daily. 07/07/20   Armbruster, Carlota Raspberry, MD  cycloSPORINE (RESTASIS) 0.05 % ophthalmic emulsion Place 1 drop into both eyes 2 (two) times daily.    [provider]  Infant Care Products (BABY SHAMPOO EX) Rub into a wash cloth and rub into the eyes 3 to 4 times daily for dry eyes / eye build up    [provider]  meclizine (ANTIVERT) 12.5 MG tablet Take 1 tablet by mouth daily. 03/24/20   [provider]  metroNIDAZOLE (FLAGYL) 500 MG tablet Take 1 tablet (500 mg total) by mouth 3 (three) times  daily. 07/07/20   Armbruster, Carlota Raspberry, MD  nitroGLYCERIN (NITROSTAT) 0.4 MG SL tablet Place 0.4 mg under the tongue every 5 (five) minutes as needed for chest pain.    [provider]  Olopatadine HCl 0.2 % SOLN Place 1 drop into both eyes daily.    [provider]  Polyethyl Glycol-Propyl Glycol (SYSTANE OP) Apply 1 drop to eye 5 (five) times daily as needed (dry eyes).    [provider]  [place] 453 MG/ML SOAJ ADMINISTER 1 ML UNDER THE SKIN EVERY 14 DAYS 07/05/20   Adrian Prows, MD    Family History Family History  Problem Relation Age of Onset  . Alzheimer's disease Mother   . Stroke Father   . Parkinson's disease Sister   . Cancer Sister   . Obesity Sister   . Diabetes Mellitus II Sister     Social History Social History   Tobacco Use  . Smoking status: Never Smoker  . Smokeless tobacco: Never Used  Vaping Use  . Vaping Use: Never used  Substance Use Topics  . Alcohol use: Yes    Alcohol/week: 7.0 standard drinks    Types: 7 Glasses of wine per week    Comment: wine with dinner  . Drug use: No     Allergies   Atorvastatin, Rosuvastatin, Statins, Folic acid-vit M4-WOE H21,  Hydrocodone, and Pravastatin   Review of Systems Review of Systems  Constitutional: Negative for appetite change, chills, fatigue and fever.  Respiratory: Negative for cough, choking, chest tightness and shortness of breath.   Cardiovascular: Negative for chest pain and leg swelling.  Gastrointestinal: Positive for abdominal pain, diarrhea, nausea and vomiting. Negative for constipation.  Genitourinary: Negative for dysuria and flank pain.  Skin: Negative for color change, pallor, rash and wound.     Physical Exam Triage Vital Signs ED Triage Vitals  Enc Vitals Group     BP 07/06/20 1104 121/70     Pulse Rate 07/06/20 1104 64     Resp 07/06/20 1104 16     Temp 07/06/20 1104 98.6 F (37 C)     Temp Source 07/06/20 1104 Oral     SpO2 07/06/20 1104 96 %     Weight 07/06/20 1100 200 lb (90.7 kg)     Height 07/06/20 1100 6' (1.829 m)     Head Circumference --      Peak Flow --      Pain Score 07/06/20 1100 6     Pain Loc --      Pain Edu? --      Excl. in [place]? --    No data found.  Updated Vital Signs BP 121/70 (BP Location: Left Arm)   Pulse 64   Temp 98.6 F (37 C) (Oral)   Resp 16   Ht 6' (1.829 m)   Wt 90.7 kg   SpO2 96%   BMI 27.12 kg/m   Visual Acuity Right Eye Distance:   Left Eye Distance:   Bilateral Distance:    Right Eye Near:   Left Eye Near:    Bilateral Near:     Physical Exam Vitals and nursing note reviewed.  Constitutional:      General: He is not in acute distress.    Appearance: He is well-developed. He is not ill-appearing or toxic-appearing.  HENT:     Head: Normocephalic and atraumatic.  Cardiovascular:     Rate and Rhythm: Normal rate and regular rhythm.     Heart sounds: Murmur  heard.      Comments: There is a soft systolic murmur heard 1 out of 6 Pulmonary:     Effort: Pulmonary effort is normal. No respiratory distress.     Breath sounds: Normal breath sounds. No stridor. No wheezing, rhonchi or rales.  Abdominal:      General: Abdomen is flat. Bowel sounds are normal. There is abdominal bruit. There is no distension.     Palpations: Abdomen is soft.     Tenderness: There is abdominal tenderness in the epigastric area and left lower quadrant. There is no right CVA tenderness, left CVA tenderness, guarding or rebound. Negative signs include Murphy's sign, Rovsing's sign, McBurney's sign and psoas sign.  Skin:    General: Skin is warm and dry.     Capillary Refill: Capillary refill takes less than 2 seconds.  Neurological:     General: No focal deficit present.     Mental Status: He is alert.      UC Treatments / Results  Labs (all labs ordered are listed, but only abnormal results are displayed) Labs Reviewed  COMPREHENSIVE METABOLIC PANEL - Abnormal; Notable for the following components:      Result Value   Calcium 8.8 (*)    AST 12 (*)    All other components within normal limits  URINALYSIS, COMPLETE (UACMP) WITH MICROSCOPIC - Abnormal; Notable for the following components:   Ketones, ur TRACE (*)    Bacteria, UA FEW (*)    All other components within normal limits  SEDIMENTATION RATE - Abnormal; Notable for the following components:   Sed Rate 47 (*)    All other components within normal limits  C-REACTIVE PROTEIN - Abnormal; Notable for the following components:   CRP 6.8 (*)    All other components within normal limits  AMYLASE  LIPASE, BLOOD  CBC WITH DIFFERENTIAL/PLATELET    EKG   Radiology CT ABDOMEN PELVIS W CONTRAST  Result Date: 07/06/2020 CLINICAL DATA:  Left lower quadrant pain.  Possible diverticulitis. EXAM: CT ABDOMEN AND PELVIS WITH CONTRAST TECHNIQUE: Multidetector CT imaging of the abdomen and pelvis was performed using the standard protocol following bolus administration of intravenous contrast. CONTRAST:  152m OMNIPAQUE IOHEXOL 300 MG/ML  SOLN COMPARISON:  None. FINDINGS: Lower chest: Lung bases are clear. There is calcified plaque over the left main and 3 vessel  coronary arteries. Calcified plaque over the descending thoracic aorta. Sternotomy wires are present. Hepatobiliary: Gallbladder, liver and biliary tree are normal. Pancreas: Normal. Spleen: Normal. Adrenals/Urinary Tract: Adrenal glands are normal. Kidneys are normal in size without hydronephrosis or nephrolithiasis. 2.5 cm cyst over the mid pole right kidney. Ureters and bladder are normal. Stomach/Bowel: Stomach and small bowel are normal. Appendix is normal. There is minimal diverticulosis of the colon most prominent over the sigmoid colon. There is mild wall thickening/submucosal edema over the region of the splenic flexure and proximal descending colon likely due to an acute colitis of infectious or inflammatory nature. Vascular/Lymphatic: Mild calcified plaque over the abdominal aorta which is normal caliber. No evidence of adenopathy. Reproductive: Previous prostatectomy. Other: No free fluid. Musculoskeletal: Degenerative change of the spine and hips. IMPRESSION: 1. Mild wall thickening/submucosal edema over the region of the splenic flexure and proximal descending colon likely due to an acute colitis of infectious or inflammatory nature. 2. Minimal colonic diverticulosis without evidence of acute diverticulitis. 3. 2.5 cm right renal cyst. 4. Aortic atherosclerosis. Aortic Atherosclerosis (ICD10-I70.0). Electronically Signed   By: DMarin OlpM.D.   On:  07/06/2020 15:51    Procedures Procedures (including critical care time)  Medications Ordered in UC Medications  iohexol (OMNIPAQUE) 300 MG/ML solution 100 mL (100 mLs Intravenous Contrast Given 07/06/20 1536)    Initial Impression / Assessment and Plan / UC Course  I have reviewed the triage vital signs and the nursing notes.  Pertinent labs & imaging results that were available during my care of the patient were reviewed by me and considered in my medical decision making (see chart for details).  Clinical impression:  3 days of lower  abdominal pain.  Its concentrated in the left lower quadrant a little bit in the epigastric region.  Concern for diverticulitis, colonic abscess, possible small perforation, and/or pancreatitis.  Treatment plan:  1. The findings and treatment plan were discussed in detail with the patient.  Patient was in agreement. 2.  I recommended getting a CT scan of his abdomen to further delineate any pathology.  Unfortunately the CT scan here in med been is not available as we are short staffed.  I will attempt to get that as an outpatient at the hospital.  We were able to get prior authorization and the patient will go directly to [facility] to have the CT scan done.  I will have the results called to me emergently. 3.  CT scan results as above.  There is concern for diverticulitis.  We will treat accordingly with Cipro and Flagyl.  I also did get labs.  The ones that were available did not show anything of concern.  They were reassuring.  We will contact the patient when the remainder of the labs are available if there is any abnormality. 4.  Given educational handout on diverticulitis and what foods to avoid this includes nuts and popcorn. 5.  Supportive care, plenty of fluids plenty rest.  Patient will call us if there is any problems.  He will go directly to the emergency room should he have any issues for further evaluation and management. 6.  Follow-up here as needed.   Final Clinical Impressions(s) / UC Diagnoses   Final diagnoses:  Left lower quadrant abdominal pain  Diarrhea, unspecified type  Non-intractable vomiting with nausea, unspecified vomiting type  Diverticulitis of colon     Discharge Instructions     Your labs and urine that were available are reassuring.  There are still several labs that have been sent to the hospital.  If there is anything abnormal on those labs we will contact you.  Please go directly to the emergency room at [facility]  When you check can let  them know that you are here for a CT scan and that there is an order in the computer.  You do not need to see anyone in the emergency room.  When your scan is completed they will call me directly.  Please wait in the CT department and I will speak to you about your results and what to do next.    ED Prescriptions    None     PDMP not reviewed this encounter.   Verda Cumins, MD 07/08/20 [phone]

## 2020-07-06 NOTE — ED Triage Notes (Signed)
Patient reports left abdominal pain, nausea and vomiting that started on Tuesday.  Patient states that his diarrhea and vomiting has resolved.  Patient reports ongoing left sided abdominal pain that radiates to his mid upper abdomen. Patient states that his COVID test was Negative this week.  Patient denies fevers.

## 2020-07-06 NOTE — Telephone Encounter (Signed)
Patient sent for a CT of the abdomen and pelvis.  Results are below.  Does show acute colitis that is infectious or inflammatory.  There is also some mild diverticulitis.  We will treat appropriately with Cipro and Flagyl for 10 days.  Antibiotics were sent into the pharmacy.  Patient was notified via telephone of his results and the fact that he would have antibiotics at the pharmacy.    CT ABDOMEN AND PELVIS WITH CONTRAST done 07/06/2020  TECHNIQUE: Multidetector CT imaging of the abdomen and pelvis was performed using the standard protocol following bolus administration of intravenous contrast.  CONTRAST:  134mL OMNIPAQUE IOHEXOL 300 MG/ML  SOLN  COMPARISON:  None.  FINDINGS: Lower chest: Lung bases are clear. There is calcified plaque over the left main and 3 vessel coronary arteries. Calcified plaque over the descending thoracic aorta. Sternotomy wires are present.  Hepatobiliary: Gallbladder, liver and biliary tree are normal.  Pancreas: Normal.  Spleen: Normal.  Adrenals/Urinary Tract: Adrenal glands are normal. Kidneys are normal in size without hydronephrosis or nephrolithiasis. 2.5 cm cyst over the mid pole right kidney. Ureters and bladder are normal.  Stomach/Bowel: Stomach and small bowel are normal. Appendix is normal. There is minimal diverticulosis of the colon most prominent over the sigmoid colon. There is mild wall thickening/submucosal edema over the region of the splenic flexure and proximal descending colon likely due to an acute colitis of infectious or inflammatory nature.  Vascular/Lymphatic: Mild calcified plaque over the abdominal aorta which is normal caliber. No evidence of adenopathy.  Reproductive: Previous prostatectomy.  Other: No free fluid.  Musculoskeletal: Degenerative change of the spine and hips.  IMPRESSION: 1. Mild wall thickening/submucosal edema over the region of the splenic flexure and proximal descending colon  likely due to an acute colitis of infectious or inflammatory nature. 2. Minimal colonic diverticulosis without evidence of acute diverticulitis. 3. 2.5 cm right renal cyst. 4. Aortic atherosclerosis.

## 2020-07-06 NOTE — ED Triage Notes (Signed)
Called patient's ins co @ [phone] for prior auth of CT abd/pelvis with contrast (CPT [phone]). Spoke with [employer] # [id number] valid 07-06-20 to 08/05/20.

## 2020-07-07 ENCOUNTER — Other Ambulatory Visit: Payer: Self-pay | Admitting: Gastroenterology

## 2020-07-07 DIAGNOSIS — K5732 Diverticulitis of large intestine without perforation or abscess without bleeding: Secondary | ICD-10-CM

## 2020-07-07 MED ORDER — CIPROFLOXACIN HCL 500 MG PO TABS: 500.0000 mg | ORAL_TABLET | Freq: Two times a day (BID) | ORAL | 0 refills | Status: DC

## 2020-07-07 MED ORDER — METRONIDAZOLE 500 MG PO TABS: 500.0000 mg | ORAL_TABLET | Freq: Three times a day (TID) | ORAL | 0 refills | Status: DC

## 2020-07-07 NOTE — Progress Notes (Signed)
Patient called, covering for Dr. Benson Norway. HE was seen by urgent care yesterday, had CT which suggested some ? Left sided colitis. Given cipro and flagyl but his pharmacy is not open. I called patient and then his sister who is picking up meds for him. Ordered this for [facility] on [address], sister will pick up, hopefully that pharmacy is open. They agreed with the plan.

## 2020-07-19 ENCOUNTER — Ambulatory Visit: Payer: Medicare HMO

## 2020-07-25 ENCOUNTER — Other Ambulatory Visit: Payer: Medicare HMO

## 2020-08-02 ENCOUNTER — Other Ambulatory Visit: Payer: Self-pay

## 2020-08-02 ENCOUNTER — Ambulatory Visit: Payer: Medicare HMO

## 2020-08-02 DIAGNOSIS — I2510 Atherosclerotic heart disease of native coronary artery without angina pectoris: Secondary | ICD-10-CM

## 2020-08-02 DIAGNOSIS — R0989 Other specified symptoms and signs involving the circulatory and respiratory systems: Secondary | ICD-10-CM

## 2020-08-03 ENCOUNTER — Other Ambulatory Visit: Payer: Self-pay | Admitting: Cardiology

## 2020-08-03 DIAGNOSIS — I6521 Occlusion and stenosis of right carotid artery: Secondary | ICD-10-CM

## 2020-08-08 ENCOUNTER — Ambulatory Visit: Payer: Medicare HMO | Admitting: Neurology

## 2020-08-08 ENCOUNTER — Encounter: Payer: Self-pay | Admitting: Neurology

## 2020-08-08 ENCOUNTER — Other Ambulatory Visit: Payer: Self-pay

## 2020-08-08 VITALS — Ht 72.0 in | Wt 200.0 lb

## 2020-08-08 DIAGNOSIS — Z8669 Personal history of other diseases of the nervous system and sense organs: Secondary | ICD-10-CM | POA: Diagnosis not present

## 2020-08-08 DIAGNOSIS — G629 Polyneuropathy, unspecified: Secondary | ICD-10-CM | POA: Diagnosis not present

## 2020-08-08 DIAGNOSIS — R296 Repeated falls: Secondary | ICD-10-CM

## 2020-08-08 DIAGNOSIS — H8190 Unspecified disorder of vestibular function, unspecified ear: Secondary | ICD-10-CM | POA: Diagnosis not present

## 2020-08-08 NOTE — Progress Notes (Signed)
Subjective:    Patient ID: Orvil Faraone is a 85 y.o. male.  HPI     Star Age, MD, PhD [facility] [address], [address] [place], [place] [address]  Dear Dr. Humphrey Rolls,   I saw your patient, Yonatan Guitron, upon your kind request, in my Neurologic clinic today for initial consultation of his vertigo.  The patient is unaccompanied today.  As you know, Mr. Tallo is an 85 year old right-handed gentleman with an underlying medical history of coronary artery disease with status post MI, status post CABG, status post stent placements, reflux disease, B12 deficiency, anemia, hyperlipidemia, glaucoma, hypertension, peripheral vascular disease, prostate cancer, arthritis, peripheral neuropathy, and mildly overweight state, who reports new onset vertigo since May of last year.  He reports that he went to the emergency room once because he thought he had a heart attack and some of his vertigo symptoms reminded him of his heart attack symptoms.  He started seeing ENT.  He has been on meclizine.  He was also told that his allergies may be contributing to his vertigo and he is working with his ENT on improving allergy symptoms.  Overall, for the past few weeks his vertigo has improved.  He has been checked for hearing loss and was told that he has about 40% hearing left in his left ear.  He sees ENT in [place], [place].  He has hearing aids from both ears but was told to just use the right hearing aid.  He was also advised to try to sleep on his right side.   Of note, he has a longstanding history of neuropathy.  He used to live in [place] and saw a neurologist in [place], [place] and had testing including electrical testing for neuropathy.  No cause was found per his report.  He used to be on Lyrica regularly but when he started having vertigo he stopped the Lyrica.  He just recently restarted taking it, 50 mg strength up to twice daily but typically only  takes it in the evening.  His neuropathy symptoms flareup around 4:05 PM including burning and stinging sensation but he also has chronic numbness.  In addition, he reports chronic low back pain which causes sciatica type symptoms on the right.  He also has right hip pain and bilateral knee pain.  He recently twisted his left ankle and sent pain across his lower back.  He has had residual soreness but is slowly improving.  He uses a cane outside the house and has at least 2 walkers available in the house.  He has installed approximately 12 grab bars throughout the house.  He has fallen multiple times, he estimates that he fell about 12 times in the past year.  He does report that the Lyrica makes him drowsy and therefore he does not take a daytime dose if he has to go outside or drive.  He takes Ambien at night for sleep and has been on it for the past 5 years or so.  I reviewed your office note from 03/29/2020.  He had a brain MRI without contrast on 12/23/2019 and I reviewed the results: IMPRESSION: No acute or reversible finding. No abnormality seen to explain the presenting symptoms. Normal examination, with exception of minimal small vessel change of the cerebral hemispheric white matter, less than often seen at this age.   He is divorced since 66.  He lives alone.  He has a call alert button and has  had to push the button.  In June, when he thought he was having symptoms similar to his heart attack, he pushed the button and EMS came.  He was taken to the ER at the time.  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  He has a lady friend whom he sees infrequently, she lives in [place].  He has 6 children, 4 sons, 2 daughters, 4 grandchildren and 34 great-grandchildren.  He does not smoke, he drinks alcohol very rarely, he drinks caffeine in the form of coffee, about 1 cup/day on average.  He tries to hydrate well with  water.  His Past Medical History Is Significant For: Past Medical History:  Diagnosis Date  . Arthritis   . Cancer [facility]) 1995   prostate ca   . Coronary artery disease   . Esophageal stricture 08/2016  . GERD (gastroesophageal reflux disease)   . Glaucoma   . History of hiatal hernia   . Hypertension   . MI (mitral incompetence)   . Peripheral neuropathy   . Peripheral vascular disease ([place])   . Vertigo     His Past Surgical History Is Significant For: Past Surgical History:  Procedure Laterality Date  . COLONOSCOPY    . CORONARY ANGIOPLASTY WITH STENT PLACEMENT  2005   x9   . CORONARY ARTERY BYPASS GRAFT  2011  . CORONARY STENT PLACEMENT  07/2014  . ESOPHAGOGASTRODUODENOSCOPY    . ESOPHAGOGASTRODUODENOSCOPY (EGD) WITH ESOPHAGEAL DILATION  2018  . knee scope Bilateral   . LEG SURGERY Right 09/2018  . LIPOMA EXCISION N/A 09/24/2016   Procedure: EXCISION SUBCUTANEOUS LIPOMA ON UPPER BACK;  Surgeon: Donnie Mesa, MD;  Location: [facility];  Service: General;  Laterality: N/A;  . Prostectomy  1995  . REVERSE SHOULDER ARTHROPLASTY Left 09/25/2017  . REVERSE SHOULDER ARTHROPLASTY Left 09/25/2017   Procedure: LEFT REVERSE SHOULDER ARTHROPLASTY; deltoid repair;  Surgeon: Netta Cedars, MD;  Location: [facility];  Service: Orthopedics;  Laterality: Left;  . ROTATOR CUFF REPAIR Left 2001    His Family History Is Significant For: Family History  Problem Relation Age of Onset  . Alzheimer's disease Mother   . Stroke Father   . Parkinson's disease Sister   . Cancer Sister   . Obesity Sister   . Diabetes Mellitus II Sister     His Social History Is Significant For: Social History   Socioeconomic History  . Marital status: Divorced    Spouse name: Not on file  . Number of children: 6  . Years of education: Not on file  . Highest education level: Not on file  Occupational History  . Not on file  Tobacco Use  . Smoking status: Never Smoker  . Smokeless tobacco: Never Used   Vaping Use  . Vaping Use: Never used  Substance and Sexual Activity  . Alcohol use: Yes    Alcohol/week: 7.0 standard drinks    Types: 7 Glasses of wine per week    Comment: wine with dinner  . Drug use: No  . Sexual activity: Not on file  Other Topics Concern  . Not on file  Social History Narrative  . Not on file   Social Determinants of Health   Financial Resource Strain: Not on file  Food Insecurity: Not on file  Transportation Needs: Not on file  Physical Activity: Not on file  Stress: Not on file  Social Connections: Not on file  His Allergies Are:  Allergies  Allergen Reactions  . Atorvastatin Other (See Comments)    Joint pain  . Rosuvastatin Other (See Comments)    Joint pain  . Statins Other (See Comments)    Leg pain   . Folic Acid-Vit L2-XNT Z00 Anxiety, Itching and Nausea Only  . Hydrocodone Rash  . Pravastatin Nausea Only  :   His Current Medications Are:  Outpatient Encounter Medications as of 08/08/2020  Medication Sig  . acetaminophen (TYLENOL) 650 MG CR tablet Take 650 mg by mouth as needed for pain. 2 tablets in the morning and 2 tablets at bedtime  . amLODipine (NORVASC) 10 MG tablet Take 1 tablet (10 mg total) by mouth daily.  Marland Kitchen aspirin EC 81 MG tablet Take 81 mg by mouth daily.  Marland Kitchen azelastine (OPTIVAR) 0.05 % ophthalmic solution 1 drop 2 (two) times daily  . calcium carbonate (TUMS - DOSED IN MG ELEMENTAL CALCIUM) 500 MG chewable tablet Chew 2 tablets by mouth daily as needed for indigestion or heartburn.  . cholecalciferol (VITAMIN D) 1000 units tablet Take 1,000 Units by mouth daily.  . Cyanocobalamin (B-12) 1000 MCG/ML KIT Inject 1 mL as directed every 30 (thirty) days.  . cycloSPORINE (RESTASIS) 0.05 % ophthalmic emulsion Place 1 drop into both eyes 2 (two) times daily.  . fluticasone (FLONASE) 50 MCG/ACT nasal spray Place 1-2 sprays into both nostrils as needed.  . Infant Care Products (BABY SHAMPOO EX) Rub into a wash cloth and rub into  the eyes 3 to 4 times daily for dry eyes / eye build up  . latanoprost (XALATAN) 0.005 % ophthalmic solution Place 1 drop into both eyes at bedtime.  Marland Kitchen levocetirizine (XYZAL) 5 MG tablet   . lisinopril (PRINIVIL,ZESTRIL) 20 MG tablet Take 20 mg by mouth 2 (two) times daily.   . meclizine (ANTIVERT) 12.5 MG tablet Take 1 tablet by mouth daily.  . metoprolol tartrate (LOPRESSOR) 25 MG tablet Take 0.5 tablets (12.5 mg total) by mouth 2 (two) times daily.  . nitroGLYCERIN (NITROSTAT) 0.4 MG SL tablet Place 0.4 mg under the tongue every 5 (five) minutes as needed for chest pain.  Marland Kitchen Olopatadine HCl 0.2 % SOLN Place 1 drop into both eyes daily.  . ondansetron (ZOFRAN) 4 MG tablet Take 4 mg by mouth every 8 (eight) hours as needed for nausea or vomiting.  . pantoprazole (PROTONIX) 40 MG tablet Take 40 mg by mouth every morning.   Vladimir Faster Glycol-Propyl Glycol (SYSTANE OP) Apply 1 drop to eye 5 (five) times daily as needed (dry eyes).  . pregabalin (LYRICA) 50 MG capsule Take 50 mg by mouth in the morning and at bedtime.  Marland Kitchen REPATHA SURECLICK 174 MG/ML SOAJ ADMINISTER 1 ML UNDER THE SKIN EVERY 14 DAYS  . zolpidem (AMBIEN) 10 MG tablet Take 10 mg by mouth at bedtime.  . [DISCONTINUED] azelastine (ASTELIN) 0.1 % nasal spray Place 1 spray into both nostrils 2 (two) times daily. Use in each nostril as directed (Patient not taking: Reported on 08/08/2020)  . [DISCONTINUED] ciprofloxacin (CIPRO) 500 MG tablet Take 1 tablet (500 mg total) by mouth 2 (two) times daily. (Patient not taking: Reported on 08/08/2020)  . [DISCONTINUED] metroNIDAZOLE (FLAGYL) 500 MG tablet Take 1 tablet (500 mg total) by mouth 3 (three) times daily. (Patient not taking: Reported on 08/08/2020)   No facility-administered encounter medications on file as of 08/08/2020.  :   Review of Systems:  Out of a complete 14 point review of systems, all  are reviewed and negative with the exception of these symptoms as listed below:  Review of  Systems  Neurological:       Here to discuss worsening ringing in left ear and dizziness. Pt has been dx with Vertigo in the past. Pt reports 12 falls since June of 2021. He reports when he falls he typically falls backwards. He has a hx of neuropathy and feels like this correlates to his increased falls.      Objective:  Neurological Exam  Physical Exam Physical Examination:   Vitals:   08/08/20 0934  SpO2: 98%   General Examination: The patient is a very pleasant 85 y.o. male in no acute distress. He appears well-developed and well-nourished and well groomed.   HEENT: Normocephalic, atraumatic, pupils are equal, round and reactive to light, extraocular tracking is well preserved, hearing is impaired bilaterally, no hearing aids today.  He has corrective eyeglasses in place, tympanic membranes are clear bilaterally, denies any vertiginous symptoms currently.  Speech is clear without dysarthria, hypophonia or voice tremor, neck is supple, possible subtle carotid bruits noted.  Airway examination reveals moderate mouth dryness, tongue protrudes centrally and palate elevates symmetrically.   Chest: Clear to auscultation without wheezing, rhonchi or crackles noted.  Heart: S1+S2+0, regular and normal without murmurs, rubs or gallops noted.   Abdomen: Soft, non-tender and non-distended with normal bowel sounds appreciated on auscultation.  Extremities: There is no pitting edema in the distal lower extremities bilaterally.   Skin: Warm and dry, he wears knee-high compression socks in the lower extremities and soft elastic knee braces bilaterally.   Musculoskeletal: exam reveals no obvious joint deformities, but he reports midline low back pain across the lumbar spine, without radiation currently, also reports bilateral knee pain and right hip pain.    Neurologically:  Mental status: The patient is awake, alert and oriented in all 4 spheres. His immediate and remote memory, attention,  language skills and fund of knowledge are appropriate. There is no evidence of aphasia, agnosia, apraxia or anomia. Speech is clear with normal prosody and enunciation. Thought process is linear. Mood is normal and affect is normal.  Cranial nerves II - XII are as described above under HEENT exam. In addition: shoulder shrug is normal with equal shoulder height noted. Motor exam: Normal bulk, strength and tone is noted. There is no drift, tremor or rebound. Romberg is not tested due to safety concerns.  Reflexes are 1+ in the upper extremities and absent in the lower extremities.  Toes are equivocal. Fine motor skills and coordination: intact with normal finger taps, normal hand movements, normal rapid alternating patting, normal foot taps and normal foot agility.  Cerebellar testing: No dysmetria or intention tremor on finger to nose testing. Heel to shin is unremarkable bilaterally. There is no truncal or gait ataxia.  Sensory exam: Decreased to temperature and vibration in the lower extremities, probably up to the knee areas bilaterally, also some decrease in temperature and vibration sense in the hands, probably up to the wrist areas bilaterally.   Gait, station and balance: He stands without major difficulty, does push himself up but requires no assistance.  He has a single-point cane but he does not have to use it for brief gait examination.  He walks with preserved arm swing, no shuffling or magnetic gait noted.  He turns slowly and cautiously, slightly stooped upper back.  Assessment and Plan:   In summary, Mattheus W. Bevans is a very pleasant 85 y.o.-year old male with  an underlying medical history of coronary artery disease with status post MI, status post CABG, status post stent placements, reflux disease, B12 deficiency, anemia, hyperlipidemia, glaucoma, hypertension, peripheral vascular disease, prostate cancer, arthritis, peripheral neuropathy, and mildly overweight state, who presents for  evaluation of his vertigo.  History and examination are supportive of peripheral vertigo, he is already established with ENT and has been diagnosed with Mnire's disease.  He does not have any new neurological complaints, has a longstanding history of neuropathy.  He is not keen on starting any new medications or redoing testing for neuropathy as he had evaluation for this through neurology in the past.  We talked about fall prevention and the importance of supportive treatments.  He is advised to use his cane or walker at all times especially outside the home, he has installed grab bars inside the house and uses his walker if needed.  He is aware of the sedating properties of Lyrica and while it helps the stinging sensation, and also makes him drowsy and he does not take it typically during the day especially if he knows he is going to have to drive.  He is very mindful of hydration.  He is reminded to change positions slowly and continue follow-up with his ENT.  I do not believe he needs any additional testing from my end of things.  He had a brain MRI last year which did not show any acute findings thankfully.  He has had some low back pain, probably muscular strain.  He does have a history of chronic low back issues and sciatica.  If he continues to have pain in the lower back especially with radiation, he is advised to talk to you about seeing a spine specialist.  For now, for muscular strain he can use a microwavable heat pad.  He is reminded not to fall asleep with it and not to use an electric blanket or electric heat pad for concern for overheating and burn risk.  He is advised to follow-up with you as scheduled.  I would be happy to see him back as needed.  I answered all his questions today and he was in agreement.   Thank you very much for allowing me to participate in the care of this nice patient. If I can be of any further assistance to you please do not hesitate to call me at  [phone].  Sincerely,   Star Age, MD, PhD

## 2020-08-08 NOTE — Patient Instructions (Addendum)
It was nice to meet you today.  I do believe you have episodic vertigo and chronic neuropathy, and the combination of both has likely increased your fall risk tremendously.  Please use your cane or walker at all times, at least outside.  It sounds to me that you have made quite a few adjustments including grab bars inside your home, and you have a call alert button.  I would recommend that you use your walker at night to go to the bathroom as you do take a sleeping pill which can make you drowsy and more off balance.  Please try to change positions slowly, avoid any sudden movements including sudden standing up or bending or sudden turns of your head.  Try to stay well-hydrated with water, 6 to 8 cups/day are recommended generally speaking, 8 ounce size each.  Please try to get enough rest, 7 to 8 hours of sleep per night are recommended for the average adult.  You do take several medications, some of which can affect your balance including the Lyrica and the Ambien.  I do not believe we need to schedule any additional testing from my end of things.  Please follow-up with your ENT specialist as scheduled and your primary care physician on a routine basis, I can see you back if needed.  If you continue to have low back pain, please talk to your primary care doctor about a referral to a spine specialist.  For now, for muscle strain, you can use a microwavable heatpad for your low back discomfort. Avoid falling asleep with it! Do not use an electric heat pad or blanket due to burn risk!

## 2020-09-03 ENCOUNTER — Telehealth: Payer: Self-pay

## 2020-09-03 NOTE — Telephone Encounter (Signed)
Pt called to order 4 pairs of large black knee high compression stockings. He is aware these will be ordered and we will call him when they come in.

## 2020-09-03 NOTE — Telephone Encounter (Signed)
Pt called with questions about compression stockings. I have tried to reach him and left a vm to call us back if he still needs assistance.

## 2020-09-12 DIAGNOSIS — M7989 Other specified soft tissue disorders: Secondary | ICD-10-CM

## 2020-09-28 ENCOUNTER — Other Ambulatory Visit: Payer: Self-pay

## 2020-11-04 ENCOUNTER — Other Ambulatory Visit: Payer: Self-pay | Admitting: Cardiology

## 2020-11-06 DIAGNOSIS — H353230 Exudative age-related macular degeneration, bilateral, stage unspecified: Secondary | ICD-10-CM | POA: Insufficient documentation

## 2020-12-13 DIAGNOSIS — H353122 Nonexudative age-related macular degeneration, left eye, intermediate dry stage: Secondary | ICD-10-CM | POA: Diagnosis not present

## 2020-12-13 DIAGNOSIS — H353211 Exudative age-related macular degeneration, right eye, with active choroidal neovascularization: Secondary | ICD-10-CM | POA: Diagnosis not present

## 2020-12-17 ENCOUNTER — Other Ambulatory Visit: Payer: Self-pay | Admitting: Cardiology

## 2020-12-27 DIAGNOSIS — M25572 Pain in left ankle and joints of left foot: Secondary | ICD-10-CM | POA: Diagnosis not present

## 2020-12-27 DIAGNOSIS — E782 Mixed hyperlipidemia: Secondary | ICD-10-CM | POA: Diagnosis not present

## 2020-12-27 DIAGNOSIS — M25562 Pain in left knee: Secondary | ICD-10-CM | POA: Diagnosis not present

## 2020-12-27 DIAGNOSIS — I10 Essential (primary) hypertension: Secondary | ICD-10-CM | POA: Diagnosis not present

## 2020-12-31 ENCOUNTER — Ambulatory Visit: Payer: Medicare HMO | Admitting: Cardiology

## 2020-12-31 ENCOUNTER — Other Ambulatory Visit: Payer: Self-pay

## 2020-12-31 ENCOUNTER — Encounter: Payer: Self-pay | Admitting: Cardiology

## 2020-12-31 VITALS — BP 137/67 | HR 68 | Temp 98.7°F | Resp 16 | Ht 72.0 in | Wt 203.8 lb

## 2020-12-31 DIAGNOSIS — I2510 Atherosclerotic heart disease of native coronary artery without angina pectoris: Secondary | ICD-10-CM

## 2020-12-31 DIAGNOSIS — I10 Essential (primary) hypertension: Secondary | ICD-10-CM | POA: Diagnosis not present

## 2020-12-31 DIAGNOSIS — E782 Mixed hyperlipidemia: Secondary | ICD-10-CM

## 2020-12-31 DIAGNOSIS — Z951 Presence of aortocoronary bypass graft: Secondary | ICD-10-CM

## 2020-12-31 MED ORDER — FENOFIBRATE 145 MG PO TABS: 145.0000 mg | ORAL_TABLET | Freq: Every day | ORAL | 3 refills | Status: DC

## 2020-12-31 NOTE — Progress Notes (Signed)
Primary Physician/Referring:  Perrin Maltese, MD  Patient ID: [name] [name], male    DOB: March 12, 1936, 85 y.o.   MRN: [id number]  Chief Complaint  Patient presents with   Coronary Artery Disease   Hypertension   Hyperlipidemia   Follow-up    6 months   Loss of Consciousness   HPI:    [name] [name]  is a 85 y.o. Caucasian male with known CAD, hyperlipidemia, hypertension and esophageal stricture S/P dilatation remotely, multiple coronary interventions and eventually underwent CABG 2 in 2010.  For NSTEMI, he has had SVG to D2 stent in 2010 and again in January 2017, patent stents by angiography in January 2018.  He has had 2 coronary intervention to SVG to D2 with placement of the proximal stent in 2010 and mid segment stent in January 2017 when he presented with NSTEMI  in [place].  He is presently doing well, has not had any recurrence of angina although he continues to have episodes of BPH.  His activity is limited due to arthritis and neuropathy. He has noticed occasional episodes of marked dizziness when he stands up.  He was told to have elevated triglycerides and was tried on Lovaza which patient did not tolerate due to abdominal discomfort and diarrhea.  I do not have the results of the labs.  Past Medical History:  Diagnosis Date   Arthritis    Cancer [facility]) 1995   prostate ca    Coronary artery disease    Esophageal stricture 08/2016   GERD (gastroesophageal reflux disease)    Glaucoma    History of hiatal hernia    Hypertension    MI (mitral incompetence)    Peripheral neuropathy    Peripheral vascular disease (HCC)    Vertigo    Past Surgical History:  Procedure Laterality Date   COLONOSCOPY     CORONARY ANGIOPLASTY WITH STENT PLACEMENT  2005   x9    CORONARY ARTERY BYPASS GRAFT  2011   CORONARY STENT PLACEMENT  07/2014   ESOPHAGOGASTRODUODENOSCOPY     ESOPHAGOGASTRODUODENOSCOPY (EGD) WITH ESOPHAGEAL DILATION  2018   knee scope Bilateral    LEG  SURGERY Right 09/2018   LIPOMA EXCISION N/A 09/24/2016   Procedure: EXCISION SUBCUTANEOUS LIPOMA ON UPPER BACK;  Surgeon: Donnie Mesa, MD;  Location: [place];  Service: General;  Laterality: N/A;   [place] Left 09/25/2017   REVERSE SHOULDER ARTHROPLASTY Left 09/25/2017   Procedure: LEFT REVERSE SHOULDER ARTHROPLASTY; deltoid repair;  Surgeon: Netta Cedars, MD;  Location: [place];  Service: Orthopedics;  Laterality: Left;   ROTATOR CUFF REPAIR Left 2001   Family History  Problem Relation Age of Onset   Alzheimer's disease Mother    Stroke Father    Parkinson's disease Sister    Cancer Sister    Obesity Sister    Diabetes Mellitus II Sister    Social History   Tobacco Use   Smoking status: Never   Smokeless tobacco: Never  Substance Use Topics   Alcohol use: Yes    Alcohol/week: 7.0 standard drinks    Types: 7 Glasses of wine per week    Comment: wine with dinner    Marital Status: Single ROS  Review of Systems  Eyes:  Positive for blurred vision (macular degeneration).  Cardiovascular:  Positive for leg swelling. Negative for chest pain and dyspnea on exertion.  Musculoskeletal:  Positive for back pain.  Gastrointestinal:  Negative for melena.  Neurological:  Positive for disturbances in coordination, dizziness, numbness and paresthesias.  Objective  Blood pressure 137/67, pulse 68, temperature 98.7 F (37.1 C), temperature source Temporal, resp. rate 16, height 6' (1.829 m), weight 203 lb 12.8 oz (92.4 kg), SpO2 96 %.  Vitals with BMI 12/31/2020 08/08/2020 07/06/2020  Height '6\' 0"'  '6\' 0"'  '6\' 0"'   Weight 203 lbs 13 oz 200 lbs 200 lbs  BMI 27.63 46.27 03.50  Systolic 093 - 818  Diastolic 67 - 70  Pulse 68 - 64    Orthostatic VS for the past 72 hrs (Last 3 readings):  Orthostatic BP Patient Position BP Location Cuff Size Orthostatic Pulse  12/31/20 1112 122/63 Standing Left Arm Large 61  12/31/20 1111 126/58 Sitting Left Arm Large 57   12/31/20 1109 120/55 Supine Left Arm Large 54     Physical Exam Constitutional:      Appearance: He is well-developed.  Neck:     Vascular: Carotid bruit (bilateral soft bruit) present. No JVD.  Cardiovascular:     Rate and Rhythm: Normal rate and regular rhythm.     Pulses: Intact distal pulses.          Femoral pulses are 2+ on the right side and 2+ on the left side.      Dorsalis pedis pulses are 0 on the right side and 0 on the left side.       Posterior tibial pulses are 0 on the right side and 0 on the left side.     Heart sounds: Normal heart sounds. No murmur heard.   No gallop.  Pulmonary:     Effort: Pulmonary effort is normal.     Breath sounds: Normal breath sounds.  Abdominal:     General: Bowel sounds are normal.     Palpations: Abdomen is soft.  Musculoskeletal:        General: No swelling.   Laboratory examination:   Recent Labs    06/29/20 0828 07/06/20 1148  NA 140 137  K 4.4 4.2  CL 105 103  CO2 23 27  GLUCOSE 85 94  BUN 24 16  CREATININE 0.91 0.92  CALCIUM 9.1 8.8*  GFRNONAA 78 >60  GFRAA 90  --    CrCl cannot be calculated (Patient's most recent lab result is older than the maximum 21 days allowed.).  CMP Latest Ref Rng & Units 07/06/2020 06/29/2020 12/23/2019  Glucose 70 - 99 mg/dL 94 85 111(H)  BUN 8 - 23 mg/dL '16 24 16  ' Creatinine 0.61 - 1.24 mg/dL 0.92 0.91 0.91  Sodium 135 - 145 mmol/L 137 140 135  Potassium 3.5 - 5.1 mmol/L 4.2 4.4 4.0  Chloride 98 - 111 mmol/L 103 105 102  CO2 22 - 32 mmol/L '27 23 26  ' Calcium 8.9 - 10.3 mg/dL 8.8(L) 9.1 8.9  Total Protein 6.5 - 8.1 g/dL 7.3 6.1 6.8  Total Bilirubin 0.3 - 1.2 mg/dL 0.6 0.4 0.8  Alkaline Phos 38 - 126 U/L 50 52 44  AST 15 - 41 U/L 12(L) 9 16  ALT 0 - 44 U/L '15 11 19   ' CBC Latest Ref Rng & Units 07/06/2020 06/29/2020 12/23/2019  WBC 4.0 - 10.5 K/uL 7.4 5.2 8.1  Hemoglobin 13.0 - 17.0 g/dL 13.0 13.1 13.2  Hematocrit 39.0 - 52.0 % 39.1 39.5 40.0  Platelets 150 - 400 K/uL 188 172 182    Lipid Panel Recent Labs    01/10/20 0901 06/29/20 0827  CHOL 154 147  TRIG 159* 143  LDLCALC  80 82  HDL 47 40    External labs  09/12/2019:  Hb 13.4/15.6, platelets 178, normal indicis. Serum glucose 96 mg, BMP normal N, EGFR >60 mL. A1c 5.4%.  TSH normal. Total cholesterol 164, triglycerides 171, HDL 47, LDL 88. Vitamin D 36.4.  BNP 93.0 08/13/2019   Hb 12.8/HCT 37.3, platelets 144.  08/13/2019    Medications and allergies   Allergies  Allergen Reactions   Lovaza [Omega-3-Acid Ethyl Esters] Diarrhea   Atorvastatin Other (See Comments)    Joint pain   Rosuvastatin Other (See Comments)    Joint pain   Statins Other (See Comments)    Leg pain    Folic Acid-Vit R6-EAV W09 Anxiety, Itching and Nausea Only   Hydrocodone Rash   Pravastatin Nausea Only    Current Outpatient Medications on File Prior to Visit  Medication Sig Dispense Refill   acetaminophen (TYLENOL) 650 MG CR tablet Take 650 mg by mouth as needed for pain. 2 tablets in the morning and 2 tablets at bedtime     amLODipine (NORVASC) 10 MG tablet Take 1 tablet (10 mg total) by mouth daily. 90 tablet 3   aspirin EC 81 MG tablet Take 81 mg by mouth daily.     azelastine (OPTIVAR) 0.05 % ophthalmic solution 1 drop 2 (two) times daily     calcium carbonate (TUMS - DOSED IN MG ELEMENTAL CALCIUM) 500 MG chewable tablet Chew 2 tablets by mouth daily as needed for indigestion or heartburn.     cholecalciferol (VITAMIN D) 1000 units tablet Take 1,000 Units by mouth daily.     Cyanocobalamin (B-12) 1000 MCG/ML KIT Inject 1 mL as directed every 30 (thirty) days.     cycloSPORINE (RESTASIS) 0.05 % ophthalmic emulsion Place 1 drop into both eyes 2 (two) times daily.     cycloSPORINE (RESTASIS) 0.05 % ophthalmic emulsion Place 1-2 drops into both eyes daily.     fluticasone (FLONASE) 50 MCG/ACT nasal spray Place 1-2 sprays into both nostrils as needed.     Infant Care Products (BABY SHAMPOO EX) Rub into a wash cloth and  rub into the eyes 3 to 4 times daily for dry eyes / eye build up     latanoprost (XALATAN) 0.005 % ophthalmic solution Place 1 drop into both eyes at bedtime.  0   levocetirizine (XYZAL) 5 MG tablet      lisinopril (PRINIVIL,ZESTRIL) 20 MG tablet Take 20 mg by mouth 2 (two) times daily.      meclizine (ANTIVERT) 12.5 MG tablet Take 1 tablet by mouth daily.     metoprolol tartrate (LOPRESSOR) 25 MG tablet TAKE 1/2 TABLET BY MOUTH TWICE DAILY 180 tablet 3   nitroGLYCERIN (NITROSTAT) 0.4 MG SL tablet Place 0.4 mg under the tongue every 5 (five) minutes as needed for chest pain.     pantoprazole (PROTONIX) 40 MG tablet Take 40 mg by mouth every morning.      Polyethyl Glycol-Propyl Glycol (SYSTANE OP) Apply 1 drop to eye 5 (five) times daily as needed (dry eyes).     REPATHA SURECLICK 811 MG/ML SOAJ ADMINISTER 1 ML UNDER THE SKIN EVERY 14 DAYS 2 mL 1   zolpidem (AMBIEN) 10 MG tablet Take 10 mg by mouth at bedtime.     pregabalin (LYRICA) 50 MG capsule Take 50 mg by mouth in the morning and at bedtime. (Patient not taking: Reported on 12/31/2020)     No current facility-administered medications on file prior to visit.     Radiology:  CT of the abdomen and pelvis 08/28/2020: Noncontrast CT, abdominal exam revealing normal liver, spleen, pancreas, adrenal glands.  Left kidney is normal. Low-density lesion in the right kidney consistent with a 3 mm exophytic hyperdense lesion in the kidney consistent with hyperdense cyst.   No pathologic lymphadenopathy. Atherosclerosis of the aorta and iliac vessels without aneurysm.  Mild coronary calcification. No significant change from 07/06/2020.  Cardiac Studies:   Coronary Angiogram  07/24/2015: ( repeat cath 07/29/2016:) Prox LAD: 100% stenosis, LIMA to LAD graft patent, Mid RCA: 20% stenosis; SVG to D2: 100% stenosis in the middle third of the graft, s/p stenting with 3.5 x 33 Promus premier DES. Stent in the proximal graft, 3.5x33 mm Xience DES placed  March 2014 widely patent. Diffuse native vessel disease. Codominant system. Normal LVEF.    Zio Patch Extended out patient EKG monitoring 7 days 02/27/2020 through 03/05/2020:  There were 47 patient triggered activities, 7 dairy events correlating normal sinus rhythm, PACs and PVCs. Predominant rhythm is normal sinus rhythm.  There was 1 episode of 4 beat NSVT at 6:25 AM.  There are brief atrial tachycardia, fastest 6 beats at 152 bpm and longest 20 beats at 95 bpm which was symptomatic. No atrial fibrillation, no heart block.  Rare PVC noted.  Carotid artery duplex 05/02/2021 Stenosis in the right internal carotid artery (1-15%). Stenosis in the right external carotid artery (<50%). Peak systolic velocities in the left bifurcation, internal, external and common carotid arteries are within normal limits. Both vertebral arteries are patent with antegrade flow. No prior studies for comparison. Follow up in one year is appropriate if clinically indicated.  Echocardiogram 08/02/2020: Normal LV systolic function with visual EF 55-60%. Left ventricle cavity is normal in size. Mild left ventricular hypertrophy. Normal global wall motion. Indeterminate diastolic filling pattern. Left atrial cavity is mildly dilated. Mild (Grade I) aortic regurgitation. Mild (Grade I) mitral regurgitation. Mild tricuspid regurgitation. No evidence of pulmonary hypertension. Compared to prior study dated 02/06/2016: no significant changes.   EKG:  EKG 12/31/2020: Marked sinus bradycardia at rate of 54 bpm with first-degree AV block, inferior infarct old.  Anteroseptal infarct old.  Nonspecific T abnormality.  No significant change from 06/28/2020.  Assessment     ICD-10-CM   1. Coronary artery disease involving native coronary artery of native heart without angina pectoris  I25.10 EKG 12-Lead    2. Hx of CABG 2010: LIMA to LAD; SVG to D2  Z95.1     3. Essential hypertension  I10     4. Mixed hyperlipidemia   E78.2 Lipid Panel With LDL/HDL Ratio    LDL cholesterol, direct    LDL cholesterol, direct    Lipid Panel With LDL/HDL Ratio      Meds ordered this encounter  Medications   fenofibrate (TRICOR) 145 MG tablet    Sig: Take 1 tablet (145 mg total) by mouth daily.    Dispense:  90 tablet    Refill:  3    Medications Discontinued During This Encounter  Medication Reason   Olopatadine HCl 0.2 % SOLN Error   ondansetron (ZOFRAN) 4 MG tablet Error   Recommendations:   Daltyn W. Bordley  is a 85 y.o. Caucasian male with known CAD, hyperlipidemia, hypertension and esophageal stricture S/P dilatation remotely, multiple coronary interventions and eventually underwent CABG 2 in 2010.  For NSTEMI, he has had SVG to D2 stent in 2010 and again in January 2017, patent stents by angiography in January 2018.  He has severe  peripheral neuropathy, chronic back pain and statin intolerance.  He is presently tolerating Repatha, he has recently had labs from his PCP which I do not have, was told to have elevated triglycerides.  He could not tolerate Lovaza due to severe GI disturbance, hence I will start him on Tricor 145 mg daily check lipid profile testing and direct LDL in 3 months.  From cardiac standpoint he is doing well without recurrence of angina or heart failure symptoms and no change in physical exam.  I will see him back on an annual basis.   Adrian Prows, MD, [facility] 12/31/2020, 9:50 PM Office: [phone] Pager: [phone]

## 2021-01-07 DIAGNOSIS — D519 Vitamin B12 deficiency anemia, unspecified: Secondary | ICD-10-CM | POA: Diagnosis not present

## 2021-01-09 DIAGNOSIS — S99912A Unspecified injury of left ankle, initial encounter: Secondary | ICD-10-CM | POA: Diagnosis not present

## 2021-01-09 DIAGNOSIS — S83242A Other tear of medial meniscus, current injury, left knee, initial encounter: Secondary | ICD-10-CM | POA: Diagnosis not present

## 2021-01-09 DIAGNOSIS — M1712 Unilateral primary osteoarthritis, left knee: Secondary | ICD-10-CM | POA: Diagnosis not present

## 2021-01-09 DIAGNOSIS — S86312A Strain of muscle(s) and tendon(s) of peroneal muscle group at lower leg level, left leg, initial encounter: Secondary | ICD-10-CM | POA: Diagnosis not present

## 2021-01-09 DIAGNOSIS — M7652 Patellar tendinitis, left knee: Secondary | ICD-10-CM | POA: Diagnosis not present

## 2021-01-10 DIAGNOSIS — H2513 Age-related nuclear cataract, bilateral: Secondary | ICD-10-CM | POA: Diagnosis not present

## 2021-01-10 DIAGNOSIS — H353211 Exudative age-related macular degeneration, right eye, with active choroidal neovascularization: Secondary | ICD-10-CM | POA: Diagnosis not present

## 2021-01-10 DIAGNOSIS — H353122 Nonexudative age-related macular degeneration, left eye, intermediate dry stage: Secondary | ICD-10-CM | POA: Diagnosis not present

## 2021-01-10 DIAGNOSIS — H43813 Vitreous degeneration, bilateral: Secondary | ICD-10-CM | POA: Diagnosis not present

## 2021-01-10 NOTE — Progress Notes (Signed)
Labs 11/07/2020:  Hb 13.6/HCT 41.5, platelets 181, normal indicis.  Serum glucose 93 mg, BUN 14, creatinine 0.87, EGFR 86 mL, potassium 4.7, CMP otherwise normal.  Total cholesterol 148, triglycerides 259, HDL 42, LDL 65.  A1c 5.6%.  TSH normal at 1.170.

## 2021-01-24 DIAGNOSIS — M170 Bilateral primary osteoarthritis of knee: Secondary | ICD-10-CM | POA: Diagnosis not present

## 2021-01-24 DIAGNOSIS — M1711 Unilateral primary osteoarthritis, right knee: Secondary | ICD-10-CM | POA: Diagnosis not present

## 2021-01-24 DIAGNOSIS — I739 Peripheral vascular disease, unspecified: Secondary | ICD-10-CM | POA: Diagnosis not present

## 2021-01-24 DIAGNOSIS — G8929 Other chronic pain: Secondary | ICD-10-CM | POA: Diagnosis not present

## 2021-01-24 DIAGNOSIS — Z9889 Other specified postprocedural states: Secondary | ICD-10-CM | POA: Diagnosis not present

## 2021-01-24 DIAGNOSIS — M1712 Unilateral primary osteoarthritis, left knee: Secondary | ICD-10-CM | POA: Diagnosis not present

## 2021-01-24 DIAGNOSIS — M25562 Pain in left knee: Secondary | ICD-10-CM | POA: Diagnosis not present

## 2021-02-05 DIAGNOSIS — D519 Vitamin B12 deficiency anemia, unspecified: Secondary | ICD-10-CM | POA: Diagnosis not present

## 2021-02-05 DIAGNOSIS — M5432 Sciatica, left side: Secondary | ICD-10-CM | POA: Diagnosis not present

## 2021-02-05 DIAGNOSIS — E782 Mixed hyperlipidemia: Secondary | ICD-10-CM | POA: Diagnosis not present

## 2021-02-05 DIAGNOSIS — M66872 Spontaneous rupture of other tendons, left ankle and foot: Secondary | ICD-10-CM | POA: Diagnosis not present

## 2021-02-05 DIAGNOSIS — G609 Hereditary and idiopathic neuropathy, unspecified: Secondary | ICD-10-CM | POA: Diagnosis not present

## 2021-02-07 DIAGNOSIS — H353211 Exudative age-related macular degeneration, right eye, with active choroidal neovascularization: Secondary | ICD-10-CM | POA: Diagnosis not present

## 2021-02-07 DIAGNOSIS — H353122 Nonexudative age-related macular degeneration, left eye, intermediate dry stage: Secondary | ICD-10-CM | POA: Diagnosis not present

## 2021-02-11 ENCOUNTER — Other Ambulatory Visit: Payer: Self-pay | Admitting: Cardiology

## 2021-02-13 DIAGNOSIS — M170 Bilateral primary osteoarthritis of knee: Secondary | ICD-10-CM | POA: Diagnosis not present

## 2021-02-18 DIAGNOSIS — E782 Mixed hyperlipidemia: Secondary | ICD-10-CM | POA: Diagnosis not present

## 2021-02-19 LAB — LIPID PANEL WITH LDL/HDL RATIO
Cholesterol, Total: 134 mg/dL (ref 100–199)
HDL: 59 mg/dL (ref >39)
LDL Chol Calc (NIH): 62 mg/dL (ref 0–99)
LDL/HDL Ratio: 1.1 ratio (ref 0.0–3.6)
Triglycerides: 59 mg/dL (ref 0–149)
VLDL Cholesterol Cal: 13 mg/dL (ref 5–40)

## 2021-02-19 LAB — LDL CHOLESTEROL, DIRECT: LDL Direct: 62 mg/dL (ref 0–99)

## 2021-02-20 DIAGNOSIS — M170 Bilateral primary osteoarthritis of knee: Secondary | ICD-10-CM | POA: Diagnosis not present

## 2021-03-01 DIAGNOSIS — M170 Bilateral primary osteoarthritis of knee: Secondary | ICD-10-CM | POA: Diagnosis not present

## 2021-03-05 ENCOUNTER — Ambulatory Visit (INDEPENDENT_AMBULATORY_CARE_PROVIDER_SITE_OTHER): Payer: Medicare HMO

## 2021-03-05 ENCOUNTER — Ambulatory Visit (INDEPENDENT_AMBULATORY_CARE_PROVIDER_SITE_OTHER): Payer: Medicare HMO | Admitting: Podiatry

## 2021-03-05 ENCOUNTER — Other Ambulatory Visit: Payer: Self-pay

## 2021-03-05 DIAGNOSIS — R2681 Unsteadiness on feet: Secondary | ICD-10-CM

## 2021-03-05 DIAGNOSIS — G629 Polyneuropathy, unspecified: Secondary | ICD-10-CM | POA: Diagnosis not present

## 2021-03-05 NOTE — Progress Notes (Signed)
   HPI: 85 y.o. male presenting today presenting as a new patient for evaluation of bilateral lower extremity unsteadiness of gait.  Patient states that he has had some foot pain in the past.  His main complaint is the vertigo and neuropathy create significant unsteadiness of his gait.  He states that he fell 21 times over the past year.  He had an MRI to the left lower extremity on 01/09/2021.  He presents today with a copy of the MRI.  He presents for second opinion and further treatment and evaluation  Past Medical History:  Diagnosis Date   Arthritis    Cancer ([place]) 1995   prostate ca    Coronary artery disease    Esophageal stricture 08/2016   GERD (gastroesophageal reflux disease)    Glaucoma    History of hiatal hernia    Hypertension    MI (mitral incompetence)    Peripheral neuropathy    Peripheral vascular disease (HCC)    Vertigo      Physical Exam: General: The patient is alert and oriented x3 in no acute distress.  Dermatology: Skin is cool, dry and supple bilateral lower extremities. Negative for open lesions or macerations.  Vascular: Skin is cool to touch.  Capillary refill slightly delayed.  Pulses are palpable  Neurological: Epicritic and protective threshold diminished bilaterally.   Musculoskeletal Exam: No pedal deformity noted  MRI LT ANKLE WO CONTRAST 01/09/2021 Examination moderately degraded by motion artifact.  1. Near complete tear of the peroneus longus as it courses underneath calcaneus. Longitudinal split tear of the peroneus brevis. Mild peroneal tenosynovitis.   2. Mild distal Achilles tendinosis with minimal retrocalcaneal bursitis.   3. Small plantar fibroma along the central plantar fascia band.   4. Mild multifocal degenerative changes of the midfoot.  Assessment: 1.  Instability of gait 2.  Peripheral polyneuropathy   Plan of Care:  1. Patient evaluated.  MRI reviewed that was taken 01/09/2021 2.  After discussing with the patient,  recommend conservative treatment. 3.  Although custom molded orthotics would likely help with the stability the patient cannot afford them.  OTC power step insoles were provided for the patient with a stability heel.  Walking in the office the patient felt significantly more stable. 4.  Continue good conservative shoes and stability shoes  5.  Return to clinic as needed      Edrick Kins, DPM [facility]  Dr. Edrick Kins, DPM    [address], [place] [address]                Office [phone]  Fax [phone]

## 2021-03-06 DIAGNOSIS — D519 Vitamin B12 deficiency anemia, unspecified: Secondary | ICD-10-CM | POA: Diagnosis not present

## 2021-03-07 DIAGNOSIS — H353122 Nonexudative age-related macular degeneration, left eye, intermediate dry stage: Secondary | ICD-10-CM | POA: Diagnosis not present

## 2021-03-07 DIAGNOSIS — H43813 Vitreous degeneration, bilateral: Secondary | ICD-10-CM | POA: Diagnosis not present

## 2021-03-07 DIAGNOSIS — H2513 Age-related nuclear cataract, bilateral: Secondary | ICD-10-CM | POA: Diagnosis not present

## 2021-03-07 DIAGNOSIS — H353211 Exudative age-related macular degeneration, right eye, with active choroidal neovascularization: Secondary | ICD-10-CM | POA: Diagnosis not present

## 2021-04-02 DIAGNOSIS — G8929 Other chronic pain: Secondary | ICD-10-CM | POA: Diagnosis not present

## 2021-04-02 DIAGNOSIS — R2681 Unsteadiness on feet: Secondary | ICD-10-CM | POA: Diagnosis not present

## 2021-04-02 DIAGNOSIS — Z8546 Personal history of malignant neoplasm of prostate: Secondary | ICD-10-CM | POA: Diagnosis not present

## 2021-04-02 DIAGNOSIS — Z6826 Body mass index (BMI) 26.0-26.9, adult: Secondary | ICD-10-CM | POA: Diagnosis not present

## 2021-04-02 DIAGNOSIS — G629 Polyneuropathy, unspecified: Secondary | ICD-10-CM | POA: Diagnosis not present

## 2021-04-02 DIAGNOSIS — D649 Anemia, unspecified: Secondary | ICD-10-CM | POA: Diagnosis not present

## 2021-04-02 DIAGNOSIS — Z8249 Family history of ischemic heart disease and other diseases of the circulatory system: Secondary | ICD-10-CM | POA: Diagnosis not present

## 2021-04-02 DIAGNOSIS — M545 Low back pain, unspecified: Secondary | ICD-10-CM | POA: Diagnosis not present

## 2021-04-02 DIAGNOSIS — N529 Male erectile dysfunction, unspecified: Secondary | ICD-10-CM | POA: Diagnosis not present

## 2021-04-02 DIAGNOSIS — E785 Hyperlipidemia, unspecified: Secondary | ICD-10-CM | POA: Diagnosis not present

## 2021-04-02 DIAGNOSIS — Z7982 Long term (current) use of aspirin: Secondary | ICD-10-CM | POA: Diagnosis not present

## 2021-04-02 DIAGNOSIS — M1990 Unspecified osteoarthritis, unspecified site: Secondary | ICD-10-CM | POA: Diagnosis not present

## 2021-04-02 DIAGNOSIS — I739 Peripheral vascular disease, unspecified: Secondary | ICD-10-CM | POA: Diagnosis not present

## 2021-04-02 DIAGNOSIS — H409 Unspecified glaucoma: Secondary | ICD-10-CM | POA: Diagnosis not present

## 2021-04-02 DIAGNOSIS — F419 Anxiety disorder, unspecified: Secondary | ICD-10-CM | POA: Diagnosis not present

## 2021-04-02 DIAGNOSIS — Z7722 Contact with and (suspected) exposure to environmental tobacco smoke (acute) (chronic): Secondary | ICD-10-CM | POA: Diagnosis not present

## 2021-04-02 DIAGNOSIS — E663 Overweight: Secondary | ICD-10-CM | POA: Diagnosis not present

## 2021-04-02 DIAGNOSIS — N3946 Mixed incontinence: Secondary | ICD-10-CM | POA: Diagnosis not present

## 2021-04-02 DIAGNOSIS — T753XXD Motion sickness, subsequent encounter: Secondary | ICD-10-CM | POA: Diagnosis not present

## 2021-04-02 DIAGNOSIS — I25119 Atherosclerotic heart disease of native coronary artery with unspecified angina pectoris: Secondary | ICD-10-CM | POA: Diagnosis not present

## 2021-04-02 DIAGNOSIS — Z9181 History of falling: Secondary | ICD-10-CM | POA: Diagnosis not present

## 2021-04-09 DIAGNOSIS — D519 Vitamin B12 deficiency anemia, unspecified: Secondary | ICD-10-CM | POA: Diagnosis not present

## 2021-04-09 DIAGNOSIS — Z23 Encounter for immunization: Secondary | ICD-10-CM | POA: Diagnosis not present

## 2021-04-10 DIAGNOSIS — H814 Vertigo of central origin: Secondary | ICD-10-CM | POA: Diagnosis not present

## 2021-04-10 DIAGNOSIS — M940 Chondrocostal junction syndrome [Tietze]: Secondary | ICD-10-CM | POA: Diagnosis not present

## 2021-04-10 DIAGNOSIS — R1310 Dysphagia, unspecified: Secondary | ICD-10-CM | POA: Diagnosis not present

## 2021-04-15 ENCOUNTER — Other Ambulatory Visit: Payer: Self-pay | Admitting: Cardiology

## 2021-04-15 ENCOUNTER — Encounter: Payer: Self-pay | Admitting: Cardiology

## 2021-04-18 DIAGNOSIS — H353211 Exudative age-related macular degeneration, right eye, with active choroidal neovascularization: Secondary | ICD-10-CM | POA: Diagnosis not present

## 2021-04-18 DIAGNOSIS — H353122 Nonexudative age-related macular degeneration, left eye, intermediate dry stage: Secondary | ICD-10-CM | POA: Diagnosis not present

## 2021-04-18 DIAGNOSIS — H2513 Age-related nuclear cataract, bilateral: Secondary | ICD-10-CM | POA: Diagnosis not present

## 2021-04-18 DIAGNOSIS — H43813 Vitreous degeneration, bilateral: Secondary | ICD-10-CM | POA: Diagnosis not present

## 2021-05-09 DIAGNOSIS — I10 Essential (primary) hypertension: Secondary | ICD-10-CM | POA: Diagnosis not present

## 2021-05-09 DIAGNOSIS — G609 Hereditary and idiopathic neuropathy, unspecified: Secondary | ICD-10-CM | POA: Diagnosis not present

## 2021-05-09 DIAGNOSIS — I739 Peripheral vascular disease, unspecified: Secondary | ICD-10-CM | POA: Diagnosis not present

## 2021-05-09 DIAGNOSIS — E782 Mixed hyperlipidemia: Secondary | ICD-10-CM | POA: Diagnosis not present

## 2021-05-09 DIAGNOSIS — D519 Vitamin B12 deficiency anemia, unspecified: Secondary | ICD-10-CM | POA: Diagnosis not present

## 2021-05-09 DIAGNOSIS — B356 Tinea cruris: Secondary | ICD-10-CM | POA: Diagnosis not present

## 2021-05-27 DIAGNOSIS — M7989 Other specified soft tissue disorders: Secondary | ICD-10-CM

## 2021-05-27 DIAGNOSIS — H353211 Exudative age-related macular degeneration, right eye, with active choroidal neovascularization: Secondary | ICD-10-CM | POA: Diagnosis not present

## 2021-05-27 DIAGNOSIS — H353122 Nonexudative age-related macular degeneration, left eye, intermediate dry stage: Secondary | ICD-10-CM | POA: Diagnosis not present

## 2021-05-27 DIAGNOSIS — H43813 Vitreous degeneration, bilateral: Secondary | ICD-10-CM | POA: Diagnosis not present

## 2021-05-27 DIAGNOSIS — H2513 Age-related nuclear cataract, bilateral: Secondary | ICD-10-CM | POA: Diagnosis not present

## 2021-05-28 DIAGNOSIS — Z8546 Personal history of malignant neoplasm of prostate: Secondary | ICD-10-CM | POA: Diagnosis not present

## 2021-05-28 DIAGNOSIS — L304 Erythema intertrigo: Secondary | ICD-10-CM | POA: Diagnosis not present

## 2021-05-28 DIAGNOSIS — N281 Cyst of kidney, acquired: Secondary | ICD-10-CM | POA: Diagnosis not present

## 2021-05-28 DIAGNOSIS — N39492 Postural (urinary) incontinence: Secondary | ICD-10-CM | POA: Diagnosis not present

## 2021-05-28 DIAGNOSIS — Z9079 Acquired absence of other genital organ(s): Secondary | ICD-10-CM | POA: Diagnosis not present

## 2021-05-31 DIAGNOSIS — I2510 Atherosclerotic heart disease of native coronary artery without angina pectoris: Secondary | ICD-10-CM | POA: Diagnosis not present

## 2021-05-31 DIAGNOSIS — R0789 Other chest pain: Secondary | ICD-10-CM | POA: Diagnosis not present

## 2021-06-12 DIAGNOSIS — D519 Vitamin B12 deficiency anemia, unspecified: Secondary | ICD-10-CM | POA: Diagnosis not present

## 2021-06-18 DIAGNOSIS — J301 Allergic rhinitis due to pollen: Secondary | ICD-10-CM | POA: Diagnosis not present

## 2021-06-18 DIAGNOSIS — H8109 Meniere's disease, unspecified ear: Secondary | ICD-10-CM | POA: Diagnosis not present

## 2021-06-20 ENCOUNTER — Other Ambulatory Visit: Payer: Self-pay

## 2021-06-20 DIAGNOSIS — K222 Esophageal obstruction: Secondary | ICD-10-CM | POA: Diagnosis not present

## 2021-06-20 DIAGNOSIS — I10 Essential (primary) hypertension: Secondary | ICD-10-CM | POA: Diagnosis not present

## 2021-06-20 DIAGNOSIS — R1310 Dysphagia, unspecified: Secondary | ICD-10-CM | POA: Diagnosis not present

## 2021-06-20 DIAGNOSIS — K219 Gastro-esophageal reflux disease without esophagitis: Secondary | ICD-10-CM | POA: Diagnosis not present

## 2021-06-20 MED ORDER — REPATHA SURECLICK 140 MG/ML ~~LOC~~ SOAJ: SUBCUTANEOUS | 3 refills | Status: DC

## 2021-06-25 ENCOUNTER — Encounter: Payer: Self-pay | Admitting: Cardiology

## 2021-06-25 ENCOUNTER — Ambulatory Visit: Payer: Medicare HMO | Admitting: Cardiology

## 2021-06-25 ENCOUNTER — Other Ambulatory Visit: Payer: Self-pay

## 2021-06-25 VITALS — BP 152/69 | HR 96 | Temp 97.9°F | Resp 17 | Ht 72.0 in | Wt 203.4 lb

## 2021-06-25 DIAGNOSIS — R0789 Other chest pain: Secondary | ICD-10-CM

## 2021-06-25 DIAGNOSIS — I2510 Atherosclerotic heart disease of native coronary artery without angina pectoris: Secondary | ICD-10-CM

## 2021-06-25 NOTE — Progress Notes (Signed)
Primary Physician/Referring:  Perrin Maltese, MD  Patient ID: [name] [name], male    DOB: August 18, 1935, 85 y.o.   MRN: [id number]  Chief Complaint  Patient presents with   Hypotension   Coronary artery disease involving native coronary artery of   HPI:    [name] [name]  is a 85 y.o. Caucasian male with known CAD, hyperlipidemia, hypertension and esophageal stricture S/P dilatation remotely, multiple coronary interventions and eventually underwent CABG 2 in 2010.  For NSTEMI, he has had SVG to D2 stent in 2010 and again in January 2017, patent stents by angiography in January 2018.  He has had 2 coronary intervention to SVG to D2 with placement of the proximal stent in 2010 and mid segment stent in January 2017 when he presented with NSTEMI  in [place].  Patient presents today with concerns that he has had occasional episodes of elevated blood pressure as well as low heart rate. He also reports occasional episodes of chest pain both at rest and with exertion. Patient monitors his blood pressure and heart rate twice daily. He has noticed his heart rate is occasionally <50 bpm, lowest being 47 bpm. He has noticed systolic blood pressure occasionally elevated as high as 145 mmHg. He continues to walk 2-3 days per week without issue.   In regard to chest pain, patient reports brief episodes of chest discomfort which are relieved with sublingual nitroglycerin occurring 1-2 times per month.   Past Medical History:  Diagnosis Date   Arthritis    Cancer [facility]) 1995   prostate ca    Coronary artery disease    Esophageal stricture 08/2016   GERD (gastroesophageal reflux disease)    Glaucoma    History of hiatal hernia    Hypertension    MI (mitral incompetence)    Peripheral neuropathy    Peripheral vascular disease (HCC)    Vertigo    Past Surgical History:  Procedure Laterality Date   COLONOSCOPY     CORONARY ANGIOPLASTY WITH STENT PLACEMENT  2005   x9    CORONARY ARTERY BYPASS  GRAFT  2011   CORONARY STENT PLACEMENT  07/2014   ESOPHAGOGASTRODUODENOSCOPY     ESOPHAGOGASTRODUODENOSCOPY (EGD) WITH ESOPHAGEAL DILATION  2018   knee scope Bilateral    LEG SURGERY Right 09/2018   LIPOMA EXCISION N/A 09/24/2016   Procedure: EXCISION SUBCUTANEOUS LIPOMA ON UPPER BACK;  Surgeon: Donnie Mesa, MD;  Location: [place];  Service: General;  Laterality: N/A;   [place] Left 09/25/2017   REVERSE SHOULDER ARTHROPLASTY Left 09/25/2017   Procedure: LEFT REVERSE SHOULDER ARTHROPLASTY; deltoid repair;  Surgeon: Netta Cedars, MD;  Location: [place];  Service: Orthopedics;  Laterality: Left;   ROTATOR CUFF REPAIR Left 2001   Family History  Problem Relation Age of Onset   Alzheimer's disease Mother    Stroke Father    Parkinson's disease Sister    Cancer Sister    Obesity Sister    Diabetes Mellitus II Sister    Social History   Tobacco Use   Smoking status: Never   Smokeless tobacco: Never  Substance Use Topics   Alcohol use: Yes    Alcohol/week: 7.0 standard drinks    Types: 7 Glasses of wine per week    Comment: wine with dinner    Marital Status: Single ROS  Review of Systems  Eyes:  Positive for blurred vision (macular degeneration).  Cardiovascular:  Positive for chest pain and  leg swelling (intermittent). Negative for dyspnea on exertion.  Musculoskeletal:  Positive for back pain.  Gastrointestinal:  Negative for melena.  Neurological:  Positive for disturbances in coordination, numbness and paresthesias. Negative for dizziness.   Objective  Blood pressure (!) 152/69, pulse 96, temperature 97.9 F (36.6 C), temperature source Temporal, resp. rate 17, height 6' (1.829 m), weight 203 lb 6.4 oz (92.3 kg), SpO2 96 %.  Vitals with BMI 06/25/2021 12/31/2020 08/08/2020  Height 6' 0" 6' 0" 6' 0"  Weight 203 lbs 6 oz 203 lbs 13 oz 200 lbs  BMI 27.58 31.51 76.16  Systolic 073 710 -  Diastolic 69 67 -  Pulse 96 68 -    No data  found.   Physical Exam Constitutional:      Appearance: He is well-developed.  Neck:     Vascular: Carotid bruit (bilateral soft bruit) present. No JVD.  Cardiovascular:     Rate and Rhythm: Normal rate and regular rhythm.     Pulses: Intact distal pulses.          Femoral pulses are 2+ on the right side and 2+ on the left side.      Dorsalis pedis pulses are 0 on the right side and 0 on the left side.       Posterior tibial pulses are 0 on the right side and 0 on the left side.     Heart sounds: Normal heart sounds. No murmur heard.   No gallop.  Pulmonary:     Effort: Pulmonary effort is normal.     Breath sounds: Normal breath sounds.  Abdominal:     General: Bowel sounds are normal.     Palpations: Abdomen is soft.  Musculoskeletal:        General: No swelling.   Laboratory examination:   Recent Labs    06/29/20 0828 07/06/20 1148  NA 140 137  K 4.4 4.2  CL 105 103  CO2 23 27  GLUCOSE 85 94  BUN 24 16  CREATININE 0.91 0.92  CALCIUM 9.1 8.8*  GFRNONAA 78 >60  GFRAA 90  --    CrCl cannot be calculated (Patient's most recent lab result is older than the maximum 21 days allowed.).  CMP Latest Ref Rng & Units 07/06/2020 06/29/2020 12/23/2019  Glucose 70 - 99 mg/dL 94 85 111(H)  BUN 8 - 23 mg/dL _0 Creatinine 0.61 - 1.24 mg/dL 0.92 0.91 0.91  Sodium 135 - 145 mmol/L 137 140 135  Potassium 3.5 - 5.1 mmol/L 4.2 4.4 4.0  Chloride 98 - 111 mmol/L 103 105 102  CO2 22 - 32 mmol/L _1 Calcium 8.9 - 10.3 mg/dL 8.8(L) 9.1 8.9  Total Protein 6.5 - 8.1 g/dL 7.3 6.1 6.8  Total Bilirubin 0.3 - 1.2 mg/dL 0.6 0.4 0.8  Alkaline Phos 38 - 126 U/L 50 52 44  AST 15 - 41 U/L 12(L) 9 16  ALT 0 - 44 U/L _2 CBC Latest Ref Rng & Units 07/06/2020 06/29/2020 12/23/2019  WBC 4.0 - 10.5 K/uL 7.4 5.2 8.1  Hemoglobin 13.0 - 17.0 g/dL 13.0 13.1 13.2  Hematocrit 39.0 - 52.0 % 39.1 39.5 40.0  Platelets 150 - 400 K/uL 188 172 182   Lipid Panel Recent Labs     06/29/20 0827 02/18/21 0808 02/18/21 0811  CHOL 147  --  134  TRIG 143  --  59  LDLCALC 82  --  62  HDL 40  --  59  LDLDIRECT  --  62  --     External labs  09/12/2019:  Hb 13.4/15.6, platelets 178, normal indicis. Serum glucose 96 mg, BMP normal N, EGFR >60 mL. A1c 5.4%.  TSH normal. Total cholesterol 164, triglycerides 171, HDL 47, LDL 88. Vitamin D 36.4.  BNP 93.0 08/13/2019   Hb 12.8/HCT 37.3, platelets 144.  08/13/2019   Allergies   Allergies  Allergen Reactions   Docosahexaenoic Acid (Dha) Diarrhea   Lovaza [Omega-3-Acid Ethyl Esters] Diarrhea   Atorvastatin Other (See Comments)    Joint pain   Rosuvastatin Other (See Comments)    Joint pain   Statins Other (See Comments)    Leg pain    Folic Acid-Vit [id number] Anxiety, Itching and Nausea Only   Hydrocodone Rash   Pravastatin Nausea Only    Medications Prior to Visit:   Outpatient Medications Prior to Visit  Medication Sig Dispense Refill   acetaminophen (TYLENOL) 650 MG CR tablet Take 650 mg by mouth as needed for pain. 2 tablets in the morning and 2 tablets at bedtime     amLODipine (NORVASC) 10 MG tablet Take 1 tablet (10 mg total) by mouth daily. 90 tablet 3   aspirin EC 81 MG tablet Take 81 mg by mouth daily.     azelastine (OPTIVAR) 0.05 % ophthalmic solution 1 drop 2 (two) times daily     calcium carbonate (TUMS - DOSED IN MG ELEMENTAL CALCIUM) 500 MG chewable tablet Chew 2 tablets by mouth daily as needed for indigestion or heartburn.     cholecalciferol (VITAMIN D) 1000 units tablet Take 1,000 Units by mouth daily.     Cyanocobalamin (B-12) 1000 MCG/ML KIT Inject 1 mL as directed every 30 (thirty) days.     cycloSPORINE (RESTASIS) 0.05 % ophthalmic emulsion Place 1 drop into both eyes 2 (two) times daily.     Evolocumab (REPATHA SURECLICK) 355 MG/ML SOAJ ADMINISTER 1 ML UNDER THE SKIN EVERY 14 DAYS 2 mL 3   fenofibrate (TRICOR) 145 MG tablet Take 1 tablet (145 mg total) by mouth daily. 90 tablet 3    fluticasone (FLONASE) 50 MCG/ACT nasal spray Place 1-2 sprays into both nostrils as needed.     latanoprost (XALATAN) 0.005 % ophthalmic solution Place 1 drop into both eyes at bedtime.  0   levocetirizine (XYZAL) 5 MG tablet      lisinopril (PRINIVIL,ZESTRIL) 20 MG tablet Take 20 mg by mouth 2 (two) times daily.      meclizine (ANTIVERT) 12.5 MG tablet Take 1 tablet by mouth daily.     metoprolol tartrate (LOPRESSOR) 25 MG tablet TAKE 1/2 TABLET BY MOUTH TWICE DAILY 180 tablet 3   nitroGLYCERIN (NITROSTAT) 0.4 MG SL tablet Place 0.4 mg under the tongue every 5 (five) minutes as needed for chest pain.     nystatin-triamcinolone ointment (MYCOLOG) APPLY TOPICALLY TO THE AFFECTED AREA TWICE DAILY     ondansetron (ZOFRAN-ODT) 4 MG disintegrating tablet ondansetron 4 mg disintegrating tablet  DISSOLVE 1 TABLET ON THE TONGUE EVERY 8 HOURS AS NEEDED FOR NAUSEA     pantoprazole (PROTONIX) 40 MG tablet Take 40 mg by mouth every morning.      Polyethyl Glycol-Propyl Glycol (SYSTANE OP) Apply 1 drop to eye 5 (five) times daily as needed (dry eyes).     pregabalin (LYRICA) 50 MG capsule Take 50 mg by mouth in the morning and at bedtime.     zolpidem (AMBIEN) 10 MG tablet Take 10 mg by mouth at  bedtime.     cetirizine (ZYRTEC) 10 MG tablet      cycloSPORINE (RESTASIS) 0.05 % ophthalmic emulsion Place 1-2 drops into both eyes daily.     Infant Care Products (BABY SHAMPOO EX) Rub into a wash cloth and rub into the eyes 3 to 4 times daily for dry eyes / eye build up     pantoprazole (PROTONIX) 20 MG tablet Take by mouth.     No facility-administered medications prior to visit.   Final Medications at End of Visit    Current Meds  Medication Sig   acetaminophen (TYLENOL) 650 MG CR tablet Take 650 mg by mouth as needed for pain. 2 tablets in the morning and 2 tablets at bedtime   amLODipine (NORVASC) 10 MG tablet Take 1 tablet (10 mg total) by mouth daily.   aspirin EC 81 MG tablet Take 81 mg by mouth  daily.   azelastine (OPTIVAR) 0.05 % ophthalmic solution 1 drop 2 (two) times daily   calcium carbonate (TUMS - DOSED IN MG ELEMENTAL CALCIUM) 500 MG chewable tablet Chew 2 tablets by mouth daily as needed for indigestion or heartburn.   cholecalciferol (VITAMIN D) 1000 units tablet Take 1,000 Units by mouth daily.   Cyanocobalamin (B-12) 1000 MCG/ML KIT Inject 1 mL as directed every 30 (thirty) days.   cycloSPORINE (RESTASIS) 0.05 % ophthalmic emulsion Place 1 drop into both eyes 2 (two) times daily.   Evolocumab (REPATHA SURECLICK) 678 MG/ML SOAJ ADMINISTER 1 ML UNDER THE SKIN EVERY 14 DAYS   fenofibrate (TRICOR) 145 MG tablet Take 1 tablet (145 mg total) by mouth daily.   fluticasone (FLONASE) 50 MCG/ACT nasal spray Place 1-2 sprays into both nostrils as needed.   latanoprost (XALATAN) 0.005 % ophthalmic solution Place 1 drop into both eyes at bedtime.   levocetirizine (XYZAL) 5 MG tablet    lisinopril (PRINIVIL,ZESTRIL) 20 MG tablet Take 20 mg by mouth 2 (two) times daily.    meclizine (ANTIVERT) 12.5 MG tablet Take 1 tablet by mouth daily.   metoprolol tartrate (LOPRESSOR) 25 MG tablet TAKE 1/2 TABLET BY MOUTH TWICE DAILY   nitroGLYCERIN (NITROSTAT) 0.4 MG SL tablet Place 0.4 mg under the tongue every 5 (five) minutes as needed for chest pain.   nystatin-triamcinolone ointment (MYCOLOG) APPLY TOPICALLY TO THE AFFECTED AREA TWICE DAILY   ondansetron (ZOFRAN-ODT) 4 MG disintegrating tablet ondansetron 4 mg disintegrating tablet  DISSOLVE 1 TABLET ON THE TONGUE EVERY 8 HOURS AS NEEDED FOR NAUSEA   pantoprazole (PROTONIX) 40 MG tablet Take 40 mg by mouth every morning.    Polyethyl Glycol-Propyl Glycol (SYSTANE OP) Apply 1 drop to eye 5 (five) times daily as needed (dry eyes).   pregabalin (LYRICA) 50 MG capsule Take 50 mg by mouth in the morning and at bedtime.   zolpidem (AMBIEN) 10 MG tablet Take 10 mg by mouth at bedtime.   Radiology:   CT of the abdomen and pelvis  08/28/2020: Noncontrast CT, abdominal exam revealing normal liver, spleen, pancreas, adrenal glands.  Left kidney is normal. Low-density lesion in the right kidney consistent with a 3 mm exophytic hyperdense lesion in the kidney consistent with hyperdense cyst.   No pathologic lymphadenopathy. Atherosclerosis of the aorta and iliac vessels without aneurysm.  Mild coronary calcification. No significant change from 07/06/2020.  Cardiac Studies:   Coronary Angiogram  07/24/2015: ( repeat cath 07/29/2016:) Prox LAD: 100% stenosis, LIMA to LAD graft patent, Mid RCA: 20% stenosis; SVG to D2: 100% stenosis in the middle third  of the graft, s/p stenting with 3.5 x 33 Promus premier DES. Stent in the proximal graft, 3.5x33 mm Xience DES placed March 2014 widely patent. Diffuse native vessel disease. Codominant system. Normal LVEF.    Zio Patch Extended out patient EKG monitoring 7 days 02/27/2020 through 03/05/2020:  There were 47 patient triggered activities, 7 dairy events correlating normal sinus rhythm, PACs and PVCs. Predominant rhythm is normal sinus rhythm.  There was 1 episode of 4 beat NSVT at 6:25 AM.  There are brief atrial tachycardia, fastest 6 beats at 152 bpm and longest 20 beats at 95 bpm which was symptomatic. No atrial fibrillation, no heart block.  Rare PVC noted.  Carotid artery duplex 05/02/2021 Stenosis in the right internal carotid artery (1-15%). Stenosis in the right external carotid artery (<50%). Peak systolic velocities in the left bifurcation, internal, external and common carotid arteries are within normal limits. Both vertebral arteries are patent with antegrade flow. No prior studies for comparison. Follow up in one year is appropriate if clinically indicated.  Echocardiogram 08/02/2020: Normal LV systolic function with visual EF 55-60%. Left ventricle cavity is normal in size. Mild left ventricular hypertrophy. Normal global wall motion. Indeterminate diastolic filling  pattern. Left atrial cavity is mildly dilated. Mild (Grade I) aortic regurgitation. Mild (Grade I) mitral regurgitation. Mild tricuspid regurgitation. No evidence of pulmonary hypertension. Compared to prior study dated 02/06/2016: no significant changes.   EKG: 06/25/2021: Sinus rhythm with first-degree AV block at a rate of 61 bpm.  Inferior infarct old.  Nonspecific T wave abnormality.  12/31/2020: Marked sinus bradycardia at rate of 54 bpm with first-degree AV block, inferior infarct old.  Anteroseptal infarct old.  Nonspecific T abnormality.  No significant change from 06/28/2020.  Assessment     ICD-10-CM   1. Coronary artery disease involving native coronary artery of native heart without angina pectoris  I25.10 EKG 12-Lead    PCV MYOCARDIAL PERFUSION WO LEXISCAN    2. Atypical chest pain  R07.89 PCV MYOCARDIAL PERFUSION WO LEXISCAN      No orders of the defined types were placed in this encounter.   Medications Discontinued During This Encounter  Medication Reason   cycloSPORINE (RESTASIS) 0.05 % ophthalmic emulsion    pantoprazole (PROTONIX) 20 MG tablet    Infant Care Products (BABY SHAMPOO EX)    cetirizine (ZYRTEC) 10 MG tablet    Recommendations:   [name] [name]  is a 85 y.o. Caucasian male with known CAD, hyperlipidemia, hypertension and esophageal stricture S/P dilatation remotely, multiple coronary interventions and eventually underwent CABG 2 in 2010.  For NSTEMI, he has had SVG to D2 stent in 2010 and again in January 2017, patent stents by angiography in January 2018.  He has severe peripheral neuropathy, chronic back pain and statin intolerance.  Patient presents for visit with concerns about blood pressure and heart rate as well as complaints of chest pain. Patient is orthostatic in the offcie today, althought denies symptoms. Given orthostasis patient's blood pressure is within acceptable range. In regard to heart rate, discussed monitor however patient  reports these episodes do not happen regularly therefore shared decision was hold off on monitor. Given history of CAD and present chest pain would recommend nuclear stress test. Would also recommend treadmill stress test to assess chronotropic competence as well. Will not make changes at this time.   Follow up as previously scheduled.   Patient was seen in collaboration with Dr. Einar Gip and he is in agreement with plan.  Alethia Berthold, PA-C 06/28/2021, 4:11 PM Office: [phone]

## 2021-07-05 DIAGNOSIS — D519 Vitamin B12 deficiency anemia, unspecified: Secondary | ICD-10-CM | POA: Diagnosis not present

## 2021-07-05 DIAGNOSIS — R051 Acute cough: Secondary | ICD-10-CM | POA: Diagnosis not present

## 2021-07-05 DIAGNOSIS — J069 Acute upper respiratory infection, unspecified: Secondary | ICD-10-CM | POA: Diagnosis not present

## 2021-07-06 DIAGNOSIS — R079 Chest pain, unspecified: Secondary | ICD-10-CM | POA: Diagnosis not present

## 2021-07-06 DIAGNOSIS — S301XXA Contusion of abdominal wall, initial encounter: Secondary | ICD-10-CM | POA: Diagnosis not present

## 2021-07-06 DIAGNOSIS — S2241XA Multiple fractures of ribs, right side, initial encounter for closed fracture: Secondary | ICD-10-CM | POA: Diagnosis not present

## 2021-07-06 DIAGNOSIS — S7001XA Contusion of right hip, initial encounter: Secondary | ICD-10-CM | POA: Diagnosis not present

## 2021-07-06 DIAGNOSIS — Z041 Encounter for examination and observation following transport accident: Secondary | ICD-10-CM | POA: Diagnosis not present

## 2021-07-06 DIAGNOSIS — S3991XA Unspecified injury of abdomen, initial encounter: Secondary | ICD-10-CM | POA: Diagnosis not present

## 2021-07-06 DIAGNOSIS — R001 Bradycardia, unspecified: Secondary | ICD-10-CM | POA: Diagnosis not present

## 2021-07-06 DIAGNOSIS — S069X9A Unspecified intracranial injury with loss of consciousness of unspecified duration, initial encounter: Secondary | ICD-10-CM | POA: Diagnosis not present

## 2021-07-06 DIAGNOSIS — S0990XA Unspecified injury of head, initial encounter: Secondary | ICD-10-CM | POA: Diagnosis not present

## 2021-07-06 DIAGNOSIS — Z951 Presence of aortocoronary bypass graft: Secondary | ICD-10-CM | POA: Diagnosis not present

## 2021-07-06 DIAGNOSIS — I10 Essential (primary) hypertension: Secondary | ICD-10-CM | POA: Diagnosis not present

## 2021-07-06 DIAGNOSIS — M47812 Spondylosis without myelopathy or radiculopathy, cervical region: Secondary | ICD-10-CM | POA: Diagnosis not present

## 2021-07-06 DIAGNOSIS — J9811 Atelectasis: Secondary | ICD-10-CM | POA: Diagnosis not present

## 2021-07-06 DIAGNOSIS — S199XXA Unspecified injury of neck, initial encounter: Secondary | ICD-10-CM | POA: Diagnosis not present

## 2021-07-06 DIAGNOSIS — I2510 Atherosclerotic heart disease of native coronary artery without angina pectoris: Secondary | ICD-10-CM | POA: Diagnosis not present

## 2021-07-06 DIAGNOSIS — E785 Hyperlipidemia, unspecified: Secondary | ICD-10-CM | POA: Diagnosis not present

## 2021-07-06 DIAGNOSIS — M25561 Pain in right knee: Secondary | ICD-10-CM | POA: Diagnosis not present

## 2021-07-06 DIAGNOSIS — Z043 Encounter for examination and observation following other accident: Secondary | ICD-10-CM | POA: Diagnosis not present

## 2021-07-06 DIAGNOSIS — R0789 Other chest pain: Secondary | ICD-10-CM | POA: Diagnosis not present

## 2021-07-06 DIAGNOSIS — R000 Tachycardia, unspecified: Secondary | ICD-10-CM | POA: Diagnosis not present

## 2021-07-06 DIAGNOSIS — S2249XA Multiple fractures of ribs, unspecified side, initial encounter for closed fracture: Secondary | ICD-10-CM | POA: Diagnosis not present

## 2021-07-06 DIAGNOSIS — S29009A Unspecified injury of muscle and tendon of unspecified wall of thorax, initial encounter: Secondary | ICD-10-CM | POA: Diagnosis not present

## 2021-07-11 DIAGNOSIS — H353211 Exudative age-related macular degeneration, right eye, with active choroidal neovascularization: Secondary | ICD-10-CM | POA: Diagnosis not present

## 2021-07-11 DIAGNOSIS — H2513 Age-related nuclear cataract, bilateral: Secondary | ICD-10-CM | POA: Diagnosis not present

## 2021-07-11 DIAGNOSIS — H43813 Vitreous degeneration, bilateral: Secondary | ICD-10-CM | POA: Diagnosis not present

## 2021-07-11 DIAGNOSIS — H353122 Nonexudative age-related macular degeneration, left eye, intermediate dry stage: Secondary | ICD-10-CM | POA: Diagnosis not present

## 2021-07-12 DIAGNOSIS — M898X1 Other specified disorders of bone, shoulder: Secondary | ICD-10-CM | POA: Diagnosis not present

## 2021-07-12 DIAGNOSIS — Z951 Presence of aortocoronary bypass graft: Secondary | ICD-10-CM | POA: Diagnosis not present

## 2021-07-12 DIAGNOSIS — I2510 Atherosclerotic heart disease of native coronary artery without angina pectoris: Secondary | ICD-10-CM | POA: Diagnosis not present

## 2021-07-12 DIAGNOSIS — Z9889 Other specified postprocedural states: Secondary | ICD-10-CM | POA: Diagnosis not present

## 2021-07-12 DIAGNOSIS — I10 Essential (primary) hypertension: Secondary | ICD-10-CM | POA: Diagnosis not present

## 2021-07-12 DIAGNOSIS — M19012 Primary osteoarthritis, left shoulder: Secondary | ICD-10-CM | POA: Diagnosis not present

## 2021-07-12 DIAGNOSIS — E782 Mixed hyperlipidemia: Secondary | ICD-10-CM | POA: Diagnosis not present

## 2021-07-12 DIAGNOSIS — Z96612 Presence of left artificial shoulder joint: Secondary | ICD-10-CM | POA: Diagnosis not present

## 2021-07-12 DIAGNOSIS — Z043 Encounter for examination and observation following other accident: Secondary | ICD-10-CM | POA: Diagnosis not present

## 2021-07-12 DIAGNOSIS — S40012A Contusion of left shoulder, initial encounter: Secondary | ICD-10-CM | POA: Diagnosis not present

## 2021-07-12 DIAGNOSIS — S2231XD Fracture of one rib, right side, subsequent encounter for fracture with routine healing: Secondary | ICD-10-CM | POA: Diagnosis not present

## 2021-07-29 ENCOUNTER — Other Ambulatory Visit: Payer: Self-pay | Admitting: Cardiology

## 2021-07-29 ENCOUNTER — Other Ambulatory Visit: Payer: Self-pay

## 2021-07-29 MED ORDER — NITROGLYCERIN 0.4 MG SL SUBL: 0.4000 mg | SUBLINGUAL_TABLET | SUBLINGUAL | 3 refills | Status: DC | PRN

## 2021-07-31 DIAGNOSIS — R1310 Dysphagia, unspecified: Secondary | ICD-10-CM | POA: Diagnosis not present

## 2021-07-31 DIAGNOSIS — K862 Cyst of pancreas: Secondary | ICD-10-CM | POA: Diagnosis not present

## 2021-07-31 DIAGNOSIS — K219 Gastro-esophageal reflux disease without esophagitis: Secondary | ICD-10-CM | POA: Diagnosis not present

## 2021-08-01 ENCOUNTER — Other Ambulatory Visit: Payer: Self-pay | Admitting: Gastroenterology

## 2021-08-01 DIAGNOSIS — K862 Cyst of pancreas: Secondary | ICD-10-CM

## 2021-08-08 DIAGNOSIS — E782 Mixed hyperlipidemia: Secondary | ICD-10-CM | POA: Diagnosis not present

## 2021-08-08 DIAGNOSIS — I340 Nonrheumatic mitral (valve) insufficiency: Secondary | ICD-10-CM | POA: Diagnosis not present

## 2021-08-08 DIAGNOSIS — I10 Essential (primary) hypertension: Secondary | ICD-10-CM | POA: Diagnosis not present

## 2021-08-08 DIAGNOSIS — I2510 Atherosclerotic heart disease of native coronary artery without angina pectoris: Secondary | ICD-10-CM | POA: Diagnosis not present

## 2021-08-08 DIAGNOSIS — I219 Acute myocardial infarction, unspecified: Secondary | ICD-10-CM | POA: Diagnosis not present

## 2021-08-08 DIAGNOSIS — I351 Nonrheumatic aortic (valve) insufficiency: Secondary | ICD-10-CM | POA: Diagnosis not present

## 2021-08-09 DIAGNOSIS — K2100 Gastro-esophageal reflux disease with esophagitis, without bleeding: Secondary | ICD-10-CM | POA: Diagnosis not present

## 2021-08-09 DIAGNOSIS — E538 Deficiency of other specified B group vitamins: Secondary | ICD-10-CM | POA: Diagnosis not present

## 2021-08-09 DIAGNOSIS — I10 Essential (primary) hypertension: Secondary | ICD-10-CM | POA: Diagnosis not present

## 2021-08-09 DIAGNOSIS — K219 Gastro-esophageal reflux disease without esophagitis: Secondary | ICD-10-CM | POA: Diagnosis not present

## 2021-08-09 DIAGNOSIS — E782 Mixed hyperlipidemia: Secondary | ICD-10-CM | POA: Diagnosis not present

## 2021-08-09 DIAGNOSIS — M542 Cervicalgia: Secondary | ICD-10-CM | POA: Diagnosis not present

## 2021-08-22 DIAGNOSIS — H353211 Exudative age-related macular degeneration, right eye, with active choroidal neovascularization: Secondary | ICD-10-CM | POA: Diagnosis not present

## 2021-08-22 DIAGNOSIS — H353122 Nonexudative age-related macular degeneration, left eye, intermediate dry stage: Secondary | ICD-10-CM | POA: Diagnosis not present

## 2021-08-23 ENCOUNTER — Other Ambulatory Visit: Payer: Medicare HMO

## 2021-08-27 ENCOUNTER — Ambulatory Visit
Admission: RE | Admit: 2021-08-27 | Discharge: 2021-08-27 | Disposition: A | Discharge status: Home / Self Care | Payer: Medicare HMO | Source: Ambulatory Visit | Attending: Gastroenterology | Admitting: Gastroenterology

## 2021-08-27 DIAGNOSIS — K862 Cyst of pancreas: Secondary | ICD-10-CM

## 2021-08-27 DIAGNOSIS — D1809 Hemangioma of other sites: Secondary | ICD-10-CM | POA: Diagnosis not present

## 2021-08-27 DIAGNOSIS — K8689 Other specified diseases of pancreas: Secondary | ICD-10-CM | POA: Diagnosis not present

## 2021-08-27 MED ORDER — GADOBENATE DIMEGLUMINE 529 MG/ML IV SOLN
19.0000 mL | Freq: Once | INTRAVENOUS | Status: AC | PRN
  Administered 2021-08-27: 19 mL via INTRAVENOUS

## 2021-08-28 DIAGNOSIS — I10 Essential (primary) hypertension: Secondary | ICD-10-CM | POA: Diagnosis not present
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
## 2021-09-06 ENCOUNTER — Other Ambulatory Visit: Payer: Self-pay

## 2021-09-06 ENCOUNTER — Ambulatory Visit: Payer: Medicare HMO | Admitting: Urology

## 2021-09-06 ENCOUNTER — Other Ambulatory Visit
Admission: RE | Admit: 2021-09-06 | Discharge: 2021-09-06 | Disposition: A | Discharge status: Home / Self Care | Payer: Medicare HMO | Attending: Urology | Admitting: Urology

## 2021-09-06 VITALS — BP 147/63 | HR 64 | Ht 72.0 in | Wt 202.0 lb

## 2021-09-06 DIAGNOSIS — N393 Stress incontinence (female) (male): Secondary | ICD-10-CM

## 2021-09-06 DIAGNOSIS — C61 Malignant neoplasm of prostate: Secondary | ICD-10-CM | POA: Diagnosis not present

## 2021-09-06 LAB — URINALYSIS, COMPLETE (UACMP) WITH MICROSCOPIC
Bacteria, UA: NONE SEEN
Bilirubin Urine: NEGATIVE
Glucose, UA: NEGATIVE mg/dL
Hgb urine dipstick: NEGATIVE
Ketones, ur: NEGATIVE mg/dL
Leukocytes,Ua: NEGATIVE
Nitrite: NEGATIVE
Protein, ur: NEGATIVE mg/dL
RBC / HPF: NONE SEEN RBC/hpf (ref 0–5)
Specific Gravity, Urine: 1.02 (ref 1.005–1.030)
Squamous Epithelial / HPF: NONE SEEN (ref 0–5)
WBC, UA: NONE SEEN WBC/hpf (ref 0–5)
pH: 6 (ref 5.0–8.0)

## 2021-09-06 LAB — BLADDER SCAN AMB NON-IMAGING: Scan Result: 81

## 2021-09-06 NOTE — Patient Instructions (Signed)
    [email or web address]  The [facility] Incontinence Clamp is an external medical device used to control urine leakage by compressing the urethra and preventing the flow of urine   Lets you maintain your active lifestyle Cost effective, saving you lots of money on adult incontinence briefs Can be worn during any activity Ergonomic design promotes confidence and provides all-day comfort    Step 3 Latch the incontinence clamp to compress the  urethra at the level that's comfortable to you         Step 2 Place your penis between the silicone rubber pads with the  incontinence clamp about halfway down the shaft of your penis Step 1 Open the incontinence clamp by  releasing the catch and lifting up the top part  

## 2021-09-09 DIAGNOSIS — D519 Vitamin B12 deficiency anemia, unspecified: Secondary | ICD-10-CM | POA: Diagnosis not present

## 2021-09-11 DIAGNOSIS — I2510 Atherosclerotic heart disease of native coronary artery without angina pectoris: Secondary | ICD-10-CM | POA: Diagnosis not present

## 2021-09-13 DIAGNOSIS — I2510 Atherosclerotic heart disease of native coronary artery without angina pectoris: Secondary | ICD-10-CM | POA: Diagnosis not present

## 2021-09-13 DIAGNOSIS — R079 Chest pain, unspecified: Secondary | ICD-10-CM | POA: Diagnosis not present

## 2021-09-13 DIAGNOSIS — E782 Mixed hyperlipidemia: Secondary | ICD-10-CM | POA: Diagnosis not present

## 2021-09-13 DIAGNOSIS — I219 Acute myocardial infarction, unspecified: Secondary | ICD-10-CM | POA: Diagnosis not present

## 2021-09-13 DIAGNOSIS — I340 Nonrheumatic mitral (valve) insufficiency: Secondary | ICD-10-CM | POA: Diagnosis not present

## 2021-09-13 DIAGNOSIS — I351 Nonrheumatic aortic (valve) insufficiency: Secondary | ICD-10-CM | POA: Diagnosis not present

## 2021-09-13 DIAGNOSIS — I10 Essential (primary) hypertension: Secondary | ICD-10-CM | POA: Diagnosis not present

## 2021-09-19 DIAGNOSIS — R072 Precordial pain: Secondary | ICD-10-CM | POA: Diagnosis not present

## 2021-09-20 DIAGNOSIS — I2510 Atherosclerotic heart disease of native coronary artery without angina pectoris: Secondary | ICD-10-CM | POA: Diagnosis not present

## 2021-09-20 DIAGNOSIS — R079 Chest pain, unspecified: Secondary | ICD-10-CM | POA: Diagnosis not present

## 2021-09-20 DIAGNOSIS — I10 Essential (primary) hypertension: Secondary | ICD-10-CM | POA: Diagnosis not present

## 2021-09-20 DIAGNOSIS — I340 Nonrheumatic mitral (valve) insufficiency: Secondary | ICD-10-CM | POA: Diagnosis not present

## 2021-09-20 DIAGNOSIS — E782 Mixed hyperlipidemia: Secondary | ICD-10-CM | POA: Diagnosis not present

## 2021-09-20 DIAGNOSIS — I219 Acute myocardial infarction, unspecified: Secondary | ICD-10-CM | POA: Diagnosis not present

## 2021-09-20 DIAGNOSIS — I351 Nonrheumatic aortic (valve) insufficiency: Secondary | ICD-10-CM | POA: Diagnosis not present

## 2021-09-26 ENCOUNTER — Encounter: Payer: Self-pay | Admitting: Physical Therapy

## 2021-09-26 ENCOUNTER — Ambulatory Visit: Payer: Medicare HMO | Attending: Urology | Admitting: Physical Therapy

## 2021-09-26 ENCOUNTER — Other Ambulatory Visit: Payer: Self-pay

## 2021-09-26 DIAGNOSIS — N393 Stress incontinence (female) (male): Secondary | ICD-10-CM | POA: Diagnosis not present

## 2021-09-26 DIAGNOSIS — R278 Other lack of coordination: Secondary | ICD-10-CM | POA: Insufficient documentation

## 2021-09-26 DIAGNOSIS — M6281 Muscle weakness (generalized): Secondary | ICD-10-CM | POA: Insufficient documentation

## 2021-09-26 DIAGNOSIS — R293 Abnormal posture: Secondary | ICD-10-CM | POA: Insufficient documentation
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
## 2021-09-29 ENCOUNTER — Other Ambulatory Visit: Payer: Self-pay

## 2021-09-29 ENCOUNTER — Ambulatory Visit
Admission: EM | Admit: 2021-09-29 | Discharge: 2021-09-29 | Disposition: A | Discharge status: Home / Self Care | Payer: Medicare HMO | Attending: Physician Assistant | Admitting: Physician Assistant

## 2021-09-29 DIAGNOSIS — N3001 Acute cystitis with hematuria: Secondary | ICD-10-CM | POA: Diagnosis not present

## 2021-09-29 LAB — URINALYSIS, ROUTINE W REFLEX MICROSCOPIC
Glucose, UA: NEGATIVE mg/dL
Nitrite: POSITIVE — AB
Protein, ur: >300 mg/dL — AB
Specific Gravity, Urine: 1.025 (ref 1.005–1.030)
pH: 5.5 (ref 5.0–8.0)

## 2021-09-29 LAB — URINALYSIS, MICROSCOPIC (REFLEX)

## 2021-09-29 MED ORDER — CEPHALEXIN 500 MG PO CAPS: 500.0000 mg | ORAL_CAPSULE | Freq: Two times a day (BID) | ORAL | 0 refills | Status: DC

## 2021-09-29 MED ORDER — PHENAZOPYRIDINE HCL 200 MG PO TABS: 200.0000 mg | ORAL_TABLET | Freq: Three times a day (TID) | ORAL | 0 refills | Status: DC

## 2021-09-29 NOTE — ED Triage Notes (Signed)
Pt c/o blood in urine starting this morning after using a Penile Clamp to stop incontinence.  ? ?Pt had prostate cancer in 1996. Pt states that he went to [facility] 2 weeks ago and had incontinence. Pt was told to use a clamp to stop the urination and that it would possibly caused a UTI. Pt is concerned about urinating blood and a UTI.  ? ?Pt denies any swelling or bruising along his penis. ?

## 2021-09-29 NOTE — Discharge Instructions (Addendum)
Take the keflex twice daily for 10 days with food for treatment of urinary tract infection. ? ?Use the Pyridium every 8 hours as needed for urinary discomfort.  This will turn your urine a bright red-orange. ? ?Increase your oral fluid intake so that you increase your urine production and or flushing your urinary system. ? ?Take an over-the-counter probiotic, such as Culturelle-Align-Activia, 1 hour after each dose of antibiotic to prevent diarrhea or yeast infections from forming. ? ?We will culture urine and change the antibiotics if necessary. ? ?Return for reevaluation, or see your primary care provider, for any new or worsening symptoms.  ? ?Call [facility] tomorrow and let them know you were here and are on antibiotics for a UTI. ?

## 2021-09-29 NOTE — ED Provider Notes (Signed)
?Friend ? ? ? ?CSN: [id number] ?Arrival date & time: 09/29/21  1140 ? ? ?  ? ?History   ?Chief Complaint ?Chief Complaint  ?Patient presents with  ? Urinary Tract Infection  ? ? ?HPI ?[name] [name] is a 86 y.o. male.  ? ?HPI ? ?86 year old male here for evaluation of urinary complaints. ? ?Patient reports that he has been experiencing urinary incontinence for approximately the last year.  He went to [facility] u[facility] last week and was advised to try using a penile clamp to help with the incontinence.  He states that he wore the clamp for approximately 4 to 5 days before developing burning with urination this morning and blood in his urine.  He reports that he passed 2 significantly long blood clots this morning when he got up and it made him concerned so he came in to be evaluated.  He has not had a fever, back pain, abdominal pain, nausea, or vomiting.  Patient does have a history of prostate cancer and had a prostatectomy in 1996. ? ?Past Medical History:  ?Diagnosis Date  ? Arthritis   ? Cancer [facility]) 1995  ? prostate ca   ? Coronary artery disease   ? Esophageal stricture 08/2016  ? GERD (gastroesophageal reflux disease)   ? Glaucoma   ? History of hiatal hernia   ? Hypertension   ? MI (mitral incompetence)   ? Peripheral neuropathy   ? Peripheral vascular disease ([place])   ? Vertigo   ? ? ?Patient Active Problem List  ? Diagnosis Date Noted  ? Exocrine pancreatic insufficiency 10/28/2019  ? Peripheral vascular disease ([place]) 10/28/2019  ? Absolute anemia 08/29/2019  ? Coronary artery disease 05/01/2019  ? Old myocardial infarction 05/01/2019  ? Tinnitus 05/01/2019  ? Other and unspecified peripheral vertigo 05/01/2019  ? Low back pain with left-sided sciatica 02/13/2019  ? Entrapment neuropathy of common peroneal nerve 07/20/2018  ? Blurring of visual image 05/28/2018  ? Glaucoma 02/24/2018  ? Hx of CABG 02/24/2018  ? Neuropathy 02/24/2018  ? Vitamin D deficiency 02/24/2018  ? Vitamin B12  deficiency 02/24/2018  ? Orthopedic aftercare 10/08/2017  ? S/P shoulder replacement, left 09/25/2017  ? Tear of left rotator cuff 08/04/2017  ? Osteoarthritis of left shoulder 08/04/2017  ? SUI (stress urinary incontinence), male 06/15/2017  ? Malignant neoplasm of prostate ([place]) 04/23/2017  ? Chest pain 07/27/2016  ? Gastroesophageal reflux disease without esophagitis 07/27/2016  ? Hyperlipidemia 07/22/2015  ? Osteoarthrosis involving lower leg 02/22/2014  ? Current tear of medial cartilage or meniscus of knee 11/11/2013  ? Insomnia 03/07/2013  ? Coronary artery disease involving coronary bypass graft of native heart with unstable angina pectoris ([place]) 10/06/2012  ? Coronary atherosclerosis 05/09/2009  ? Essential hypertension 05/09/2009  ? Benign neoplasm of colon 04/13/2008  ? Asymptomatic varicose veins 06/23/2007  ? Allergic rhinitis 12/28/2006  ? ? ?Past Surgical History:  ?Procedure Laterality Date  ? COLONOSCOPY    ? CORONARY ANGIOPLASTY WITH STENT PLACEMENT  2005  ? x9   ? CORONARY ARTERY BYPASS GRAFT  2011  ? CORONARY STENT PLACEMENT  07/2014  ? ESOPHAGOGASTRODUODENOSCOPY    ? ESOPHAGOGASTRODUODENOSCOPY (EGD) WITH ESOPHAGEAL DILATION  2018  ? knee scope Bilateral   ? LEG SURGERY Right 09/2018  ? LIPOMA EXCISION N/A 09/24/2016  ? Procedure: EXCISION SUBCUTANEOUS LIPOMA ON UPPER BACK;  Surgeon: Donnie Mesa, MD;  Location: [place];  Service: General;  Laterality: N/A;  ? Prostectomy  1995  ? REVERSE SHOULDER  ARTHROPLASTY Left 09/25/2017  ? REVERSE SHOULDER ARTHROPLASTY Left 09/25/2017  ? Procedure: LEFT REVERSE SHOULDER ARTHROPLASTY; deltoid repair;  Surgeon: Netta Cedars, MD;  Location: [place];  Service: Orthopedics;  Laterality: Left;  ? ROTATOR CUFF REPAIR Left 2001  ? ? ? ? ? ?Home Medications   ? ?Prior to Admission medications   ?Medication Sig Start Date End Date Taking? Authorizing Provider  ?acetaminophen (TYLENOL) 650 MG CR tablet Take 650 mg by mouth as needed for pain. 2 tablets in the morning and  2 tablets at bedtime   Yes [provider]  ?amLODipine (NORVASC) 10 MG tablet Take 1 tablet (10 mg total) by mouth daily. 09/27/19  Yes Adrian Prows, MD  ?aspirin EC 81 MG tablet Take 81 mg by mouth daily.   Yes [provider]  ?azelastine (OPTIVAR) 0.05 % ophthalmic solution 1 drop 2 (two) times daily 03/31/18  Yes [provider]  ?baclofen (LIORESAL) 10 MG tablet Take by mouth. 07/12/21  Yes [provider]  ?calcium carbonate (TUMS - DOSED IN MG ELEMENTAL CALCIUM) 500 MG chewable tablet Chew 2 tablets by mouth daily as needed for indigestion or heartburn.   Yes [provider]  ?celecoxib (CELEBREX) 200 MG capsule Take by mouth. 09/02/21  Yes [provider]  ?cephALEXin (KEFLEX) 500 MG capsule Take 1 capsule (500 mg total) by mouth 2 (two) times daily for 10 days. 09/29/21 10/09/21 Yes Margarette Canada, NP  ?cholecalciferol (VITAMIN D) 1000 units tablet Take 1,000 Units by mouth daily.   Yes [provider]  ?Cyanocobalamin (B-12) 1000 MCG/ML KIT Inject 1 mL as directed every 30 (thirty) days.   Yes [provider]  ?cycloSPORINE (RESTASIS) 0.05 % ophthalmic emulsion Place 1 drop into both eyes 2 (two) times daily.   Yes [provider]  ?fenofibrate (TRICOR) 145 MG tablet Take 1 tablet (145 mg total) by mouth daily. 12/31/20  Yes Adrian Prows, MD  ?fluticasone (FLONASE) 50 MCG/ACT nasal spray Place 1-2 sprays into both nostrils as needed. 04/18/20  Yes [provider]  ?ibuprofen (ADVIL) 600 MG tablet Take by mouth. 07/09/21  Yes [provider]  ?latanoprost (XALATAN) 0.005 % ophthalmic solution Place 1 drop into both eyes at bedtime. 08/22/17  Yes [provider]  ?levocetirizine (XYZAL) 5 MG tablet  05/03/20  Yes [provider]  ?lisinopril (PRINIVIL,ZESTRIL) 20 MG tablet Take 20 mg by mouth 2 (two) times daily.    Yes [provider]  ?meclizine (ANTIVERT) 12.5 MG tablet Take 1 tablet by mouth  daily. 03/24/20  Yes [provider]  ?metoprolol tartrate (LOPRESSOR) 25 MG tablet TAKE 1/2 TABLET BY MOUTH TWICE DAILY 11/05/20  Yes Adrian Prows, MD  ?nitroGLYCERIN (NITROSTAT) 0.4 MG SL tablet Place 1 tablet (0.4 mg total) under the tongue every 5 (five) minutes as needed for chest pain. 07/29/21  Yes Adrian Prows, MD  ?nystatin-triamcinolone ointment (MYCOLOG) APPLY TOPICALLY TO THE AFFECTED AREA TWICE DAILY 01/21/21  Yes [provider]  ?ondansetron (ZOFRAN-ODT) 4 MG disintegrating tablet ondansetron 4 mg disintegrating tablet ? DISSOLVE 1 TABLET ON THE TONGUE EVERY 8 HOURS AS NEEDED FOR NAUSEA   Yes [provider]  ?pantoprazole (PROTONIX) 40 MG tablet 1 tab(s) orally once a day for 90 06/24/17  Yes [provider]  ?phenazopyridine (PYRIDIUM) 200 MG tablet Take 1 tablet (200 mg total) by mouth 3 (three) times daily. 09/29/21  Yes Margarette Canada, NP  ?Polyethyl Glycol-Propyl Glycol (SYSTANE OP) Apply 1 drop to eye 5 (five)  times daily as needed (dry eyes).   Yes [provider]  ?pregabalin (LYRICA) 50 MG capsule Take 50 mg by mouth in the morning and at bedtime. 10/03/19  Yes [provider]  ?REPATHA SURECLICK 300 MG/ML SOAJ ADMINISTER 1 ML UNDER THE SKIN EVERY 14 DAYS 07/30/21  Yes Adrian Prows, MD  ?zolpidem (AMBIEN) 10 MG tablet Take 10 mg by mouth at bedtime.   Yes [provider]  ? ? ?Family History ?Family History  ?Problem Relation Age of Onset  ? Alzheimer's disease Mother   ? Stroke Father   ? Parkinson's disease Sister   ? Cancer Sister   ? Obesity Sister   ? Diabetes Mellitus II Sister   ? ? ?Social History ?Social History  ? ?Tobacco Use  ? Smoking status: Never  ? Smokeless tobacco: Never  ?Vaping Use  ? Vaping Use: Never used  ?Substance Use Topics  ? Alcohol use: Yes  ?  Alcohol/week: 7.0 standard drinks  ?  Types: 7 Glasses of wine per week  ?  Comment: wine with dinner  ? Drug use: No  ? ? ? ?Allergies   ?Docosahexaenoic acid (dha), Lovaza  [omega-3-acid ethyl esters], Atorvastatin, Rosuvastatin, Statins, Folic acid, Folic acid-vit B7-VMT N71, Hydrocodone, and Pravastatin ? ? ?Review of Systems ?Review of Systems  ?Constitutional:  Negative for fev

## 2021-10-01 DIAGNOSIS — I739 Peripheral vascular disease, unspecified: Secondary | ICD-10-CM | POA: Diagnosis not present

## 2021-10-01 DIAGNOSIS — M25561 Pain in right knee: Secondary | ICD-10-CM | POA: Diagnosis not present

## 2021-10-01 DIAGNOSIS — S8011XA Contusion of right lower leg, initial encounter: Secondary | ICD-10-CM | POA: Diagnosis not present

## 2021-10-01 DIAGNOSIS — S8012XA Contusion of left lower leg, initial encounter: Secondary | ICD-10-CM | POA: Diagnosis not present

## 2021-10-01 DIAGNOSIS — S8001XA Contusion of right knee, initial encounter: Secondary | ICD-10-CM | POA: Diagnosis not present

## 2021-10-01 DIAGNOSIS — S8002XA Contusion of left knee, initial encounter: Secondary | ICD-10-CM | POA: Diagnosis not present

## 2021-10-01 DIAGNOSIS — M170 Bilateral primary osteoarthritis of knee: Secondary | ICD-10-CM | POA: Diagnosis not present

## 2021-10-01 DIAGNOSIS — M25562 Pain in left knee: Secondary | ICD-10-CM | POA: Diagnosis not present

## 2021-10-01 LAB — URINE CULTURE: Culture: 80000 — AB

## 2021-10-02 ENCOUNTER — Telehealth: Payer: Self-pay

## 2021-10-02 ENCOUNTER — Telehealth (HOSPITAL_COMMUNITY): Payer: Self-pay | Admitting: Emergency Medicine

## 2021-10-02 MED ORDER — SULFAMETHOXAZOLE-TRIMETHOPRIM 800-160 MG PO TABS: 1.0000 | ORAL_TABLET | Freq: Two times a day (BID) | ORAL | 0 refills | Status: AC

## 2021-10-02 NOTE — Telephone Encounter (Signed)
Patient called stating he is having dysuria after using penile clamp. Pt was seen at the urgent care and treated. It looks like a new antibiotic was called in to pharmacy today for patient by urgent care. He states he is not feeling any better. I offered patient an appointment to be seen in our clinic which he declined.  ?

## 2021-10-03 ENCOUNTER — Encounter: Payer: Medicare HMO | Admitting: Physical Therapy

## 2021-10-03 DIAGNOSIS — H353122 Nonexudative age-related macular degeneration, left eye, intermediate dry stage: Secondary | ICD-10-CM | POA: Diagnosis not present

## 2021-10-03 DIAGNOSIS — H353211 Exudative age-related macular degeneration, right eye, with active choroidal neovascularization: Secondary | ICD-10-CM | POA: Diagnosis not present

## 2021-10-03 DIAGNOSIS — H2513 Age-related nuclear cataract, bilateral: Secondary | ICD-10-CM | POA: Diagnosis not present

## 2021-10-03 DIAGNOSIS — H43813 Vitreous degeneration, bilateral: Secondary | ICD-10-CM | POA: Diagnosis not present

## 2021-10-04 NOTE — Progress Notes (Signed)
10/14/21 ?2:14 PM  ? ?[name] [name] ?1935-12-30 ?[id number] ? ?Referring provider:  ?Perrin Maltese, MD ?[facility] [address] ?[place],  [place] [address] ? ?Chief Complaint  ?Patient presents with  ? Urinary Tract Infection  ? ? ?Urological history ?Prostate cancer  ?- s/p perineal prostatectomy in 1995 ?- PSA remains undetectable 09/2020 ? ?2. Urinary incontinence, male stress incontinence ?- Referred to physical therapy  ?- Managed with incontinence clamp on active days  ? ? ?HPI: ?[name] [name] is a 86 y.o.male who presents today for further evaluation of UTI.  ? ?He was seen at [facility] urgent care on 09/29/2021 for burning and gross hematuria.  He had been wearing the incontinence clamps for ~ 10 days.  He states he did not leave the clamp in place for long periods of time. He did not bring the clamp with him today   ? ?UA at [facility] urgent care was brown cloudy, nitrite positive, TNTC RBC's, 11-20 WBC's and few bacteria.  Urine culture was positive for E.coli.  He was started on Keflex, but then changed to Bactrim once the sensitivities returned.   ? ?He is still having a slight dysuria.  Patient denies any modifying or aggravating factors.  Patient denies any gross hematuria or suprapubic/flank pain.  Patient denies any fevers, chills, nausea or vomiting.   ? ?UA benign.  PVR 0 mL.   ? ?PMH: ?Past Medical History:  ?Diagnosis Date  ? Arthritis   ? Cancer [facility]) 1995  ? prostate ca   ? Coronary artery disease   ? Esophageal stricture 08/2016  ? GERD (gastroesophageal reflux disease)   ? Glaucoma   ? History of hiatal hernia   ? Hypertension   ? MI (mitral incompetence)   ? Peripheral neuropathy   ? Peripheral vascular disease ([place])   ? Vertigo   ? ? ?Surgical History: ?Past Surgical History:  ?Procedure Laterality Date  ? COLONOSCOPY    ? CORONARY ANGIOPLASTY WITH STENT PLACEMENT  2005  ? x9   ? CORONARY ARTERY BYPASS GRAFT  2011  ? CORONARY STENT PLACEMENT  07/2014  ? ESOPHAGOGASTRODUODENOSCOPY    ?  ESOPHAGOGASTRODUODENOSCOPY (EGD) WITH ESOPHAGEAL DILATION  2018  ? knee scope Bilateral   ? LEG SURGERY Right 09/2018  ? LIPOMA EXCISION N/A 09/24/2016  ? Procedure: EXCISION SUBCUTANEOUS LIPOMA ON UPPER BACK;  Surgeon: Donnie Mesa, MD;  Location: [place];  Service: General;  Laterality: N/A;  ? Prostectomy  1995  ? REVERSE SHOULDER ARTHROPLASTY Left 09/25/2017  ? REVERSE SHOULDER ARTHROPLASTY Left 09/25/2017  ? Procedure: LEFT REVERSE SHOULDER ARTHROPLASTY; deltoid repair;  Surgeon: Netta Cedars, MD;  Location: [place];  Service: Orthopedics;  Laterality: Left;  ? ROTATOR CUFF REPAIR Left 2001  ? ? ?Home Medications:  ?Allergies as of 10/07/2021   ? ?   Reactions  ? Docosahexaenoic Acid (dha) Diarrhea  ? Lovaza [omega-3-acid Ethyl Esters] Diarrhea  ? Atorvastatin Other (See Comments)  ? Joint pain  ? Rosuvastatin Other (See Comments)  ? Joint pain ?Other reaction(s): Unknown  ? Statins Other (See Comments)  ? Leg pain   ? Folic Acid   ? Note: anxiety itching and nausea  ? Folic Acid-vit Y1-EHU D14 Anxiety, Itching, Nausea Only  ? Hydrocodone Rash  ? Pravastatin Nausea Only  ? ?  ? ?  ?Medication List  ?  ? ?  ? Accurate as of October 07, 2021 11:59 PM. If you have any questions, ask your nurse or doctor.  ?  ?  ? ?  ? ?  STOP taking these medications   ? ?cephALEXin 500 MG capsule ?Commonly known as: KEFLEX ?Stopped by: Zara Council, PA-C ?  ?phenazopyridine 200 MG tablet ?Commonly known as: PYRIDIUM ?Stopped by: Zara Council, PA-C ?  ? ?  ? ?TAKE these medications   ? ?acetaminophen 650 MG CR tablet ?Commonly known as: TYLENOL ?Take 650 mg by mouth as needed for pain. 2 tablets in the morning and 2 tablets at bedtime ?  ?amLODipine 10 MG tablet ?Commonly known as: NORVASC ?Take 1 tablet (10 mg total) by mouth daily. ?  ?aspirin EC 81 MG tablet ?Take 81 mg by mouth daily. ?  ?azelastine 0.05 % ophthalmic solution ?Commonly known as: OPTIVAR ?1 drop 2 (two) times daily ?  ?B-12 1000 MCG/ML Kit ?Inject 1 mL as directed  every 30 (thirty) days. ?  ?baclofen 10 MG tablet ?Commonly known as: LIORESAL ?Take by mouth. ?  ?calcium carbonate 500 MG chewable tablet ?Commonly known as: TUMS - dosed in mg elemental calcium ?Chew 2 tablets by mouth daily as needed for indigestion or heartburn. ?  ?celecoxib 200 MG capsule ?Commonly known as: CELEBREX ?Take by mouth. ?  ?cholecalciferol 1000 units tablet ?Commonly known as: VITAMIN D ?Take 1,000 Units by mouth daily. ?  ?cycloSPORINE 0.05 % ophthalmic emulsion ?Commonly known as: RESTASIS ?Place 1 drop into both eyes 2 (two) times daily. ?  ?fenofibrate 145 MG tablet ?Commonly known as: Tricor ?Take 1 tablet (145 mg total) by mouth daily. ?  ?fluticasone 50 MCG/ACT nasal spray ?Commonly known as: FLONASE ?Place 1-2 sprays into both nostrils as needed. ?  ?ibuprofen 600 MG tablet ?Commonly known as: ADVIL ?Take by mouth. ?  ?latanoprost 0.005 % ophthalmic solution ?Commonly known as: XALATAN ?Place 1 drop into both eyes at bedtime. ?  ?levocetirizine 5 MG tablet ?Commonly known as: XYZAL ?  ?lisinopril 20 MG tablet ?Commonly known as: ZESTRIL ?Take 20 mg by mouth 2 (two) times daily. ?  ?meclizine 12.5 MG tablet ?Commonly known as: ANTIVERT ?Take 1 tablet by mouth daily. ?  ?metoprolol tartrate 25 MG tablet ?Commonly known as: LOPRESSOR ?TAKE 1/2 TABLET BY MOUTH TWICE DAILY ?  ?nitroGLYCERIN 0.4 MG SL tablet ?Commonly known as: NITROSTAT ?Place 1 tablet (0.4 mg total) under the tongue every 5 (five) minutes as needed for chest pain. ?  ?nystatin-triamcinolone ointment ?Commonly known as: MYCOLOG ?APPLY TOPICALLY TO THE AFFECTED AREA TWICE DAILY ?  ?ondansetron 4 MG disintegrating tablet ?Commonly known as: ZOFRAN-ODT ?ondansetron 4 mg disintegrating tablet ? DISSOLVE 1 TABLET ON THE TONGUE EVERY 8 HOURS AS NEEDED FOR NAUSEA ?  ?pantoprazole 40 MG tablet ?Commonly known as: PROTONIX ?1 tab(s) orally once a day for 90 ?  ?pregabalin 50 MG capsule ?Commonly known as: LYRICA ?Take 50 mg by mouth in  the morning and at bedtime. ?  ?Repatha SureClick 762 MG/ML Soaj ?Generic drug: Evolocumab ?ADMINISTER 1 ML UNDER THE SKIN EVERY 14 DAYS ?  ?SYSTANE OP ?Apply 1 drop to eye 5 (five) times daily as needed (dry eyes). ?  ?zolpidem 10 MG tablet ?Commonly known as: AMBIEN ?Take 10 mg by mouth at bedtime. ?  ? ?  ? ? ?Allergies:  ?Allergies  ?Allergen Reactions  ? Docosahexaenoic Acid (Dha) Diarrhea  ? Lovaza [Omega-3-Acid Ethyl Esters] Diarrhea  ? Atorvastatin Other (See Comments)  ?  Joint pain  ? Rosuvastatin Other (See Comments)  ?  Joint pain ?Other reaction(s): Unknown  ? Statins Other (See Comments)  ?  Leg pain   ?  Folic Acid   ?  Note: anxiety itching and nausea  ? Folic Acid-Vit A0-UOR V61 Anxiety, Itching and Nausea Only  ? Hydrocodone Rash  ? Pravastatin Nausea Only  ? ? ?Family History: ?Family History  ?Problem Relation Age of Onset  ? Alzheimer's disease Mother   ? Stroke Father   ? Parkinson's disease Sister   ? Cancer Sister   ? Obesity Sister   ? Diabetes Mellitus II Sister   ? ? ?Social History:  reports that he has never smoked. He has never been exposed to tobacco smoke. He has never used smokeless tobacco. He reports current alcohol use of about 7.0 standard drinks per week. He reports that he does not use drugs. ? ? ?Physical Exam: ?BP (!) 148/63   Pulse 62   Ht 6' (1.829 m)   Wt 202 lb (91.6 kg)   BMI 27.40 kg/m?   ?Constitutional:  Alert and oriented, No acute distress. ?HEENT: [place] AT, moist mucus membranes.  Trachea midline ?Cardiovascular: No clubbing, cyanosis, or edema. ?Respiratory: Normal respiratory effort, no increased work of breathing. ?Neurologic: Grossly intact, no focal deficits, moving all 4 extremities. ?Psychiatric: Normal mood and affect. ? ? ?Laboratory Data: ? Ref Range & Units 3 mo ago  ?Sodium 135 - 145 mmol/L 136   ?Potassium 3.4 - 4.8 mmol/L 4.1   ?Chloride 98 - 107 mmol/L 106   ?CO2 20.0 - 31.0 mmol/L 22.0   ?Anion Gap 5 - 14 mmol/L 8   ?BUN 9 - 23 mg/dL 13    ?Creatinine 0.60 - 1.10 mg/dL 0.99   ?BUN/Creatinine Ratio  13   ?eGFR CKD-EPI (2021) Male >=60 mL/min/1.20m 76   ?Comment: eGFR calculated with CKD-EPI 2021 equation in accordance with [employer]and [employer]

## 2021-10-07 ENCOUNTER — Ambulatory Visit: Payer: Medicare HMO | Admitting: Urology

## 2021-10-07 ENCOUNTER — Other Ambulatory Visit
Admission: RE | Admit: 2021-10-07 | Discharge: 2021-10-07 | Disposition: A | Discharge status: Home / Self Care | Payer: Medicare HMO | Source: Ambulatory Visit | Attending: Urology | Admitting: Urology

## 2021-10-07 ENCOUNTER — Other Ambulatory Visit: Payer: Self-pay

## 2021-10-07 ENCOUNTER — Encounter: Payer: Self-pay | Admitting: Urology

## 2021-10-07 ENCOUNTER — Other Ambulatory Visit: Payer: Self-pay | Admitting: *Deleted

## 2021-10-07 VITALS — BP 148/63 | HR 62 | Ht 72.0 in | Wt 202.0 lb

## 2021-10-07 DIAGNOSIS — N390 Urinary tract infection, site not specified: Secondary | ICD-10-CM | POA: Insufficient documentation

## 2021-10-07 DIAGNOSIS — N393 Stress incontinence (female) (male): Secondary | ICD-10-CM | POA: Diagnosis not present

## 2021-10-07 DIAGNOSIS — N3001 Acute cystitis with hematuria: Secondary | ICD-10-CM | POA: Diagnosis not present

## 2021-10-07 DIAGNOSIS — D519 Vitamin B12 deficiency anemia, unspecified: Secondary | ICD-10-CM | POA: Diagnosis not present

## 2021-10-07 DIAGNOSIS — R310 Gross hematuria: Secondary | ICD-10-CM | POA: Diagnosis not present

## 2021-10-07 DIAGNOSIS — J301 Allergic rhinitis due to pollen: Secondary | ICD-10-CM | POA: Diagnosis not present

## 2021-10-07 LAB — URINALYSIS, COMPLETE (UACMP) WITH MICROSCOPIC
Bacteria, UA: NONE SEEN
Bilirubin Urine: NEGATIVE
Glucose, UA: NEGATIVE mg/dL
Hgb urine dipstick: NEGATIVE
Ketones, ur: NEGATIVE mg/dL
Leukocytes,Ua: NEGATIVE
Nitrite: NEGATIVE
Protein, ur: NEGATIVE mg/dL
Specific Gravity, Urine: 1.02 (ref 1.005–1.030)
pH: 7 (ref 5.0–8.0)

## 2021-10-08 ENCOUNTER — Encounter: Payer: Medicare HMO | Admitting: Physical Therapy

## 2021-10-08 LAB — URINE CULTURE: Culture: NO GROWTH

## 2021-10-10 ENCOUNTER — Telehealth: Payer: Self-pay | Admitting: Urology

## 2021-10-10 ENCOUNTER — Encounter: Payer: Medicare HMO | Admitting: Physical Therapy

## 2021-10-10 NOTE — Telephone Encounter (Signed)
Spoke with patient and advised results   

## 2021-10-10 NOTE — Telephone Encounter (Signed)
Please let [name] [name] know that his urine culture was negative and he does not need an antibiotic at this time.  He does have an appointment with Dr. Erlene Quan on 04/28 if he is still having symptoms or wants to revisit the incontinence clamp.  He would prefer communication by telephone.  His lady friend looks at his 50.   ?

## 2021-10-14 LAB — BLADDER SCAN AMB NON-IMAGING: Scan Result: 0

## 2021-10-15 ENCOUNTER — Ambulatory Visit: Payer: Medicare HMO | Admitting: Physical Therapy

## 2021-10-15 DIAGNOSIS — M170 Bilateral primary osteoarthritis of knee: Secondary | ICD-10-CM | POA: Diagnosis not present

## 2021-10-17 ENCOUNTER — Encounter: Payer: Medicare HMO | Admitting: Physical Therapy

## 2021-10-22 ENCOUNTER — Ambulatory Visit: Payer: Medicare HMO | Admitting: Physical Therapy

## 2021-10-22 DIAGNOSIS — M170 Bilateral primary osteoarthritis of knee: Secondary | ICD-10-CM | POA: Diagnosis not present

## 2021-10-24 ENCOUNTER — Encounter: Payer: Medicare HMO | Admitting: Physical Therapy

## 2021-10-29 ENCOUNTER — Encounter: Payer: Medicare HMO | Admitting: Physical Therapy

## 2021-10-29 DIAGNOSIS — M170 Bilateral primary osteoarthritis of knee: Secondary | ICD-10-CM | POA: Diagnosis not present

## 2021-10-31 ENCOUNTER — Encounter: Payer: Medicare HMO | Admitting: Physical Therapy

## 2021-11-05 DIAGNOSIS — L578 Other skin changes due to chronic exposure to nonionizing radiation: Secondary | ICD-10-CM | POA: Diagnosis not present

## 2021-11-05 DIAGNOSIS — D485 Neoplasm of uncertain behavior of skin: Secondary | ICD-10-CM | POA: Diagnosis not present

## 2021-11-05 DIAGNOSIS — D225 Melanocytic nevi of trunk: Secondary | ICD-10-CM | POA: Diagnosis not present

## 2021-11-05 DIAGNOSIS — L821 Other seborrheic keratosis: Secondary | ICD-10-CM | POA: Diagnosis not present

## 2021-11-05 DIAGNOSIS — L570 Actinic keratosis: Secondary | ICD-10-CM | POA: Diagnosis not present

## 2021-11-05 DIAGNOSIS — D2371 Other benign neoplasm of skin of right lower limb, including hip: Secondary | ICD-10-CM | POA: Diagnosis not present

## 2021-11-06 ENCOUNTER — Other Ambulatory Visit: Payer: Self-pay

## 2021-11-06 NOTE — Progress Notes (Signed)
error 

## 2021-11-07 ENCOUNTER — Encounter: Payer: Medicare HMO | Admitting: Physical Therapy

## 2021-11-07 ENCOUNTER — Other Ambulatory Visit: Payer: Self-pay | Admitting: *Deleted

## 2021-11-07 DIAGNOSIS — Z23 Encounter for immunization: Secondary | ICD-10-CM | POA: Diagnosis not present

## 2021-11-07 DIAGNOSIS — I10 Essential (primary) hypertension: Secondary | ICD-10-CM | POA: Diagnosis not present

## 2021-11-07 DIAGNOSIS — R5383 Other fatigue: Secondary | ICD-10-CM | POA: Diagnosis not present

## 2021-11-07 DIAGNOSIS — E782 Mixed hyperlipidemia: Secondary | ICD-10-CM | POA: Diagnosis not present

## 2021-11-07 DIAGNOSIS — N390 Urinary tract infection, site not specified: Secondary | ICD-10-CM

## 2021-11-07 DIAGNOSIS — D519 Vitamin B12 deficiency anemia, unspecified: Secondary | ICD-10-CM | POA: Diagnosis not present

## 2021-11-07 DIAGNOSIS — M5441 Lumbago with sciatica, right side: Secondary | ICD-10-CM | POA: Diagnosis not present

## 2021-11-07 DIAGNOSIS — E559 Vitamin D deficiency, unspecified: Secondary | ICD-10-CM | POA: Diagnosis not present

## 2021-11-07 DIAGNOSIS — E538 Deficiency of other specified B group vitamins: Secondary | ICD-10-CM | POA: Diagnosis not present

## 2021-11-07 DIAGNOSIS — R7303 Prediabetes: Secondary | ICD-10-CM | POA: Diagnosis not present

## 2021-11-07 DIAGNOSIS — M25551 Pain in right hip: Secondary | ICD-10-CM | POA: Diagnosis not present

## 2021-11-07 NOTE — Progress Notes (Incomplete)
? ?11/07/21 ?4:53 PM  ? ?[name] [name] ?[birth year] ?[id number] ? ?Referring provider:  ?Perrin Maltese, MD ?[address] ?[place],  [place] [address] ?No chief complaint on file. ? ? ? ? ?HPI: ?[name] [name] is a 86 y.o.male with a personal history of prostate cancer, gross hematuria, and urinary incontinence, who presents today for further evaluation of UTI. ? ?He had a radical perineal prostatectomy in 1995 at [facility]. About 1 year after surgery he had recovered a reasonable degree of incontinence. .  ? ?He was seen at [facility] urgent care on 09/29/2021 for burning and gross hematuria.  He had been wearing the incontinence clamps for ~ 10 days. He was positive for E.coli and was treated with Bactrim.  ? ? ? ? ? ? ?PMH: ?Past Medical History:  ?Diagnosis Date  ? Arthritis   ? Cancer [facility]) 1995  ? prostate ca   ? Coronary artery disease   ? Esophageal stricture 08/2016  ? GERD (gastroesophageal reflux disease)   ? Glaucoma   ? History of hiatal hernia   ? Hypertension   ? MI (mitral incompetence)   ? Peripheral neuropathy   ? Peripheral vascular disease ([place])   ? Vertigo   ? ? ?Surgical History: ?Past Surgical History:  ?Procedure Laterality Date  ? COLONOSCOPY    ? CORONARY ANGIOPLASTY WITH STENT PLACEMENT  2005  ? x9   ? CORONARY ARTERY BYPASS GRAFT  2011  ? CORONARY STENT PLACEMENT  07/2014  ? ESOPHAGOGASTRODUODENOSCOPY    ? ESOPHAGOGASTRODUODENOSCOPY (EGD) WITH ESOPHAGEAL DILATION  2018  ? knee scope Bilateral   ? LEG SURGERY Right 09/2018  ? LIPOMA EXCISION N/A 09/24/2016  ? Procedure: EXCISION SUBCUTANEOUS LIPOMA ON UPPER BACK;  Surgeon: Donnie Mesa, MD;  Location: [place];  Service: General;  Laterality: N/A;  ? Prostectomy  1995  ? REVERSE SHOULDER ARTHROPLASTY Left 09/25/2017  ? REVERSE SHOULDER ARTHROPLASTY Left 09/25/2017  ? Procedure: LEFT REVERSE SHOULDER ARTHROPLASTY; deltoid repair;  Surgeon: Netta Cedars, MD;  Location: [place];  Service: Orthopedics;  Laterality: Left;  ? ROTATOR CUFF REPAIR Left 2001   ? ? ?Home Medications:  ?Allergies as of 11/08/2021   ? ?   Reactions  ? Docosahexaenoic Acid (dha) Diarrhea  ? Lovaza [omega-3-acid Ethyl Esters] Diarrhea  ? Atorvastatin Other (See Comments)  ? Joint pain  ? Rosuvastatin Other (See Comments)  ? Joint pain ?Other reaction(s): Unknown  ? Statins Other (See Comments)  ? Leg pain   ? Folic Acid   ? Note: anxiety itching and nausea  ? Folic Acid-vit Q1-JHE R74 Anxiety, Itching, Nausea Only  ? Hydrocodone Rash  ? Pravastatin Nausea Only  ? ?  ? ?  ?Medication List  ?  ? ?  ? Accurate as of November 07, 2021  4:53 PM. If you have any questions, ask your nurse or doctor.  ?  ?  ? ?  ? ?acetaminophen 650 MG CR tablet ?Commonly known as: TYLENOL ?Take 650 mg by mouth as needed for pain. 2 tablets in the morning and 2 tablets at bedtime ?  ?amLODipine 10 MG tablet ?Commonly known as: NORVASC ?Take 1 tablet (10 mg total) by mouth daily. ?  ?aspirin EC 81 MG tablet ?Take 81 mg by mouth daily. ?  ?azelastine 0.05 % ophthalmic solution ?Commonly known as: OPTIVAR ?1 drop 2 (two) times daily ?  ?B-12 1000 MCG/ML Kit ?Inject 1 mL as directed every 30 (thirty) days. ?  ?baclofen 10 MG tablet ?Commonly known  as: LIORESAL ?Take by mouth. ?  ?calcium carbonate 500 MG chewable tablet ?Commonly known as: TUMS - dosed in mg elemental calcium ?Chew 2 tablets by mouth daily as needed for indigestion or heartburn. ?  ?celecoxib 200 MG capsule ?Commonly known as: CELEBREX ?Take by mouth. ?  ?cholecalciferol 1000 units tablet ?Commonly known as: VITAMIN D ?Take 1,000 Units by mouth daily. ?  ?cycloSPORINE 0.05 % ophthalmic emulsion ?Commonly known as: RESTASIS ?Place 1 drop into both eyes 2 (two) times daily. ?  ?fenofibrate 145 MG tablet ?Commonly known as: Tricor ?Take 1 tablet (145 mg total) by mouth daily. ?  ?fluticasone 50 MCG/ACT nasal spray ?Commonly known as: FLONASE ?Place 1-2 sprays into both nostrils as needed. ?  ?ibuprofen 600 MG tablet ?Commonly known as: ADVIL ?Take by mouth. ?   ?latanoprost 0.005 % ophthalmic solution ?Commonly known as: XALATAN ?Place 1 drop into both eyes at bedtime. ?  ?levocetirizine 5 MG tablet ?Commonly known as: XYZAL ?  ?lisinopril 20 MG tablet ?Commonly known as: ZESTRIL ?Take 20 mg by mouth 2 (two) times daily. ?  ?meclizine 12.5 MG tablet ?Commonly known as: ANTIVERT ?Take 1 tablet by mouth daily. ?  ?metoprolol tartrate 25 MG tablet ?Commonly known as: LOPRESSOR ?TAKE 1/2 TABLET BY MOUTH TWICE DAILY ?  ?nitroGLYCERIN 0.4 MG SL tablet ?Commonly known as: NITROSTAT ?Place 1 tablet (0.4 mg total) under the tongue every 5 (five) minutes as needed for chest pain. ?  ?nystatin-triamcinolone ointment ?Commonly known as: MYCOLOG ?APPLY TOPICALLY TO THE AFFECTED AREA TWICE DAILY ?  ?ondansetron 4 MG disintegrating tablet ?Commonly known as: ZOFRAN-ODT ?ondansetron 4 mg disintegrating tablet ? DISSOLVE 1 TABLET ON THE TONGUE EVERY 8 HOURS AS NEEDED FOR NAUSEA ?  ?pantoprazole 40 MG tablet ?Commonly known as: PROTONIX ?1 tab(s) orally once a day for 90 ?  ?pregabalin 50 MG capsule ?Commonly known as: LYRICA ?Take 50 mg by mouth in the morning and at bedtime. ?  ?Repatha SureClick 915 MG/ML Soaj ?Generic drug: Evolocumab ?ADMINISTER 1 ML UNDER THE SKIN EVERY 14 DAYS ?  ?SYSTANE OP ?Apply 1 drop to eye 5 (five) times daily as needed (dry eyes). ?  ?zolpidem 10 MG tablet ?Commonly known as: AMBIEN ?Take 10 mg by mouth at bedtime. ?  ? ?  ? ? ?Allergies:  ?Allergies  ?Allergen Reactions  ? Docosahexaenoic Acid (Dha) Diarrhea  ? Lovaza [Omega-3-Acid Ethyl Esters] Diarrhea  ? Atorvastatin Other (See Comments)  ?  Joint pain  ? Rosuvastatin Other (See Comments)  ?  Joint pain ?Other reaction(s): Unknown  ? Statins Other (See Comments)  ?  Leg pain   ? Folic Acid   ?  Note: anxiety itching and nausea  ? Folic Acid-Vit A5-WPV X48 Anxiety, Itching and Nausea Only  ? Hydrocodone Rash  ? Pravastatin Nausea Only  ? ? ?Family History: ?Family History  ?Problem Relation Age of Onset  ?  Alzheimer's disease Mother   ? Stroke Father   ? Parkinson's disease Sister   ? Cancer Sister   ? Obesity Sister   ? Diabetes Mellitus II Sister   ? ? ?Social History:  reports that he has never smoked. He has never been exposed to tobacco smoke. He has never used smokeless tobacco. He reports current alcohol use of about 7.0 standard drinks per week. He reports that he does not use drugs. ? ? ?Physical Exam: ?There were no vitals taken for this visit.  ?Constitutional:  Alert and oriented, No acute distress. ?HEENT:   AT, moist mucus membranes.  Trachea midline, no masses. ?Cardiovascular: No clubbing, cyanosis, or edema. ?Respiratory: Normal respiratory effort, no increased work of breathing. ?Skin: No rashes, bruises or suspicious lesions. ?Neurologic: Grossly intact, no focal deficits, moving all 4 extremities. ?Psychiatric: Normal mood and affect. ? ?Laboratory Data: ? ?Lab Results  ?Component Value Date  ? CREATININE 0.92 07/06/2020  ? ?No results found for: HGBA1C ? ?Urinalysis ? ? ?Pertinent Imaging: ? ? ? ?Assessment & Plan:   ? ? ?No follow-ups on file. ? ?I,Kailey Littlejohn,acting as a scribe for Hollice Espy, MD.,have documented all relevant documentation on the behalf of Hollice Espy, MD,as directed by  Hollice Espy, MD while in the presence of Hollice Espy, MD. ? ? ?[facility] ?[address] ?[place], [place] [address] ?[phone] ?

## 2021-11-08 ENCOUNTER — Ambulatory Visit: Payer: Medicare HMO | Admitting: Urology

## 2021-11-08 ENCOUNTER — Other Ambulatory Visit: Payer: Self-pay | Admitting: Cardiology

## 2021-11-15 DIAGNOSIS — H353211 Exudative age-related macular degeneration, right eye, with active choroidal neovascularization: Secondary | ICD-10-CM | POA: Diagnosis not present

## 2021-11-15 DIAGNOSIS — H43813 Vitreous degeneration, bilateral: Secondary | ICD-10-CM | POA: Diagnosis not present

## 2021-11-15 DIAGNOSIS — H353122 Nonexudative age-related macular degeneration, left eye, intermediate dry stage: Secondary | ICD-10-CM | POA: Diagnosis not present

## 2021-11-15 DIAGNOSIS — H2513 Age-related nuclear cataract, bilateral: Secondary | ICD-10-CM | POA: Diagnosis not present

## 2021-11-17 NOTE — Progress Notes (Incomplete)
11/17/21 ?5:32 PM  ? ?[name] [name] ?October 11, 1935 ?[id number] ? ?Referring provider:  ?Perrin Maltese, MD ?[facility] ?[place],  [place] [address] ?No chief complaint on file. ? ? ?Urological history  ?Prostate cancer  ?- s/p perineal prostatectomy in 1995 ?- PSA remains undetectable 09/2020 ?  ?2. Urinary incontinence, male stress incontinence ?- Referred to physical therapy  ?- Managed with incontinence clamp on active days  ?  ? ? ?HPI: ?[name] [name] is a 86 y.o.male ? ? ? ? ? ?PMH: ?Past Medical History:  ?Diagnosis Date  ? Arthritis   ? Cancer [facility]) 1995  ? prostate ca   ? Coronary artery disease   ? Esophageal stricture 08/2016  ? GERD (gastroesophageal reflux disease)   ? Glaucoma   ? History of hiatal hernia   ? Hypertension   ? MI (mitral incompetence)   ? Peripheral neuropathy   ? Peripheral vascular disease ([place])   ? Vertigo   ? ? ?Surgical History: ?Past Surgical History:  ?Procedure Laterality Date  ? COLONOSCOPY    ? CORONARY ANGIOPLASTY WITH STENT PLACEMENT  2005  ? x9   ? CORONARY ARTERY BYPASS GRAFT  2011  ? CORONARY STENT PLACEMENT  07/2014  ? ESOPHAGOGASTRODUODENOSCOPY    ? ESOPHAGOGASTRODUODENOSCOPY (EGD) WITH ESOPHAGEAL DILATION  2018  ? knee scope Bilateral   ? LEG SURGERY Right 09/2018  ? LIPOMA EXCISION N/A 09/24/2016  ? Procedure: EXCISION SUBCUTANEOUS LIPOMA ON UPPER BACK;  Surgeon: Donnie Mesa, MD;  Location: [place];  Service: General;  Laterality: N/A;  ? Prostectomy  1995  ? REVERSE SHOULDER ARTHROPLASTY Left 09/25/2017  ? REVERSE SHOULDER ARTHROPLASTY Left 09/25/2017  ? Procedure: LEFT REVERSE SHOULDER ARTHROPLASTY; deltoid repair;  Surgeon: Netta Cedars, MD;  Location: [place];  Service: Orthopedics;  Laterality: Left;  ? ROTATOR CUFF REPAIR Left 2001  ? ? ?Home Medications:  ?Allergies as of 11/18/2021   ? ?   Reactions  ? Docosahexaenoic Acid (dha) Diarrhea  ? Lovaza [omega-3-acid Ethyl Esters] Diarrhea  ? Atorvastatin Other (See Comments)  ? Joint pain  ? Rosuvastatin Other (See Comments)   ? Joint pain ?Other reaction(s): Unknown  ? Statins Other (See Comments)  ? Leg pain   ? Folic Acid   ? Note: anxiety itching and nausea  ? Folic Acid-vit M7-EHM C94 Anxiety, Itching, Nausea Only  ? Hydrocodone Rash  ? Pravastatin Nausea Only  ? ?  ? ?  ?Medication List  ?  ? ?  ? Accurate as of Nov 17, 2021  5:32 PM. If you have any questions, ask your nurse or doctor.  ?  ?  ? ?  ? ?acetaminophen 650 MG CR tablet ?Commonly known as: TYLENOL ?Take 650 mg by mouth as needed for pain. 2 tablets in the morning and 2 tablets at bedtime ?  ?amLODipine 10 MG tablet ?Commonly known as: NORVASC ?Take 1 tablet (10 mg total) by mouth daily. ?  ?aspirin EC 81 MG tablet ?Take 81 mg by mouth daily. ?  ?azelastine 0.05 % ophthalmic solution ?Commonly known as: OPTIVAR ?1 drop 2 (two) times daily ?  ?B-12 1000 MCG/ML Kit ?Inject 1 mL as directed every 30 (thirty) days. ?  ?baclofen 10 MG tablet ?Commonly known as: LIORESAL ?Take by mouth. ?  ?calcium carbonate 500 MG chewable tablet ?Commonly known as: TUMS - dosed in mg elemental calcium ?Chew 2 tablets by mouth daily as needed for indigestion or heartburn. ?  ?celecoxib 200 MG capsule ?Commonly known as:  CELEBREX ?Take by mouth. ?  ?cholecalciferol 1000 units tablet ?Commonly known as: VITAMIN D ?Take 1,000 Units by mouth daily. ?  ?cycloSPORINE 0.05 % ophthalmic emulsion ?Commonly known as: RESTASIS ?Place 1 drop into both eyes 2 (two) times daily. ?  ?fenofibrate 145 MG tablet ?Commonly known as: Tricor ?Take 1 tablet (145 mg total) by mouth daily. ?  ?fluticasone 50 MCG/ACT nasal spray ?Commonly known as: FLONASE ?Place 1-2 sprays into both nostrils as needed. ?  ?ibuprofen 600 MG tablet ?Commonly known as: ADVIL ?Take by mouth. ?  ?latanoprost 0.005 % ophthalmic solution ?Commonly known as: XALATAN ?Place 1 drop into both eyes at bedtime. ?  ?levocetirizine 5 MG tablet ?Commonly known as: XYZAL ?  ?lisinopril 20 MG tablet ?Commonly known as: ZESTRIL ?Take 20 mg by mouth 2  (two) times daily. ?  ?meclizine 12.5 MG tablet ?Commonly known as: ANTIVERT ?Take 1 tablet by mouth daily. ?  ?metoprolol tartrate 25 MG tablet ?Commonly known as: LOPRESSOR ?TAKE 1/2 TABLET BY MOUTH TWICE DAILY ?  ?nitroGLYCERIN 0.4 MG SL tablet ?Commonly known as: NITROSTAT ?Place 1 tablet (0.4 mg total) under the tongue every 5 (five) minutes as needed for chest pain. ?  ?nystatin-triamcinolone ointment ?Commonly known as: MYCOLOG ?APPLY TOPICALLY TO THE AFFECTED AREA TWICE DAILY ?  ?ondansetron 4 MG disintegrating tablet ?Commonly known as: ZOFRAN-ODT ?ondansetron 4 mg disintegrating tablet ? DISSOLVE 1 TABLET ON THE TONGUE EVERY 8 HOURS AS NEEDED FOR NAUSEA ?  ?pantoprazole 40 MG tablet ?Commonly known as: PROTONIX ?1 tab(s) orally once a day for 90 ?  ?pregabalin 50 MG capsule ?Commonly known as: LYRICA ?Take 50 mg by mouth in the morning and at bedtime. ?  ?Repatha SureClick 263 MG/ML Soaj ?Generic drug: Evolocumab ?ADMINISTER 1 ML UNDER THE SKIN EVERY 14 DAYS ?  ?SYSTANE OP ?Apply 1 drop to eye 5 (five) times daily as needed (dry eyes). ?  ?zolpidem 10 MG tablet ?Commonly known as: AMBIEN ?Take 10 mg by mouth at bedtime. ?  ? ?  ? ? ?Allergies:  ?Allergies  ?Allergen Reactions  ? Docosahexaenoic Acid (Dha) Diarrhea  ? Lovaza [Omega-3-Acid Ethyl Esters] Diarrhea  ? Atorvastatin Other (See Comments)  ?  Joint pain  ? Rosuvastatin Other (See Comments)  ?  Joint pain ?Other reaction(s): Unknown  ? Statins Other (See Comments)  ?  Leg pain   ? Folic Acid   ?  Note: anxiety itching and nausea  ? Folic Acid-Vit Z8-HYI F02 Anxiety, Itching and Nausea Only  ? Hydrocodone Rash  ? Pravastatin Nausea Only  ? ? ?Family History: ?Family History  ?Problem Relation Age of Onset  ? Alzheimer's disease Mother   ? Stroke Father   ? Parkinson's disease Sister   ? Cancer Sister   ? Obesity Sister   ? Diabetes Mellitus II Sister   ? ? ?Social History:  reports that he has never smoked. He has never been exposed to tobacco smoke.  He has never used smokeless tobacco. He reports current alcohol use of about 7.0 standard drinks per week. He reports that he does not use drugs. ? ? ?Physical Exam: ?There were no vitals taken for this visit.  ?Constitutional:  Alert and oriented, No acute distress. ?HEENT: [place] AT, moist mucus membranes.  Trachea midline, no masses. ?Cardiovascular: No clubbing, cyanosis, or edema. ?Respiratory: Normal respiratory effort, no increased work of breathing. ?GI: Abdomen is soft, nontender, nondistended, no abdominal masses ?GU: No CVA tenderness ?Lymph: No cervical or inguinal lymphadenopathy. ?Skin:  No rashes, bruises or suspicious lesions. ?Neurologic: Grossly intact, no focal deficits, moving all 4 extremities. ?Psychiatric: Normal mood and affect. ? ?Laboratory Data: ? ?Lab Results  ?Component Value Date  ? CREATININE 0.92 07/06/2020  ? ? ? ?No results found for: HGBA1C ? ?Urinalysis ? ? ?Pertinent Imaging: ? ? ?Assessment & Plan:   ? ? ?No follow-ups on file. ? ?[facility] ?[address] ?[place], [place] [address] ?[phone] ? ?I,Kailey Littlejohn,acting as a Education administrator for [employer], PA-C.,have documented all relevant documentation on the behalf of [place], PA-C,as directed by  [facility], PA-C while in the presence of Manuel Garcia, PA-C. ? ?

## 2021-11-18 ENCOUNTER — Other Ambulatory Visit
Admission: RE | Admit: 2021-11-18 | Discharge: 2021-11-18 | Disposition: A | Discharge status: Home / Self Care | Payer: Medicare HMO | Attending: Urology | Admitting: Urology

## 2021-11-18 ENCOUNTER — Other Ambulatory Visit: Payer: Self-pay | Admitting: *Deleted

## 2021-11-18 ENCOUNTER — Ambulatory Visit: Payer: Medicare HMO | Admitting: Urology

## 2021-11-18 DIAGNOSIS — N390 Urinary tract infection, site not specified: Secondary | ICD-10-CM | POA: Insufficient documentation

## 2021-11-18 DIAGNOSIS — C61 Malignant neoplasm of prostate: Secondary | ICD-10-CM | POA: Diagnosis not present

## 2021-11-18 LAB — URINALYSIS, COMPLETE (UACMP) WITH MICROSCOPIC
Bilirubin Urine: NEGATIVE
Glucose, UA: NEGATIVE mg/dL
Hgb urine dipstick: NEGATIVE
Ketones, ur: NEGATIVE mg/dL
Leukocytes,Ua: NEGATIVE
Nitrite: NEGATIVE
Protein, ur: NEGATIVE mg/dL
Specific Gravity, Urine: 1.015 (ref 1.005–1.030)
pH: 6.5 (ref 5.0–8.0)

## 2021-11-18 LAB — PSA: Prostatic Specific Antigen: 0.01 ng/mL (ref 0.00–4.00)

## 2021-11-21 ENCOUNTER — Ambulatory Visit: Payer: Medicare HMO | Admitting: Physician Assistant

## 2021-11-21 ENCOUNTER — Encounter: Payer: Self-pay | Admitting: Physician Assistant

## 2021-11-21 VITALS — BP 134/65 | HR 72 | Ht 72.0 in | Wt 202.0 lb

## 2021-11-21 DIAGNOSIS — I10 Essential (primary) hypertension: Secondary | ICD-10-CM | POA: Diagnosis not present

## 2021-11-21 DIAGNOSIS — N393 Stress incontinence (female) (male): Secondary | ICD-10-CM

## 2021-11-21 DIAGNOSIS — Z87440 Personal history of urinary (tract) infections: Secondary | ICD-10-CM | POA: Diagnosis not present

## 2021-11-21 DIAGNOSIS — I340 Nonrheumatic mitral (valve) insufficiency: Secondary | ICD-10-CM | POA: Diagnosis not present

## 2021-11-21 DIAGNOSIS — C61 Malignant neoplasm of prostate: Secondary | ICD-10-CM

## 2021-11-21 DIAGNOSIS — I2510 Atherosclerotic heart disease of native coronary artery without angina pectoris: Secondary | ICD-10-CM | POA: Diagnosis not present

## 2021-11-21 DIAGNOSIS — N390 Urinary tract infection, site not specified: Secondary | ICD-10-CM | POA: Diagnosis not present

## 2021-11-21 DIAGNOSIS — M5441 Lumbago with sciatica, right side: Secondary | ICD-10-CM | POA: Diagnosis not present

## 2021-11-21 DIAGNOSIS — R55 Syncope and collapse: Secondary | ICD-10-CM | POA: Diagnosis not present

## 2021-11-21 DIAGNOSIS — I351 Nonrheumatic aortic (valve) insufficiency: Secondary | ICD-10-CM | POA: Diagnosis not present

## 2021-11-21 DIAGNOSIS — I219 Acute myocardial infarction, unspecified: Secondary | ICD-10-CM | POA: Diagnosis not present

## 2021-11-21 DIAGNOSIS — M25551 Pain in right hip: Secondary | ICD-10-CM | POA: Diagnosis not present

## 2021-11-21 DIAGNOSIS — E782 Mixed hyperlipidemia: Secondary | ICD-10-CM | POA: Diagnosis not present

## 2021-11-21 LAB — BLADDER SCAN AMB NON-IMAGING: Scan Result: 0

## 2021-11-21 NOTE — Patient Instructions (Signed)
Condom Cath Patient Instruction Guide ? ?Applying the male external catheter ?  ? ?Use the sizing guide to ensure a proper fit of the Male external catheter ?Always measure for correct size ?(both circumference and length with ?appropriate measuring guide for specific product) ?Opening the male external catheter  ? ?Flip open the pack using your thumb nail to break the seal. ? ?remove catheter from package  ?Remove the catheter from the package and place it over the head of the penis. ?Leave a small space between the end of the penis and the narrow catheter outlet. ?Uncircumcised users should leave the foreskin in place over the head of the penis. ?male external catheter application  ? ?Pull the double grip strip to slowly unroll the catheter ?all the way up the length of the penis. The catheter should unroll smoothly and evenly. ?squeeze catheter to apply  ? ?Gently squeeze the catheter around the shaft of the penis (approx. 1 min.) ?to ensure a secure fit. To reduce the risk of skin irritation, allow the skin ?on the penis to breathe for short periods in between male external catheter changes. ? ?  ? ?Connecting the Urine Bag ?Connecting the [facility] to the Urine bag  ?Connect a urine collecting bag to the catheter by fully inserting the tubing connector into the external catheter outlet. Push together firmly for a secure connection. ?   ?Removal is easy and painless. The catheter can be removed by detaching the catheter from the urine bag connector and carefully rolling it off the penis. If you need to, use warm soapy water to help remove the catheter. It can then be disposed of in the trash, with general household waste. Finish by washing your hands. Change the male external catheter everyday and change the bag according to recommendations from your healthcare professional. ?   ?

## 2021-11-21 NOTE — Progress Notes (Signed)
? ?11/21/2021 ?4:40 PM  ? ?[name] [name] ?February 10, 1936 ?[id number] ? ?CC: ?Chief Complaint  ?Patient presents with  ? Recurrent UTI  ? ?HPI: ?[name] [name] is a 86 y.o. male with PMH prostate cancer s/p perineal prostatectomy in 1995 and stress incontinence who presents today for follow-up of UTI.  ? ?Today he reports he had been using a penile incontinence clamp and he attributes this to the development of a urinary tract infection.  He was seen by [facility] on 09/29/2021, at which point urine was grossly infected and his urine culture grew ampicillin, amp sulbactam, and cefazolin resistant E. coli.  He has had 2 follow-up urinalyses since, both of which were bland.  He states he wanted to follow-up with [place] to make sure the infection was resolved.  He states he has some very mild persistent dysuria. ? ?He wonders about his other management options for his stress incontinence given that he does not wish to use an incontinence clamp anymore. ? ?His PSA was drawn by his PCP last week and he is curious about the results. ? ?In-office UA today pan negative; urine microscopy with >10 epithelial cells/hpf.  PVR 0 mL. ? ?PMH: ?Past Medical History:  ?Diagnosis Date  ? Arthritis   ? Cancer [facility]) 1995  ? prostate ca   ? Coronary artery disease   ? Esophageal stricture 08/2016  ? GERD (gastroesophageal reflux disease)   ? Glaucoma   ? History of hiatal hernia   ? Hypertension   ? MI (mitral incompetence)   ? Peripheral neuropathy   ? Peripheral vascular disease ([place])   ? Vertigo   ? ? ?Surgical History: ?Past Surgical History:  ?Procedure Laterality Date  ? COLONOSCOPY    ? CORONARY ANGIOPLASTY WITH STENT PLACEMENT  2005  ? x9   ? CORONARY ARTERY BYPASS GRAFT  2011  ? CORONARY STENT PLACEMENT  07/2014  ? ESOPHAGOGASTRODUODENOSCOPY    ? ESOPHAGOGASTRODUODENOSCOPY (EGD) WITH ESOPHAGEAL DILATION  2018  ? knee scope Bilateral   ? LEG SURGERY Right 09/2018  ? LIPOMA EXCISION N/A 09/24/2016  ? Procedure: EXCISION  SUBCUTANEOUS LIPOMA ON UPPER BACK;  Surgeon: Donnie Mesa, MD;  Location: [place];  Service: General;  Laterality: N/A;  ? Prostectomy  1995  ? REVERSE SHOULDER ARTHROPLASTY Left 09/25/2017  ? REVERSE SHOULDER ARTHROPLASTY Left 09/25/2017  ? Procedure: LEFT REVERSE SHOULDER ARTHROPLASTY; deltoid repair;  Surgeon: Netta Cedars, MD;  Location: [place];  Service: Orthopedics;  Laterality: Left;  ? ROTATOR CUFF REPAIR Left 2001  ? ? ?Home Medications:  ?Allergies as of 11/21/2021   ? ?   Reactions  ? Docosahexaenoic Acid (dha) Diarrhea  ? Lovaza [omega-3-acid Ethyl Esters] Diarrhea  ? Atorvastatin Other (See Comments)  ? Joint pain  ? Rosuvastatin Other (See Comments)  ? Joint pain ?Other reaction(s): Unknown  ? Statins Other (See Comments)  ? Leg pain   ? Folic Acid   ? Note: anxiety itching and nausea  ? Folic Acid-vit I5-OYD X41 Anxiety, Itching, Nausea Only  ? Hydrocodone Rash  ? Pravastatin Nausea Only  ? ?  ? ?  ?Medication List  ?  ? ?  ? Accurate as of Nov 21, 2021  4:40 PM. If you have any questions, ask your nurse or doctor.  ?  ?  ? ?  ? ?acetaminophen 650 MG CR tablet ?Commonly known as: TYLENOL ?Take 650 mg by mouth as needed for pain. 2 tablets in the morning and 2 tablets at  bedtime ?  ?amLODipine 10 MG tablet ?Commonly known as: NORVASC ?Take 1 tablet (10 mg total) by mouth daily. ?  ?aspirin EC 81 MG tablet ?Take 81 mg by mouth daily. ?  ?azelastine 0.05 % ophthalmic solution ?Commonly known as: OPTIVAR ?1 drop 2 (two) times daily ?  ?B-12 1000 MCG/ML Kit ?Inject 1 mL as directed every 30 (thirty) days. ?  ?baclofen 10 MG tablet ?Commonly known as: LIORESAL ?Take by mouth. ?  ?calcium carbonate 500 MG chewable tablet ?Commonly known as: TUMS - dosed in mg elemental calcium ?Chew 2 tablets by mouth daily as needed for indigestion or heartburn. ?  ?celecoxib 200 MG capsule ?Commonly known as: CELEBREX ?Take by mouth. ?  ?cholecalciferol 1000 units tablet ?Commonly known as: VITAMIN D ?Take 1,000 Units by  mouth daily. ?  ?cycloSPORINE 0.05 % ophthalmic emulsion ?Commonly known as: RESTASIS ?Place 1 drop into both eyes 2 (two) times daily. ?  ?fenofibrate 145 MG tablet ?Commonly known as: Tricor ?Take 1 tablet (145 mg total) by mouth daily. ?  ?fluticasone 50 MCG/ACT nasal spray ?Commonly known as: FLONASE ?Place 1-2 sprays into both nostrils as needed. ?  ?ibuprofen 600 MG tablet ?Commonly known as: ADVIL ?Take by mouth. ?  ?latanoprost 0.005 % ophthalmic solution ?Commonly known as: XALATAN ?Place 1 drop into both eyes at bedtime. ?  ?levocetirizine 5 MG tablet ?Commonly known as: XYZAL ?  ?lisinopril 20 MG tablet ?Commonly known as: ZESTRIL ?Take 20 mg by mouth 2 (two) times daily. ?  ?meclizine 12.5 MG tablet ?Commonly known as: ANTIVERT ?Take 1 tablet by mouth daily. ?  ?metoprolol tartrate 25 MG tablet ?Commonly known as: LOPRESSOR ?TAKE 1/2 TABLET BY MOUTH TWICE DAILY ?  ?nitroGLYCERIN 0.4 MG SL tablet ?Commonly known as: NITROSTAT ?Place 1 tablet (0.4 mg total) under the tongue every 5 (five) minutes as needed for chest pain. ?  ?nystatin-triamcinolone ointment ?Commonly known as: MYCOLOG ?APPLY TOPICALLY TO THE AFFECTED AREA TWICE DAILY ?  ?ondansetron 4 MG disintegrating tablet ?Commonly known as: ZOFRAN-ODT ?ondansetron 4 mg disintegrating tablet ? DISSOLVE 1 TABLET ON THE TONGUE EVERY 8 HOURS AS NEEDED FOR NAUSEA ?  ?pantoprazole 40 MG tablet ?Commonly known as: PROTONIX ?1 tab(s) orally once a day for 90 ?  ?pregabalin 50 MG capsule ?Commonly known as: LYRICA ?Take 50 mg by mouth in the morning and at bedtime. ?  ?Repatha SureClick 287 MG/ML Soaj ?Generic drug: Evolocumab ?ADMINISTER 1 ML UNDER THE SKIN EVERY 14 DAYS ?  ?SYSTANE OP ?Apply 1 drop to eye 5 (five) times daily as needed (dry eyes). ?  ?zolpidem 10 MG tablet ?Commonly known as: AMBIEN ?Take 10 mg by mouth at bedtime. ?  ? ?  ? ? ?Allergies:  ?Allergies  ?Allergen Reactions  ? Docosahexaenoic Acid (Dha) Diarrhea  ? Lovaza [Omega-3-Acid Ethyl  Esters] Diarrhea  ? Atorvastatin Other (See Comments)  ?  Joint pain  ? Rosuvastatin Other (See Comments)  ?  Joint pain ?Other reaction(s): Unknown  ? Statins Other (See Comments)  ?  Leg pain   ? Folic Acid   ?  Note: anxiety itching and nausea  ? Folic Acid-Vit G8-TLX B26 Anxiety, Itching and Nausea Only  ? Hydrocodone Rash  ? Pravastatin Nausea Only  ? ? ?Family History: ?Family History  ?Problem Relation Age of Onset  ? Alzheimer's disease Mother   ? Stroke Father   ? Parkinson's disease Sister   ? Cancer Sister   ? Obesity Sister   ? Diabetes  Mellitus II Sister   ? ? ?Social History:  ? reports that he has never smoked. He has never been exposed to tobacco smoke. He has never used smokeless tobacco. He reports current alcohol use of about 7.0 standard drinks per week. He reports that he does not use drugs. ? ?Physical Exam: ?BP 134/65   Pulse 72   Ht 6' (1.829 m)   Wt 202 lb (91.6 kg)   BMI 27.40 kg/m?   ?Constitutional:  Alert and oriented, no acute distress, nontoxic appearing ?HEENT: [place], AT ?Cardiovascular: No clubbing, cyanosis, or edema ?Respiratory: Normal respiratory effort, no increased work of breathing ?Skin: No rashes, bruises or suspicious lesions ?Neurologic: Grossly intact, no focal deficits, moving all 4 extremities ?Psychiatric: Normal mood and affect ? ?Laboratory Data: ?Results for orders placed or performed in visit on 11/21/21  ?Microscopic Examination  ? Urine  ?Result Value Ref Range  ? WBC, UA 0-5 0 - 5 /hpf  ? RBC 0-2 0 - 2 /hpf  ? Epithelial Cells (non renal) >10 (A) 0 - 10 /hpf  ? Bacteria, UA Few None seen/Few  ?Urinalysis, Complete  ?Result Value Ref Range  ? Specific Gravity, UA >1.030 (H) 1.005 - 1.030  ? pH, UA 5.5 5.0 - 7.5  ? Color, UA Yellow Yellow  ? Appearance Ur Clear Clear  ? Leukocytes,UA Negative Negative  ? Protein,UA Negative Negative/Trace  ? Glucose, UA Negative Negative  ? Ketones, UA Negative Negative  ? RBC, UA Negative Negative  ? Bilirubin, UA Negative  Negative  ? Urobilinogen, Ur 0.2 0.2 - 1.0 mg/dL  ? Nitrite, UA Negative Negative  ? Microscopic Examination See below:   ?Bladder Scan (Post Void Residual) in office  ?Result Value Ref Range  ? Scan Result 0   ? ?Assessment &

## 2021-11-22 LAB — MICROSCOPIC EXAMINATION: Epithelial Cells (non renal): >10 /hpf — AB (ref 0–10)

## 2021-11-22 LAB — URINALYSIS, COMPLETE
Bilirubin, UA: NEGATIVE
Glucose, UA: NEGATIVE
Ketones, UA: NEGATIVE
Leukocytes,UA: NEGATIVE
Nitrite, UA: NEGATIVE
Protein,UA: NEGATIVE
RBC, UA: NEGATIVE
Specific Gravity, UA: >1.03 — ABNORMAL HIGH (ref 1.005–1.030)
Urobilinogen, Ur: 0.2 mg/dL (ref 0.2–1.0)
pH, UA: 5.5 (ref 5.0–7.5)

## 2021-11-27 DIAGNOSIS — R55 Syncope and collapse: Secondary | ICD-10-CM | POA: Diagnosis not present

## 2021-12-12 DIAGNOSIS — E782 Mixed hyperlipidemia: Secondary | ICD-10-CM | POA: Diagnosis not present

## 2021-12-12 DIAGNOSIS — D519 Vitamin B12 deficiency anemia, unspecified: Secondary | ICD-10-CM | POA: Diagnosis not present

## 2021-12-12 DIAGNOSIS — I2510 Atherosclerotic heart disease of native coronary artery without angina pectoris: Secondary | ICD-10-CM | POA: Diagnosis not present

## 2021-12-12 DIAGNOSIS — I10 Essential (primary) hypertension: Secondary | ICD-10-CM | POA: Diagnosis not present

## 2021-12-12 DIAGNOSIS — I351 Nonrheumatic aortic (valve) insufficiency: Secondary | ICD-10-CM | POA: Diagnosis not present

## 2021-12-12 DIAGNOSIS — I219 Acute myocardial infarction, unspecified: Secondary | ICD-10-CM | POA: Diagnosis not present

## 2021-12-12 DIAGNOSIS — I340 Nonrheumatic mitral (valve) insufficiency: Secondary | ICD-10-CM | POA: Diagnosis not present

## 2021-12-19 DIAGNOSIS — H353211 Exudative age-related macular degeneration, right eye, with active choroidal neovascularization: Secondary | ICD-10-CM | POA: Diagnosis not present

## 2021-12-19 DIAGNOSIS — H353122 Nonexudative age-related macular degeneration, left eye, intermediate dry stage: Secondary | ICD-10-CM | POA: Diagnosis not present

## 2021-12-19 DIAGNOSIS — H43811 Vitreous degeneration, right eye: Secondary | ICD-10-CM | POA: Diagnosis not present

## 2021-12-19 DIAGNOSIS — H35033 Hypertensive retinopathy, bilateral: Secondary | ICD-10-CM | POA: Diagnosis not present

## 2022-01-01 ENCOUNTER — Ambulatory Visit: Payer: Medicare HMO | Admitting: Cardiology

## 2022-01-10 DIAGNOSIS — E782 Mixed hyperlipidemia: Secondary | ICD-10-CM | POA: Diagnosis not present

## 2022-01-10 DIAGNOSIS — I2510 Atherosclerotic heart disease of native coronary artery without angina pectoris: Secondary | ICD-10-CM | POA: Diagnosis not present

## 2022-01-10 DIAGNOSIS — I10 Essential (primary) hypertension: Secondary | ICD-10-CM | POA: Diagnosis not present

## 2022-01-13 DIAGNOSIS — I10 Essential (primary) hypertension: Secondary | ICD-10-CM | POA: Diagnosis not present

## 2022-01-13 DIAGNOSIS — I351 Nonrheumatic aortic (valve) insufficiency: Secondary | ICD-10-CM | POA: Diagnosis not present

## 2022-01-13 DIAGNOSIS — I2510 Atherosclerotic heart disease of native coronary artery without angina pectoris: Secondary | ICD-10-CM | POA: Diagnosis not present

## 2022-01-13 DIAGNOSIS — I219 Acute myocardial infarction, unspecified: Secondary | ICD-10-CM | POA: Diagnosis not present

## 2022-01-13 DIAGNOSIS — D519 Vitamin B12 deficiency anemia, unspecified: Secondary | ICD-10-CM | POA: Diagnosis not present

## 2022-01-13 DIAGNOSIS — I340 Nonrheumatic mitral (valve) insufficiency: Secondary | ICD-10-CM | POA: Diagnosis not present

## 2022-01-13 DIAGNOSIS — R0602 Shortness of breath: Secondary | ICD-10-CM | POA: Diagnosis not present

## 2022-01-13 DIAGNOSIS — E782 Mixed hyperlipidemia: Secondary | ICD-10-CM | POA: Diagnosis not present

## 2022-01-30 DIAGNOSIS — H353122 Nonexudative age-related macular degeneration, left eye, intermediate dry stage: Secondary | ICD-10-CM | POA: Diagnosis not present

## 2022-01-30 DIAGNOSIS — H353211 Exudative age-related macular degeneration, right eye, with active choroidal neovascularization: Secondary | ICD-10-CM | POA: Diagnosis not present

## 2022-02-06 DIAGNOSIS — R7303 Prediabetes: Secondary | ICD-10-CM | POA: Diagnosis not present

## 2022-02-06 DIAGNOSIS — D519 Vitamin B12 deficiency anemia, unspecified: Secondary | ICD-10-CM | POA: Diagnosis not present

## 2022-02-06 DIAGNOSIS — E782 Mixed hyperlipidemia: Secondary | ICD-10-CM | POA: Diagnosis not present

## 2022-02-06 DIAGNOSIS — I70211 Atherosclerosis of native arteries of extremities with intermittent claudication, right leg: Secondary | ICD-10-CM | POA: Diagnosis not present

## 2022-02-06 DIAGNOSIS — R2241 Localized swelling, mass and lump, right lower limb: Secondary | ICD-10-CM | POA: Diagnosis not present

## 2022-02-06 DIAGNOSIS — S90211A Contusion of right great toe with damage to nail, initial encounter: Secondary | ICD-10-CM | POA: Diagnosis not present

## 2022-02-06 DIAGNOSIS — I10 Essential (primary) hypertension: Secondary | ICD-10-CM | POA: Diagnosis not present

## 2022-02-06 DIAGNOSIS — I739 Peripheral vascular disease, unspecified: Secondary | ICD-10-CM | POA: Diagnosis not present

## 2022-02-06 DIAGNOSIS — K219 Gastro-esophageal reflux disease without esophagitis: Secondary | ICD-10-CM | POA: Diagnosis not present

## 2022-02-09 ENCOUNTER — Ambulatory Visit (INDEPENDENT_AMBULATORY_CARE_PROVIDER_SITE_OTHER): Payer: Medicare HMO

## 2022-02-09 ENCOUNTER — Ambulatory Visit
Admission: EM | Admit: 2022-02-09 | Discharge: 2022-02-09 | Disposition: A | Discharge status: Home / Self Care | Payer: Medicare HMO | Attending: Family Medicine | Admitting: Family Medicine

## 2022-02-09 ENCOUNTER — Encounter: Payer: Self-pay | Admitting: Emergency Medicine

## 2022-02-09 DIAGNOSIS — W19XXXA Unspecified fall, initial encounter: Secondary | ICD-10-CM

## 2022-02-09 DIAGNOSIS — M25561 Pain in right knee: Secondary | ICD-10-CM | POA: Diagnosis not present

## 2022-02-09 DIAGNOSIS — M25551 Pain in right hip: Secondary | ICD-10-CM | POA: Diagnosis not present

## 2022-02-09 DIAGNOSIS — M7918 Myalgia, other site: Secondary | ICD-10-CM

## 2022-02-09 DIAGNOSIS — M545 Low back pain, unspecified: Secondary | ICD-10-CM | POA: Diagnosis not present

## 2022-02-09 DIAGNOSIS — M1611 Unilateral primary osteoarthritis, right hip: Secondary | ICD-10-CM | POA: Diagnosis not present

## 2022-02-09 NOTE — ED Provider Notes (Signed)
[facility]    CSN: [id number] Arrival date & time: 02/09/22  1016      History   Chief Complaint Chief Complaint  Patient presents with   Fall   Hip Pain    HPI 86 year old male with the below mentioned past medical history presents with the above complaints.  Patient states that he fell while he was in his kitchen on Thursday.  He states that he injured his right knee and has been having pain in the right knee, right hip, right groin, and right low back.  He states that he takes Celebrex on a regular basis.  His pain is currently 7/10 in severity.  No relieving factors.  He is concerned about potential fracture.  No other associated symptoms.  No other complaints.  Past Medical History:  Diagnosis Date   Arthritis    Cancer [facility]) 1995   prostate ca    Coronary artery disease    Esophageal stricture 08/2016   GERD (gastroesophageal reflux disease)    Glaucoma    History of hiatal hernia    Hypertension    MI (mitral incompetence)    Peripheral neuropathy    Peripheral vascular disease ([facility])    Vertigo     Patient Active Problem List   Diagnosis Date Noted   Exocrine pancreatic insufficiency 10/28/2019   Peripheral vascular disease ([place]) 10/28/2019   Absolute anemia 08/29/2019   Coronary artery disease 05/01/2019   Old myocardial infarction 05/01/2019   Tinnitus 05/01/2019   Other and unspecified peripheral vertigo 05/01/2019   Low back pain with left-sided sciatica 02/13/2019   Entrapment neuropathy of common peroneal nerve 07/20/2018   Blurring of visual image 05/28/2018   Glaucoma 02/24/2018   Hx of CABG 02/24/2018   Neuropathy 02/24/2018   Vitamin D deficiency 02/24/2018   Vitamin B12 deficiency 02/24/2018   Orthopedic aftercare 10/08/2017   S/P shoulder replacement, left 09/25/2017   Tear of left rotator cuff 08/04/2017   Osteoarthritis of left shoulder 08/04/2017   SUI (stress urinary incontinence), male 06/15/2017   Malignant neoplasm of  prostate ([place]) 04/23/2017   Chest pain 07/27/2016   Gastroesophageal reflux disease without esophagitis 07/27/2016   Hyperlipidemia 07/22/2015   Osteoarthrosis involving lower leg 02/22/2014   Current tear of medial cartilage or meniscus of knee 11/11/2013   Insomnia 03/07/2013   Coronary artery disease involving coronary bypass graft of native heart with unstable angina pectoris ([place]) 10/06/2012   Coronary atherosclerosis 05/09/2009   Essential hypertension 05/09/2009   Benign neoplasm of colon 04/13/2008   Asymptomatic varicose veins 06/23/2007   Allergic rhinitis 12/28/2006    Past Surgical History:  Procedure Laterality Date   COLONOSCOPY     CORONARY ANGIOPLASTY WITH STENT PLACEMENT  2005   x9    CORONARY ARTERY BYPASS GRAFT  2011   CORONARY STENT PLACEMENT  07/2014   ESOPHAGOGASTRODUODENOSCOPY     ESOPHAGOGASTRODUODENOSCOPY (EGD) WITH ESOPHAGEAL DILATION  2018   knee scope Bilateral    LEG SURGERY Right 09/2018   LIPOMA EXCISION N/A 09/24/2016   Procedure: EXCISION SUBCUTANEOUS LIPOMA ON UPPER BACK;  Surgeon: Donnie Mesa, MD;  Location: [place];  Service: General;  Laterality: N/A;   Prostectomy  1995   REVERSE SHOULDER ARTHROPLASTY Left 09/25/2017   REVERSE SHOULDER ARTHROPLASTY Left 09/25/2017   Procedure: LEFT REVERSE SHOULDER ARTHROPLASTY; deltoid repair;  Surgeon: Netta Cedars, MD;  Location: [place];  Service: Orthopedics;  Laterality: Left;   ROTATOR CUFF REPAIR Left 2001  Home Medications    Prior to Admission medications   Medication Sig Start Date End Date Taking? Authorizing Provider  acetaminophen (TYLENOL) 650 MG CR tablet Take 650 mg by mouth as needed for pain. 2 tablets in the morning and 2 tablets at bedtime    [provider]  amLODipine (NORVASC) 10 MG tablet Take 1 tablet (10 mg total) by mouth daily. 09/27/19   Adrian Prows, MD  aspirin EC 81 MG tablet Take 81 mg by mouth daily.    [provider]  azelastine (OPTIVAR) 0.05 %  ophthalmic solution 1 drop 2 (two) times daily 03/31/18   [provider]  baclofen (LIORESAL) 10 MG tablet Take by mouth. 07/12/21   [provider]  calcium carbonate (TUMS - DOSED IN MG ELEMENTAL CALCIUM) 500 MG chewable tablet Chew 2 tablets by mouth daily as needed for indigestion or heartburn.    [provider]  celecoxib (CELEBREX) 200 MG capsule Take by mouth. 09/02/21   [provider]  cholecalciferol (VITAMIN D) 1000 units tablet Take 1,000 Units by mouth daily.    [provider]  Cyanocobalamin (B-12) 1000 MCG/ML KIT Inject 1 mL as directed every 30 (thirty) days.    [provider]  cycloSPORINE (RESTASIS) 0.05 % ophthalmic emulsion Place 1 drop into both eyes 2 (two) times daily.    [provider]  fenofibrate (TRICOR) 145 MG tablet Take 1 tablet (145 mg total) by mouth daily. 12/31/20   Adrian Prows, MD  fluticasone (FLONASE) 50 MCG/ACT nasal spray Place 1-2 sprays into both nostrils as needed. 04/18/20   [provider]  ibuprofen (ADVIL) 600 MG tablet Take by mouth. 07/09/21   [provider]  latanoprost (XALATAN) 0.005 % ophthalmic solution Place 1 drop into both eyes at bedtime. 08/22/17   [provider]  levocetirizine (XYZAL) 5 MG tablet  05/03/20   [provider]  lisinopril (PRINIVIL,ZESTRIL) 20 MG tablet Take 20 mg by mouth 2 (two) times daily.     [provider]  meclizine (ANTIVERT) 12.5 MG tablet Take 1 tablet by mouth daily. 03/24/20   [provider]  metoprolol tartrate (LOPRESSOR) 25 MG tablet TAKE 1/2 TABLET BY MOUTH TWICE DAILY 11/08/21   Adrian Prows, MD  nitroGLYCERIN (NITROSTAT) 0.4 MG SL tablet Place 1 tablet (0.4 mg total) under the tongue every 5 (five) minutes as needed for chest pain. 07/29/21   Adrian Prows, MD  nystatin-triamcinolone ointment (MYCOLOG) APPLY TOPICALLY TO THE AFFECTED AREA TWICE DAILY 01/21/21   [provider]  ondansetron  (ZOFRAN-ODT) 4 MG disintegrating tablet ondansetron 4 mg disintegrating tablet  DISSOLVE 1 TABLET ON THE TONGUE EVERY 8 HOURS AS NEEDED FOR NAUSEA    [provider]  pantoprazole (PROTONIX) 40 MG tablet 1 tab(s) orally once a day for 90 06/24/17   [provider]  Polyethyl Glycol-Propyl Glycol (SYSTANE OP) Apply 1 drop to eye 5 (five) times daily as needed (dry eyes).    [provider]  pregabalin (LYRICA) 50 MG capsule Take 50 mg by mouth in the morning and at bedtime. 10/03/19   [provider]  REPATHA SURECLICK 951 MG/ML SOAJ ADMINISTER 1 ML UNDER THE SKIN EVERY 14 DAYS 07/30/21   Adrian Prows, MD  zolpidem (AMBIEN) 10 MG tablet Take 10 mg by mouth at bedtime.    [provider]    Family History Family History  Problem Relation Age of Onset   Alzheimer's disease Mother    Stroke Father  Parkinson's disease Sister    Cancer Sister    Obesity Sister    Diabetes Mellitus II Sister     Social History Social History   Tobacco Use   Smoking status: Never    Passive exposure: Never   Smokeless tobacco: Never  Vaping Use   Vaping Use: Never used  Substance Use Topics   Alcohol use: Yes    Alcohol/week: 7.0 standard drinks of alcohol    Types: 7 Glasses of wine per week    Comment: wine with dinner   Drug use: No     Allergies   Docosahexaenoic acid (dha), Lovaza [omega-3-acid ethyl esters], Atorvastatin, Rosuvastatin, Statins, Folic acid, Folic acid-vit U3-JSH F02, Hydrocodone, and Pravastatin   Review of Systems Review of Systems Per HPI  Physical Exam Triage Vital Signs ED Triage Vitals  Enc Vitals Group     BP 02/09/22 1043 136/72     Pulse Rate 02/09/22 1043 (!) 57     Resp 02/09/22 1043 15     Temp 02/09/22 1043 98.2 F (36.8 C)     Temp Source 02/09/22 1043 Oral     SpO2 02/09/22 1043 98 %     Weight 02/09/22 1042 203 lb (92.1 kg)     Height 02/09/22 1042 6' (1.829 m)     Head Circumference --      Peak Flow  --      Pain Score 02/09/22 1041 7     Pain Loc --      Pain Edu? --      Excl. in [place]? --    Updated Vital Signs BP 136/72 (BP Location: Left Arm)   Pulse (!) 57   Temp 98.2 F (36.8 C) (Oral)   Resp 15   Ht 6' (1.829 m)   Wt 92.1 kg   SpO2 98%   BMI 27.53 kg/m   Visual Acuity Right Eye Distance:   Left Eye Distance:   Bilateral Distance:    Right Eye Near:   Left Eye Near:    Bilateral Near:     Physical Exam Vitals and nursing note reviewed.  Constitutional:      General: He is not in acute distress.    Appearance: Normal appearance.  HENT:     Head: Normocephalic and atraumatic.  Eyes:     General:        Right eye: No discharge.        Left eye: No discharge.     Conjunctiva/sclera: Conjunctivae normal.  Pulmonary:     Effort: Pulmonary effort is normal. No respiratory distress.  Musculoskeletal:     Comments: Mild abrasion noted to the anterior right knee.  Ligaments intact.  No discrete tenderness over the right knee.  Patient has mild tenderness over the right lateral trochanter.  Also has mild tenderness of the right low back.  Neurological:     Mental Status: He is alert.  Psychiatric:        Mood and Affect: Mood normal.        Behavior: Behavior normal.    UC Treatments / Results  Labs (all labs ordered are listed, but only abnormal results are displayed) Labs Reviewed - No data to display  EKG   Radiology DG Hip Unilat W or Wo Pelvis 2-3 Views Right  Result Date: 02/09/2022 CLINICAL DATA:  Fall, hip pain EXAM: DG HIP (WITH OR WITHOUT PELVIS) 2-3V RIGHT COMPARISON:  07/24/2019 FINDINGS: There is no evidence of hip fracture or dislocation. Mild  degenerative changes of both hips. No focal bone lesion is evident. No appreciable soft tissue abnormality. IMPRESSION: 1. No acute fracture or dislocation of the right hip. 2. Mild degenerative changes of both hips. Electronically Signed   By: Davina Poke D.O.   On: 02/09/2022 11:52   DG Knee  Complete 4 Views Right  Result Date: 02/09/2022 CLINICAL DATA:  Fall, knee pain EXAM: RIGHT KNEE - COMPLETE 4+ VIEW COMPARISON:  None Available. FINDINGS: No evidence of fracture, dislocation, or joint effusion. Mild joint space narrowing is most pronounced in the medial compartment soft tissues are unremarkable. Scattered atherosclerotic vascular calcifications. IMPRESSION: Negative. Electronically Signed   By: Davina Poke D.O.   On: 02/09/2022 11:51   DG Lumbar Spine Complete  Result Date: 02/09/2022 CLINICAL DATA:  Fall, back pain EXAM: LUMBAR SPINE - COMPLETE 4+ VIEW COMPARISON:  07/06/2020 FINDINGS: Five lumbar type vertebral segments. Vertebral body heights and alignment are maintained. No fracture identified. Intervertebral disc spaces are relatively preserved. Mild degenerative endplate changes. Mild lower lumbar facet arthrosis. Abdominal aortic atherosclerosis. IMPRESSION: 1. No acute fracture or subluxation. 2. Mild multilevel degenerative changes. Electronically Signed   By: Davina Poke D.O.   On: 02/09/2022 11:50    Procedures Procedures (including critical care time)  Medications Ordered in UC Medications - No data to display  Initial Impression / Assessment and Plan / UC Course  I have reviewed the triage vital signs and the nursing notes.  Pertinent labs & imaging results that were available during my care of the patient were reviewed by me and considered in my medical decision making (see chart for details).    86 year old male presents after suffering a fall.  X-rays of the knee, hip, and lumbar spine were obtained.  X-rays were independently reviewed by me.  No acute findings.  Offered medication for patient's pain.  He declined and states that he will use his home Celebrex.  Supportive care.  Final Clinical Impressions(s) / UC Diagnoses   Final diagnoses:  Musculoskeletal pain     Discharge Instructions      Use your home medication as directed.  Xrays  were negative for acute findings.  Take care  Dr. Lacinda Axon    ED Prescriptions   None    PDMP not reviewed this encounter.   Coral Spikes, [place] 02/09/22 1209

## 2022-02-09 NOTE — ED Triage Notes (Signed)
Patient states that he fell in his kitchen on Thursday.  Patient states that he landed on his right knee.  Patient c/o pain in his right lower back, right hip, right groin and right knee pain.

## 2022-02-09 NOTE — Discharge Instructions (Signed)
Use your home medication as directed.  Xrays were negative for acute findings.  Take care  Dr. Lacinda Axon

## 2022-02-10 DIAGNOSIS — I2510 Atherosclerotic heart disease of native coronary artery without angina pectoris: Secondary | ICD-10-CM | POA: Diagnosis not present

## 2022-02-10 DIAGNOSIS — I10 Essential (primary) hypertension: Secondary | ICD-10-CM | POA: Diagnosis not present

## 2022-02-10 DIAGNOSIS — E782 Mixed hyperlipidemia: Secondary | ICD-10-CM | POA: Diagnosis not present

## 2022-02-26 DIAGNOSIS — R2241 Localized swelling, mass and lump, right lower limb: Secondary | ICD-10-CM | POA: Diagnosis not present

## 2022-03-06 DIAGNOSIS — H353211 Exudative age-related macular degeneration, right eye, with active choroidal neovascularization: Secondary | ICD-10-CM | POA: Diagnosis not present

## 2022-03-06 DIAGNOSIS — H353122 Nonexudative age-related macular degeneration, left eye, intermediate dry stage: Secondary | ICD-10-CM | POA: Diagnosis not present

## 2022-03-06 DIAGNOSIS — H43813 Vitreous degeneration, bilateral: Secondary | ICD-10-CM | POA: Diagnosis not present

## 2022-03-06 DIAGNOSIS — H43391 Other vitreous opacities, right eye: Secondary | ICD-10-CM | POA: Diagnosis not present

## 2022-03-10 DIAGNOSIS — I70211 Atherosclerosis of native arteries of extremities with intermittent claudication, right leg: Secondary | ICD-10-CM | POA: Diagnosis not present

## 2022-03-10 DIAGNOSIS — D519 Vitamin B12 deficiency anemia, unspecified: Secondary | ICD-10-CM | POA: Diagnosis not present

## 2022-03-13 DIAGNOSIS — I2510 Atherosclerotic heart disease of native coronary artery without angina pectoris: Secondary | ICD-10-CM | POA: Diagnosis not present

## 2022-03-13 DIAGNOSIS — E782 Mixed hyperlipidemia: Secondary | ICD-10-CM | POA: Diagnosis not present

## 2022-03-13 DIAGNOSIS — I10 Essential (primary) hypertension: Secondary | ICD-10-CM | POA: Diagnosis not present

## 2022-03-18 DIAGNOSIS — I351 Nonrheumatic aortic (valve) insufficiency: Secondary | ICD-10-CM | POA: Diagnosis not present

## 2022-03-18 DIAGNOSIS — I10 Essential (primary) hypertension: Secondary | ICD-10-CM | POA: Diagnosis not present

## 2022-03-18 DIAGNOSIS — I340 Nonrheumatic mitral (valve) insufficiency: Secondary | ICD-10-CM | POA: Diagnosis not present

## 2022-03-18 DIAGNOSIS — I2510 Atherosclerotic heart disease of native coronary artery without angina pectoris: Secondary | ICD-10-CM | POA: Diagnosis not present

## 2022-03-18 DIAGNOSIS — E782 Mixed hyperlipidemia: Secondary | ICD-10-CM | POA: Diagnosis not present

## 2022-04-07 DIAGNOSIS — S8000XA Contusion of unspecified knee, initial encounter: Secondary | ICD-10-CM | POA: Diagnosis not present

## 2022-04-07 DIAGNOSIS — S8010XA Contusion of unspecified lower leg, initial encounter: Secondary | ICD-10-CM | POA: Diagnosis not present

## 2022-04-07 DIAGNOSIS — M25561 Pain in right knee: Secondary | ICD-10-CM | POA: Diagnosis not present

## 2022-04-07 DIAGNOSIS — M545 Low back pain, unspecified: Secondary | ICD-10-CM | POA: Diagnosis not present

## 2022-04-07 DIAGNOSIS — M25061 Hemarthrosis, right knee: Secondary | ICD-10-CM | POA: Diagnosis not present

## 2022-04-08 DIAGNOSIS — D519 Vitamin B12 deficiency anemia, unspecified: Secondary | ICD-10-CM | POA: Diagnosis not present

## 2022-04-10 DIAGNOSIS — H353122 Nonexudative age-related macular degeneration, left eye, intermediate dry stage: Secondary | ICD-10-CM | POA: Diagnosis not present

## 2022-04-10 DIAGNOSIS — H353211 Exudative age-related macular degeneration, right eye, with active choroidal neovascularization: Secondary | ICD-10-CM | POA: Diagnosis not present

## 2022-04-12 DIAGNOSIS — I10 Essential (primary) hypertension: Secondary | ICD-10-CM | POA: Diagnosis not present

## 2022-04-12 DIAGNOSIS — E782 Mixed hyperlipidemia: Secondary | ICD-10-CM | POA: Diagnosis not present

## 2022-04-17 DIAGNOSIS — E782 Mixed hyperlipidemia: Secondary | ICD-10-CM | POA: Diagnosis not present

## 2022-04-17 DIAGNOSIS — I351 Nonrheumatic aortic (valve) insufficiency: Secondary | ICD-10-CM | POA: Diagnosis not present

## 2022-04-17 DIAGNOSIS — I219 Acute myocardial infarction, unspecified: Secondary | ICD-10-CM | POA: Diagnosis not present

## 2022-04-17 DIAGNOSIS — I2510 Atherosclerotic heart disease of native coronary artery without angina pectoris: Secondary | ICD-10-CM | POA: Diagnosis not present

## 2022-04-17 DIAGNOSIS — I340 Nonrheumatic mitral (valve) insufficiency: Secondary | ICD-10-CM | POA: Diagnosis not present

## 2022-04-17 DIAGNOSIS — I10 Essential (primary) hypertension: Secondary | ICD-10-CM | POA: Diagnosis not present

## 2022-05-06 ENCOUNTER — Ambulatory Visit
Admission: EM | Admit: 2022-05-06 | Discharge: 2022-05-06 | Disposition: A | Discharge status: Home / Self Care | Payer: Medicare HMO

## 2022-05-06 DIAGNOSIS — D1721 Benign lipomatous neoplasm of skin and subcutaneous tissue of right arm: Secondary | ICD-10-CM

## 2022-05-06 NOTE — ED Triage Notes (Signed)
Pt c/o mass along right forearm x3weeks  Pt states that it is not hard and it has not bruised. Pt is worried on if it is a blood clot and does not know if he hit his arm.

## 2022-05-06 NOTE — Discharge Instructions (Signed)
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  These are noncancerous. - You may follow-up with dermatology if you desire especially if this is getting bigger or becomes painful.  Typically these are not removed and they typically do not change. - If you do start to experience pain, swelling, redness in your arm, please go to ER to rule out blood clot but I have no suspicion for that.

## 2022-05-06 NOTE — ED Provider Notes (Signed)
[facility]    CSN: [id number] Arrival date & time: 05/06/22  1358      History   Chief Complaint Chief Complaint  Patient presents with   Mass    HPI [name] [name] is a 86 y.o. male presenting for right arm mass/swelling for the past several weeks.  He says it could have been there for months but he is not sure.  It has not changed in size.  It is not painful.  There is no surrounding redness or warmth.  He denies any known injury.  Denies any similar issues in the past.  He is concerned because the area of swelling is over a vein and he thinks it could potentially be a blood clot.  He has no history of DVT, PE.  He has not been treating condition in any way.  Patient's medical history is significant for hypertension, hyperlipidemia, multiple falls, multiple MIs and prostate cancer.  Patient regularly takes Lyrica, low-dose aspirin, Celebrex and Tylenol.  Has a history of neuropathy.    HPI  Past Medical History:  Diagnosis Date   Arthritis    Cancer [facility]) 1995   prostate ca    Coronary artery disease    Esophageal stricture 08/2016   GERD (gastroesophageal reflux disease)    Glaucoma    History of hiatal hernia    Hypertension    MI (mitral incompetence)    Peripheral neuropathy    Peripheral vascular disease ([place])    Vertigo     Patient Active Problem List   Diagnosis Date Noted   Exocrine pancreatic insufficiency 10/28/2019   Peripheral vascular disease ([place]) 10/28/2019   Absolute anemia 08/29/2019   Coronary artery disease 05/01/2019   Old myocardial infarction 05/01/2019   Tinnitus 05/01/2019   Other and unspecified peripheral vertigo 05/01/2019   Low back pain with left-sided sciatica 02/13/2019   Entrapment neuropathy of common peroneal nerve 07/20/2018   Blurring of visual image 05/28/2018   Glaucoma 02/24/2018   Hx of CABG 02/24/2018   Neuropathy 02/24/2018   Vitamin D deficiency 02/24/2018   Vitamin B12 deficiency 02/24/2018    Orthopedic aftercare 10/08/2017   S/P shoulder replacement, left 09/25/2017   Tear of left rotator cuff 08/04/2017   Osteoarthritis of left shoulder 08/04/2017   SUI (stress urinary incontinence), male 06/15/2017   Malignant neoplasm of prostate ([place]) 04/23/2017   Chest pain 07/27/2016   Gastroesophageal reflux disease without esophagitis 07/27/2016   Hyperlipidemia 07/22/2015   Osteoarthrosis involving lower leg 02/22/2014   Current tear of medial cartilage or meniscus of knee 11/11/2013   Insomnia 03/07/2013   Coronary artery disease involving coronary bypass graft of native heart with unstable angina pectoris ([place]) 10/06/2012   Coronary atherosclerosis 05/09/2009   Essential hypertension 05/09/2009   Benign neoplasm of colon 04/13/2008   Asymptomatic varicose veins 06/23/2007   Allergic rhinitis 12/28/2006    Past Surgical History:  Procedure Laterality Date   COLONOSCOPY     CORONARY ANGIOPLASTY WITH STENT PLACEMENT  2005   x9    CORONARY ARTERY BYPASS GRAFT  2011   CORONARY STENT PLACEMENT  07/2014   ESOPHAGOGASTRODUODENOSCOPY     ESOPHAGOGASTRODUODENOSCOPY (EGD) WITH ESOPHAGEAL DILATION  2018   knee scope Bilateral    LEG SURGERY Right 09/2018   LIPOMA EXCISION N/A 09/24/2016   Procedure: EXCISION SUBCUTANEOUS LIPOMA ON UPPER BACK;  Surgeon: Donnie Mesa, MD;  Location: [facility];  Service: General;  Laterality: N/A;   [place]  SHOULDER ARTHROPLASTY Left 09/25/2017   REVERSE SHOULDER ARTHROPLASTY Left 09/25/2017   Procedure: LEFT REVERSE SHOULDER ARTHROPLASTY; deltoid repair;  Surgeon: Netta Cedars, MD;  Location: [facility];  Service: Orthopedics;  Laterality: Left;   ROTATOR CUFF REPAIR Left 2001       Home Medications    Prior to Admission medications   Medication Sig Start Date End Date Taking? Authorizing Provider  acetaminophen (TYLENOL) 650 MG CR tablet Take 650 mg by mouth as needed for pain. 2 tablets in the morning and 2 tablets at bedtime    Yes [provider]  amLODipine (NORVASC) 10 MG tablet Take 1 tablet (10 mg total) by mouth daily. 09/27/19  Yes Adrian Prows, MD  aspirin EC 81 MG tablet Take 81 mg by mouth daily.   Yes [provider]  azelastine (OPTIVAR) 0.05 % ophthalmic solution 1 drop 2 (two) times daily 03/31/18  Yes [provider]  calcium carbonate (TUMS - DOSED IN MG ELEMENTAL CALCIUM) 500 MG chewable tablet Chew 2 tablets by mouth daily as needed for indigestion or heartburn.   Yes [provider]  celecoxib (CELEBREX) 200 MG capsule Take by mouth. 09/02/21  Yes [provider]  cholecalciferol (VITAMIN D) 1000 units tablet Take 1,000 Units by mouth daily.   Yes [provider]  Cyanocobalamin (B-12) 1000 MCG/ML KIT Inject 1 mL as directed every 30 (thirty) days.   Yes [provider]  fenofibrate (TRICOR) 145 MG tablet Take 1 tablet (145 mg total) by mouth daily. 12/31/20  Yes Adrian Prows, MD  fluticasone (FLONASE) 50 MCG/ACT nasal spray Place 1-2 sprays into both nostrils as needed. 04/18/20  Yes [provider]  ibuprofen (ADVIL) 600 MG tablet Take by mouth. 07/09/21  Yes [provider]  latanoprost (XALATAN) 0.005 % ophthalmic solution Place 1 drop into both eyes at bedtime. 08/22/17  Yes [provider]  levocetirizine (XYZAL) 5 MG tablet  05/03/20  Yes [provider]  lisinopril (PRINIVIL,ZESTRIL) 20 MG tablet Take 20 mg by mouth 2 (two) times daily.    Yes [provider]  meclizine (ANTIVERT) 12.5 MG tablet Take 1 tablet by mouth daily. 03/24/20  Yes [provider]  metoprolol tartrate (LOPRESSOR) 25 MG tablet TAKE 1/2 TABLET BY MOUTH TWICE DAILY 11/08/21  Yes Adrian Prows, MD  nitroGLYCERIN (NITROSTAT) 0.4 MG SL tablet Place 1 tablet (0.4 mg total) under the tongue every 5 (five) minutes as needed for chest pain. 07/29/21  Yes Adrian Prows, MD  nystatin-triamcinolone ointment (MYCOLOG) APPLY TOPICALLY TO THE  AFFECTED AREA TWICE DAILY 01/21/21  Yes [provider]  ondansetron (ZOFRAN-ODT) 4 MG disintegrating tablet ondansetron 4 mg disintegrating tablet  DISSOLVE 1 TABLET ON THE TONGUE EVERY 8 HOURS AS NEEDED FOR NAUSEA   Yes [provider]  pantoprazole (PROTONIX) 40 MG tablet 1 tab(s) orally once a day for 90 06/24/17  Yes [provider]  Polyethyl Glycol-Propyl Glycol (SYSTANE OP) Apply 1 drop to eye 5 (five) times daily as needed (dry eyes).   Yes [provider]  pregabalin (LYRICA) 50 MG capsule Take 50 mg by mouth in the morning and at bedtime. 10/03/19  Yes [provider]  REPATHA SURECLICK 568 MG/ML SOAJ ADMINISTER 1 ML UNDER THE SKIN EVERY 14 DAYS 07/30/21  Yes Adrian Prows, MD  zolpidem (AMBIEN) 10 MG tablet Take 10 mg by mouth at bedtime.   Yes [provider]  baclofen (LIORESAL) 10 MG tablet Take by mouth. 07/12/21   [provider]  cycloSPORINE (RESTASIS) 0.05 % ophthalmic emulsion Place 1 drop into both eyes 2 (two) times daily.    [provider]    Family History Family History  Problem Relation Age of Onset   Alzheimer's disease Mother    Stroke Father    Parkinson's disease Sister    Cancer Sister    Obesity Sister    Diabetes Mellitus II Sister     Social History Social History   Tobacco Use   Smoking status: Never    Passive exposure: Never   Smokeless tobacco: Never  Vaping Use   Vaping Use: Never used  Substance Use Topics   Alcohol use: Yes    Alcohol/week: 7.0 standard drinks of alcohol    Types: 7 Glasses of wine per week    Comment: wine with dinner   Drug use: No     Allergies   Docosahexaenoic acid (dha), Lovaza [omega-3-acid ethyl esters], Atorvastatin, Rosuvastatin, Statins, Folic acid, Folic acid-vit G3-TDV V61, Hydrocodone, and Pravastatin   Review of Systems Review of Systems  Musculoskeletal:  Negative for arthralgias and joint swelling.  Skin:  Negative for color  change, rash and wound.       +mass of right forearm  Neurological:  Negative for weakness and numbness.     Physical Exam Triage Vital Signs ED Triage Vitals  Enc Vitals Group     BP      Pulse      Resp      Temp      Temp src      SpO2      Weight      Height      Head Circumference      Peak Flow      Pain Score      Pain Loc      Pain Edu?      Excl. in [place]?    No data found.  Updated Vital Signs BP (!) 147/68 (BP Location: Left Arm)   Pulse (!) 58   Temp 98.6 F (37 C) (Oral)   Resp 18   Ht 6' (1.829 m)   Wt 199 lb (90.3 kg)   SpO2 98%   BMI 26.99 kg/m      Physical Exam Vitals and nursing note reviewed.  Constitutional:      General: He is not in acute distress.    Appearance: Normal appearance. He is well-developed. He is not ill-appearing.  HENT:     Head: Normocephalic and atraumatic.  Eyes:     General: No scleral icterus.    Conjunctiva/sclera: Conjunctivae normal.  Cardiovascular:     Rate and Rhythm: Normal rate.  Pulmonary:     Effort: Pulmonary effort is normal. No respiratory distress.  Musculoskeletal:     Cervical back: Neck supple.  Skin:    General: Skin is warm and dry.     Capillary Refill: Capillary refill takes less than 2 seconds.     Findings: Lesion (2 cm x 3 cm soft mass of ulnar aspect of right forearm. Non-tender) present. No bruising, ecchymosis, erythema, signs of injury or wound.  Neurological:     General: No focal deficit present.     Mental Status: He is alert. Mental status is at baseline.     Motor: No weakness.     Gait: Gait normal.  Psychiatric:        Mood and Affect: Mood normal.        Behavior: Behavior normal.  UC Treatments / Results  Labs (all labs ordered are listed, but only abnormal results are displayed) Labs Reviewed - No data to display  EKG   Radiology No results found.  Procedures Procedures (including critical care time)  Medications Ordered in UC Medications - No data  to display  Initial Impression / Assessment and Plan / UC Course  I have reviewed the triage vital signs and the nursing notes.  Pertinent labs & imaging results that were available during my care of the patient were reviewed by me and considered in my medical decision making (see chart for details).   86 year old male presents for soft mass of right forearm that he noticed a few weeks ago.  He says it is not painful.  There is no erythema, ecchymosis, lacerations or abrasions and he denies any injury.  No history of DVT or PE but he is concerned about a potential clot.  No recent change in size.  Not treating condition.  On exam patient has a skin colored soft 2 cm x 3 cm mass of the right forearm that is nontender.  No overlying erythema.  Appears to be most consistent with a lipoma.  I discussed with patient that this is a benign condition.  He asked about following up with dermatology.  Advised him that he may follow-up for second opinion if he desires.  If the area is enlarging should definitely follow-up with dermatology.  Advised going to ED if he has increased swelling, redness, pain for evaluation of possible DVT but very low suspicion for that.   Final Clinical Impressions(s) / UC Diagnoses   Final diagnoses:  Lipoma of right upper extremity     Discharge Instructions      -The mass on your arm looks to be consistent with a lipoma which is a fatty tumor.  These are noncancerous. - You may follow-up with dermatology if you desire especially if this is getting bigger or becomes painful.  Typically these are not removed and they typically do not change. - If you do start to experience pain, swelling, redness in your arm, please go to ER to rule out blood clot but I have no suspicion for that.     ED Prescriptions   None    PDMP not reviewed this encounter.   Danton Clap, PA-C 05/06/22 1559

## 2022-05-07 DIAGNOSIS — M4727 Other spondylosis with radiculopathy, lumbosacral region: Secondary | ICD-10-CM | POA: Diagnosis not present

## 2022-05-07 DIAGNOSIS — M25551 Pain in right hip: Secondary | ICD-10-CM | POA: Diagnosis not present

## 2022-05-09 DIAGNOSIS — E538 Deficiency of other specified B group vitamins: Secondary | ICD-10-CM | POA: Diagnosis not present

## 2022-05-09 DIAGNOSIS — E559 Vitamin D deficiency, unspecified: Secondary | ICD-10-CM | POA: Diagnosis not present

## 2022-05-09 DIAGNOSIS — R7303 Prediabetes: Secondary | ICD-10-CM | POA: Diagnosis not present

## 2022-05-09 DIAGNOSIS — K219 Gastro-esophageal reflux disease without esophagitis: Secondary | ICD-10-CM | POA: Diagnosis not present

## 2022-05-09 DIAGNOSIS — E782 Mixed hyperlipidemia: Secondary | ICD-10-CM | POA: Diagnosis not present

## 2022-05-09 DIAGNOSIS — G629 Polyneuropathy, unspecified: Secondary | ICD-10-CM | POA: Diagnosis not present

## 2022-05-09 DIAGNOSIS — D519 Vitamin B12 deficiency anemia, unspecified: Secondary | ICD-10-CM | POA: Diagnosis not present

## 2022-05-09 DIAGNOSIS — I10 Essential (primary) hypertension: Secondary | ICD-10-CM | POA: Diagnosis not present

## 2022-05-09 DIAGNOSIS — K8681 Exocrine pancreatic insufficiency: Secondary | ICD-10-CM | POA: Diagnosis not present

## 2022-05-09 DIAGNOSIS — I739 Peripheral vascular disease, unspecified: Secondary | ICD-10-CM | POA: Diagnosis not present

## 2022-05-09 LAB — LIPID PANEL
Cholesterol: 116 (ref 0–200)
HDL: 48 (ref 35–70)
LDL Cholesterol: 44
Triglycerides: 143 (ref 40–160)

## 2022-05-09 LAB — HEPATIC FUNCTION PANEL
ALT: 12 U/L (ref 10–40)
AST: 11 — AB (ref 14–40)
Alkaline Phosphatase: 41 (ref 25–125)
Bilirubin, Total: 0.4

## 2022-05-09 LAB — BASIC METABOLIC PANEL
BUN: 22 — AB (ref 4–21)
CO2: 20 (ref 13–22)
Chloride: 105 (ref 99–108)
Creatinine: 1.1 (ref 0.6–1.3)
Glucose: 105
Potassium: 4.6 mEq/L (ref 3.5–5.1)
Sodium: 140 (ref 137–147)

## 2022-05-09 LAB — CBC AND DIFFERENTIAL
HCT: 39 — AB (ref 41–53)
Hemoglobin: 13.1 — AB (ref 13.5–17.5)
Neutrophils Absolute: 2.2
Platelets: 196 10*3/uL (ref 150–400)
WBC: 3.8

## 2022-05-09 LAB — CBC: RBC: 4.49 (ref 3.87–5.11)

## 2022-05-09 LAB — COMPREHENSIVE METABOLIC PANEL
Albumin: 4.3 (ref 3.5–5.0)
Calcium: 9.2 (ref 8.7–10.7)
Globulin: 2
eGFR: 64

## 2022-05-09 LAB — VITAMIN D 25 HYDROXY (VIT D DEFICIENCY, FRACTURES): Vit D, 25-Hydroxy: 68.3

## 2022-05-09 LAB — VITAMIN B12: Vitamin B-12: 826

## 2022-05-09 LAB — TSH: TSH: 1.22 (ref 0.41–5.90)

## 2022-05-09 LAB — HEMOGLOBIN A1C: Hemoglobin A1C: 5.9

## 2022-05-27 DIAGNOSIS — M25561 Pain in right knee: Secondary | ICD-10-CM | POA: Diagnosis not present

## 2022-05-27 DIAGNOSIS — S82144A Nondisplaced bicondylar fracture of right tibia, initial encounter for closed fracture: Secondary | ICD-10-CM | POA: Diagnosis not present

## 2022-06-09 DIAGNOSIS — S82144D Nondisplaced bicondylar fracture of right tibia, subsequent encounter for closed fracture with routine healing: Secondary | ICD-10-CM | POA: Diagnosis not present

## 2022-06-09 DIAGNOSIS — M25561 Pain in right knee: Secondary | ICD-10-CM | POA: Diagnosis not present

## 2022-06-11 DIAGNOSIS — D519 Vitamin B12 deficiency anemia, unspecified: Secondary | ICD-10-CM | POA: Diagnosis not present

## 2022-06-12 DIAGNOSIS — E782 Mixed hyperlipidemia: Secondary | ICD-10-CM | POA: Diagnosis not present

## 2022-06-12 DIAGNOSIS — I10 Essential (primary) hypertension: Secondary | ICD-10-CM | POA: Diagnosis not present

## 2022-06-25 DIAGNOSIS — S300XXA Contusion of lower back and pelvis, initial encounter: Secondary | ICD-10-CM | POA: Diagnosis not present

## 2022-06-25 DIAGNOSIS — M545 Low back pain, unspecified: Secondary | ICD-10-CM | POA: Diagnosis not present

## 2022-06-25 DIAGNOSIS — S82144D Nondisplaced bicondylar fracture of right tibia, subsequent encounter for closed fracture with routine healing: Secondary | ICD-10-CM | POA: Diagnosis not present

## 2022-06-27 IMAGING — MR MR ABDOMEN WO/W CM MRCP
17 of 22 series · 36 of 48 positions shown · IV contrast (multihance)
Comparison: 07/06/2020

CLINICAL DATA: Pancreatic cystic lesion assessment and surveillance

EXAM:
MRI ABDOMEN WITHOUT AND WITH CONTRAST (INCLUDING MRCP)
TECHNIQUE: Multiplanar multisequence MR imaging of the abdomen was performed
both before and after the administration of intravenous contrast.
Heavily T2-weighted images of the biliary and pancreatic ducts were
obtained, and three-dimensional MRCP images were rendered by post
processing.
CONTRAST:  19mL MULTIHANCE GADOBENATE DIMEGLUMINE 529 MG/ML IV SOLN

[Series 2: T2 · coronal · 5.0mm · 1.56mm/px · 1 of 36 slices shown (1 of 4)]
[im 1/36]
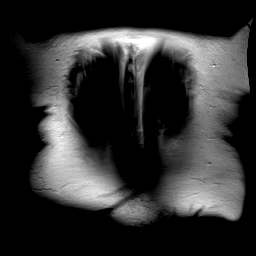

[Series 3: T1 · axial · 5.0mm · 0.74mm/px · z∈[+7,+253]mm · 2 of 84 slices shown (1 of 2)]
[im 1/84]
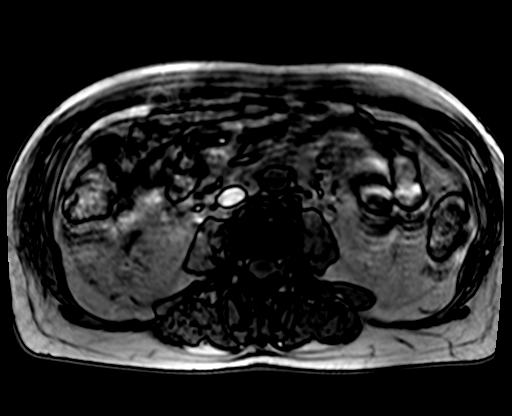
[im 84/84]
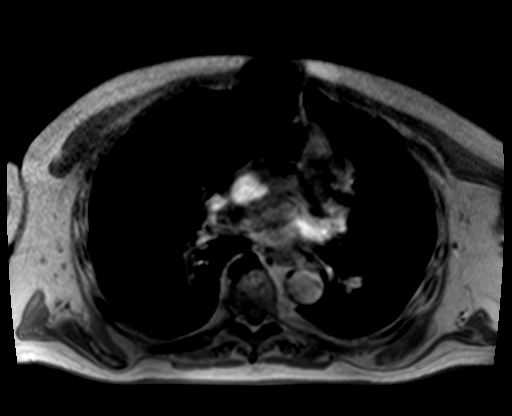

[Series 4: T1 · axial · 5.0mm · 0.74mm/px · z∈[+7,+253]mm · 2 of 84 slices shown (2 of 2)]
[im 1/84]
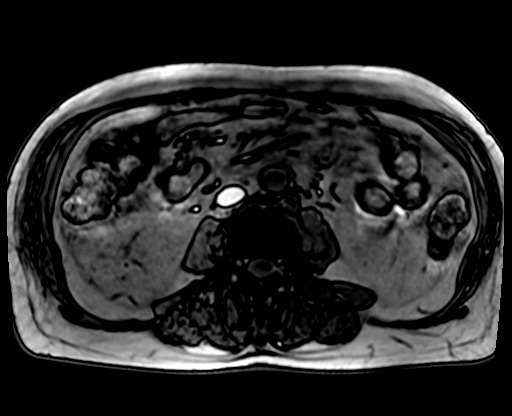
[im 84/84]
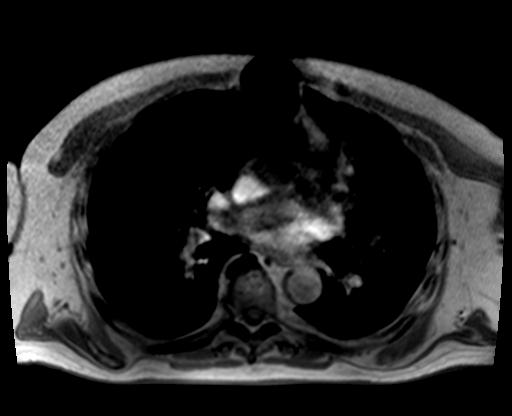

[Series 5: T2 · axial · 5.0mm · 1.48mm/px · 1 of 38 slices shown (2 of 4)]
[im 1/38]
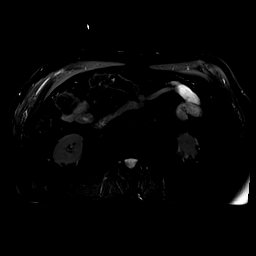

[Series 6: DWI · axial · 5.0mm · 1.42mm/px · z∈[+32,+266]mm · 4 of 120 slices shown (1 of 2)]
[im 1/120]
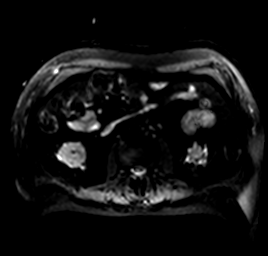
[im 40/120]
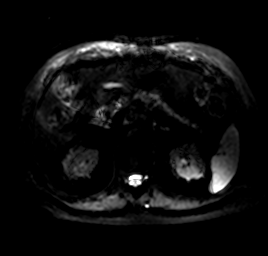
[im 80/120]
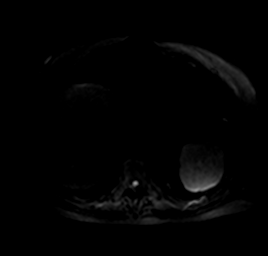
[im 120/120]
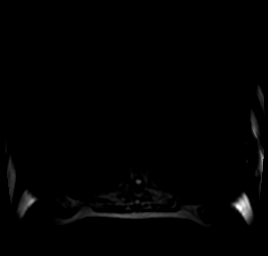

[Series 7: DWI · axial · 5.0mm · 1.42mm/px · 1 of 40 slices shown (2 of 2)]
[im 1/40]
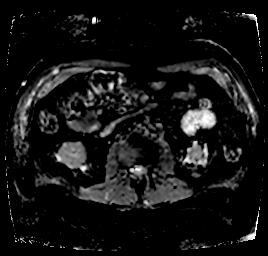

[Series 8: T2 · axial · 6.0mm · 1.22mm/px · 1 of 32 slices shown (3 of 4)]
[im 1/32]
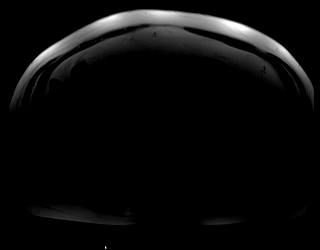

[Series 9: MRCP · coronal · 1.0mm · 0.49mm/px · 2 of 64 slices shown]
[im 1/64]
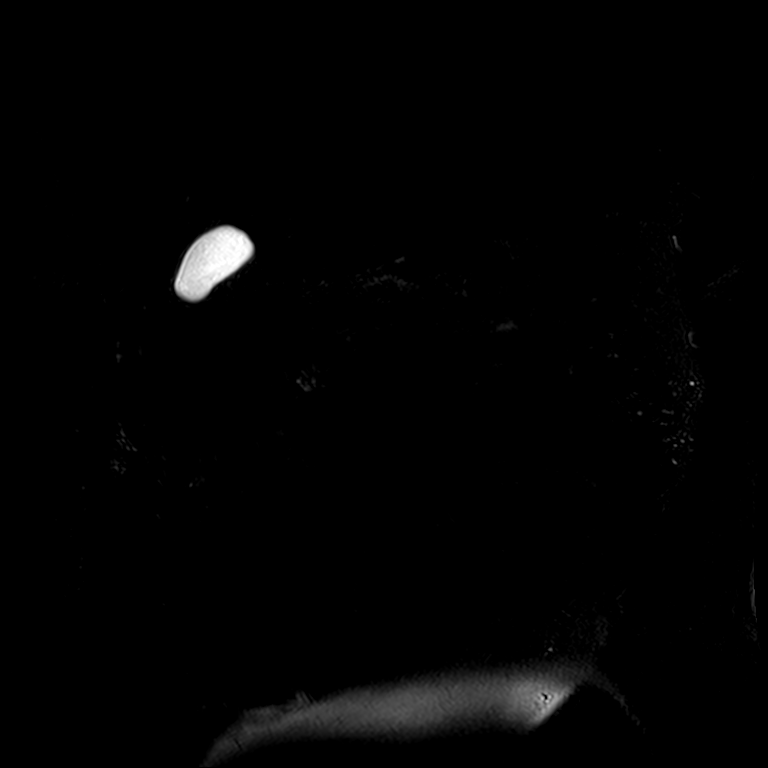
[im 64/64]
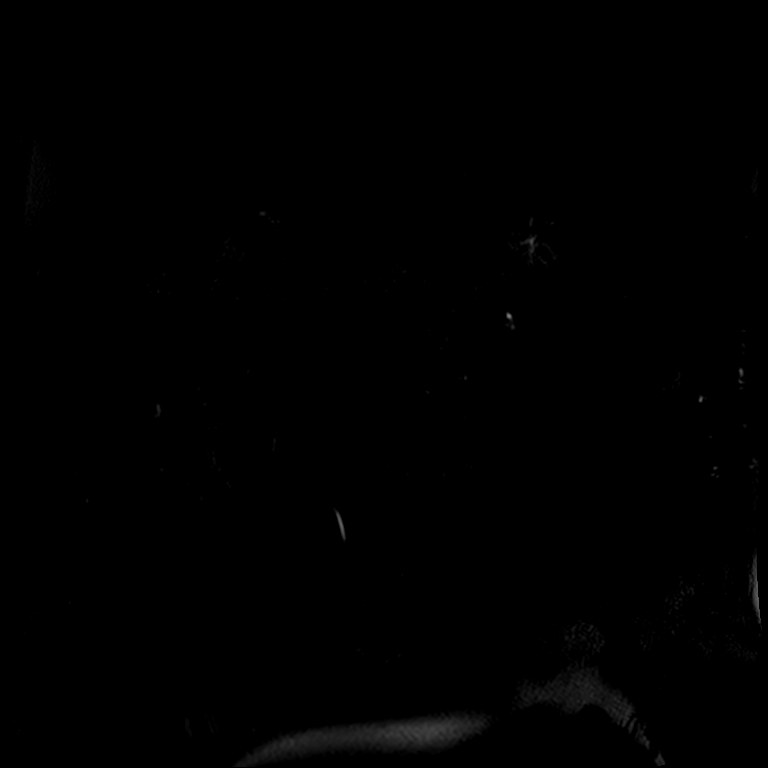

[Series 11: bSSFP · axial · 5.0mm · 1.25mm/px · 1 of 38 slices shown]
[im 1/38]
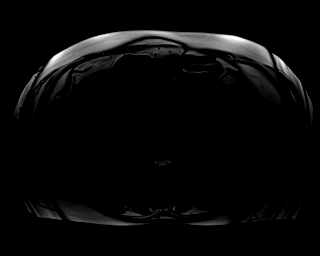

[Series 14: T2 · coronal · 3.0mm · 1.19mm/px · 1 of 19 slices shown (4 of 4)]
[im 1/19]
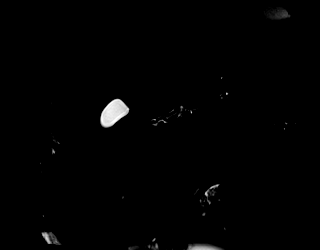

[Series 15: T1 dynamic · axial · non-contrast · 3.0mm · 1.25mm/px · z∈[+8,+245]mm · 3 of 80 slices shown]
[im 1/80]
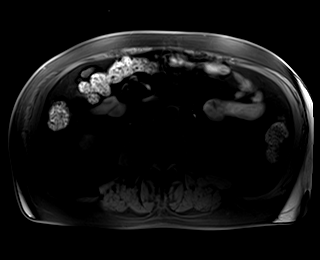
[im 40/80]
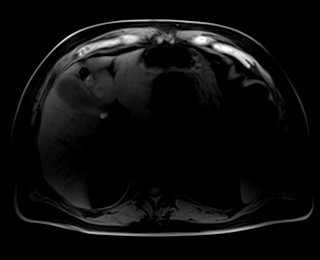
[im 80/80]
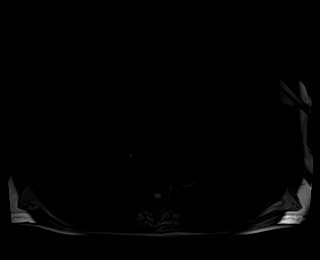

[Series 16: T1 dynamic post-contrast · axial · 3.0mm · 1.25mm/px · z∈[+8,+245]mm · 3 of 80 slices shown (1 of 6)]
[im 1/80]
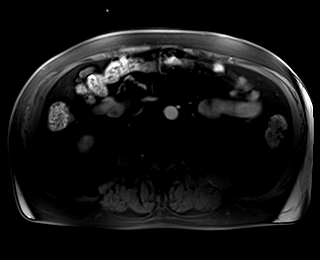
[im 40/80]
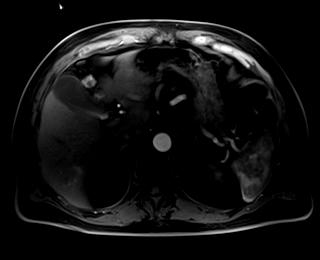
[im 80/80]
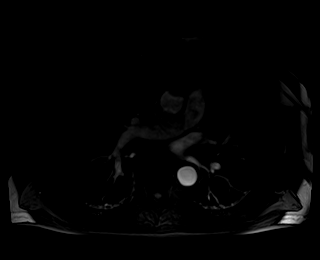

[Series 17: T1 dynamic post-contrast · axial · 3.0mm · 1.25mm/px · z∈[+8,+245]mm · 3 of 80 slices shown (2 of 6)]
[im 1/80]
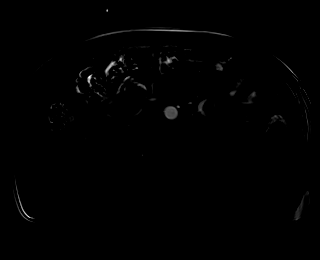
[im 40/80]
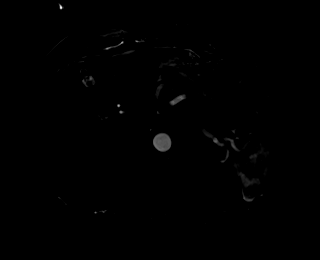
[im 80/80]
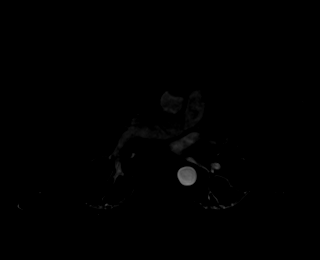

[Series 19: T1 dynamic post-contrast · axial · 3.0mm · 1.25mm/px · z∈[+8,+245]mm · 3 of 80 slices shown (3 of 6)]
[im 1/80]
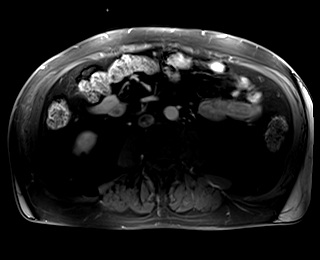
[im 40/80]
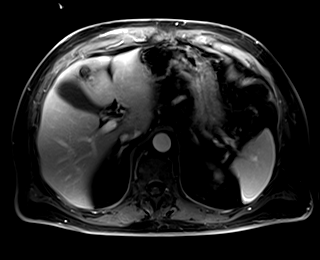
[im 80/80]
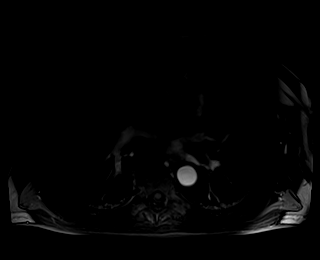

[Series 20: T1 dynamic post-contrast · axial · 3.0mm · 1.25mm/px · z∈[+8,+245]mm · 3 of 80 slices shown (4 of 6)]
[im 1/80]
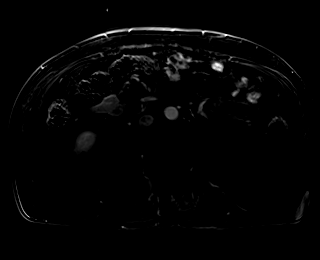
[im 40/80]
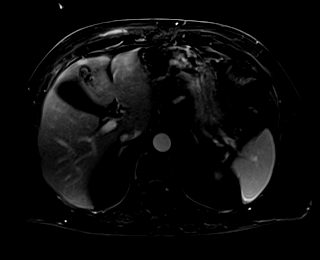
[im 80/80]
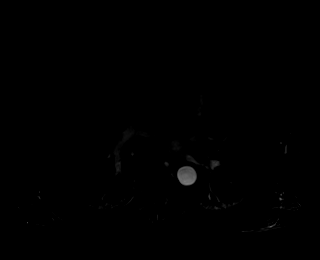

[Series 21: T1 dynamic post-contrast · axial · 3.0mm · 1.25mm/px · z∈[+8,+245]mm · 3 of 80 slices shown (5 of 6)]
[im 1/80]
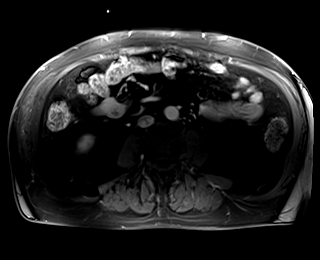
[im 40/80]
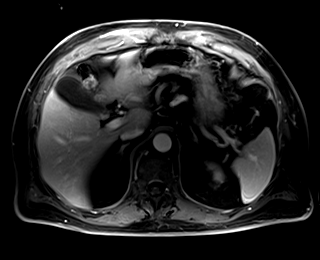
[im 80/80]
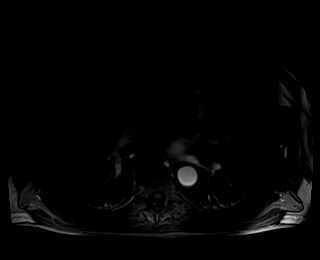

[Series 22: T1 dynamic post-contrast · axial · 3.0mm · 1.25mm/px · z∈[+8,+125]mm · 2 of 80 slices shown (6 of 6)]
[im 1/80]
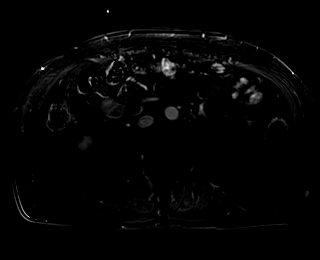
[im 40/80]
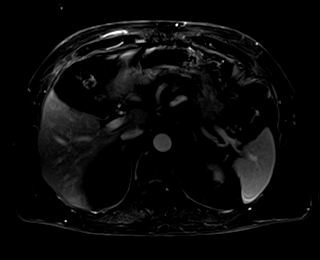

[36 of 48 positions shown; findings below may reference images not displayed]

FINDINGS: Despite efforts by the technologist and patient, motion artifact is
present on today's exam and could not be eliminated. This reduces
exam sensitivity and specificity.

Lower chest: Mild atelectasis in the posterior basal segment left
lower lobe.

Hepatobiliary: Several tiny foci of arterial phase enhancement in
the right hepatic lobe for example on image 54 of series 16 measure
up to about 6 mm in diameter. These are not appreciable on other
contrast phases or precontrast imaging and may be incidental or
represent small benign areas of focal nodular hyperplasia.

0.8 cm flash filling hemangioma in the right hepatic lobe on image
13 series 5, demonstrating early and delayed enhancement.

The gallbladder appears unremarkable.  No biliary dilatation.

Pancreas: The junction of the pancreatic body and head, and adjacent
to the dorsal pancreatic duct there is a 0.9 by 0.6 by 1.0 cm cystic
lesion with no appreciable enhancement. Connectivity to the dorsal
pancreatic duct is uncertain. The pancreas appears otherwise
unremarkable.

Spleen:  Unremarkable

Adrenals/Urinary Tract: Simple 2.8 by 2.0 cm right mid to lower
kidney cyst posteriorly. Adrenal glands normal.

Stomach/Bowel: Unremarkable

Vascular/Lymphatic:  Abdominal aortic atherosclerosis.

Other:  No supplemental non-categorized findings.

Musculoskeletal: Prior median sternotomy. Potential subacute
fractures of right lower anterior ribs, correlate with any
tenderness or prior trauma in this vicinity.
IMPRESSION: 1. 1.0 cm in long axis cystic lesion at the junction of the
pancreatic head and body. Possibilities include postinflammatory
cystic lesion or small intraductal papillary mucinous neoplasm.
Given the size of the lesion and the patient's age, follow up
pancreatic protocol MRI is recommended in 2 years time. This
recommendation follows ACR consensus guidelines: Management of
Incidental Pancreatic Cysts: A White Paper of the ACR Incidental
Findings Committee. [HOSPITAL] 3518;[DATE].
2. Sub-centimeter flash filling hemangioma in the right hepatic
lobe. Several tiny foci of arterial phase enhancement in the right
hepatic lobe are not seen on any other sequence, and may represent
small areas of focal nodular hyperplasia or small incidental foci of
vascular shunting.
3. Possible subacute fractures of right lower anterior ribs,
correlate with any tenderness or recent trauma in this vicinity.

## 2022-07-03 DIAGNOSIS — H353122 Nonexudative age-related macular degeneration, left eye, intermediate dry stage: Secondary | ICD-10-CM | POA: Diagnosis not present

## 2022-07-03 DIAGNOSIS — H353211 Exudative age-related macular degeneration, right eye, with active choroidal neovascularization: Secondary | ICD-10-CM | POA: Diagnosis not present

## 2022-07-03 DIAGNOSIS — H43813 Vitreous degeneration, bilateral: Secondary | ICD-10-CM | POA: Diagnosis not present

## 2022-07-12 DIAGNOSIS — E782 Mixed hyperlipidemia: Secondary | ICD-10-CM | POA: Diagnosis not present

## 2022-07-12 DIAGNOSIS — I10 Essential (primary) hypertension: Secondary | ICD-10-CM | POA: Diagnosis not present

## 2022-07-17 DIAGNOSIS — E782 Mixed hyperlipidemia: Secondary | ICD-10-CM | POA: Diagnosis not present

## 2022-07-17 DIAGNOSIS — I219 Acute myocardial infarction, unspecified: Secondary | ICD-10-CM | POA: Diagnosis not present

## 2022-07-17 DIAGNOSIS — I2510 Atherosclerotic heart disease of native coronary artery without angina pectoris: Secondary | ICD-10-CM | POA: Diagnosis not present

## 2022-07-17 DIAGNOSIS — I10 Essential (primary) hypertension: Secondary | ICD-10-CM | POA: Diagnosis not present

## 2022-07-17 DIAGNOSIS — I351 Nonrheumatic aortic (valve) insufficiency: Secondary | ICD-10-CM | POA: Diagnosis not present

## 2022-07-17 DIAGNOSIS — R0602 Shortness of breath: Secondary | ICD-10-CM | POA: Diagnosis not present

## 2022-07-17 DIAGNOSIS — D519 Vitamin B12 deficiency anemia, unspecified: Secondary | ICD-10-CM | POA: Diagnosis not present

## 2022-07-18 ENCOUNTER — Other Ambulatory Visit: Payer: Self-pay

## 2022-07-18 MED ORDER — REPATHA SURECLICK 140 MG/ML ~~LOC~~ SOAJ: 1.0000 mL | SUBCUTANEOUS | 3 refills | Status: DC

## 2022-07-18 MED ORDER — REPATHA SURECLICK 140 MG/ML ~~LOC~~ SOAJ: SUBCUTANEOUS | 3 refills | Status: DC

## 2022-07-24 DIAGNOSIS — R0602 Shortness of breath: Secondary | ICD-10-CM | POA: Diagnosis not present

## 2022-07-29 DIAGNOSIS — J301 Allergic rhinitis due to pollen: Secondary | ICD-10-CM | POA: Diagnosis not present

## 2022-07-29 DIAGNOSIS — H8103 Meniere's disease, bilateral: Secondary | ICD-10-CM | POA: Diagnosis not present

## 2022-08-01 DIAGNOSIS — I2510 Atherosclerotic heart disease of native coronary artery without angina pectoris: Secondary | ICD-10-CM | POA: Diagnosis not present

## 2022-08-01 DIAGNOSIS — I10 Essential (primary) hypertension: Secondary | ICD-10-CM | POA: Diagnosis not present

## 2022-08-01 DIAGNOSIS — E782 Mixed hyperlipidemia: Secondary | ICD-10-CM | POA: Diagnosis not present

## 2022-08-01 DIAGNOSIS — I351 Nonrheumatic aortic (valve) insufficiency: Secondary | ICD-10-CM | POA: Diagnosis not present

## 2022-08-01 DIAGNOSIS — I340 Nonrheumatic mitral (valve) insufficiency: Secondary | ICD-10-CM | POA: Diagnosis not present

## 2022-08-04 DIAGNOSIS — K862 Cyst of pancreas: Secondary | ICD-10-CM | POA: Diagnosis not present

## 2022-08-12 DIAGNOSIS — E782 Mixed hyperlipidemia: Secondary | ICD-10-CM | POA: Diagnosis not present

## 2022-08-12 DIAGNOSIS — I10 Essential (primary) hypertension: Secondary | ICD-10-CM | POA: Diagnosis not present

## 2022-08-12 DIAGNOSIS — I2510 Atherosclerotic heart disease of native coronary artery without angina pectoris: Secondary | ICD-10-CM | POA: Diagnosis not present

## 2022-08-18 ENCOUNTER — Encounter: Payer: Self-pay | Admitting: Family

## 2022-08-19 ENCOUNTER — Ambulatory Visit: Payer: Medicare HMO | Admitting: Family

## 2022-08-21 DIAGNOSIS — H353211 Exudative age-related macular degeneration, right eye, with active choroidal neovascularization: Secondary | ICD-10-CM | POA: Diagnosis not present

## 2022-08-25 ENCOUNTER — Ambulatory Visit (INDEPENDENT_AMBULATORY_CARE_PROVIDER_SITE_OTHER): Payer: Medicare HMO | Admitting: Family

## 2022-08-25 DIAGNOSIS — E538 Deficiency of other specified B group vitamins: Secondary | ICD-10-CM | POA: Diagnosis not present

## 2022-08-25 MED ORDER — CYANOCOBALAMIN 1000 MCG/ML IJ SOLN: 1000.0000 ug | Freq: Once | INTRAMUSCULAR | Status: DC

## 2022-08-30 ENCOUNTER — Ambulatory Visit
Admission: EM | Admit: 2022-08-30 | Discharge: 2022-08-30 | Disposition: A | Discharge status: Home / Self Care | Payer: Medicare HMO | Attending: Physician Assistant | Admitting: Physician Assistant

## 2022-08-30 DIAGNOSIS — R39198 Other difficulties with micturition: Secondary | ICD-10-CM | POA: Diagnosis not present

## 2022-08-30 DIAGNOSIS — R1030 Lower abdominal pain, unspecified: Secondary | ICD-10-CM | POA: Diagnosis not present

## 2022-08-30 DIAGNOSIS — N390 Urinary tract infection, site not specified: Secondary | ICD-10-CM | POA: Insufficient documentation

## 2022-08-30 DIAGNOSIS — M545 Low back pain, unspecified: Secondary | ICD-10-CM | POA: Diagnosis not present

## 2022-08-30 DIAGNOSIS — R6883 Chills (without fever): Secondary | ICD-10-CM | POA: Diagnosis not present

## 2022-08-30 LAB — URINALYSIS, W/ REFLEX TO CULTURE (INFECTION SUSPECTED)
Bilirubin Urine: NEGATIVE
Glucose, UA: NEGATIVE mg/dL
Hgb urine dipstick: NEGATIVE
Ketones, ur: NEGATIVE mg/dL
Leukocytes,Ua: NEGATIVE
Nitrite: NEGATIVE
Protein, ur: NEGATIVE mg/dL
Specific Gravity, Urine: 1.02 (ref 1.005–1.030)
Squamous Epithelial / HPF: NONE SEEN /HPF (ref 0–5)
pH: 5 (ref 5.0–8.0)

## 2022-08-30 MED ORDER — SULFAMETHOXAZOLE-TRIMETHOPRIM 800-160 MG PO TABS: 1.0000 | ORAL_TABLET | Freq: Two times a day (BID) | ORAL | 0 refills | Status: DC

## 2022-08-30 NOTE — ED Triage Notes (Signed)
Pt c/o body chills, lower abdominal, upper groin pain, back pain, Urinary odor x3weeks  Pt states that his urine smells extremely bad and is concerned.   Pt has not had any new foods or diet changes.  Pt did not bring his medication list with his medications.

## 2022-08-30 NOTE — Discharge Instructions (Addendum)
-  Urine today does not show any evidence of UTI but I still believe you have a urinary infection. I have sent antibiotic to pharmacy.  Increase rest and fluids. - Your symptoms should be improving over the next 2 to 3 days but if they are not or they worsen, you develop fever >100.4- you should go to the ED for further evaluation.  Otherwise, follow-up with your PCP.

## 2022-08-30 NOTE — ED Provider Notes (Signed)
[facility]    CSN: [id number] Arrival date & time: 08/30/22  1005      History   Chief Complaint Chief Complaint  Patient presents with   Urinary Tract Infection    HPI [name] [name] is a 87 y.o. male presenting for possible urinary tract infection.  He states that he has noticed a foul odor to his urine and dark color for the past 3 weeks.  He says since yesterday he has had difficulty urinating, chills, lower abdominal pain over his bladder, right-sided flank pain and low-grade fever of 100 degrees.  He denies any nausea or vomiting.  No change in his BMs.  He denies cough, congestion or sore throat but did take a COVID test and says it was negative.  History of COVID in December of last year.  Patient has a history of urinary infections.  He also has a history of prostate cancer and has had a prostatectomy.  He has no other complaints or concerns.  HPI  Past Medical History:  Diagnosis Date   Arthritis    Cancer ([place]) 1995   prostate ca    Chest pain, unspecified 07/27/2016   Coronary artery disease    Esophageal stricture 08/2016   GERD (gastroesophageal reflux disease)    Glaucoma    History of hiatal hernia    Hypertension    MI (mitral incompetence)    MI, acute, non ST segment elevation ([place]) 10/05/2012   Formatting of this note might be different from the original. Note: Unchanged - x4   Peripheral neuropathy    Peripheral vascular disease ([place])    Vertigo     Patient Active Problem List   Diagnosis Date Noted   Exocrine pancreatic insufficiency 10/28/2019   Peripheral vascular disease ([place]) 10/28/2019   Absolute anemia 08/29/2019   Coronary artery disease 05/01/2019   Old myocardial infarction 05/01/2019   Tinnitus 05/01/2019   Other and unspecified peripheral vertigo 05/01/2019   Mnire's disease 05/01/2019   Low back pain with left-sided sciatica 02/13/2019   Entrapment neuropathy of common peroneal nerve 07/20/2018   Blurring of visual  image 05/28/2018   Glaucoma 02/24/2018   Hx of CABG 02/24/2018   Neuropathy 02/24/2018   Vitamin D deficiency 02/24/2018   Vitamin B12 deficiency 02/24/2018   Orthopedic aftercare 10/08/2017   S/P shoulder replacement, left 09/25/2017   Tear of left rotator cuff 08/04/2017   Osteoarthritis of left shoulder 08/04/2017   SUI (stress urinary incontinence), male 06/15/2017   Malignant neoplasm of prostate ([place]) 04/23/2017   Chest pain 07/27/2016   Gastroesophageal reflux disease without esophagitis 07/27/2016   Hyperlipidemia 07/22/2015   Osteoarthrosis involving lower leg 02/22/2014   Current tear of medial cartilage or meniscus of knee 11/11/2013   Insomnia 03/07/2013   Coronary artery disease involving coronary bypass graft of native heart with unstable angina pectoris ([place]) 10/06/2012   Coronary atherosclerosis 05/09/2009   Essential hypertension 05/09/2009   Benign neoplasm of colon 04/13/2008   Asymptomatic varicose veins 06/23/2007   Skin sensation disturbance 06/23/2007   Allergic rhinitis 12/28/2006    Past Surgical History:  Procedure Laterality Date   COLONOSCOPY     CORONARY ANGIOPLASTY WITH STENT PLACEMENT  2005   x9    CORONARY ARTERY BYPASS GRAFT  2011   CORONARY STENT PLACEMENT  07/2014   ESOPHAGOGASTRODUODENOSCOPY     ESOPHAGOGASTRODUODENOSCOPY (EGD) WITH ESOPHAGEAL DILATION  2018   knee scope Bilateral    LEG SURGERY Right 09/2018   LIPOMA  EXCISION N/A 09/24/2016   Procedure: EXCISION SUBCUTANEOUS LIPOMA ON UPPER BACK;  Surgeon: Donnie Mesa, MD;  Location: [place];  Service: General;  Laterality: N/A;   [place] Left 09/25/2017   REVERSE SHOULDER ARTHROPLASTY Left 09/25/2017   Procedure: LEFT REVERSE SHOULDER ARTHROPLASTY; deltoid repair;  Surgeon: Netta Cedars, MD;  Location: [place];  Service: Orthopedics;  Laterality: Left;   ROTATOR CUFF REPAIR Left 2001       Home Medications    Prior to Admission medications    Medication Sig Start Date End Date Taking? Authorizing Provider  sulfamethoxazole-trimethoprim (BACTRIM DS) 800-160 MG tablet Take 1 tablet by mouth 2 (two) times daily for 10 days. 08/30/22 09/09/22 Yes Danton Clap, PA-C  acetaminophen (TYLENOL) 650 MG CR tablet Take 650 mg by mouth as needed for pain. 2 tablets in the morning and 2 tablets at bedtime    [provider]  amLODipine (NORVASC) 10 MG tablet Take 1 tablet (10 mg total) by mouth daily. 09/27/19   Adrian Prows, MD  aspirin EC 81 MG tablet Take 81 mg by mouth daily.    [provider]  azelastine (OPTIVAR) 0.05 % ophthalmic solution 1 drop 2 (two) times daily 03/31/18   [provider]  baclofen (LIORESAL) 10 MG tablet Take by mouth. 07/12/21   [provider]  calcium carbonate (TUMS - DOSED IN MG ELEMENTAL CALCIUM) 500 MG chewable tablet Chew 2 tablets by mouth daily as needed for indigestion or heartburn.    [provider]  celecoxib (CELEBREX) 200 MG capsule Take by mouth. 09/02/21   [provider]  cholecalciferol (VITAMIN D) 1000 units tablet Take 1,000 Units by mouth daily.    [provider]  Cyanocobalamin (B-12) 1000 MCG/ML KIT Inject 1 mL as directed every 30 (thirty) days.    [provider]  cycloSPORINE (RESTASIS) 0.05 % ophthalmic emulsion Place 1 drop into both eyes 2 (two) times daily.    [provider]  Evolocumab (REPATHA SURECLICK) XX123456 MG/ML SOAJ Inject 140 mg into the skin every 14 (fourteen) days. 07/18/22   Adrian Prows, MD  fenofibrate (TRICOR) 145 MG tablet Take 1 tablet (145 mg total) by mouth daily. 12/31/20   Adrian Prows, MD  fluticasone (FLONASE) 50 MCG/ACT nasal spray Place 1-2 sprays into both nostrils as needed. 04/18/20   [provider]  ibuprofen (ADVIL) 600 MG tablet Take by mouth. 07/09/21   [provider]  latanoprost (XALATAN) 0.005 % ophthalmic solution Place 1 drop into both eyes at bedtime. 08/22/17    [provider]  levocetirizine (XYZAL) 5 MG tablet  05/03/20   [provider]  lisinopril (PRINIVIL,ZESTRIL) 20 MG tablet Take 20 mg by mouth 2 (two) times daily.     [provider]  meclizine (ANTIVERT) 12.5 MG tablet Take 1 tablet by mouth daily. 03/24/20   [provider]  metoprolol tartrate (LOPRESSOR) 25 MG tablet TAKE 1/2 TABLET BY MOUTH TWICE DAILY 11/08/21   Adrian Prows, MD  nitroGLYCERIN (NITROSTAT) 0.4 MG SL tablet Place 1 tablet (0.4 mg total) under the tongue every 5 (five) minutes as needed for chest pain. 07/29/21   Adrian Prows, MD  nystatin-triamcinolone ointment (MYCOLOG) APPLY TOPICALLY TO THE AFFECTED AREA TWICE DAILY 01/21/21   [provider]  ondansetron (ZOFRAN-ODT) 4 MG disintegrating tablet ondansetron 4 mg disintegrating tablet  DISSOLVE 1 TABLET ON THE TONGUE EVERY 8 HOURS AS NEEDED FOR NAUSEA    [provider]  pantoprazole (PROTONIX) 40 MG tablet 1 tab(s) orally once a day for 90 06/24/17   [provider]  Polyethyl Glycol-Propyl Glycol (SYSTANE OP) Apply 1 drop to eye 5 (five) times daily as needed (dry eyes).    [provider]  pregabalin (LYRICA) 50 MG capsule Take 50 mg by mouth in the morning and at bedtime. 10/03/19   [provider]  zolpidem (AMBIEN) 10 MG tablet Take 10 mg by mouth at bedtime.    [provider]    Family History Family History  Problem Relation Age of Onset   Alzheimer's disease Mother    Stroke Father    Parkinson's disease Sister    Cancer Sister    Obesity Sister    Diabetes Mellitus II Sister     Social History Social History   Tobacco Use   Smoking status: Never    Passive exposure: Never   Smokeless tobacco: Never  Vaping Use   Vaping Use: Never used  Substance Use Topics   Alcohol use: Not Currently    Alcohol/week: 7.0 standard drinks of alcohol    Types: 7 Glasses of wine per week    Comment: wine with dinner   Drug use: No      Allergies   Docosahexaenoic acid (dha), Lovaza [omega-3-acid ethyl esters], Atorvastatin, Hydrocodone, Rosuvastatin, Statins, Folic acid, Folic acid-vit [id number], and Pravastatin   Review of Systems Review of Systems  Constitutional:  Positive for chills, fatigue and fever.  HENT:  Negative for sore throat.   Eyes:  Negative for discharge.  Gastrointestinal:  Positive for abdominal pain. Negative for blood in stool, constipation, diarrhea, nausea and vomiting.  Genitourinary:  Positive for difficulty urinating. Negative for dysuria, flank pain, frequency, genital sores, hematuria, penile discharge, penile pain, penile swelling, scrotal swelling, testicular pain and urgency.  Musculoskeletal:  Positive for back pain. Negative for arthralgias.  Skin:  Negative for rash.  Neurological:  Negative for weakness.     Physical Exam Triage Vital Signs ED Triage Vitals  Enc Vitals Group     BP 08/30/22 1107 136/64     Pulse Rate 08/30/22 1107 75     Resp --      Temp 08/30/22 1107 98.3 F (36.8 C)     Temp Source 08/30/22 1107 Oral     SpO2 08/30/22 1107 96 %     Weight 08/30/22 1100 200 lb (90.7 kg)     Height 08/30/22 1100 6' (1.829 m)     Head Circumference --      Peak Flow --      Pain Score 08/30/22 1100 6     Pain Loc --      Pain Edu? --      Excl. in [place]? --    No data found.  Updated Vital Signs BP 136/64 (BP Location: Left Arm)   Pulse 75   Temp 98.3 F (36.8 C) (Oral)   Ht 6' (1.829 m)   Wt 200 lb (90.7 kg)   SpO2 96%   BMI 27.12 kg/m   Physical Exam Vitals and nursing note reviewed.  Constitutional:      General: He is not in acute distress.    Appearance: Normal appearance. He is well-developed. He is not ill-appearing.  HENT:     Head: Normocephalic and atraumatic.  Eyes:     General: No scleral icterus.    Conjunctiva/sclera: Conjunctivae normal.  Cardiovascular:     Rate and Rhythm: Normal rate and  regular rhythm.     Heart sounds: Normal  heart sounds.  Pulmonary:     Effort: Pulmonary effort is normal. No respiratory distress.     Breath sounds: Normal breath sounds.  Abdominal:     Palpations: Abdomen is soft.     Tenderness: There is abdominal tenderness (suprapubic). There is right CVA tenderness. There is no left CVA tenderness.  Musculoskeletal:     Cervical back: Neck supple.  Skin:    General: Skin is warm and dry.     Capillary Refill: Capillary refill takes less than 2 seconds.  Neurological:     General: No focal deficit present.     Mental Status: He is alert. Mental status is at baseline.     Motor: No weakness.     Gait: Gait normal.  Psychiatric:        Mood and Affect: Mood normal.        Behavior: Behavior normal.      UC Treatments / Results  Labs (all labs ordered are listed, but only abnormal results are displayed) Labs Reviewed  URINALYSIS, W/ REFLEX TO CULTURE (INFECTION SUSPECTED) - Abnormal; Notable for the following components:      Result Value   Bacteria, UA FEW (*)    All other components within normal limits  URINE CULTURE    EKG   Radiology No results found.  Procedures Procedures (including critical care time)  Medications Ordered in UC Medications - No data to display  Initial Impression / Assessment and Plan / UC Course  I have reviewed the triage vital signs and the nursing notes.  Pertinent labs & imaging results that were available during my care of the patient were reviewed by me and considered in my medical decision making (see chart for details).   87 year old male presents for foul urinary odor, chills, difficulty urinating, and episode of urinary incontinence, chills, low-grade fever of 100 degrees, right-sided flank pain, suprapubic pain.  Symptoms began about 2 days ago.  History of UTIs.  Patient has a history of prostate cancer and has had a prostatectomy.  Vitals are normal and stable and he is overall well-appearing.  On exam he has suprapubic  tenderness to palpation and right CVA tenderness.  Chest clear auscultation heart regular rate and rhythm.  Urine today is clear but given his history and symptoms I have high suspicion for urinary infection so I am going to treat him and wait for results of urine culture.  Sent Bactrim DS to pharmacy.  Encourage increasing his rest and fluids.  Reviewed going to the ER for any acute worsening of symptoms or new symptoms.  Otherwise, advised him to follow-up with his PCP.   Final Clinical Impressions(s) / UC Diagnoses   Final diagnoses:  Difficulty urinating  Bilateral low back pain without sciatica, unspecified chronicity  Lower abdominal pain  Chills  Urinary tract infection without hematuria, site unspecified     Discharge Instructions      -Urine today does not show any evidence of UTI but I still believe you have a urinary infection. I have sent antibiotic to pharmacy.  Increase rest and fluids. - Your symptoms should be improving over the next 2 to 3 days but if they are not or they worsen, you develop fever >100.4- you should go to the ED for further evaluation.  Otherwise, follow-up with your PCP.     ED Prescriptions     Medication Sig Dispense Auth. Provider   sulfamethoxazole-trimethoprim (BACTRIM DS)  800-160 MG tablet Take 1 tablet by mouth 2 (two) times daily for 10 days. 20 tablet Gretta Cool      PDMP not reviewed this encounter.   Danton Clap, PA-C 08/30/22 1238

## 2022-08-31 LAB — URINE CULTURE: Culture: <10000 — AB

## 2022-09-02 ENCOUNTER — Other Ambulatory Visit: Payer: Self-pay | Admitting: Family

## 2022-09-02 ENCOUNTER — Encounter: Payer: Self-pay | Admitting: Family

## 2022-09-02 ENCOUNTER — Ambulatory Visit (INDEPENDENT_AMBULATORY_CARE_PROVIDER_SITE_OTHER): Payer: Medicare HMO | Admitting: Family

## 2022-09-02 VITALS — BP 120/60 | HR 57 | Temp 98.2°F | Ht 71.0 in | Wt 203.0 lb

## 2022-09-02 DIAGNOSIS — E538 Deficiency of other specified B group vitamins: Secondary | ICD-10-CM | POA: Diagnosis not present

## 2022-09-02 DIAGNOSIS — M4727 Other spondylosis with radiculopathy, lumbosacral region: Secondary | ICD-10-CM

## 2022-09-02 DIAGNOSIS — E559 Vitamin D deficiency, unspecified: Secondary | ICD-10-CM | POA: Diagnosis not present

## 2022-09-02 DIAGNOSIS — I25700 Atherosclerosis of coronary artery bypass graft(s), unspecified, with unstable angina pectoris: Secondary | ICD-10-CM

## 2022-09-02 DIAGNOSIS — R5383 Other fatigue: Secondary | ICD-10-CM | POA: Diagnosis not present

## 2022-09-02 DIAGNOSIS — Z6828 Body mass index (BMI) 28.0-28.9, adult: Secondary | ICD-10-CM

## 2022-09-02 DIAGNOSIS — H8109 Meniere's disease, unspecified ear: Secondary | ICD-10-CM | POA: Diagnosis not present

## 2022-09-02 DIAGNOSIS — E782 Mixed hyperlipidemia: Secondary | ICD-10-CM | POA: Diagnosis not present

## 2022-09-02 DIAGNOSIS — I739 Peripheral vascular disease, unspecified: Secondary | ICD-10-CM | POA: Diagnosis not present

## 2022-09-02 DIAGNOSIS — M1710 Unilateral primary osteoarthritis, unspecified knee: Secondary | ICD-10-CM | POA: Diagnosis not present

## 2022-09-02 DIAGNOSIS — K862 Cyst of pancreas: Secondary | ICD-10-CM | POA: Insufficient documentation

## 2022-09-02 DIAGNOSIS — R7303 Prediabetes: Secondary | ICD-10-CM | POA: Diagnosis not present

## 2022-09-02 DIAGNOSIS — H353230 Exudative age-related macular degeneration, bilateral, stage unspecified: Secondary | ICD-10-CM

## 2022-09-02 DIAGNOSIS — D519 Vitamin B12 deficiency anemia, unspecified: Secondary | ICD-10-CM | POA: Diagnosis not present

## 2022-09-02 NOTE — Progress Notes (Signed)
CHIEF COMPLAINT  B12 Shot     REASON FOR VISIT  B12 Injection     ASSESSMENT  B12 Deficiency, Unspecified     PLAN  Pt. given B12 injection in clinic.   Return for next injection per provider instructions.

## 2022-09-03 ENCOUNTER — Encounter: Payer: Self-pay | Admitting: Family

## 2022-09-03 LAB — CBC WITH DIFFERENTIAL/PLATELET
Basophils Absolute: 0 10*3/uL (ref 0.0–0.2)
Basos: 1 %
EOS (ABSOLUTE): 0.3 10*3/uL (ref 0.0–0.4)
Eos: 6 %
Hematocrit: 36.9 % — ABNORMAL LOW (ref 37.5–51.0)
Hemoglobin: 12.4 g/dL — ABNORMAL LOW (ref 13.0–17.7)
Immature Grans (Abs): 0 10*3/uL (ref 0.0–0.1)
Immature Granulocytes: 1 %
Lymphocytes Absolute: 1.1 10*3/uL (ref 0.7–3.1)
Lymphs: 25 %
MCH: 28.9 pg (ref 26.6–33.0)
MCHC: 33.6 g/dL (ref 31.5–35.7)
MCV: 86 fL (ref 79–97)
Monocytes Absolute: 0.3 10*3/uL (ref 0.1–0.9)
Monocytes: 8 %
Neutrophils Absolute: 2.6 10*3/uL (ref 1.4–7.0)
Neutrophils: 59 %
Platelets: 209 10*3/uL (ref 150–450)
RBC: 4.29 x10E6/uL (ref 4.14–5.80)
RDW: 12.3 % (ref 11.6–15.4)
WBC: 4.4 10*3/uL (ref 3.4–10.8)

## 2022-09-03 LAB — COMPREHENSIVE METABOLIC PANEL
ALT: 13 IU/L (ref 0–44)
AST: 8 IU/L (ref 0–40)
Albumin/Globulin Ratio: 2 (ref 1.2–2.2)
Albumin: 4.1 g/dL (ref 3.7–4.7)
Alkaline Phosphatase: 42 IU/L — ABNORMAL LOW (ref 44–121)
BUN/Creatinine Ratio: 16 (ref 10–24)
BUN: 22 mg/dL (ref 8–27)
Bilirubin Total: 0.3 mg/dL (ref 0.0–1.2)
CO2: 22 mmol/L (ref 20–29)
Calcium: 9.4 mg/dL (ref 8.6–10.2)
Chloride: 104 mmol/L (ref 96–106)
Creatinine, Ser: 1.34 mg/dL — ABNORMAL HIGH (ref 0.76–1.27)
Globulin, Total: 2.1 g/dL (ref 1.5–4.5)
Glucose: 81 mg/dL (ref 70–99)
Potassium: 5 mmol/L (ref 3.5–5.2)
Sodium: 141 mmol/L (ref 134–144)
Total Protein: 6.2 g/dL (ref 6.0–8.5)
eGFR: 52 mL/min/{1.73_m2} — ABNORMAL LOW (ref >59)

## 2022-09-03 LAB — TSH: TSH: 1.26 u[IU]/mL (ref 0.450–4.500)

## 2022-09-03 LAB — VITAMIN D 25 HYDROXY (VIT D DEFICIENCY, FRACTURES): Vit D, 25-Hydroxy: 64.7 ng/mL (ref 30.0–100.0)

## 2022-09-03 LAB — LIPID PANEL W/O CHOL/HDL RATIO
Cholesterol, Total: 115 mg/dL (ref 100–199)
HDL: 50 mg/dL (ref >39)
LDL Chol Calc (NIH): 45 mg/dL (ref 0–99)
Triglycerides: 111 mg/dL (ref 0–149)
VLDL Cholesterol Cal: 20 mg/dL (ref 5–40)

## 2022-09-03 LAB — VITAMIN B12: Vitamin B-12: 900 pg/mL (ref 232–1245)

## 2022-09-03 LAB — HGB A1C W/O EAG: Hgb A1c MFr Bld: 6.1 % — ABNORMAL HIGH (ref 4.8–5.6)

## 2022-09-03 NOTE — Progress Notes (Signed)
Established Patient Office Visit  Subjective:  Patient ID: [name] [name], male    DOB: June 05, 1936  Age: 87 y.o. MRN: [id number]  Chief Complaint  Patient presents with   Follow-up    3 month follow up    3 month f/u  Pt here today for 3 month.  He has been doing well in general, but he has continued to have issues with his vertigo, says that it has worsened and that he has had multiple falls in the last few weeks.   He is due for labwork.   No other concerns today.      Past Medical History:  Diagnosis Date   Arthritis    Benign neoplasm of colon 04/13/2008   Overview:  Benign Polyps Of The Large Intestine - colonoscopy 2006,dr Olin Hauser of this note might be different from the original. Formatting of this note might be different from the original. Benign Polyps Of The Large Intestine - colonoscopy 2006,dr Olin Hauser of this note might be different from the original. Overview: Benign Polyps Of The Large Intestine - colonoscopy 2006,dr    Blurring of visual image 05/28/2018   Cancer ([place]) 1995   prostate ca    Chest pain 07/27/2016   Chest pain, unspecified 07/27/2016   Coronary artery disease    Esophageal stricture 08/2016   GERD (gastroesophageal reflux disease)    Glaucoma    History of hiatal hernia    Hx of CABG 02/24/2018   Formatting of this note might be different from the original. Two-vessel bypass. Formatting of this note might be different from the original. Formatting of this note might be different from the original. Two-vessel bypass. Formatting of this note might be different from the original. Formatting of this note might be different from the original. Two-vessel bypass.   Hypertension    Malignant neoplasm of prostate ([place]) 04/23/2017   MI (mitral incompetence)    MI, acute, non ST segment elevation ([place]) 10/05/2012   Formatting of this note might be different from the original. Note: Unchanged - x4   Old myocardial infarction  05/01/2019   Note: Unchanged - x4   Orthopedic aftercare 10/08/2017   Peripheral neuropathy    Peripheral vascular disease ([place])    S/P shoulder replacement, left 09/25/2017   Skin sensation disturbance 06/23/2007   Formatting of this note might be different from the original. Tingling (Paresthesia) Formatting of this note might be different from the original. Formatting of this note might be different from the original. Tingling (Paresthesia)   Tear of meniscus of knee 11/11/2013   Formatting of this note might be different from the original. Formatting of this note might be different from the original. ICD-10 cut over Formatting of this note might be different from the original. Overview: ICD-10 cut over   Vertigo     Social History   Socioeconomic History   Marital status: Divorced    Spouse name: Not on file   Number of children: 6   Years of education: Not on file   Highest education level: Not on file  Occupational History   Not on file  Tobacco Use   Smoking status: Never    Passive exposure: Never   Smokeless tobacco: Never  Vaping Use   Vaping Use: Never used  Substance and Sexual Activity   Alcohol use: Not Currently    Alcohol/week: 7.0 standard drinks of alcohol    Types: 7 Glasses of wine per week    Comment: wine  with dinner   Drug use: No   Sexual activity: Not Currently  Other Topics Concern   Not on file  Social History Narrative   Not on file   Social Determinants of Health   Financial Resource Strain: Not on file  Food Insecurity: Not on file  Transportation Needs: Not on file  Physical Activity: Not on file  Stress: Not on file  Social Connections: Not on file  Intimate Partner Violence: Not on file    Family History  Problem Relation Age of Onset   Alzheimer's disease Mother    Stroke Father    Parkinson's disease Sister    Cancer Sister    Obesity Sister    Diabetes Mellitus II Sister     Allergies  Allergen Reactions    Docosahexaenoic Acid (Dha) Diarrhea   Lovaza [Omega-3-Acid Ethyl Esters] Diarrhea   Atorvastatin Other (See Comments) and Nausea Only    Joint pain   Hydrocodone Rash, Itching and Other (See Comments)   Ranolazine Diarrhea   Rosuvastatin Other (See Comments) and Nausea Only    Joint pain  Other reaction(s): Unknown   Statins Other (See Comments)    Leg pain    Folic Acid     Note: anxiety itching and nausea   Folic Acid-Vit [id number] Anxiety, Itching and Nausea Only   Pravastatin Nausea Only and Other (See Comments)    Review of Systems  Musculoskeletal:  Positive for falls.  Neurological:  Positive for dizziness and weakness (right leg, where it was previously broken.).  All other systems reviewed and are negative.      Objective:   BP 120/60   Pulse (!) 57   Temp 98.2 F (36.8 C)   Ht 5' 11"$  (1.803 m)   Wt 203 lb (92.1 kg)   SpO2 94%   BMI 28.31 kg/m   Vitals:   07/17/22 1023 08/01/22 1054 09/02/22 1116  BP: 120/70 120/60 120/60  Pulse: 63 62 (!) 57  Temp: 98.2 F (36.8 C)    Height: 5' 11"$  (1.803 m)  5' 11"$  (1.803 m)  Weight: 200 lb 12.8 oz (91.1 kg) 202 lb (91.6 kg) 203 lb (92.1 kg)  SpO2: 97% 96% 94%  BMI (Calculated): 28.02  28.33    Physical Exam Vitals and nursing note reviewed.  Constitutional:      Appearance: Normal appearance.  HENT:     Right Ear: Tympanic membrane normal. Decreased hearing noted.     Left Ear: Tympanic membrane normal. Decreased hearing noted.  Eyes:     Extraocular Movements: Extraocular movements intact.     Conjunctiva/sclera: Conjunctivae normal.     Pupils: Pupils are equal, round, and reactive to light.  Pulmonary:     Effort: Pulmonary effort is normal.  Musculoskeletal:        General: Normal range of motion.     Cervical back: Normal range of motion.  Neurological:     General: No focal deficit present.     Mental Status: He is alert and oriented to person, place, and time.     Sensory: Sensory deficit  (numbness/tingling in BLE) present.     Gait: Gait abnormal (stumblling to the right/backwards).      Results for orders placed or performed in visit on 09/02/22  CBC with Differential/Platelet  Result Value Ref Range   WBC 4.4 3.4 - 10.8 x10E3/uL   RBC 4.29 4.14 - 5.80 x10E6/uL   Hemoglobin 12.4 (L) 13.0 - 17.7 g/dL   Hematocrit 36.9 (  L) 37.5 - 51.0 %   MCV 86 79 - 97 fL   MCH 28.9 26.6 - 33.0 pg   MCHC 33.6 31.5 - 35.7 g/dL   RDW 12.3 11.6 - 15.4 %   Platelets 209 150 - 450 x10E3/uL   Neutrophils 59 Not Estab. %   Lymphs 25 Not Estab. %   Monocytes 8 Not Estab. %   Eos 6 Not Estab. %   Basos 1 Not Estab. %   Neutrophils Absolute 2.6 1.4 - 7.0 x10E3/uL   Lymphocytes Absolute 1.1 0.7 - 3.1 x10E3/uL   Monocytes Absolute 0.3 0.1 - 0.9 x10E3/uL   EOS (ABSOLUTE) 0.3 0.0 - 0.4 x10E3/uL   Basophils Absolute 0.0 0.0 - 0.2 x10E3/uL   Immature Granulocytes 1 Not Estab. %   Immature Grans (Abs) 0.0 0.0 - 0.1 x10E3/uL  Comprehensive metabolic panel  Result Value Ref Range   Glucose 81 70 - 99 mg/dL   BUN 22 8 - 27 mg/dL   Creatinine, Ser 1.34 (H) 0.76 - 1.27 mg/dL   eGFR 52 (L) >59 mL/min/1.73   BUN/Creatinine Ratio 16 10 - 24   Sodium 141 134 - 144 mmol/L   Potassium 5.0 3.5 - 5.2 mmol/L   Chloride 104 96 - 106 mmol/L   CO2 22 20 - 29 mmol/L   Calcium 9.4 8.6 - 10.2 mg/dL   Total Protein 6.2 6.0 - 8.5 g/dL   Albumin 4.1 3.7 - 4.7 g/dL   Globulin, Total 2.1 1.5 - 4.5 g/dL   Albumin/Globulin Ratio 2.0 1.2 - 2.2   Bilirubin Total 0.3 0.0 - 1.2 mg/dL   Alkaline Phosphatase 42 (L) 44 - 121 IU/L   AST 8 0 - 40 IU/L   ALT 13 0 - 44 IU/L  Lipid Panel w/o Chol/HDL Ratio  Result Value Ref Range   Cholesterol, Total 115 100 - 199 mg/dL   Triglycerides 111 0 - 149 mg/dL   HDL 50 >39 mg/dL   VLDL Cholesterol Cal 20 5 - 40 mg/dL   LDL Chol Calc (NIH) 45 0 - 99 mg/dL  Hgb A1c w/o eAG  Result Value Ref Range   Hgb A1c MFr Bld 6.1 (H) 4.8 - 5.6 %  TSH  Result Value Ref Range   TSH  1.260 0.450 - 4.500 uIU/mL  VITAMIN D 25 Hydroxy (Vit-D Deficiency, Fractures)  Result Value Ref Range   Vit D, 25-Hydroxy 64.7 30.0 - 100.0 ng/mL  Vitamin B12  Result Value Ref Range   Vitamin B-12 900 232 - 1,245 pg/mL  Results for orders placed or performed in visit on 09/02/22  CBC and differential  Result Value Ref Range   Hemoglobin 13.1 (A) 13.5 - 17.5   HCT 39 (A) 41 - 53   Neutrophils Absolute 2.20    Platelets 196 150 - 400 K/uL   WBC 3.8   CBC  Result Value Ref Range   RBC 4.49 3.87 - 5.11  VITAMIN D 25 Hydroxy (Vit-D Deficiency, Fractures)  Result Value Ref Range   Vit D, 25-Hydroxy 123456   Basic metabolic panel  Result Value Ref Range   Glucose 105    BUN 22 (A) 4 - 21   CO2 20 13 - 22   Creatinine 1.1 0.6 - 1.3   Potassium 4.6 3.5 - 5.1 mEq/L   Sodium 140 137 - 147   Chloride 105 99 - 108  Comprehensive metabolic panel  Result Value Ref Range   Globulin 2.0    eGFR 64  Calcium 9.2 8.7 - 10.7   Albumin 4.3 3.5 - 5.0  Lipid panel  Result Value Ref Range   Triglycerides 143 40 - 160   Cholesterol 116 0 - 200   HDL 48 35 - 70   LDL Cholesterol 44   Hepatic function panel  Result Value Ref Range   Alkaline Phosphatase 41 25 - 125   ALT 12 10 - 40 U/L   AST 11 (A) 14 - 40   Bilirubin, Total 0.4   Vitamin B12  Result Value Ref Range   Vitamin B-12 826   Hemoglobin A1c  Result Value Ref Range   Hemoglobin A1C 5.9   TSH  Result Value Ref Range   TSH 1.22 0.41 - 5.90  Medicare Annual Wellness Visits  Result Value Ref Range   HM Annual Wellness Visit See Report (in chart)     Recent Results (from the past 2160 hour(s))  Urinalysis, w/ Reflex to Culture (Infection Suspected) -Urine, Clean Catch     Status: Abnormal   Collection Time: 08/30/22 11:08 AM  Result Value Ref Range   Specimen Source URINE, CLEAN CATCH    Color, Urine YELLOW YELLOW   APPearance CLEAR CLEAR   Specific Gravity, Urine 1.020 1.005 - 1.030   pH 5.0 5.0 - 8.0   Glucose, UA  NEGATIVE NEGATIVE mg/dL   Hgb urine dipstick NEGATIVE NEGATIVE   Bilirubin Urine NEGATIVE NEGATIVE   Ketones, ur NEGATIVE NEGATIVE mg/dL   Protein, ur NEGATIVE NEGATIVE mg/dL   Nitrite NEGATIVE NEGATIVE   Leukocytes,Ua NEGATIVE NEGATIVE   Squamous Epithelial / HPF NONE SEEN 0 - 5 /HPF   WBC, UA 0-5 0 - 5 WBC/hpf    Comment: Reflex urine culture not performed if WBC <=10, OR if Squamous epithelial cells >5. If Squamous epithelial cells >5, suggest recollection.   RBC / HPF 0-5 0 - 5 RBC/hpf   Bacteria, UA FEW (A) NONE SEEN   Mucus PRESENT    Amorphous Crystal PRESENT     Comment: Performed at [facility], [address], [place], [place] [address]  Urine Culture     Status: Abnormal   Collection Time: 08/30/22 11:08 AM   Specimen: Urine, Clean Catch  Result Value Ref Range   Specimen Description      URINE, CLEAN CATCH Performed at [facility], [address], [place], [place] [address]    Special Requests      NONE Performed at [facility], [address], [place], [place] [address]    Culture (A)     <10,000 COLONIES/mL INSIGNIFICANT GROWTH Performed at [facility] [address] [place], [place] [address]    Report Status 08/31/2022 FINAL   CBC with Differential/Platelet     Status: Abnormal   Collection Time: 09/02/22 12:00 PM  Result Value Ref Range   WBC 4.4 3.4 - 10.8 x10E3/uL   RBC 4.29 4.14 - 5.80 x10E6/uL   Hemoglobin 12.4 (L) 13.0 - 17.7 g/dL   Hematocrit 36.9 (L) 37.5 - 51.0 %   MCV 86 79 - 97 fL   MCH 28.9 26.6 - 33.0 pg   MCHC 33.6 31.5 - 35.7 g/dL   RDW 12.3 11.6 - 15.4 %   Platelets 209 150 - 450 x10E3/uL   Neutrophils 59 Not Estab. %   Lymphs 25 Not Estab. %   Monocytes 8 Not Estab. %   Eos 6 Not Estab. %   Basos 1 Not Estab. %  Neutrophils Absolute 2.6 1.4 - 7.0 x10E3/uL   Lymphocytes Absolute 1.1 0.7 - 3.1 x10E3/uL   Monocytes Absolute 0.3 0.1 - 0.9 x10E3/uL   EOS (ABSOLUTE) 0.3 0.0 - 0.4  x10E3/uL   Basophils Absolute 0.0 0.0 - 0.2 x10E3/uL   Immature Granulocytes 1 Not Estab. %   Immature Grans (Abs) 0.0 0.0 - 0.1 x10E3/uL  Comprehensive metabolic panel     Status: Abnormal   Collection Time: 09/02/22 12:00 PM  Result Value Ref Range   Glucose 81 70 - 99 mg/dL   BUN 22 8 - 27 mg/dL   Creatinine, Ser 1.34 (H) 0.76 - 1.27 mg/dL   eGFR 52 (L) >59 mL/min/1.73   BUN/Creatinine Ratio 16 10 - 24   Sodium 141 134 - 144 mmol/L   Potassium 5.0 3.5 - 5.2 mmol/L   Chloride 104 96 - 106 mmol/L   CO2 22 20 - 29 mmol/L   Calcium 9.4 8.6 - 10.2 mg/dL   Total Protein 6.2 6.0 - 8.5 g/dL   Albumin 4.1 3.7 - 4.7 g/dL   Globulin, Total 2.1 1.5 - 4.5 g/dL   Albumin/Globulin Ratio 2.0 1.2 - 2.2   Bilirubin Total 0.3 0.0 - 1.2 mg/dL   Alkaline Phosphatase 42 (L) 44 - 121 IU/L   AST 8 0 - 40 IU/L   ALT 13 0 - 44 IU/L  Lipid Panel w/o Chol/HDL Ratio     Status: None   Collection Time: 09/02/22 12:00 PM  Result Value Ref Range   Cholesterol, Total 115 100 - 199 mg/dL   Triglycerides 111 0 - 149 mg/dL   HDL 50 >39 mg/dL   VLDL Cholesterol Cal 20 5 - 40 mg/dL   LDL Chol Calc (NIH) 45 0 - 99 mg/dL  Hgb A1c w/o eAG     Status: Abnormal   Collection Time: 09/02/22 12:00 PM  Result Value Ref Range   Hgb A1c MFr Bld 6.1 (H) 4.8 - 5.6 %    Comment:          Prediabetes: 5.7 - 6.4          Diabetes: >6.4          Glycemic control for adults with diabetes: <7.0   TSH     Status: None   Collection Time: 09/02/22 12:00 PM  Result Value Ref Range   TSH 1.260 0.450 - 4.500 uIU/mL  VITAMIN D 25 Hydroxy (Vit-D Deficiency, Fractures)     Status: None   Collection Time: 09/02/22 12:00 PM  Result Value Ref Range   Vit D, 25-Hydroxy 64.7 30.0 - 100.0 ng/mL    Comment: Vitamin D deficiency has been defined by the Institute of Medicine and an Endocrine Society practice guideline as a level of serum 25-OH vitamin D less than 20 ng/mL (1,2). The Endocrine Society went on to further define vitamin  D insufficiency as a level between 21 and 29 ng/mL (2). 1. IOM (Institute of Medicine). 2010. Dietary reference    intakes for calcium and D. [place]: The    [employer]. 2. Holick MF, Binkley [place], Bischoff-Ferrari HA, et al.    Evaluation, treatment, and prevention of vitamin D    deficiency: an Endocrine Society clinical practice    guideline. JCEM. 2011 Jul; 96(7):1911-30.   Vitamin B12     Status: None   Collection Time: 09/02/22 12:00 PM  Result Value Ref Range   Vitamin B-12 900 232 - 1,245 pg/mL      Assessment &  Plan:   Problem List Items Addressed This Visit     Coronary artery disease involving coronary bypass graft of native heart with unstable angina pectoris ([place])   Vitamin B12 deficiency   Peripheral vascular disease ([place])   Mnire's disease - Primary    will set up referral for vestibular rehab.  Pt. states that this has been helpful in the past.       Relevant Orders   Ambulatory referral to Physical Therapy   Bilateral exudative age-related macular degeneration (HCC)   Osteoarthrosis, localized, primary, involving lower leg (Chronic)   Lumbosacral spondylosis with radiculopathy   Relevant Medications   pregabalin ([place]) 75 MG capsule   Other Visit Diagnoses     Body mass index 28.0-28.9, adult           Return in about 3 months (around 12/01/2022).   Total time spent: 30 minutes  Mechele Claude, FNP  09/02/2022

## 2022-09-03 NOTE — Assessment & Plan Note (Signed)
will set up referral for vestibular rehab.  Pt. states that this has been helpful in the past.

## 2022-09-08 ENCOUNTER — Ambulatory Visit: Payer: Medicare HMO | Admitting: Urology

## 2022-09-11 DIAGNOSIS — I10 Essential (primary) hypertension: Secondary | ICD-10-CM | POA: Diagnosis not present

## 2022-09-11 DIAGNOSIS — I2510 Atherosclerotic heart disease of native coronary artery without angina pectoris: Secondary | ICD-10-CM | POA: Diagnosis not present

## 2022-09-11 DIAGNOSIS — E782 Mixed hyperlipidemia: Secondary | ICD-10-CM | POA: Diagnosis not present

## 2022-09-12 NOTE — Progress Notes (Unsigned)
09/15/22 12:17 PM   [name] [name] January 21, 1936 [id number]  Referring provider:  Perrin Maltese, MD [address] [place],   [address]  Urological history Prostate cancer  - s/p perineal prostatectomy in 1995 - PSA remains undetectable 09/2020  2. Urinary incontinence, male stress incontinence -PVR 45 mL  - Referred to physical therapy  - Managed with incontinence clamp on active days/condom catheters   HPI: [name] [name] is a 87 y.o.male who presents today for yearly follow up.   He presented to the [facility] care on 08/30/2022 complaining of a foul odor, dark color, difficulty urinating, chills, lower abdominal pain, right-sided flank pain and a low-grade fever of 100 degrees.  UA at the urgent care was bland and urine culture grew out less than 10,000 colonies.  He is experiencing 1-7 daytime voids, nocturia x 1-2 with a mild urge to urinate.  He does lose urine if he laughs coughs sneezes etc.  He is leaking 3 more times daily.  He is always wearing absorbent pads going through 3-5 daily.  He does engage in fluid intake restrictions and toilet mapping.  PVR 45 mL   He did try condom catheters, but unfortunately it try to scan off the tip of his penis so he did not use them anymore.  He is also no longer using the continent clamp.  He has good days where he hardly has any urinary leakage and he has had days where he can leak 5-6 times.  He is trying to remember to his pelvic physical therapy exercises.  Patient denies any modifying or aggravating factors.  Patient denies any gross hematuria, dysuria or suprapubic/flank pain.  Patient denies any fevers, chills, nausea or vomiting.     PMH: Past Medical History:  Diagnosis Date   Arthritis    Benign neoplasm of colon 04/13/2008   Overview:  Benign Polyps Of The Large Intestine - colonoscopy 2006,dr Olin Hauser of this note might be different from the original. Formatting of this note might be different from the  original. Benign Polyps Of The Large Intestine - colonoscopy 2006,dr Olin Hauser of this note might be different from the original. Overview: Benign Polyps Of The Large Intestine - colonoscopy 2006,dr    Blurring of visual image 05/28/2018   Cancer ([place]) 1995   prostate ca    Chest pain 07/27/2016   Chest pain, unspecified 07/27/2016   Coronary artery disease    Esophageal stricture 08/2016   GERD (gastroesophageal reflux disease)    Glaucoma    History of hiatal hernia    Hx of CABG 02/24/2018   Formatting of this note might be different from the original. Two-vessel bypass. Formatting of this note might be different from the original. Formatting of this note might be different from the original. Two-vessel bypass. Formatting of this note might be different from the original. Formatting of this note might be different from the original. Two-vessel bypass.   Hypertension    Malignant neoplasm of prostate ([place]) 04/23/2017   MI (mitral incompetence)    MI, acute, non ST segment elevation ([place]) 10/05/2012   Formatting of this note might be different from the original. Note: Unchanged - x4   Old myocardial infarction 05/01/2019   Note: Unchanged - x4   Orthopedic aftercare 10/08/2017   Peripheral neuropathy    Peripheral vascular disease ([place])    S/P shoulder replacement, left 09/25/2017   Skin sensation disturbance 06/23/2007   Formatting of this note might be different from  the original. Tingling (Paresthesia) Formatting of this note might be different from the original. Formatting of this note might be different from the original. Tingling (Paresthesia)   Tear of meniscus of knee 11/11/2013   Formatting of this note might be different from the original. Formatting of this note might be different from the original. ICD-10 cut over Formatting of this note might be different from the original. Overview: ICD-10 cut over   Vertigo     Surgical History: Past Surgical History:   Procedure Laterality Date   COLONOSCOPY     CORONARY ANGIOPLASTY WITH STENT PLACEMENT  2005   x9    CORONARY ARTERY BYPASS GRAFT  2011   CORONARY STENT PLACEMENT  07/2014   ESOPHAGOGASTRODUODENOSCOPY     ESOPHAGOGASTRODUODENOSCOPY (EGD) WITH ESOPHAGEAL DILATION  2018   knee scope Bilateral    LEG SURGERY Right 09/2018   LIPOMA EXCISION N/A 09/24/2016   Procedure: EXCISION SUBCUTANEOUS LIPOMA ON UPPER BACK;  Surgeon: Donnie Mesa, MD;  Location: [facility];  Service: General;  Laterality: N/A;   [place] Left 09/25/2017   REVERSE SHOULDER ARTHROPLASTY Left 09/25/2017   Procedure: LEFT REVERSE SHOULDER ARTHROPLASTY; deltoid repair;  Surgeon: Netta Cedars, MD;  Location: [facility];  Service: Orthopedics;  Laterality: Left;   ROTATOR CUFF REPAIR Left 2001    Home Medications:  Allergies as of 09/15/2022       Reactions   Docosahexaenoic Acid (dha) Diarrhea   Lovaza [omega-3-acid Ethyl Esters] Diarrhea   Atorvastatin Other (See Comments), Nausea Only   Joint pain   Hydrocodone Rash, Itching, Other (See Comments)   Ranolazine Diarrhea   Rosuvastatin Other (See Comments), Nausea Only   Joint pain Other reaction(s): Unknown   Statins Other (See Comments)   Leg pain    Folic Acid    Note: anxiety itching and nausea   Folic Acid-vit [id number] Anxiety, Itching, Nausea Only   Pravastatin Nausea Only, Other (See Comments)        Medication List        Accurate as of September 15, 2022 12:17 PM. If you have any questions, ask your nurse or doctor.          acetaminophen 650 MG CR tablet Commonly known as: TYLENOL Take 650 mg by mouth as needed for pain. 2 tablets in the morning and 2 tablets at bedtime   amLODipine 10 MG tablet Commonly known as: NORVASC Take 1 tablet (10 mg total) by mouth daily.   aspirin EC 81 MG tablet Take 81 mg by mouth daily.   azelastine 0.05 % ophthalmic solution Commonly known as: OPTIVAR 1 drop 2 (two) times  daily   calcium carbonate 500 MG chewable tablet Commonly known as: TUMS - dosed in mg elemental calcium Chew 2 tablets by mouth daily as needed for indigestion or heartburn.   celecoxib 200 MG capsule Commonly known as: CELEBREX Take by mouth.   cholecalciferol 1000 units tablet Commonly known as: VITAMIN D Take 1,000 Units by mouth daily.   cycloSPORINE 0.05 % ophthalmic emulsion Commonly known as: RESTASIS Place 1 drop into both eyes 2 (two) times daily.   fenofibrate 145 MG tablet Commonly known as: Tricor Take 1 tablet (145 mg total) by mouth daily.   fluticasone 50 MCG/ACT nasal spray Commonly known as: FLONASE Place 1-2 sprays into both nostrils as needed.   ibuprofen 600 MG tablet Commonly known as: ADVIL Take by mouth.   latanoprost 0.005 % ophthalmic solution Commonly  known as: XALATAN Place 1 drop into both eyes at bedtime.   levocetirizine 5 MG tablet Commonly known as: XYZAL   lisinopril 20 MG tablet Commonly known as: ZESTRIL Take 20 mg by mouth 2 (two) times daily.   meclizine 12.5 MG tablet Commonly known as: ANTIVERT Take 1 tablet by mouth daily.   metoprolol tartrate 25 MG tablet Commonly known as: LOPRESSOR TAKE 1/2 TABLET BY MOUTH TWICE DAILY   nitroGLYCERIN 0.4 MG SL tablet Commonly known as: NITROSTAT Place 1 tablet (0.4 mg total) under the tongue every 5 (five) minutes as needed for chest pain.   nystatin-triamcinolone ointment Commonly known as: MYCOLOG APPLY TOPICALLY TO THE AFFECTED AREA TWICE DAILY   pantoprazole 40 MG tablet Commonly known as: PROTONIX 1 tab(s) orally once a day for 90   pregabalin 75 MG capsule Commonly known as: LYRICA Take 75 mg by mouth 2 (two) times daily.   Repatha SureClick [id number] MG/ML [facility] Generic drug: Evolocumab Inject 140 mg into the skin every 14 (fourteen) days.   SYSTANE OP Apply 1 drop to eye 5 (five) times daily as needed (dry eyes).   zolpidem 10 MG tablet Commonly known as:  AMBIEN Take 10 mg by mouth at bedtime.        Allergies:  Allergies  Allergen Reactions   Docosahexaenoic Acid (Dha) Diarrhea   Lovaza [Omega-3-Acid Ethyl Esters] Diarrhea   Atorvastatin Other (See Comments) and Nausea Only    Joint pain   Hydrocodone Rash, Itching and Other (See Comments)   Ranolazine Diarrhea   Rosuvastatin Other (See Comments) and Nausea Only    Joint pain  Other reaction(s): Unknown   Statins Other (See Comments)    Leg pain    Folic Acid     Note: anxiety itching and nausea   Folic Acid-Vit [id number] Anxiety, Itching and Nausea Only   Pravastatin Nausea Only and Other (See Comments)    Family History: Family History  Problem Relation Age of Onset   Alzheimer's disease Mother    Stroke Father    Parkinson's disease Sister    Cancer Sister    Obesity Sister    Diabetes Mellitus II Sister     Social History:  reports that he has never smoked. He has never been exposed to tobacco smoke. He has never used smokeless tobacco. He reports that he does not currently use alcohol after a past usage of about 7.0 standard drinks of alcohol per week. He reports that he does not use drugs.   Physical Exam: BP (!) 143/59   Pulse (!) 51   Ht '5\' 11"'$  (1.803 m)   Wt 201 lb (91.2 kg)   BMI 28.03 kg/m   Constitutional:  Well nourished. Alert and oriented, No acute distress. HEENT: [place] AT, moist mucus membranes.  Trachea midline Cardiovascular: No clubbing, cyanosis, or edema. Respiratory: Normal respiratory effort, no increased work of breathing. Neurologic: Grossly intact, no focal deficits, moving all 4 extremities. Psychiatric: Normal mood and affect.   Laboratory Data: Hemoglobin A1c (08/2022) 6.1 Serum creatinine (08/2022) 1.34 I have reviewed the labs.   Pertinent Imaging  09/15/22 11:12  Scan Result 45    Assessment & Plan:    1. Urinary incontinence  -Managed with incontinent pads and pelvic floor exercises   Return in about 1 year  (around 09/15/2023) for OAB and PVR .  [facility] [address], [place], [place] [address] [phone]

## 2022-09-15 ENCOUNTER — Ambulatory Visit: Payer: Medicare HMO | Admitting: Urology

## 2022-09-15 ENCOUNTER — Encounter: Payer: Self-pay | Admitting: Urology

## 2022-09-15 ENCOUNTER — Other Ambulatory Visit
Admission: RE | Admit: 2022-09-15 | Discharge: 2022-09-15 | Disposition: A | Discharge status: Home / Self Care | Payer: Medicare HMO | Attending: Urology | Admitting: Urology

## 2022-09-15 VITALS — BP 143/59 | HR 51 | Ht 71.0 in | Wt 201.0 lb

## 2022-09-15 DIAGNOSIS — N393 Stress incontinence (female) (male): Secondary | ICD-10-CM | POA: Diagnosis not present

## 2022-09-15 DIAGNOSIS — N390 Urinary tract infection, site not specified: Secondary | ICD-10-CM | POA: Diagnosis not present

## 2022-09-15 LAB — URINALYSIS, COMPLETE (UACMP) WITH MICROSCOPIC
Bilirubin Urine: NEGATIVE
Glucose, UA: NEGATIVE mg/dL
Hgb urine dipstick: NEGATIVE
Ketones, ur: NEGATIVE mg/dL
Leukocytes,Ua: NEGATIVE
Nitrite: NEGATIVE
Protein, ur: NEGATIVE mg/dL
Specific Gravity, Urine: 1.015 (ref 1.005–1.030)
pH: 5.5 (ref 5.0–8.0)

## 2022-09-15 LAB — BLADDER SCAN AMB NON-IMAGING: Scan Result: 45

## 2022-09-16 ENCOUNTER — Other Ambulatory Visit: Payer: Self-pay | Admitting: Cardiovascular Disease

## 2022-09-16 DIAGNOSIS — I25700 Atherosclerosis of coronary artery bypass graft(s), unspecified, with unstable angina pectoris: Secondary | ICD-10-CM

## 2022-09-16 DIAGNOSIS — I10 Essential (primary) hypertension: Secondary | ICD-10-CM

## 2022-09-16 LAB — URINE CULTURE: Culture: NO GROWTH

## 2022-09-16 MED ORDER — FENOFIBRATE 145 MG PO TABS: 145.0000 mg | ORAL_TABLET | Freq: Every day | ORAL | 3 refills | Status: DC

## 2022-09-16 MED ORDER — AMLODIPINE BESYLATE 10 MG PO TABS: 10.0000 mg | ORAL_TABLET | Freq: Every day | ORAL | 3 refills | Status: DC

## 2022-09-16 MED ORDER — METOPROLOL TARTRATE 25 MG PO TABS: 12.5000 mg | ORAL_TABLET | Freq: Two times a day (BID) | ORAL | 0 refills | Status: DC

## 2022-09-17 DIAGNOSIS — M170 Bilateral primary osteoarthritis of knee: Secondary | ICD-10-CM | POA: Diagnosis not present

## 2022-09-17 DIAGNOSIS — G8929 Other chronic pain: Secondary | ICD-10-CM | POA: Diagnosis not present

## 2022-09-24 ENCOUNTER — Ambulatory Visit (INDEPENDENT_AMBULATORY_CARE_PROVIDER_SITE_OTHER): Payer: Medicare HMO | Admitting: Family

## 2022-09-24 DIAGNOSIS — E538 Deficiency of other specified B group vitamins: Secondary | ICD-10-CM

## 2022-09-24 MED ORDER — CYANOCOBALAMIN 1000 MCG/ML IJ SOLN
1000.0000 ug | Freq: Once | INTRAMUSCULAR | Status: AC
  Administered 2023-03-10: 1000 ug via INTRAMUSCULAR

## 2022-09-24 NOTE — Progress Notes (Signed)
CHIEF COMPLAINT  B12 Shot     REASON FOR VISIT  B12 Injection     ASSESSMENT  B12 Deficiency, Unspecified     PLAN  Pt. given B12 injection in clinic.   Return for next injection per provider instructions.   Mechele Claude, FNP 09/24/2022

## 2022-09-25 ENCOUNTER — Telehealth: Payer: Self-pay

## 2022-09-25 NOTE — Telephone Encounter (Signed)
HTN Review Call  [name] [name],[name] [name]  70 years, Male  DOB: Apr 28, 1936  M: [phone]  __________________________________________________ Hypertension Review (HC) Chart Review BP #1 reading (last): 140/92 on: 09/17/2022 BP #2 reading: 143/59 on: 09/15/2022 BP #3 reading: 120/60 on: 09/02/2022 Any of the last 3 BP > 140/90 mmHg?: Yes What recent interventions have been made by any provider to improve the patient's conditions in the last 3 months?: Consults: 07/29/22 Otolaryngology Margaretha Sheffield 08/04/22 [facility] PA Carol Ada For Cyst of pancreas No medication changes. 09/15/22 Urology McGowan, Hunt Oris, PA-C. For SUI No medication changes. 09/17/22 Ortho Surg Scarlett Presto, PA For pain. No medication changes. Office Visit: 09/02/22 Mechele Claude, FNP For follow-up. STOPPED Baclofen, Cyanocobalamin, Ondansetron, Ranolazine, and Sulfamethoxazole. CHANGED Pregabalin to 75 mg 2 times daily, Has there been any documented recent hospitalizations or ED visits since last visit with Clinical Lead?: Yes Brief Summary (including Medication and/or Diagnosis changes):: 08/30/22 [facility] For UTI. STARTED sulfamethoxazole-trimethoprim 800-160 mg 1 tablet by mouth 2 times daily for 10 days. Adherence Review Does the [facility] have access to medication refill data?: Yes Adherence rates for STAR metric medications: Lisinopril 20 mg - 09/17/22 30 DS Adherence rates for medications indicated for disease state being reviewed: Lisinopril 20 mg - 09/17/22 30 DS Does the patient have >5 day gap between last estimated fill dates for any of the above medications?: No Disease State Questions Able to connect with the Patient?: Yes Is the patient monitoring his/her BP?: Yes How often are you checking your BP?: daily Home BP Reading #1 (most recent): 131/59 P52 Home BP Reading #2: 133/57 P49 Home BP Reading #3: 132/60 P47 Is the patient having any low BP  Readings <90/60?: No Is the patient having any BP readings above >180/100?: No Is the patient's average BP>140/90?: No What is your blood pressure goal?: 120/80 Educate patient to inform proper points on checking BP at home:: Do not drink caffeine or smoke a cigarette at least 30 min. prior to checking., Sit with feet flat on the floor, arm at heart level. What diet changes have you made to improve your Blood Pressure Control?: eating more home-cooked meals, limiting / monitoring salt intake What exercise are you doing to improve your Blood Pressure Control?: walking Engagement Notes Charlann Lange on 09/24/2022 11:29 AM HC Chart Review: 15 min 09/24/22 HC Assessment call time spent: 10 min 09/24/22   Clinical Lead Review Review Adherence gaps identified?: No Drug Therapy Problems identified?: No Assessment: Controlled Engagement Notes Jerral Ralph on 09/25/2022 08:39 AM Reviewed 51mns

## 2022-09-29 ENCOUNTER — Ambulatory Visit: Payer: Medicare HMO

## 2022-09-29 DIAGNOSIS — H2513 Age-related nuclear cataract, bilateral: Secondary | ICD-10-CM | POA: Diagnosis not present

## 2022-09-29 DIAGNOSIS — Z0100 Encounter for examination of eyes and vision without abnormal findings: Secondary | ICD-10-CM | POA: Diagnosis not present

## 2022-09-29 DIAGNOSIS — H353211 Exudative age-related macular degeneration, right eye, with active choroidal neovascularization: Secondary | ICD-10-CM | POA: Diagnosis not present

## 2022-09-29 DIAGNOSIS — H401130 Primary open-angle glaucoma, bilateral, stage unspecified: Secondary | ICD-10-CM | POA: Diagnosis not present

## 2022-09-29 DIAGNOSIS — H353124 Nonexudative age-related macular degeneration, left eye, advanced atrophic with subfoveal involvement: Secondary | ICD-10-CM | POA: Diagnosis not present

## 2022-09-29 DIAGNOSIS — H401131 Primary open-angle glaucoma, bilateral, mild stage: Secondary | ICD-10-CM | POA: Diagnosis not present

## 2022-10-02 DIAGNOSIS — H2513 Age-related nuclear cataract, bilateral: Secondary | ICD-10-CM | POA: Diagnosis not present

## 2022-10-02 DIAGNOSIS — H353211 Exudative age-related macular degeneration, right eye, with active choroidal neovascularization: Secondary | ICD-10-CM | POA: Diagnosis not present

## 2022-10-02 DIAGNOSIS — H43813 Vitreous degeneration, bilateral: Secondary | ICD-10-CM | POA: Diagnosis not present

## 2022-10-02 DIAGNOSIS — H353122 Nonexudative age-related macular degeneration, left eye, intermediate dry stage: Secondary | ICD-10-CM | POA: Diagnosis not present

## 2022-10-03 DIAGNOSIS — M48062 Spinal stenosis, lumbar region with neurogenic claudication: Secondary | ICD-10-CM | POA: Diagnosis not present

## 2022-10-12 DIAGNOSIS — E782 Mixed hyperlipidemia: Secondary | ICD-10-CM | POA: Diagnosis not present

## 2022-10-12 DIAGNOSIS — I2510 Atherosclerotic heart disease of native coronary artery without angina pectoris: Secondary | ICD-10-CM | POA: Diagnosis not present

## 2022-10-12 DIAGNOSIS — I10 Essential (primary) hypertension: Secondary | ICD-10-CM | POA: Diagnosis not present

## 2022-10-17 DIAGNOSIS — G8929 Other chronic pain: Secondary | ICD-10-CM | POA: Diagnosis not present

## 2022-10-17 DIAGNOSIS — M48062 Spinal stenosis, lumbar region with neurogenic claudication: Secondary | ICD-10-CM | POA: Diagnosis not present

## 2022-10-17 DIAGNOSIS — M5441 Lumbago with sciatica, right side: Secondary | ICD-10-CM | POA: Diagnosis not present

## 2022-10-27 ENCOUNTER — Ambulatory Visit (INDEPENDENT_AMBULATORY_CARE_PROVIDER_SITE_OTHER): Payer: Medicare HMO | Admitting: Family

## 2022-10-27 DIAGNOSIS — E538 Deficiency of other specified B group vitamins: Secondary | ICD-10-CM

## 2022-10-27 MED ORDER — CYANOCOBALAMIN 1000 MCG/ML IJ SOLN
1000.0000 ug | Freq: Once | INTRAMUSCULAR | Status: AC
  Administered 2022-10-27: 1000 ug via INTRAMUSCULAR

## 2022-10-27 NOTE — Progress Notes (Signed)
  CHIEF COMPLAINT  B12 Shot     REASON FOR VISIT  B12 Injection     ASSESSMENT  B12 Deficiency, Unspecified     PLAN  Pt. given B12 injection in clinic.   Return for next injection per provider instructions.   Total time spent: 10 minutes  Miki Kins, FNP  10/27/2022

## 2022-11-03 DIAGNOSIS — H2513 Age-related nuclear cataract, bilateral: Secondary | ICD-10-CM | POA: Diagnosis not present

## 2022-11-03 DIAGNOSIS — H401130 Primary open-angle glaucoma, bilateral, stage unspecified: Secondary | ICD-10-CM | POA: Diagnosis not present

## 2022-11-11 ENCOUNTER — Telehealth: Payer: Self-pay

## 2022-11-11 DIAGNOSIS — I2510 Atherosclerotic heart disease of native coronary artery without angina pectoris: Secondary | ICD-10-CM | POA: Diagnosis not present

## 2022-11-11 DIAGNOSIS — I10 Essential (primary) hypertension: Secondary | ICD-10-CM | POA: Diagnosis not present

## 2022-11-11 DIAGNOSIS — E782 Mixed hyperlipidemia: Secondary | ICD-10-CM | POA: Diagnosis not present

## 2022-11-11 NOTE — Telephone Encounter (Signed)
General Review Call  [name] [name],[name] [name]  87 years, Male  DOB: 29-Oct-1935  M: [phone]  __________________________________________________ General Review [facility]) Chart Review Have there been any documented new, changed, or discontinued medications since last visit?  (Include name, dose, frequency, date): No Has there been any documented recent hospitalizations or ED visits since last visit with Clinical Lead?: No Adherence Review Does the [facility] have access to medication refill data?: No Disease State Questions Able to connect with the Patient?: Yes Did patient have any problems with their health recently?: Yes Note Problems and Concerns:: Neuropathy is getting worse Have you had any admissions or emergency room visits or worsening of your condition(s) since last visit?: No Have you had any visits with new specialists or providers since your last visit?: Yes Explain:: Physical therapy Have you had any new health care problem(s) since your last visit?: No Have you run out of any of your medications since you last spoke with your clinical lead?: No Are there any medications you are not taking as prescribed?: No Are you having any issues or side effects with your medications?: Yes Note issues or side effects:: Not using Flonase as much, concerned it may be causing vertigo Do you have any other health concerns or questions you want to discuss with your Clinical lead  before your next visit?: No Are there any health concerns that you feel we can do a better job addressing?: No Are you having any problems with any of the following since the last visit: (select all that apply): Ambulating/walking Details: Neuropathy is so bad, using a walker right now, but feels like may not be able to walk much longer. Any falls since last visit?: No Any increased or uncontrolled pain since last visit?: Yes Details: Legs/ Feet Next visit Type:: Clinic Visit with:: [facility] Date:: 11/27/2022 Time:: Unknown- with Dr.  Welton Flakes Additional Details: Patient states to call if you need him. Engagement Notes dimock, eugenia on 11/11/2022 12:37 PM CCM Billed Time: HC Chart Review/ phone call: 10 minutes (11/11/22) Clinical Lead Review Review Adherence gaps identified?: No Drug Therapy Problems identified?: No Assessment: No new needs identified. Follow-up as scheduled . Philo Kurtz,PharmD  Reviewed 

## 2022-11-12 DIAGNOSIS — Z85828 Personal history of other malignant neoplasm of skin: Secondary | ICD-10-CM | POA: Diagnosis not present

## 2022-11-12 DIAGNOSIS — L570 Actinic keratosis: Secondary | ICD-10-CM | POA: Diagnosis not present

## 2022-11-12 DIAGNOSIS — Z872 Personal history of diseases of the skin and subcutaneous tissue: Secondary | ICD-10-CM | POA: Diagnosis not present

## 2022-11-12 DIAGNOSIS — D0439 Carcinoma in situ of skin of other parts of face: Secondary | ICD-10-CM | POA: Diagnosis not present

## 2022-11-12 DIAGNOSIS — L821 Other seborrheic keratosis: Secondary | ICD-10-CM | POA: Diagnosis not present

## 2022-11-12 DIAGNOSIS — L578 Other skin changes due to chronic exposure to nonionizing radiation: Secondary | ICD-10-CM | POA: Diagnosis not present

## 2022-11-12 DIAGNOSIS — D485 Neoplasm of uncertain behavior of skin: Secondary | ICD-10-CM | POA: Diagnosis not present

## 2022-11-13 ENCOUNTER — Other Ambulatory Visit: Payer: Self-pay | Admitting: Family

## 2022-11-20 DIAGNOSIS — H2513 Age-related nuclear cataract, bilateral: Secondary | ICD-10-CM | POA: Diagnosis not present

## 2022-11-20 DIAGNOSIS — H353122 Nonexudative age-related macular degeneration, left eye, intermediate dry stage: Secondary | ICD-10-CM | POA: Diagnosis not present

## 2022-11-20 DIAGNOSIS — H43813 Vitreous degeneration, bilateral: Secondary | ICD-10-CM | POA: Diagnosis not present

## 2022-11-20 DIAGNOSIS — M7989 Other specified soft tissue disorders: Secondary | ICD-10-CM

## 2022-11-20 DIAGNOSIS — H353211 Exudative age-related macular degeneration, right eye, with active choroidal neovascularization: Secondary | ICD-10-CM | POA: Diagnosis not present

## 2022-11-24 ENCOUNTER — Telehealth: Payer: Self-pay

## 2022-11-24 NOTE — Telephone Encounter (Signed)
Pt called and left vm regarding that he has an inner ear infection, said you have sent rx for this before & asked if you can send rx in to [facility] for him. Please advise

## 2022-11-25 ENCOUNTER — Other Ambulatory Visit: Payer: Self-pay

## 2022-11-25 MED ORDER — AZITHROMYCIN 250 MG PO TABS: ORAL_TABLET | ORAL | 0 refills | Status: DC

## 2022-11-26 ENCOUNTER — Ambulatory Visit: Payer: Medicare HMO

## 2022-11-27 ENCOUNTER — Ambulatory Visit: Payer: Medicare HMO | Admitting: Cardiovascular Disease

## 2022-12-01 ENCOUNTER — Encounter: Payer: Self-pay | Admitting: Family

## 2022-12-01 ENCOUNTER — Ambulatory Visit (INDEPENDENT_AMBULATORY_CARE_PROVIDER_SITE_OTHER): Payer: Medicare HMO | Admitting: Family

## 2022-12-01 VITALS — BP 118/62 | HR 54 | Ht 71.0 in | Wt 204.2 lb

## 2022-12-01 DIAGNOSIS — Z1211 Encounter for screening for malignant neoplasm of colon: Secondary | ICD-10-CM

## 2022-12-01 DIAGNOSIS — I739 Peripheral vascular disease, unspecified: Secondary | ICD-10-CM | POA: Diagnosis not present

## 2022-12-01 DIAGNOSIS — I10 Essential (primary) hypertension: Secondary | ICD-10-CM

## 2022-12-01 DIAGNOSIS — I25700 Atherosclerosis of coronary artery bypass graft(s), unspecified, with unstable angina pectoris: Secondary | ICD-10-CM

## 2022-12-01 DIAGNOSIS — E538 Deficiency of other specified B group vitamins: Secondary | ICD-10-CM | POA: Diagnosis not present

## 2022-12-01 DIAGNOSIS — H353230 Exudative age-related macular degeneration, bilateral, stage unspecified: Secondary | ICD-10-CM | POA: Diagnosis not present

## 2022-12-01 DIAGNOSIS — E782 Mixed hyperlipidemia: Secondary | ICD-10-CM

## 2022-12-01 MED ORDER — CYANOCOBALAMIN 1000 MCG/ML IJ SOLN
1000.0000 ug | Freq: Once | INTRAMUSCULAR | Status: AC
  Administered 2022-12-01: 1000 ug via INTRAMUSCULAR

## 2022-12-01 MED ORDER — AZITHROMYCIN 250 MG PO TABS: ORAL_TABLET | ORAL | 0 refills | Status: DC

## 2022-12-01 NOTE — Assessment & Plan Note (Signed)
Patient is managed by cardiology, and is seen for follow ups frequently.  Will defer to them for further treatment.   Reassess at follow up

## 2022-12-01 NOTE — Assessment & Plan Note (Signed)
Checking labs today.  Continue current therapy for lipid control. Will modify as needed based on labwork results.  

## 2022-12-01 NOTE — Assessment & Plan Note (Signed)
.  bvitam

## 2022-12-01 NOTE — Assessment & Plan Note (Signed)
Patient is followed by ophthalmology, he gets injections regularly.  Will continue to defer to them for treatment.

## 2022-12-01 NOTE — Progress Notes (Addendum)
Established Patient Office Visit  Subjective:  Patient ID: [name] [name], male    DOB: 29-Jun-1936  Age: 87 y.o. MRN: [id number]  Chief Complaint  Patient presents with   Follow-up    3 month follow up    Patient is here today for his 3 months follow up.  He has been feeling well since last appointment.   He does have additional concerns to discuss today.  He says that he felt better with the antibiotics that we sent for his ears, and says that they have helped deal with his vertigo.   Labs are due today. He needs refills.   I have reviewed his active problem list, medication list, allergies, notes from last encounter, and lab results for his appointment today.    No other concerns at this time.   Past Medical History:  Diagnosis Date   Arthritis    Benign neoplasm of colon 04/13/2008   Overview:  Benign Polyps Of The Large Intestine - colonoscopy 2006,dr Nance Pear of this note might be different from the original. Formatting of this note might be different from the original. Benign Polyps Of The Large Intestine - colonoscopy 2006,dr Nance Pear of this note might be different from the original. Overview: Benign Polyps Of The Large Intestine - colonoscopy 2006,dr    Blurring of visual image 05/28/2018   Cancer (HCC) 1995   prostate ca    Chest pain 07/27/2016   Chest pain, unspecified 07/27/2016   Coronary artery disease    Esophageal stricture 08/2016   GERD (gastroesophageal reflux disease)    Glaucoma    History of hiatal hernia    Hx of CABG 02/24/2018   Formatting of this note might be different from the original. Two-vessel bypass. Formatting of this note might be different from the original. Formatting of this note might be different from the original. Two-vessel bypass. Formatting of this note might be different from the original. Formatting of this note might be different from the original. Two-vessel bypass.   Hypertension    Malignant neoplasm  of prostate (HCC) 04/23/2017   MI (mitral incompetence)    MI, acute, non ST segment elevation (HCC) 10/05/2012   Formatting of this note might be different from the original. Note: Unchanged - x4   Old myocardial infarction 05/01/2019   Note: Unchanged - x4   Orthopedic aftercare 10/08/2017   Peripheral neuropathy    Peripheral vascular disease (HCC)    S/P shoulder replacement, left 09/25/2017   Skin sensation disturbance 06/23/2007   Formatting of this note might be different from the original. Tingling (Paresthesia) Formatting of this note might be different from the original. Formatting of this note might be different from the original. Tingling (Paresthesia)   Tear of meniscus of knee 11/11/2013   Formatting of this note might be different from the original. Formatting of this note might be different from the original. ICD-10 cut over Formatting of this note might be different from the original. Overview: ICD-10 cut over   Vertigo     Past Surgical History:  Procedure Laterality Date   COLONOSCOPY     CORONARY ANGIOPLASTY WITH STENT PLACEMENT  2005   x9    CORONARY ARTERY BYPASS GRAFT  2011   CORONARY STENT PLACEMENT  07/2014   ESOPHAGOGASTRODUODENOSCOPY     ESOPHAGOGASTRODUODENOSCOPY (EGD) WITH ESOPHAGEAL DILATION  2018   knee scope Bilateral    LEG SURGERY Right 09/2018   LIPOMA EXCISION N/A 09/24/2016   Procedure: EXCISION SUBCUTANEOUS  LIPOMA ON UPPER BACK;  Surgeon: Manus Rudd, MD;  Location: [facility] OR;  Service: General;  Laterality: N/A;   Prostectomy  1995   REVERSE SHOULDER ARTHROPLASTY Left 09/25/2017   REVERSE SHOULDER ARTHROPLASTY Left 09/25/2017   Procedure: LEFT REVERSE SHOULDER ARTHROPLASTY; deltoid repair;  Surgeon: Beverely Low, MD;  Location: [facility] OR;  Service: Orthopedics;  Laterality: Left;   ROTATOR CUFF REPAIR Left 2001    Social History   Socioeconomic History   Marital status: Divorced    Spouse name: Not on file   Number of children: 6   Years of  education: Not on file   Highest education level: Not on file  Occupational History   Not on file  Tobacco Use   Smoking status: Never    Passive exposure: Never   Smokeless tobacco: Never  Vaping Use   Vaping Use: Never used  Substance and Sexual Activity   Alcohol use: Not Currently    Alcohol/week: 7.0 standard drinks of alcohol    Types: 7 Glasses of wine per week    Comment: wine with dinner   Drug use: No   Sexual activity: Not Currently  Other Topics Concern   Not on file  Social History Narrative   Not on file   Social Determinants of Health   Financial Resource Strain: Not on file  Food Insecurity: Not on file  Transportation Needs: Not on file  Physical Activity: Not on file  Stress: Not on file  Social Connections: Not on file  Intimate Partner Violence: Not on file    Family History  Problem Relation Age of Onset   Alzheimer's disease Mother    Stroke Father    Parkinson's disease Sister    Cancer Sister    Obesity Sister    Diabetes Mellitus II Sister     Allergies  Allergen Reactions   Docosahexaenoic Acid (Dha) Diarrhea   Lovaza [Omega-3-Acid Ethyl Esters] Diarrhea   Atorvastatin Other (See Comments) and Nausea Only    Joint pain   Hydrocodone Rash, Itching and Other (See Comments)   Ranolazine Diarrhea   Rosuvastatin Nausea Only and Other (See Comments)    Joint pain  Other reaction(s): Unknown  Joint pain    Joint pain  Other reaction(s): Unknown   Statins Other (See Comments)    Leg pain    Folic Acid     Note: anxiety itching and nausea   Folic Acid-Vit B6-Vit B12 Anxiety, Itching and Nausea Only   Pravastatin Nausea Only and Other (See Comments)    Review of Systems  Musculoskeletal:  Positive for back pain, falls and joint pain.  Neurological:  Positive for dizziness.  Psychiatric/Behavioral:  The patient has insomnia.   All other systems reviewed and are negative.      Objective:   BP 118/62   Pulse (!) 54   Ht 5'  11" (1.803 m)   Wt 204 lb 3.2 oz (92.6 kg)   SpO2 95%   BMI 28.48 kg/m   Vitals:   12/01/22 1034  BP: 118/62  Pulse: (!) 54  Height: 5\' 11"  (1.803 m)  Weight: 204 lb 3.2 oz (92.6 kg)  SpO2: 95%  BMI (Calculated): 28.49    Physical Exam Vitals and nursing note reviewed.  Constitutional:      Appearance: Normal appearance. He is normal weight.  Eyes:     Extraocular Movements: Extraocular movements intact.     Conjunctiva/sclera: Conjunctivae normal.     Pupils: Pupils are equal, round, and  reactive to light.  Cardiovascular:     Rate and Rhythm: Normal rate and regular rhythm.     Pulses: Normal pulses.     Heart sounds: Normal heart sounds.  Pulmonary:     Effort: Pulmonary effort is normal.     Breath sounds: Normal breath sounds.  Abdominal:     General: Abdomen is flat.  Neurological:     General: No focal deficit present.     Mental Status: He is alert and oriented to person, place, and time. Mental status is at baseline.  Psychiatric:        Mood and Affect: Mood normal.        Behavior: Behavior normal.        Thought Content: Thought content normal.        Judgment: Judgment normal.      No results found for any visits on 12/01/22.  Recent Results (from the past 2160 hour(s))  Urine Culture     Status: None   Collection Time: 09/15/22 10:34 AM   Specimen: Urine, Clean Catch  Result Value Ref Range   Specimen Description      URINE, CLEAN CATCH Performed at [facility], [address], [place], [place] [address]    Special Requests      NONE Performed at [facility], [address] [place], [place] [address]    Culture      NO GROWTH Performed at [facility], [address], [place], [place] [address]    Report Status 09/16/2022 FINAL   Urinalysis, Complete w Microscopic -     Status: Abnormal   Collection Time: 09/15/22 10:34 AM  Result Value Ref Range   Color, Urine YELLOW YELLOW   APPearance CLEAR CLEAR    Specific Gravity, Urine 1.015 1.005 - 1.030   pH 5.5 5.0 - 8.0   Glucose, UA NEGATIVE NEGATIVE mg/dL   Hgb urine dipstick NEGATIVE NEGATIVE   Bilirubin Urine NEGATIVE NEGATIVE   Ketones, ur NEGATIVE NEGATIVE mg/dL   Protein, ur NEGATIVE NEGATIVE mg/dL   Nitrite NEGATIVE NEGATIVE   Leukocytes,Ua NEGATIVE NEGATIVE   Squamous Epithelial / HPF 0-5 0 - 5 /HPF   WBC, UA 0-5 0 - 5 WBC/hpf   RBC / HPF 0-5 0 - 5 RBC/hpf   Bacteria, UA FEW (A) NONE SEEN    Comment: Performed at [facility], [address] [place], [place] [address]  Bladder Scan (Post Void Residual) in office     Status: None   Collection Time: 09/15/22 11:12 AM  Result Value Ref Range   Scan Result 45        Assessment & Plan:   Problem List Items Addressed This Visit       Active Problems   Coronary artery disease involving coronary bypass graft of native heart with unstable angina pectoris [facility])    Patient is managed by cardiology, and is seen for follow ups frequently.  Will defer to them for further treatment.   Reassess at follow up      Essential hypertension    Blood pressure well controlled with current medications.  Continue current therapy.  Will reassess at follow up.       Hyperlipidemia    Checking labs today.  Continue current therapy for lipid control. Will modify as needed based on labwork results.       Vitamin B12 deficiency    .bvitam       Peripheral vascular  disease [facility])    Patient well managed.  Swelling in lower extremities is not observed today.  He does use compression socks during the day.   Has not noticed any increase in his swelling as of late.  Will reassess at follow up.       Bilateral exudative age-related macular degeneration [facility])    Patient is followed by ophthalmology, he gets injections regularly.  Will continue to defer to them for treatment.        Other Visit Diagnoses     Screening for malignant neoplasm of colon    -  Primary    Relevant Orders   Cologuard       Return in about 3 months (around 03/03/2023) for F/U.   Total time spent: 30 minutes  Miki Kins, FNP  12/01/2022   This document may have been prepared by [facility] [employer] Voice Recognition software and as such may include unintentional dictation errors.

## 2022-12-01 NOTE — Assessment & Plan Note (Signed)
Patient well managed.  Swelling in lower extremities is not observed today.  He does use compression socks during the day.   Has not noticed any increase in his swelling as of late.  Will reassess at follow up.

## 2022-12-01 NOTE — Assessment & Plan Note (Signed)
Blood pressure well controlled with current medications.  Continue current therapy.  Will reassess at follow up.  

## 2022-12-06 DIAGNOSIS — Z1211 Encounter for screening for malignant neoplasm of colon: Secondary | ICD-10-CM | POA: Diagnosis not present

## 2022-12-08 ENCOUNTER — Other Ambulatory Visit: Payer: Self-pay | Admitting: Family

## 2022-12-08 ENCOUNTER — Ambulatory Visit: Payer: Medicare HMO | Admitting: Cardiovascular Disease

## 2022-12-12 ENCOUNTER — Ambulatory Visit (INDEPENDENT_AMBULATORY_CARE_PROVIDER_SITE_OTHER): Payer: Medicare HMO | Admitting: Cardiovascular Disease

## 2022-12-12 ENCOUNTER — Encounter: Payer: Self-pay | Admitting: Cardiovascular Disease

## 2022-12-12 VITALS — BP 130/60 | HR 52 | Ht 72.0 in | Wt 203.0 lb

## 2022-12-12 DIAGNOSIS — E782 Mixed hyperlipidemia: Secondary | ICD-10-CM | POA: Diagnosis not present

## 2022-12-12 DIAGNOSIS — I739 Peripheral vascular disease, unspecified: Secondary | ICD-10-CM | POA: Diagnosis not present

## 2022-12-12 DIAGNOSIS — I25700 Atherosclerosis of coronary artery bypass graft(s), unspecified, with unstable angina pectoris: Secondary | ICD-10-CM

## 2022-12-12 DIAGNOSIS — I10 Essential (primary) hypertension: Secondary | ICD-10-CM | POA: Diagnosis not present

## 2022-12-12 NOTE — Progress Notes (Signed)
Cardiology Office Note   Date:  12/12/2022   ID:  [name], DOB 07-Jan-1936, MRN [id number]  PCP:  Miki Kins, FNP  Cardiologist:  Adrian Blackwater, MD      History of Present Illness: [name] [name] is a 87 y.o. male who presents for  Chief Complaint  Patient presents with   Follow-up    4 month follow up    Has been falling down a lot and heart rate is 52      Past Medical History:  Diagnosis Date   Arthritis    Benign neoplasm of colon 04/13/2008   Overview:  Benign Polyps Of The Large Intestine - colonoscopy 2006,dr Nance Pear of this note might be different from the original. Formatting of this note might be different from the original. Benign Polyps Of The Large Intestine - colonoscopy 2006,dr Nance Pear of this note might be different from the original. Overview: Benign Polyps Of The Large Intestine - colonoscopy 2006,dr    Blurring of visual image 05/28/2018   Cancer (HCC) 1995   prostate ca    Chest pain 07/27/2016   Chest pain, unspecified 07/27/2016   Coronary artery disease    Esophageal stricture 08/2016   GERD (gastroesophageal reflux disease)    Glaucoma    History of hiatal hernia    Hx of CABG 02/24/2018   Formatting of this note might be different from the original. Two-vessel bypass. Formatting of this note might be different from the original. Formatting of this note might be different from the original. Two-vessel bypass. Formatting of this note might be different from the original. Formatting of this note might be different from the original. Two-vessel bypass.   Hypertension    Malignant neoplasm of prostate (HCC) 04/23/2017   MI (mitral incompetence)    MI, acute, non ST segment elevation (HCC) 10/05/2012   Formatting of this note might be different from the original. Note: Unchanged - x4   Old myocardial infarction 05/01/2019   Note: Unchanged - x4   Orthopedic aftercare 10/08/2017   Peripheral neuropathy     Peripheral vascular disease (HCC)    S/P shoulder replacement, left 09/25/2017   Skin sensation disturbance 06/23/2007   Formatting of this note might be different from the original. Tingling (Paresthesia) Formatting of this note might be different from the original. Formatting of this note might be different from the original. Tingling (Paresthesia)   Tear of meniscus of knee 11/11/2013   Formatting of this note might be different from the original. Formatting of this note might be different from the original. ICD-10 cut over Formatting of this note might be different from the original. Overview: ICD-10 cut over   Vertigo     ACTIVE PROBLEMS & CONDITIONS    Anemia Macrocytic with Vitamin B12 Deficiency    Coronary Artery Disease - History of CABGx2 in 2011 in [place] and multiple stents.    Essential Hypertension    Gerd    History of Acute Myocardial Infarction - x4    Hyperlipidemia - Not on statins due to neruopathy by cardiologist in [place]. Taking PCSK9.    Hyperlipidemia Mixed    Macular Degeneration Exudative Right Eye    Pancreatic Insufficiency Exocrine    Peripheral Neuropathy    Peripheral Vascular Disease of Skin    Sciatica Left-sided    Tinnitus    Vertigo Aural     CHIEF COMPLAINT  The Chief Complaint is: Echo results.     HISTORY  OF PRESENT ILLNESS  [name] [name] is an 87 year old male.   Patient in office to discuss echo results. Denies chest pain, shortness of breath.  Allergy list reviewed   Problem list reviewed   Medication list reviewed    No chest pain or discomfort   No palpitations  , and The heart rate was not fast    Not feeling congested in the chest   No dyspnea   No cough   No wheezing     CURRENT MEDICATION    Acetaminophen 8 Hour 650 MG Oral Tablet Extended Release  2 po bid, 0 days, 0 refills    Adult Aspirin Regimen 81 MG Oral Tablet Delayed Release once a day 0 days, 0 refills    amLODIPine Besylate 5 MG Oral Tablet TAKE ONE TABLET  BY MOUTH EVERYDAY AT BEDTIME, 90 days, 0 refills    Azelastine HCl 0.05% Ophthalmic Solution 1 drop in each eye bid, 30 days, 5 refills    Celecoxib 200 MG Oral Capsule 1 cap by mouth once a day, 30 days, 5 refills    Fenofibrate 145 MG Oral Tablet TAKE ONE TABLET BY MOUTH EVERY EVENING, 90 days, 0 refills    Fluticasone Propionate 50 MCG/ACT Nasal Suspension shake liquid and use 1 to 2 sprays in each nostril daily, 30 days, 5 refills    Latanoprost 0.005% Ophthalmic Solution 1 drop into affected eye every evening for glaucoma, 30 days, 5 refills    Levocetirizine Dihydrochloride 5 MG Oral Tablet 1 tab by mouth every morning, 30 days, 11 refills    Lisinopril 20 MG Oral Tablet TAKE 1 TABLET BY MOUTH TWICE DAILY, 90 days, 1 refills    Meclizine HCl 12.5 MG Oral Tablet 1 tab twice daily as needed, 30 days, 5 refills    Metoprolol Tartrate 25 MG Oral Tablet TAKE 1/2 TABLET BY MOUTH TWICE DAILY, 90 days, 0 refills    Nitrostat 0.4 MG Sublingual Tablet Sublingual Take 1 tablet under tongue every 5 min for up to 3 doses for chest pain as needed., 60 days, 3 refills    Pantoprazole Sodium 40 MG Oral Tablet Delayed Release 1 tab twice a day 30 min before meals, 30 days, 11 refills    Pregabalin 75 MG Oral Capsule take 1 capsule by mouth twice daily., 30 days, 3 refills    Repatha SureClick 140 MG/ML Subcutaneous Solution Auto-injector INJECT 1 PEN SYRINGE IN THE SKIN ONCE EVERY 14 DAYS, 28 days, 2 refills    Restasis 0.05% Ophthalmic Emulsion  1 drop into  both eyes bid, 0 days, 0 refills    Vitamin D3 25 MCG (1000 UT) Oral Tablet  qd, 0 days, 0 refills    Zolpidem Tartrate 10 MG Oral Tablet TAKE 1 TABLET BY MOUTH AT BEDTIME AS NEEDED FOR INSOMNIA, 30 days, 3 refills     PAST MEDICAL/SURGICAL HISTORY  Reported:  No recent change in medical history. Medications: Taking medication. Immunization History: Immunizations reviewed and current. Diagnoses:  Coronary artery disease CABGx2 2011 in [place].  Extensive history of CAD, multiple stents and MI treated in [place] and [place] Acute myocardial infarction x4 Aortic regurgitation Mitral regurgitation Essential hypertension.   Fredrickson type III hyperlipidemia.   Localized primary osteoarthritis.   Nerve injury Sciatica Surgical:   A CABG was done     SOCIAL HISTORY  Social history reviewed by questionnaire. Personal: Life circumstance event recent move. Tobacco use: Not a current smoker.  Tobacco non-user.  Smoking status: Former smoker.  Alcohol: Alcohol use 1 glass wine 2/week and consumption. Habits: Using sunscreen, using a seatbelt, and poor exercise habits. Housing And Economic Circumstances: Lives alone - no help available, the living environment includes includes dogs, and transportation mode car. Home Environment: Home environment lives alone in a rented farmhouse, [place], recently moved in. Education: Education history Doctor, general practice - Energy manager. 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 Functional: Physical disability and activities of daily living pt. states that he has had multiple falls in the past several weeks.     ALLERGIES    Folic Acid    HYDROcodone-Acetaminophen     Reaction: Skin Rashes / Eruption of skin    Isosorbide Mononitrate ER     Reaction: Headache    Pravastatin Sodium     Reaction: Nausea    Ranolazine ER     Reaction: Diarrhea / Diarrheal disorder, Nausea, Vomiting    Statins     FAMILY HISTORY  Family history unchanged  Coronary artery disease     REVIEW OF SYSTEMS  Systemic: Not feeling poorly (malaise).  No recent weight change. Head: No headache and no sinus pain. Cardiovascular: No chest pain or discomfort, no palpitations, and the heart rate was not fast. Pulmonary: No dyspnea, no cough, and no wheezing. Gastrointestinal: No heartburn.  No nausea, no vomiting, no abdominal pain, and no diarrhea. Neurological: No dizziness, no vertigo, and no  fainting. Psychological: No anxiety and no depression.     PHYSICAL FINDINGS    Vitals taken 08/01/2022 10:54 am  BP-Sitting L  120/60 mmHg 100 - 120/60 - 80  Pulse Rate-Sitting  62 bpm 50 - 100  Temp-Oral  97.4 F 96 - 101  Height  71 in 64 - 74  Weight  202 lbs 6.4 oz 120 - 198  Body Mass Index  28.2 kg/m2   Body Surface Area  2.1 m2   Oxygen Saturation  96 % 93 - 100    General Appearance:  In no acute distress. Lungs:  Clear to auscultation. Cardiovascular: Jugular Venous Distention:  JVD not increased.   JVD not increased. Heart Rate And Rhythm:  Normal.   Normal. Heart Sounds:  Normal. Murmurs:  No murmurs were heard. Carotid Arteries:  No bruit in the carotid artery. Arterial Pulses:  Equal bilaterally and normal. Edema:  Not present. Abdomen: Auscultation:  Bowel sounds were normal. Palpation:  Abdominal non-tender. Neurological:  Oriented to time, place, and person.     ASSESSMENT   Coronary artery disease  Aortic regurgitation  Mitral regurgitation  Systemic hypertension  Hyperlipidemia     THERAPY   Continue current medication.  Clinical summary provided to patient.     COUNSELING/EDUCATION   Education and counseling diet and exercise     PLAN   Return to the clinic if condition worsens or new symptoms arise  Follow-up visit Patient doing well. No complaints.    Echo 07/2022  EF 65%  Normal chamber sizes.   Normal left ventricular systolic function.   Mild left ventricular hypertrophy with GRADE 3 ( restrictive physiology) diastolic dysfunction.   Normal right ventricular systolic function.   Normal right ventricular diastolic function.   Normal left ventricular wall motion.   Normal right ventricular wall motion.   Mild tricuspid regurgitation.   Mild pulmonary hypertension.   Normal pulmonary artery pressure.   Mild mitral regurgitation.   Fibrocalcified aortic valve with Mild aortic regurgitation.   No pericardial  effusion.     Patient  needing dental work. May proceed with dental work.     Return in 4 months.     HEALTH REMINDERS    Assess Blood Pressure satisfied 08/01/2022.    Assess BMI satisfied 08/01/2022.    Assess Tobacco Use satisfied 08/01/2022.      Adrian Blackwater MD  Electronically signed by: Adrian Blackwater     Date: 08/01/2022 11:14  Past Surgical History:  Procedure Laterality Date   COLONOSCOPY     CORONARY ANGIOPLASTY WITH STENT PLACEMENT  2005   x9    CORONARY ARTERY BYPASS GRAFT  2011   CORONARY STENT PLACEMENT  07/2014   ESOPHAGOGASTRODUODENOSCOPY     ESOPHAGOGASTRODUODENOSCOPY (EGD) WITH ESOPHAGEAL DILATION  2018   knee scope Bilateral    LEG SURGERY Right 09/2018   LIPOMA EXCISION N/A 09/24/2016   Procedure: EXCISION SUBCUTANEOUS LIPOMA ON UPPER BACK;  Surgeon: Manus Rudd, MD;  Location: [facility] OR;  Service: General;  Laterality: N/A;   Prostectomy  1995   REVERSE SHOULDER ARTHROPLASTY Left 09/25/2017   REVERSE SHOULDER ARTHROPLASTY Left 09/25/2017   Procedure: LEFT REVERSE SHOULDER ARTHROPLASTY; deltoid repair;  Surgeon: Beverely Low, MD;  Location: [facility] OR;  Service: Orthopedics;  Laterality: Left;   ROTATOR CUFF REPAIR Left 2001     Current Outpatient Medications  Medication Sig Dispense Refill   acetaminophen (TYLENOL) 650 MG CR tablet Take 650 mg by mouth as needed for pain. 2 tablets in the morning and 2 tablets at bedtime     amLODipine (NORVASC) 10 MG tablet Take 1 tablet (10 mg total) by mouth daily. 90 tablet 3   aspirin EC 81 MG tablet Take 81 mg by mouth daily.     azelastine (OPTIVAR) 0.05 % ophthalmic solution INSTILL ONE DROP IN EACH EYE twice A DAY 6 mL 5   azithromycin (ZITHROMAX) 250 MG tablet Use as directed 6 tablet 0   calcium carbonate (TUMS - DOSED IN MG ELEMENTAL CALCIUM) 500 MG chewable tablet Chew 2 tablets by mouth daily as needed for indigestion or heartburn.     celecoxib (CELEBREX) 200 MG capsule TAKE ONE CAPSULE BY MOUTH AT NOON 30  capsule 5   cholecalciferol (VITAMIN D) 1000 units tablet Take 1,000 Units by mouth daily.     cycloSPORINE (RESTASIS) 0.05 % ophthalmic emulsion Place 1 drop into both eyes 2 (two) times daily.     Evolocumab (REPATHA SURECLICK) 140 MG/ML SOAJ Inject 140 mg into the skin every 14 (fourteen) days. 2 mL 3   fenofibrate (TRICOR) 145 MG tablet Take 1 tablet (145 mg total) by mouth daily. 90 tablet 3   fluticasone (FLONASE) 50 MCG/ACT nasal spray Place 1-2 sprays into both nostrils as needed.     ibuprofen (ADVIL) 600 MG tablet Take by mouth.     latanoprost (XALATAN) 0.005 % ophthalmic solution Place 1 drop into both eyes at bedtime.  0   levocetirizine (XYZAL) 5 MG tablet      lisinopril (PRINIVIL,ZESTRIL) 20 MG tablet Take 20 mg by mouth 2 (two) times daily.      meclizine (ANTIVERT) 12.5 MG tablet Take 1 tablet by mouth daily.     metoprolol tartrate (LOPRESSOR) 25 MG tablet Take 0.5 tablets (12.5 mg total) by mouth 2 (two) times daily. 180 tablet 0   nitroGLYCERIN (NITROSTAT) 0.4 MG SL tablet Place 1 tablet (0.4 mg total) under the tongue every 5 (five) minutes as needed for chest pain. 30 tablet 3   nystatin-triamcinolone (MYCOLOG II) cream Apply 1 Application topically 2 (two)  times daily.     pantoprazole (PROTONIX) 40 MG tablet 1 tab(s) orally once a day for 90     Polyethyl Glycol-Propyl Glycol (SYSTANE OP) Apply 1 drop to eye 5 (five) times daily as needed (dry eyes).     pregabalin (LYRICA) 75 MG capsule TAKE ONE CAPSULE BY MOUTH TWICE DAILY 60 capsule 3   zolpidem (AMBIEN) 10 MG tablet TAKE ONE TABLET BY MOUTH EVERYDAY AT BEDTIME FOR INSOMNIA 30 tablet 3   Current Facility-Administered Medications  Medication Dose Route Frequency Provider Last Rate Last Admin   cyanocobalamin (VITAMIN B12) injection 1,000 mcg  1,000 mcg Intramuscular Once Miki Kins, FNP        Allergies:   Docosahexaenoic acid (dha), Lovaza [omega-3-acid ethyl esters], Atorvastatin, Hydrocodone, Ranolazine,  Rosuvastatin, Statins, Folic acid, Folic acid-vit Z6-XWR b12, and Pravastatin    Social History:   reports that he has never smoked. He has never been exposed to tobacco smoke. He has never used smokeless tobacco. He reports that he does not currently use alcohol after a past usage of about 7.0 standard drinks of alcohol per week. He reports that he does not use drugs.   Family History:  family history includes Alzheimer's disease in his mother; Cancer in his sister; Diabetes Mellitus II in his sister; Obesity in his sister; Parkinson's disease in his sister; Stroke in his father.    ROS:     Review of Systems  Constitutional: Negative.   HENT: Negative.    Eyes: Negative.   Respiratory: Negative.    Gastrointestinal: Negative.   Genitourinary: Negative.   Musculoskeletal: Negative.   Skin: Negative.   Neurological: Negative.   Endo/Heme/Allergies: Negative.   Psychiatric/Behavioral: Negative.    All other systems reviewed and are negative.     All other systems are reviewed and negative.    PHYSICAL EXAM: VS:  BP 130/60   Pulse (!) 52   Ht 6' (1.829 m)   Wt 203 lb (92.1 kg)   SpO2 98%   BMI 27.53 kg/m  , BMI Body mass index is 27.53 kg/m. Last weight:  Wt Readings from Last 3 Encounters:  12/12/22 203 lb (92.1 kg)  12/01/22 204 lb 3.2 oz (92.6 kg)  09/15/22 201 lb (91.2 kg)     Physical Exam Vitals reviewed.  Constitutional:      Appearance: Normal appearance. He is normal weight.  HENT:     Head: Normocephalic.     Nose: Nose normal.     Mouth/Throat:     Mouth: Mucous membranes are moist.  Eyes:     Pupils: Pupils are equal, round, and reactive to light.  Cardiovascular:     Rate and Rhythm: Normal rate and regular rhythm.     Pulses: Normal pulses.     Heart sounds: Normal heart sounds.  Pulmonary:     Effort: Pulmonary effort is normal.  Abdominal:     General: Abdomen is flat. Bowel sounds are normal.  Musculoskeletal:        General: Normal  range of motion.     Cervical back: Normal range of motion.  Skin:    General: Skin is warm.  Neurological:     General: No focal deficit present.     Mental Status: He is alert.  Psychiatric:        Mood and Affect: Mood normal.       EKG:   Recent Labs: 09/02/2022: ALT 13; BUN 22; Creatinine, Ser 1.34; Hemoglobin 12.4; Platelets 209; Potassium  5.0; Sodium 141; TSH 1.260    Lipid Panel    Component Value Date/Time   CHOL 115 09/02/2022 1200   TRIG 111 09/02/2022 1200   HDL 50 09/02/2022 1200   LDLCALC 45 09/02/2022 1200   LDLDIRECT 62 02/18/2021 0808      Other studies Reviewed: Additional studies/ records that were reviewed today include:  Review of the above records demonstrates:   REASON FOR VISIT  Visit for: Echocardiogram/R06.02  Sex:   Male         wt= 200   lbs.  BP=120/70  Height= 71   inches.        TESTS  Imaging: Echocardiogram:  An echocardiogram in (2-d) mode was performed and in Doppler mode with color flow velocity mapping was performed. The aortic valve cusps are abnormal 2.0  cm, flow velocity .91 m/s, and systolic calculated mean flow gradient 2  mmHg. Mitral valve diastolic peak flow velocity E 8.65  m/s and E/A ratio 1.2. Aortic root diameter 4.8   cm. The LVOT internal diameter 4.0 cm and flow velocity was abnormal 1.06    m/s. LV systolic dimension 2.87   cm, diastolic 4.47  cm, posterior wall thickness 1.27   cm, fractional shortening 35.8 %, and EF 65.5 %. IVS thickness 1.25  cm. LA dimension 5.5 cm. Mitral Valve has Mild Regurgitation. Aortic Valve has Mild Regurgitation. Tricuspid Valve has Mild Regurgitation.     ASSESSMENT  Technically difficult study due to body habitus..  Normal chamber sizes.  Normal left ventricular systolic function.  Mild left ventricular hypertrophy with GRADE 3 ( restrictive physiology) diastolic dysfunction.  Normal right ventricular systolic function.  Normal right ventricular diastolic function.   Normal left ventricular wall motion.  Normal right ventricular wall motion.  Mild tricuspid regurgitation.  Mild pulmonary hypertension.  Normal pulmonary artery pressure.  Mild mitral regurgitation.  Fibrocalcified aortic valve with Mild aortic regurgitation.  No pericardial effusion.  Severely dilated Left atrium   Mildly dialted Right atrium  Moderate LVH.     THERAPY   Referring physician: Laurier Nancy  Sonographer: Adrian Blackwater.      Adrian Blackwater MD  Electronically signed by: Adrian Blackwater     Date: 07/24/2022 14:15 REASON FOR VISIT  Referred by Dr.Kaziah Krizek [place] Flakes.        TESTS  Imaging: Computed Tomographic Angiography:  Cardiac multidetector CT was performed paying particular attention to the coronary arteries for the diagnosis of: Chest Pain. Diagnostic Drugs:  Administered iohexol (Omnipaque) through an antecubital vein and images from the examination were analyzed for the presence and extent of coronary artery disease, using 3D image processing software. 100 mL of non-ionic contrast (Omnipaque) was used.        ASSESSMENT   The LIMA origin was visualized To LAD is patent  The SVG origin was visualized To OM is patent     TEST CONCLUSIONS  Quality of study: Good.  1-Calcium score: 3546.2  2-Right dominant system.  3-Native RCA small mild mid yollo.LIMA to LAD and SVG to OM are patent.      Adrian Blackwater MD  Electronically signed by: Adrian Blackwater     Date: 09/20/2021 11:14     No data to display            ASSESSMENT AND PLAN:    ICD-10-CM   1. Coronary artery disease involving coronary bypass graft of native heart with unstable angina pectoris (HCC)  I25.700 MYOCARDIAL PERFUSION IMAGING   Has occasional  chest pain, advise stress test    2. Essential hypertension  I10 MYOCARDIAL PERFUSION IMAGING    3. Peripheral vascular disease (HCC)  I73.9 MYOCARDIAL PERFUSION IMAGING    4. Mixed hyperlipidemia  E78.2 MYOCARDIAL PERFUSION IMAGING        Problem List Items Addressed This Visit       Cardiovascular and Mediastinum   Coronary artery disease involving coronary bypass graft of native heart with unstable angina pectoris (HCC) - Primary   Relevant Orders   MYOCARDIAL PERFUSION IMAGING   Essential hypertension   Relevant Orders   MYOCARDIAL PERFUSION IMAGING   Peripheral vascular disease (HCC)   Relevant Orders   MYOCARDIAL PERFUSION IMAGING     Other   Hyperlipidemia   Relevant Orders   MYOCARDIAL PERFUSION IMAGING       Disposition:   Return in about 4 weeks (around 01/09/2023) for stress test and f/u.    Total time spent: 30 minutes  Signed,  Adrian Blackwater, MD  12/12/2022 12:04 PM    [facility]

## 2022-12-15 DIAGNOSIS — C44329 Squamous cell carcinoma of skin of other parts of face: Secondary | ICD-10-CM | POA: Diagnosis not present

## 2022-12-15 DIAGNOSIS — D0439 Carcinoma in situ of skin of other parts of face: Secondary | ICD-10-CM | POA: Diagnosis not present

## 2022-12-16 ENCOUNTER — Other Ambulatory Visit: Payer: Self-pay

## 2022-12-16 ENCOUNTER — Other Ambulatory Visit: Payer: Self-pay | Admitting: Family

## 2022-12-16 MED ORDER — PANTOPRAZOLE SODIUM 40 MG PO TBEC: 40.0000 mg | DELAYED_RELEASE_TABLET | Freq: Two times a day (BID) | ORAL | 6 refills | Status: DC

## 2022-12-20 LAB — COLOGUARD: COLOGUARD: POSITIVE — AB

## 2022-12-24 ENCOUNTER — Ambulatory Visit (INDEPENDENT_AMBULATORY_CARE_PROVIDER_SITE_OTHER): Payer: Medicare HMO

## 2022-12-24 DIAGNOSIS — E782 Mixed hyperlipidemia: Secondary | ICD-10-CM

## 2022-12-24 DIAGNOSIS — I10 Essential (primary) hypertension: Secondary | ICD-10-CM

## 2022-12-24 DIAGNOSIS — I25700 Atherosclerosis of coronary artery bypass graft(s), unspecified, with unstable angina pectoris: Secondary | ICD-10-CM | POA: Diagnosis not present

## 2022-12-24 DIAGNOSIS — I739 Peripheral vascular disease, unspecified: Secondary | ICD-10-CM

## 2022-12-24 MED ORDER — TECHNETIUM TC 99M SESTAMIBI GENERIC - CARDIOLITE
34.6000 | Freq: Once | INTRAVENOUS | Status: AC | PRN
Start: 2022-12-24 — End: 2022-12-24
  Administered 2022-12-24: 34.6 via INTRAVENOUS

## 2022-12-24 MED ORDER — TECHNETIUM TC 99M SESTAMIBI GENERIC - CARDIOLITE
9.5000 | Freq: Once | INTRAVENOUS | Status: AC | PRN
  Administered 2022-12-24: 9.5 via INTRAVENOUS

## 2022-12-25 ENCOUNTER — Ambulatory Visit: Payer: Medicare HMO | Admitting: Cardiovascular Disease

## 2022-12-29 ENCOUNTER — Ambulatory Visit (INDEPENDENT_AMBULATORY_CARE_PROVIDER_SITE_OTHER): Payer: Medicare HMO | Admitting: Family

## 2022-12-29 ENCOUNTER — Encounter: Payer: Self-pay | Admitting: Cardiovascular Disease

## 2022-12-29 ENCOUNTER — Encounter: Payer: Self-pay | Admitting: Family

## 2022-12-29 ENCOUNTER — Ambulatory Visit (INDEPENDENT_AMBULATORY_CARE_PROVIDER_SITE_OTHER): Payer: Medicare HMO | Admitting: Cardiovascular Disease

## 2022-12-29 VITALS — BP 130/65 | HR 50 | Ht 72.0 in | Wt 205.6 lb

## 2022-12-29 DIAGNOSIS — E538 Deficiency of other specified B group vitamins: Secondary | ICD-10-CM | POA: Diagnosis not present

## 2022-12-29 DIAGNOSIS — I10 Essential (primary) hypertension: Secondary | ICD-10-CM | POA: Diagnosis not present

## 2022-12-29 DIAGNOSIS — R001 Bradycardia, unspecified: Secondary | ICD-10-CM

## 2022-12-29 DIAGNOSIS — I25728 Atherosclerosis of autologous artery coronary artery bypass graft(s) with other forms of angina pectoris: Secondary | ICD-10-CM | POA: Diagnosis not present

## 2022-12-29 DIAGNOSIS — R9439 Abnormal result of other cardiovascular function study: Secondary | ICD-10-CM | POA: Diagnosis not present

## 2022-12-29 DIAGNOSIS — I8390 Asymptomatic varicose veins of unspecified lower extremity: Secondary | ICD-10-CM

## 2022-12-29 MED ORDER — CYANOCOBALAMIN 1000 MCG/ML IJ SOLN
1000.0000 ug | Freq: Once | INTRAMUSCULAR | Status: AC
Start: 2022-12-29 — End: 2022-12-29
  Administered 2022-12-29: 1000 ug via INTRAMUSCULAR

## 2022-12-29 NOTE — Progress Notes (Signed)
   CHIEF COMPLAINT  B12 Shot     REASON FOR VISIT  B12 Injection     ASSESSMENT  B12 Deficiency, Unspecified     PLAN  Diagnoses and all orders for this visit:  Vitamin B12 deficiency -     cyanocobalamin (VITAMIN B12) injection 1,000 mcg     Pt. given B12 injection in clinic.  Return for next injection per provider instructions.   Total time spent: 10 minutes  Miki Kins, FNP  12/29/2022

## 2022-12-29 NOTE — Progress Notes (Signed)
Cardiology Office Note   Date:  12/29/2022   ID:  [name] [name], DOB [birth year], MRN [id number]  PCP:  Miki Kins, FNP  Cardiologist:  Adrian Blackwater, MD      History of Present Illness: [name] [name] is a 87 y.o. male who presents for  Chief Complaint  Patient presents with   Follow-up    NST Results    Infrequent soreness of chest      Past Medical History:  Diagnosis Date   Arthritis    Benign neoplasm of colon 04/13/2008   Overview:  Benign Polyps Of The Large Intestine - colonoscopy 2006,dr Nance Pear of this note might be different from the original. Formatting of this note might be different from the original. Benign Polyps Of The Large Intestine - colonoscopy 2006,dr Nance Pear of this note might be different from the original. Overview: Benign Polyps Of The Large Intestine - colonoscopy 2006,dr    Blurring of visual image 05/28/2018   Cancer (HCC) 1995   prostate ca    Chest pain 07/27/2016   Chest pain, unspecified 07/27/2016   Coronary artery disease    Esophageal stricture 08/2016   GERD (gastroesophageal reflux disease)    Glaucoma    History of hiatal hernia    Hx of CABG 02/24/2018   Formatting of this note might be different from the original. Two-vessel bypass. Formatting of this note might be different from the original. Formatting of this note might be different from the original. Two-vessel bypass. Formatting of this note might be different from the original. Formatting of this note might be different from the original. Two-vessel bypass.   Hypertension    Malignant neoplasm of prostate (HCC) 04/23/2017   MI (mitral incompetence)    MI, acute, non ST segment elevation (HCC) 10/05/2012   Formatting of this note might be different from the original. Note: Unchanged - x4   Old myocardial infarction 05/01/2019   Note: Unchanged - x4   Orthopedic aftercare 10/08/2017   Peripheral neuropathy    Peripheral vascular disease  (HCC)    S/P shoulder replacement, left 09/25/2017   Skin sensation disturbance 06/23/2007   Formatting of this note might be different from the original. Tingling (Paresthesia) Formatting of this note might be different from the original. Formatting of this note might be different from the original. Tingling (Paresthesia)   Tear of meniscus of knee 11/11/2013   Formatting of this note might be different from the original. Formatting of this note might be different from the original. ICD-10 cut over Formatting of this note might be different from the original. Overview: ICD-10 cut over   Vertigo      Past Surgical History:  Procedure Laterality Date   COLONOSCOPY     CORONARY ANGIOPLASTY WITH STENT PLACEMENT  2005   x9    CORONARY ARTERY BYPASS GRAFT  2011   CORONARY STENT PLACEMENT  07/2014   ESOPHAGOGASTRODUODENOSCOPY     ESOPHAGOGASTRODUODENOSCOPY (EGD) WITH ESOPHAGEAL DILATION  2018   knee scope Bilateral    LEG SURGERY Right 09/2018   LIPOMA EXCISION N/A 09/24/2016   Procedure: EXCISION SUBCUTANEOUS LIPOMA ON UPPER BACK;  Surgeon: Manus Rudd, MD;  Location: [facility] OR;  Service: General;  Laterality: N/A;   Prostectomy  1995   REVERSE SHOULDER ARTHROPLASTY Left 09/25/2017   REVERSE SHOULDER ARTHROPLASTY Left 09/25/2017   Procedure: LEFT REVERSE SHOULDER ARTHROPLASTY; deltoid repair;  Surgeon: Beverely Low, MD;  Location: [facility] OR;  Service: Orthopedics;  Laterality:  Left;   ROTATOR CUFF REPAIR Left 2001     Current Outpatient Medications  Medication Sig Dispense Refill   acetaminophen (TYLENOL) 650 MG CR tablet Take 650 mg by mouth as needed for pain. 2 tablets in the morning and 2 tablets at bedtime     amLODipine (NORVASC) 10 MG tablet Take 1 tablet (10 mg total) by mouth daily. 90 tablet 3   aspirin EC 81 MG tablet Take 81 mg by mouth daily.     azelastine (OPTIVAR) 0.05 % ophthalmic solution INSTILL ONE DROP IN EACH EYE twice A DAY 6 mL 5   azithromycin (ZITHROMAX) 250 MG  tablet Use as directed 6 tablet 0   calcium carbonate (TUMS - DOSED IN MG ELEMENTAL CALCIUM) 500 MG chewable tablet Chew 2 tablets by mouth daily as needed for indigestion or heartburn.     celecoxib (CELEBREX) 200 MG capsule TAKE ONE CAPSULE BY MOUTH AT NOON 30 capsule 5   cholecalciferol (VITAMIN D) 1000 units tablet Take 1,000 Units by mouth daily.     cycloSPORINE (RESTASIS) 0.05 % ophthalmic emulsion Place 1 drop into both eyes 2 (two) times daily.     Evolocumab (REPATHA SURECLICK) 140 MG/ML SOAJ Inject 140 mg into the skin every 14 (fourteen) days. 2 mL 3   fenofibrate (TRICOR) 145 MG tablet Take 1 tablet (145 mg total) by mouth daily. 90 tablet 3   fluticasone (FLONASE) 50 MCG/ACT nasal spray Place 1-2 sprays into both nostrils as needed.     ibuprofen (ADVIL) 600 MG tablet Take by mouth.     latanoprost (XALATAN) 0.005 % ophthalmic solution Place 1 drop into both eyes at bedtime.  0   levocetirizine (XYZAL) 5 MG tablet      lisinopril (PRINIVIL,ZESTRIL) 20 MG tablet Take 20 mg by mouth 2 (two) times daily.      meclizine (ANTIVERT) 12.5 MG tablet Take 1 tablet by mouth daily.     metoprolol tartrate (LOPRESSOR) 25 MG tablet Take 0.5 tablets (12.5 mg total) by mouth 2 (two) times daily. 180 tablet 0   nitroGLYCERIN (NITROSTAT) 0.4 MG SL tablet Place 1 tablet (0.4 mg total) under the tongue every 5 (five) minutes as needed for chest pain. 30 tablet 3   nystatin-triamcinolone (MYCOLOG II) cream APPLY TO AFFECTED AREA(s) TWICE DAILY 60 g 5   pantoprazole (PROTONIX) 40 MG tablet Take 1 tablet (40 mg total) by mouth 2 (two) times daily. 60 tablet 6   Polyethyl Glycol-Propyl Glycol (SYSTANE OP) Apply 1 drop to eye 5 (five) times daily as needed (dry eyes).     pregabalin (LYRICA) 75 MG capsule TAKE ONE CAPSULE BY MOUTH TWICE DAILY 60 capsule 3   zolpidem (AMBIEN) 10 MG tablet TAKE ONE TABLET BY MOUTH EVERYDAY AT BEDTIME FOR INSOMNIA 30 tablet 3   Current Facility-Administered Medications   Medication Dose Route Frequency Provider Last Rate Last Admin   cyanocobalamin (VITAMIN B12) injection 1,000 mcg  1,000 mcg Intramuscular Once Miki Kins, FNP        Allergies:   Docosahexaenoic acid (dha), Lovaza [omega-3-acid ethyl esters], Atorvastatin, Hydrocodone, Ranolazine, Rosuvastatin, Statins, Folic acid, Folic acid-vit Z6-XWR b12, and Pravastatin    Social History:   reports that he has never smoked. He has never been exposed to tobacco smoke. He has never used smokeless tobacco. He reports that he does not currently use alcohol after a past usage of about 7.0 standard drinks of alcohol per week. He reports that he does not use drugs.  Family History:  family history includes Alzheimer's disease in his mother; Cancer in his sister; Diabetes Mellitus II in his sister; Obesity in his sister; Parkinson's disease in his sister; Stroke in his father.    ROS:     Review of Systems  Constitutional: Negative.   HENT: Negative.    Eyes: Negative.   Respiratory: Negative.    Gastrointestinal: Negative.   Genitourinary: Negative.   Musculoskeletal: Negative.   Skin: Negative.   Neurological: Negative.   Endo/Heme/Allergies: Negative.   Psychiatric/Behavioral: Negative.    All other systems reviewed and are negative.     All other systems are reviewed and negative.    PHYSICAL EXAM: VS:  BP 130/65   Pulse (!) 50   Ht 6' (1.829 m)   Wt 205 lb 9.6 oz (93.3 kg)   SpO2 93%   BMI 27.88 kg/m  , BMI Body mass index is 27.88 kg/m. Last weight:  Wt Readings from Last 3 Encounters:  12/29/22 205 lb 9.6 oz (93.3 kg)  12/12/22 203 lb (92.1 kg)  12/01/22 204 lb 3.2 oz (92.6 kg)     Physical Exam Vitals reviewed.  Constitutional:      Appearance: Normal appearance. He is normal weight.  HENT:     Head: Normocephalic.     Nose: Nose normal.     Mouth/Throat:     Mouth: Mucous membranes are moist.  Eyes:     Pupils: Pupils are equal, round, and reactive to light.   Cardiovascular:     Rate and Rhythm: Normal rate and regular rhythm.     Pulses: Normal pulses.     Heart sounds: Normal heart sounds.  Pulmonary:     Effort: Pulmonary effort is normal.  Abdominal:     General: Abdomen is flat. Bowel sounds are normal.  Musculoskeletal:        General: Normal range of motion.     Cervical back: Normal range of motion.  Skin:    General: Skin is warm.  Neurological:     General: No focal deficit present.     Mental Status: He is alert.  Psychiatric:        Mood and Affect: Mood normal.       EKG:   Recent Labs: 09/02/2022: ALT 13; BUN 22; Creatinine, Ser 1.34; Hemoglobin 12.4; Platelets 209; Potassium 5.0; Sodium 141; TSH 1.260    Lipid Panel    Component Value Date/Time   CHOL 115 09/02/2022 1200   TRIG 111 09/02/2022 1200   HDL 50 09/02/2022 1200   LDLCALC 45 09/02/2022 1200   LDLDIRECT 62 02/18/2021 0808      Other studies Reviewed: Additional studies/ records that were reviewed today include:  Review of the above records demonstrates:       No data to display            ASSESSMENT AND PLAN:    ICD-10-CM   1. Asymptomatic varicose veins  I83.90     2. Essential hypertension  I10     3. Coronary artery disease of autologous bypass graft with stable angina pectoris (HCC)  I25.728     4. Primary hypertension  I10     5. Abnormal nuclear stress test  R94.39    Moderate apical wall reversible defect with normal EF. Stable    6. Sinus bradycardia  R00.1    Heart rate 50, doing ok       Problem List Items Addressed This Visit  Cardiovascular and Mediastinum   Asymptomatic varicose veins - Primary   Essential hypertension   Other Visit Diagnoses     Coronary artery disease of autologous bypass graft with stable angina pectoris (HCC)       Primary hypertension       Abnormal nuclear stress test       Moderate apical wall reversible defect with normal EF. Stable   Sinus bradycardia       Heart rate  50, doing ok          Disposition:   No follow-ups on file.    Total time spent: 30 minutes  Signed,  Adrian Blackwater, MD  12/29/2022 10:58 AM    [facility]

## 2022-12-29 NOTE — Assessment & Plan Note (Signed)
Pt. given B12 injection in clinic.  Return for next injection per provider instructions.

## 2023-01-01 ENCOUNTER — Ambulatory Visit: Payer: Medicare HMO

## 2023-01-02 ENCOUNTER — Other Ambulatory Visit: Payer: Self-pay | Admitting: Family

## 2023-01-02 DIAGNOSIS — K639 Disease of intestine, unspecified: Secondary | ICD-10-CM

## 2023-01-05 ENCOUNTER — Ambulatory Visit: Payer: Medicare HMO

## 2023-01-05 NOTE — Chronic Care Management (AMB) (Signed)
Follow Up Pharmacist Visit (CCM)  [name],[name]  87 years, Male  DOB: 1936-06-04  M: [phone]  __________________________________________________ Patient Chart Prep (HC) Chronic Conditions Patient's Chronic Conditions: Cardiovascular Disease (CVD), Hypertension (HTN), Gastroesophageal Reflux Disease (GERD), Hyperlipidemia/Dyslipidemia (HLD), Insomnia, Osteoarthritis Doctor and Hospital Visits Were there PCP Visits since last visit with the Pharmacist?: Yes PCP Visits details: 09/02/22-Shirley-Changed Lyrica to 75mg  BID, Stopped-Baclofen, B12 injection, Zofran, Ranolazine, Bactrim 12/01/22-Shirley-Ordered Cologuard, D/C'd Nystatin-TAC Oint & Zolpidem Were there Specialist Visits since last visit with the Pharmacist?: Yes Specialist Visits details: 08/30/22-ED VISIT-UTI, Started on Bactrim 800-160mg -BIDx10days 09/15/22-Urology-No medication changes 09/17/22-Ortho-Kenalog injection in right knee 10/03/22-Physiatry-Epidural injection L5-S1 10/17/22-Physiatry-F/U, no medication changes 12/12/22-Cardiology (KHAN)-No medication changes 12/29/22-Cardiology (KHAN)-No medication changes Was there a Hospital Visit in last 30 days?: No Were there other Hospital Visits since last visit with the Pharmacist?: No Engagement Notes [name] on 08/26/2022 03:58 PM Rescheduled to 01/05/2023 @ 3pm [name] on 07/31/2022 11:33 AM cp f/u visit 11/20/22 at 10am Pre-Call Questions [name]) Pre-Call Questions Date:: 01/01/2023 Time:: PM Outcome:: Unsuccessful - No answer Engagement Notes [name] on 01/01/2023 03:41 PM pre call: 1 min Disease Assessments Visit Date Visit Completed on: 01/05/2023 Subjective Information Subjective: Worked till he was 10yrs. He is from [name], went to [employer], married and moved to [place]. He has 6 children, 18GC, 12 GGC. He hardly see any of them - scattered all over the country. He doesn't fly due to vertigo. He lives alone and divorced in 63. Lived  in [place] for 69yrs, then [place], back to [place]. Been in the area of about 4 yrs. He lives in a house [address]. He found he had prostate cancer back in 1990s, found when he went for screening. He had heart attack in 2003, couldn't get stents until 2004. Heart surgery open in 2011  Uses [facility].  Rotator cuff surgery 1999 and fell on it 11yrs ago. He is going to get Colonoscopy due to positive cologuard. Lifestyle habits: Diet: Always cooks his own - limited money. He gets food from food bank every 2 weeks. He lives on <$2000 a month and needs help with food. He also has lot of debt - including college loans for children.  2 of his children are alcoholics and are recovery. Alcoholism ran in his mother's family.  Alcohol: none.  He was taking [place] for 7 yrs and decided to get off himself. He takes Melatonin. How many hours per night does patient typically sleep?: 7-8hrs  SDOH: Accountable Health Communities Health-Related Social Needs Screening Tool ([employer]) SDOH questions were documented and reviewed (EMR or [employer]) within the past 12 months or since hospitalization?: Yes Medication Adherence Does the [facility] have access to medication refill history?: No Hypertension (HTN) Most Recent BP: 130/65 Most Recent HR: 50 taken on: 12/29/2022 Care Gap: Need BP documented or last BP 140/90 or higher: Addressed Assessed today?: Yes Goal: <130/80 mmHG Is Patient checking BP at home?: Yes Has patient experienced hypotension, dizziness, falls or bradycardia?: No We discussed: Contacting PCP office for signs and symptoms of high or low blood pressure (hypotension, dizziness, falls, headaches, edema), DASH diet:  following a diet emphasizing fruits and vegetables and low-fat dairy products along with whole grains, fish, poultry, and nuts. Reducing red meats and sugars., Proper Home BP Measurement Assessment::  Controlled Drug: Amlodipine 10mg -QD Pharmacist Assessment: Appropriate, Effective, Safe, Accessible Drug: Lisinopril 20mg -BID Pharmacist Assessment: Appropriate, Effective, Safe, Accessible Drug: Metoprolol Tart 25mg - 1/2 tab BID Pharmacist Assessment: Appropriate, Effective, Safe, Accessible Hyperlipidemia/Dyslipidemia (  HLD) Last Lipid panel on: 09/02/2022 TC (Goal<200): 115 LDL: 45 HDL (Goal>40): 50 TG (Goal<150): 111 ASCVD 10-year risk?is:: N/A due to Age > 46 Assessed today?: Yes LDL Goal: <100 We discussed: How a diet high in fruits/vegetables/nuts/whole grains/beans may help to reduce your cholesterol. Increasing soluble fiber intake.  Avoiding sugary foods and trans fat, limiting carbohydrates, and reducing portion sizes. Recommended increasing intake of healthy fats into their diet Assessment:: Uncontrolled Drug: Fenofibrate 145mg -QD Pharmacist Assessment: Appropriate, Effective, Safe, Accessible Drug: Repatha 140mg -Q14D Pharmacist Assessment: Appropriate, Effective, Safe, Accessible GERD Assessment Assessed today?: No Drug: Pantoprazole DR 40mg -BID Cardiovascular Disease (CVD) Care Gap: Moderate / high intensity statin therapy needed: Patient excluded from population (Age > 75, exclusion code present) Assessed today?: No Drug: Aspirin EC 81mg -QD Drug: Nitroglycerin 0.4mg -As directed Osteoarthritis Assessed today?: No Drug: Celebrex 200mg -At noon Insomnia Assessed today?: No Drug: Zolpidem 10mg -HS Preventative Health Care Gap: Colorectal cancer screening: Patient excluded from population (Age > 68, hx of colorectal cancer or colectomy, hospice services) Care Gap: Breast cancer screening: Patient excluded from population (Age > 57, hx of bilateral mastectomy, frailty, hospice services) Care Gap: Annual Wellness Visit (AWV): Needs to be addressed Engagement Notes [name] on 01/01/2023 11:48 AM chart prep Clinical Summary Summary Next Pharmacist Follow  Up: - Next AWV: to be scheduled Summary for PCP: - Patient is controlled on his chronic conditions and has no concerns todayl. Lynann Bologna, PharmD Televisit and notes 

## 2023-01-12 DIAGNOSIS — H353211 Exudative age-related macular degeneration, right eye, with active choroidal neovascularization: Secondary | ICD-10-CM | POA: Diagnosis not present

## 2023-01-12 DIAGNOSIS — H43813 Vitreous degeneration, bilateral: Secondary | ICD-10-CM | POA: Diagnosis not present

## 2023-01-12 DIAGNOSIS — H353122 Nonexudative age-related macular degeneration, left eye, intermediate dry stage: Secondary | ICD-10-CM | POA: Diagnosis not present

## 2023-01-27 ENCOUNTER — Other Ambulatory Visit: Payer: Self-pay | Admitting: Gastroenterology

## 2023-01-28 ENCOUNTER — Encounter: Payer: Self-pay | Admitting: Cardiology

## 2023-01-29 ENCOUNTER — Ambulatory Visit: Payer: Medicare HMO

## 2023-02-02 ENCOUNTER — Ambulatory Visit (INDEPENDENT_AMBULATORY_CARE_PROVIDER_SITE_OTHER): Payer: Medicare HMO | Admitting: Family

## 2023-02-02 DIAGNOSIS — E538 Deficiency of other specified B group vitamins: Secondary | ICD-10-CM | POA: Diagnosis not present

## 2023-02-02 MED ORDER — CYANOCOBALAMIN 1000 MCG/ML IJ SOLN
1000.0000 ug | Freq: Once | INTRAMUSCULAR | Status: AC
Start: 2023-02-02 — End: 2023-02-02
  Administered 2023-02-02: 1000 ug via INTRAMUSCULAR

## 2023-02-02 NOTE — Progress Notes (Signed)
   CHIEF COMPLAINT  B12 Shot     REASON FOR VISIT  B12 Injection     ASSESSMENT  B12 Deficiency, Unspecified     PLAN  Diagnoses and all orders for this visit:  Vitamin B12 deficiency -     cyanocobalamin (VITAMIN B12) injection 1,000 mcg     Pt. given B12 injection in clinic.  Return for next injection per provider instructions.   Total time spent: 5 minutes  Miki Kins, FNP  02/02/2023

## 2023-02-06 ENCOUNTER — Other Ambulatory Visit: Payer: Self-pay

## 2023-02-06 DIAGNOSIS — R3989 Other symptoms and signs involving the genitourinary system: Secondary | ICD-10-CM

## 2023-02-06 NOTE — Progress Notes (Unsigned)
02/09/23 4:20 PM   [name] [name] May 11, 1936 [id number]  Referring provider:  Miki Kins, FNP [address] [place],  [place] [address]  Urological history Prostate cancer  -PSA (11/2021) 0.01 - s/p perineal prostatectomy in 1995  2. Urinary incontinence, male stress incontinence - Referred to physical therapy  - Managed with incontinence clamp on active days/condom catheters   HPI: [name] [name] is a 87 y.o.male who presents today for odor and frequency.    Previous records reviewed.   Last week for about 3 to 4 days he had a foul list smelling urine.  He also has bilateral groin pain.  All these symptoms have not abated.  Patient denies any modifying or aggravating factors.  Patient denies any recent UTI's, gross hematuria, dysuria or suprapubic/flank pain.  Patient denies any fevers, chills, nausea or vomiting.    He has been doing pelvic floor exercises at home noticing a big improvement in his incontinence.  UA yellow clear, specific gravity 1.020, pH 5.5, microscopy negative  PVR 1 mL   PMH: Past Medical History:  Diagnosis Date   Arthritis    Benign neoplasm of colon 04/13/2008   Overview:  Benign Polyps Of The Large Intestine - colonoscopy 2006,dr Nance Pear of this note might be different from the original. Formatting of this note might be different from the original. Benign Polyps Of The Large Intestine - colonoscopy 2006,dr Nance Pear of this note might be different from the original. Overview: Benign Polyps Of The Large Intestine - colonoscopy 2006,dr    Blurring of visual image 05/28/2018   Cancer (HCC) 1995   prostate ca    Chest pain 07/27/2016   Chest pain, unspecified 07/27/2016   Coronary artery disease    Esophageal stricture 08/2016   GERD (gastroesophageal reflux disease)    Glaucoma    History of hiatal hernia    Hx of CABG 02/24/2018   Formatting of this note might be different from the original. Two-vessel  bypass. Formatting of this note might be different from the original. Formatting of this note might be different from the original. Two-vessel bypass. Formatting of this note might be different from the original. Formatting of this note might be different from the original. Two-vessel bypass.   Hypertension    Malignant neoplasm of prostate (HCC) 04/23/2017   MI (mitral incompetence)    MI, acute, non ST segment elevation (HCC) 10/05/2012   Formatting of this note might be different from the original. Note: Unchanged - x4   Old myocardial infarction 05/01/2019   Note: Unchanged - x4   Orthopedic aftercare 10/08/2017   Peripheral neuropathy    Peripheral vascular disease (HCC)    S/P shoulder replacement, left 09/25/2017   Skin sensation disturbance 06/23/2007   Formatting of this note might be different from the original. Tingling (Paresthesia) Formatting of this note might be different from the original. Formatting of this note might be different from the original. Tingling (Paresthesia)   Tear of meniscus of knee 11/11/2013   Formatting of this note might be different from the original. Formatting of this note might be different from the original. ICD-10 cut over Formatting of this note might be different from the original. Overview: ICD-10 cut over   Vertigo     Surgical History: Past Surgical History:  Procedure Laterality Date   COLONOSCOPY     CORONARY ANGIOPLASTY WITH STENT PLACEMENT  2005   x9    CORONARY ARTERY BYPASS GRAFT  2011   CORONARY  STENT PLACEMENT  07/2014   ESOPHAGOGASTRODUODENOSCOPY     ESOPHAGOGASTRODUODENOSCOPY (EGD) WITH ESOPHAGEAL DILATION  2018   knee scope Bilateral    LEG SURGERY Right 09/2018   LIPOMA EXCISION N/A 09/24/2016   Procedure: EXCISION SUBCUTANEOUS LIPOMA ON UPPER BACK;  Surgeon: Manus Rudd, MD;  Location: [facility] OR;  Service: General;  Laterality: N/A;   Prostectomy  1995   REVERSE SHOULDER ARTHROPLASTY Left 09/25/2017   REVERSE SHOULDER  ARTHROPLASTY Left 09/25/2017   Procedure: LEFT REVERSE SHOULDER ARTHROPLASTY; deltoid repair;  Surgeon: Beverely Low, MD;  Location: [facility];  Service: Orthopedics;  Laterality: Left;   ROTATOR CUFF REPAIR Left 2001    Home Medications:  Allergies as of 02/09/2023       Reactions   Docosahexaenoic Acid (dha) Diarrhea   Lovaza [omega-3-acid Ethyl Esters] Diarrhea   Atorvastatin Other (See Comments), Nausea Only   Joint pain   Hydrocodone Rash, Itching, Other (See Comments)   Ranolazine Diarrhea   Rosuvastatin Nausea Only, Other (See Comments)   Joint pain Other reaction(s): Unknown Joint pain    Joint pain  Other reaction(s): Unknown   Statins Other (See Comments)   Leg pain    Folic Acid    Note: anxiety itching and nausea   Folic Acid-vit B6-vit B12 Anxiety, Itching, Nausea Only   Pravastatin Nausea Only, Other (See Comments)        Medication List        Accurate as of February 09, 2023  4:20 PM. If you have any questions, ask your nurse or doctor.          STOP taking these medications    azithromycin 250 MG tablet Commonly known as: ZITHROMAX Stopped by: Ghislaine Harcum       TAKE these medications    acetaminophen 650 MG CR tablet Commonly known as: TYLENOL Take 650 mg by mouth as needed for pain. 2 tablets in the morning and 2 tablets at bedtime   amLODipine 10 MG tablet Commonly known as: NORVASC Take 1 tablet (10 mg total) by mouth daily.   aspirin EC 81 MG tablet Take 81 mg by mouth daily.   azelastine 0.05 % ophthalmic solution Commonly known as: OPTIVAR INSTILL ONE DROP IN [facility] EYE twice A DAY   calcium carbonate 500 MG chewable tablet Commonly known as: TUMS - dosed in mg elemental calcium Chew 2 tablets by mouth daily as needed for indigestion or heartburn.   celecoxib 200 MG capsule Commonly known as: CELEBREX TAKE ONE CAPSULE BY MOUTH AT NOON   cholecalciferol 1000 units tablet Commonly known as: VITAMIN D Take 1,000 Units by mouth  daily.   cycloSPORINE 0.05 % ophthalmic emulsion Commonly known as: RESTASIS Place 1 drop into both eyes 2 (two) times daily.   fenofibrate 145 MG tablet Commonly known as: Tricor Take 1 tablet (145 mg total) by mouth daily.   fluticasone 50 MCG/ACT nasal spray Commonly known as: FLONASE Place 1-2 sprays into both nostrils as needed.   ibuprofen 600 MG tablet Commonly known as: ADVIL Take by mouth.   latanoprost 0.005 % ophthalmic solution Commonly known as: XALATAN Place 1 drop into both eyes at bedtime.   levocetirizine 5 MG tablet Commonly known as: XYZAL   lisinopril 20 MG tablet Commonly known as: ZESTRIL Take 20 mg by mouth 2 (two) times daily.   meclizine 12.5 MG tablet Commonly known as: ANTIVERT Take 1 tablet by mouth daily.   metoprolol tartrate 25 MG tablet Commonly known as: LOPRESSOR Take  0.5 tablets (12.5 mg total) by mouth 2 (two) times daily.   nitroGLYCERIN 0.4 MG SL tablet Commonly known as: NITROSTAT Place 1 tablet (0.4 mg total) under the tongue every 5 (five) minutes as needed for chest pain.   nystatin-triamcinolone cream Commonly known as: MYCOLOG II APPLY TO AFFECTED AREA(s) TWICE DAILY   pantoprazole 40 MG tablet Commonly known as: PROTONIX Take 1 tablet (40 mg total) by mouth 2 (two) times daily.   pregabalin 75 MG capsule Commonly known as: LYRICA TAKE ONE CAPSULE BY MOUTH TWICE DAILY   Repatha SureClick 140 MG/ML Soaj Generic drug: Evolocumab Inject 140 mg into the skin every 14 (fourteen) days.   SYSTANE OP Apply 1 drop to eye 5 (five) times daily as needed (dry eyes).   zolpidem 10 MG tablet Commonly known as: AMBIEN TAKE ONE TABLET BY MOUTH EVERYDAY AT BEDTIME FOR INSOMNIA        Allergies:  Allergies  Allergen Reactions   Docosahexaenoic Acid (Dha) Diarrhea   Lovaza [Omega-3-Acid Ethyl Esters] Diarrhea   Atorvastatin Other (See Comments) and Nausea Only    Joint pain   Hydrocodone Rash, Itching and Other (See  Comments)   Ranolazine Diarrhea   Rosuvastatin Nausea Only and Other (See Comments)    Joint pain  Other reaction(s): Unknown  Joint pain    Joint pain  Other reaction(s): Unknown   Statins Other (See Comments)    Leg pain    Folic Acid     Note: anxiety itching and nausea   Folic Acid-Vit B6-Vit B12 Anxiety, Itching and Nausea Only   Pravastatin Nausea Only and Other (See Comments)    Family History: Family History  Problem Relation Age of Onset   Alzheimer's disease Mother    Stroke Father    Parkinson's disease Sister    Cancer Sister    Obesity Sister    Diabetes Mellitus II Sister     Social History:  reports that he has never smoked. He has never been exposed to tobacco smoke. He has never used smokeless tobacco. He reports that he does not currently use alcohol after a past usage of about 7.0 standard drinks of alcohol per week. He reports that he does not use drugs.   Physical Exam: BP (!) 138/56 (BP Location: Left Arm, Patient Position: Sitting, Cuff Size: Normal)   Pulse (!) 56   Ht 6' (1.829 m)   Wt 207 lb 3.2 oz (94 kg)   BMI 28.10 kg/m   Constitutional:  Well nourished. Alert and oriented, No acute distress. HEENT: [place] AT, moist mucus membranes.  Trachea midline Cardiovascular: No clubbing, cyanosis, or edema. Respiratory: Normal respiratory effort, no increased work of breathing. Neurologic: Grossly intact, no focal deficits, moving all 4 extremities. Psychiatric: Normal mood and affect.   Laboratory Data: Urinalysis Component     Latest Ref Rng 02/09/2023  Color, Urine     YELLOW  YELLOW   Appearance     CLEAR  CLEAR   Specific Gravity, Urine     1.005 - 1.030  1.020   pH     5.0 - 8.0  5.5   Glucose, UA     NEGATIVE mg/dL NEGATIVE   Hgb urine dipstick     NEGATIVE  NEGATIVE   Bilirubin Urine     NEGATIVE  NEGATIVE   Ketones, ur     NEGATIVE mg/dL NEGATIVE   Protein     NEGATIVE mg/dL NEGATIVE   Nitrite     NEGATIVE  NEGATIVE    Leukocytes,Ua     NEGATIVE  NEGATIVE   Squamous Epithelial / HPF     0 - 5 /HPF NONE SEEN   WBC, UA     0 - 5 WBC/hpf NONE SEEN   RBC / HPF     0 - 5 RBC/hpf NONE SEEN   Bacteria, UA     NONE SEEN  NONE SEEN       I have reviewed the labs.   Pertinent Imaging  02/09/23 15:23  Scan Result 1ml    Assessment & Plan:    1. Foul smelling urine -UA clear -urine sent for culture -Explained that this may be a self-limiting infection as the symptoms lasted for a short time and now he is asymptomatic or the foul-smelling urine may be the result of something he ate or hydration status  2. Urinary incontinence  -Managed with incontinent pads and pelvic floor exercises   Return if symptoms worsen or fail to improve.  [facility] [address], [address] [place], [place] [address] [phone]

## 2023-02-09 ENCOUNTER — Encounter: Payer: Self-pay | Admitting: Urology

## 2023-02-09 ENCOUNTER — Ambulatory Visit (INDEPENDENT_AMBULATORY_CARE_PROVIDER_SITE_OTHER): Payer: Medicare HMO | Admitting: Urology

## 2023-02-09 ENCOUNTER — Other Ambulatory Visit
Admission: RE | Admit: 2023-02-09 | Discharge: 2023-02-09 | Disposition: A | Discharge status: Home / Self Care | Payer: Medicare HMO | Attending: Urology | Admitting: Urology

## 2023-02-09 VITALS — BP 138/56 | HR 56 | Ht 72.0 in | Wt 207.2 lb

## 2023-02-09 DIAGNOSIS — R3989 Other symptoms and signs involving the genitourinary system: Secondary | ICD-10-CM | POA: Insufficient documentation

## 2023-02-09 DIAGNOSIS — N393 Stress incontinence (female) (male): Secondary | ICD-10-CM | POA: Diagnosis not present

## 2023-02-09 DIAGNOSIS — R8290 Unspecified abnormal findings in urine: Secondary | ICD-10-CM

## 2023-02-09 DIAGNOSIS — R350 Frequency of micturition: Secondary | ICD-10-CM

## 2023-02-09 DIAGNOSIS — R82998 Other abnormal findings in urine: Secondary | ICD-10-CM | POA: Diagnosis not present

## 2023-02-09 LAB — URINALYSIS, COMPLETE (UACMP) WITH MICROSCOPIC
Bacteria, UA: NONE SEEN
Bilirubin Urine: NEGATIVE
Glucose, UA: NEGATIVE mg/dL
Hgb urine dipstick: NEGATIVE
Ketones, ur: NEGATIVE mg/dL
Leukocytes,Ua: NEGATIVE
Nitrite: NEGATIVE
Protein, ur: NEGATIVE mg/dL
RBC / HPF: NONE SEEN RBC/hpf (ref 0–5)
Specific Gravity, Urine: 1.02 (ref 1.005–1.030)
Squamous Epithelial / HPF: NONE SEEN /HPF (ref 0–5)
WBC, UA: NONE SEEN WBC/hpf (ref 0–5)
pH: 5.5 (ref 5.0–8.0)

## 2023-02-09 LAB — BLADDER SCAN AMB NON-IMAGING

## 2023-02-17 ENCOUNTER — Other Ambulatory Visit: Payer: Self-pay | Admitting: Cardiovascular Disease

## 2023-03-02 ENCOUNTER — Ambulatory Visit: Payer: Medicare HMO | Admitting: Cardiovascular Disease

## 2023-03-03 ENCOUNTER — Ambulatory Visit: Payer: Medicare HMO | Admitting: Family

## 2023-03-05 DIAGNOSIS — H353122 Nonexudative age-related macular degeneration, left eye, intermediate dry stage: Secondary | ICD-10-CM | POA: Diagnosis not present

## 2023-03-05 DIAGNOSIS — H353211 Exudative age-related macular degeneration, right eye, with active choroidal neovascularization: Secondary | ICD-10-CM | POA: Diagnosis not present

## 2023-03-05 DIAGNOSIS — H43813 Vitreous degeneration, bilateral: Secondary | ICD-10-CM | POA: Diagnosis not present

## 2023-03-05 DIAGNOSIS — H2513 Age-related nuclear cataract, bilateral: Secondary | ICD-10-CM | POA: Diagnosis not present

## 2023-03-07 ENCOUNTER — Other Ambulatory Visit: Payer: Self-pay | Admitting: Cardiovascular Disease

## 2023-03-10 ENCOUNTER — Ambulatory Visit (INDEPENDENT_AMBULATORY_CARE_PROVIDER_SITE_OTHER): Payer: Medicare HMO | Admitting: Family

## 2023-03-10 VITALS — BP 140/65 | HR 64 | Ht 72.0 in | Wt 208.8 lb

## 2023-03-10 DIAGNOSIS — E782 Mixed hyperlipidemia: Secondary | ICD-10-CM | POA: Diagnosis not present

## 2023-03-10 DIAGNOSIS — H353230 Exudative age-related macular degeneration, bilateral, stage unspecified: Secondary | ICD-10-CM | POA: Diagnosis not present

## 2023-03-10 DIAGNOSIS — W19XXXS Unspecified fall, sequela: Secondary | ICD-10-CM | POA: Diagnosis not present

## 2023-03-10 DIAGNOSIS — E559 Vitamin D deficiency, unspecified: Secondary | ICD-10-CM | POA: Diagnosis not present

## 2023-03-10 DIAGNOSIS — I10 Essential (primary) hypertension: Secondary | ICD-10-CM

## 2023-03-10 DIAGNOSIS — E538 Deficiency of other specified B group vitamins: Secondary | ICD-10-CM | POA: Diagnosis not present

## 2023-03-10 NOTE — Progress Notes (Signed)
Established Patient Office Visit  Subjective:  Patient ID: [name] [name], male    DOB: 1936-02-13  Age: 87 y.o. MRN: [id number]  Chief Complaint  Patient presents with   Follow-up    3 MO F/U & B12    Patient is here today for his 3 month follow up.  He has a few concerns to discuss today.   Has fallen almost 50 times in the last few years.  Fell twice on Sunday, twice on Monday and once today. Physical therapy for strengthening.  Has worsened rapidly in the past 6 months or so.  He has a list of all the times he has fallen, which have rapidly increased.  Asks about possible options to help with his strength, cannot afford to go to physical therapy every day.   Vertigo has resolved for the most part. Says he takes a meclizine and it will go away.    He asks if it should be okay for him to fly.     No other concerns at this time.   Past Medical History:  Diagnosis Date   Arthritis    Benign neoplasm of colon 04/13/2008   Overview:  Benign Polyps Of The Large Intestine - colonoscopy 2006,dr Nance Pear of this note might be different from the original. Formatting of this note might be different from the original. Benign Polyps Of The Large Intestine - colonoscopy 2006,dr Nance Pear of this note might be different from the original. Overview: Benign Polyps Of The Large Intestine - colonoscopy 2006,dr    Blurring of visual image 05/28/2018   Cancer (HCC) 1995   prostate ca    Chest pain 07/27/2016   Chest pain, unspecified 07/27/2016   Coronary artery disease    Esophageal stricture 08/2016   GERD (gastroesophageal reflux disease)    Glaucoma    History of hiatal hernia    Hx of CABG 02/24/2018   Formatting of this note might be different from the original. Two-vessel bypass. Formatting of this note might be different from the original. Formatting of this note might be different from the original. Two-vessel bypass. Formatting of this note might be  different from the original. Formatting of this note might be different from the original. Two-vessel bypass.   Hypertension    Malignant neoplasm of prostate (HCC) 04/23/2017   MI (mitral incompetence)    MI, acute, non ST segment elevation (HCC) 10/05/2012   Formatting of this note might be different from the original. Note: Unchanged - x4   Old myocardial infarction 05/01/2019   Note: Unchanged - x4   Orthopedic aftercare 10/08/2017   Peripheral neuropathy    Peripheral vascular disease (HCC)    S/P shoulder replacement, left 09/25/2017   Skin sensation disturbance 06/23/2007   Formatting of this note might be different from the original. Tingling (Paresthesia) Formatting of this note might be different from the original. Formatting of this note might be different from the original. Tingling (Paresthesia)   Tear of meniscus of knee 11/11/2013   Formatting of this note might be different from the original. Formatting of this note might be different from the original. ICD-10 cut over Formatting of this note might be different from the original. Overview: ICD-10 cut over   Vertigo     Past Surgical History:  Procedure Laterality Date   COLONOSCOPY     CORONARY ANGIOPLASTY WITH STENT PLACEMENT  2005   x9    CORONARY ARTERY BYPASS GRAFT  2011   CORONARY STENT  PLACEMENT  07/2014   ESOPHAGOGASTRODUODENOSCOPY     ESOPHAGOGASTRODUODENOSCOPY (EGD) WITH ESOPHAGEAL DILATION  2018   knee scope Bilateral    LEG SURGERY Right 09/2018   LIPOMA EXCISION N/A 09/24/2016   Procedure: EXCISION SUBCUTANEOUS LIPOMA ON UPPER BACK;  Surgeon: Manus Rudd, MD;  Location: [facility] OR;  Service: General;  Laterality: N/A;   Prostectomy  1995   REVERSE SHOULDER ARTHROPLASTY Left 09/25/2017   REVERSE SHOULDER ARTHROPLASTY Left 09/25/2017   Procedure: LEFT REVERSE SHOULDER ARTHROPLASTY; deltoid repair;  Surgeon: Beverely Low, MD;  Location: [facility] OR;  Service: Orthopedics;  Laterality: Left;   ROTATOR CUFF REPAIR  Left 2001    Social History   Socioeconomic History   Marital status: Divorced    Spouse name: Not on file   Number of children: 6   Years of education: Not on file   Highest education level: Not on file  Occupational History   Not on file  Tobacco Use   Smoking status: Never    Passive exposure: Never   Smokeless tobacco: Never  Vaping Use   Vaping status: Never Used  Substance and Sexual Activity   Alcohol use: Not Currently    Alcohol/week: 7.0 standard drinks of alcohol    Types: 7 Glasses of wine per week    Comment: wine with dinner   Drug use: No   Sexual activity: Not Currently  Other Topics Concern   Not on file  Social History Narrative   Not on file   Social Determinants of Health   Financial Resource Strain: Not on file  Food Insecurity: Not on file  Transportation Needs: Not on file  Physical Activity: Not on file  Stress: Not on file  Social Connections: Not on file  Intimate Partner Violence: Not on file    Family History  Problem Relation Age of Onset   Alzheimer's disease Mother    Stroke Father    Parkinson's disease Sister    Cancer Sister    Obesity Sister    Diabetes Mellitus II Sister     Allergies  Allergen Reactions   Docosahexaenoic Acid (Dha) Diarrhea   Lovaza [Omega-3-Acid Ethyl Esters] Diarrhea   Atorvastatin Other (See Comments) and Nausea Only    Joint pain   Hydrocodone Rash, Itching and Other (See Comments)   Ranolazine Diarrhea   Rosuvastatin Nausea Only and Other (See Comments)    Joint pain  Other reaction(s): Unknown  Joint pain    Joint pain  Other reaction(s): Unknown   Statins Other (See Comments)    Leg pain    Folic Acid     Note: anxiety itching and nausea   Folic Acid-Vit B6-Vit B12 Anxiety, Itching and Nausea Only   Pravastatin Nausea Only and Other (See Comments)    Review of Systems  All other systems reviewed and are negative.      Objective:   BP (!) 140/65   Pulse 64   Ht 6' (1.829  m)   Wt 208 lb 12.8 oz (94.7 kg)   SpO2 94%   BMI 28.32 kg/m   Vitals:   03/10/23 1411  BP: (!) 140/65  Pulse: 64  Height: 6' (1.829 m)  Weight: 208 lb 12.8 oz (94.7 kg)  SpO2: 94%  BMI (Calculated): 28.31    Physical Exam Vitals and nursing note reviewed.  Constitutional:      General: He is not in acute distress.    Appearance: Normal appearance. He is normal weight. He is not ill-appearing.  Eyes:     Extraocular Movements: Extraocular movements intact.     Conjunctiva/sclera: Conjunctivae normal.     Pupils: Pupils are equal, round, and reactive to light.  Cardiovascular:     Rate and Rhythm: Normal rate and regular rhythm.     Pulses: Normal pulses.     Heart sounds: Normal heart sounds.  Pulmonary:     Effort: Pulmonary effort is normal.     Breath sounds: Normal breath sounds.  Neurological:     General: No focal deficit present.     Mental Status: He is alert and oriented to person, place, and time. Mental status is at baseline.     Motor: Weakness present.  Psychiatric:        Mood and Affect: Mood normal.        Behavior: Behavior normal.        Thought Content: Thought content normal.        Judgment: Judgment normal.      No results found for any visits on 03/10/23.  Recent Results (from the past 2160 hour(s))  Urine Culture     Status: None   Collection Time: 02/09/23  2:59 PM   Specimen: Urine, Clean Catch  Result Value Ref Range   Specimen Description      URINE, CLEAN CATCH Performed at [facility], [address] [place], [place] [address]    Special Requests      NONE Performed at [facility], [address] [place], [place] [address]    Culture      NO GROWTH Performed at [facility], [address], [place], [place] [address]    Report Status 02/10/2023 FINAL   Urinalysis, Complete w Microscopic -     Status: None   Collection Time: 02/09/23  2:59 PM  Result Value Ref Range   Color, Urine  YELLOW YELLOW   APPearance CLEAR CLEAR   Specific Gravity, Urine 1.020 1.005 - 1.030   pH 5.5 5.0 - 8.0   Glucose, UA NEGATIVE NEGATIVE mg/dL   Hgb urine dipstick NEGATIVE NEGATIVE   Bilirubin Urine NEGATIVE NEGATIVE   Ketones, ur NEGATIVE NEGATIVE mg/dL   Protein, ur NEGATIVE NEGATIVE mg/dL   Nitrite NEGATIVE NEGATIVE   Leukocytes,Ua NEGATIVE NEGATIVE   Squamous Epithelial / HPF NONE SEEN 0 - 5 /HPF   WBC, UA NONE SEEN 0 - 5 WBC/hpf   RBC / HPF NONE SEEN 0 - 5 RBC/hpf   Bacteria, UA NONE SEEN NONE SEEN    Comment: Performed at [facility], [address] [place], [place] [address]  Bladder Scan (Post Void Residual) in office     Status: None   Collection Time: 02/09/23  3:23 PM  Result Value Ref Range   Scan Result 1ml        Assessment & Plan:   Problem List Items Addressed This Visit       Active Problems   Essential hypertension    Blood pressure well controlled with current medications.  Continue current therapy.  Will reassess at follow up.       Hyperlipidemia    Patient stable.  Well controlled with current therapy.   Continue current meds.       Vitamin D deficiency    Checking labs today.  Will continue supplements as needed.       Vitamin B12 deficiency    Checking labs today.  Will continue supplements as needed.  Bilateral exudative age-related macular degeneration [facility]) - Primary    Patient is seen by ophthalmology, who manage this condition.  He is well controlled with current therapy.   Will defer to them for further changes to plan of care.       Other Visit Diagnoses     Fall, sequela       Will see if I can get him set up for home health, esp home health PT, as I think he needs this to improve his strength and  exercise tolerance.   Relevant Orders   Ambulatory referral to Home Health       Return in about 3 months (around 06/10/2023).   Total time spent: 30 minutes  Miki Kins,  FNP  03/10/2023   This document may have been prepared by [facility] Voice Recognition software and as such may include unintentional dictation errors.

## 2023-03-11 ENCOUNTER — Ambulatory Visit: Payer: Medicare HMO | Admitting: Cardiology

## 2023-03-18 ENCOUNTER — Ambulatory Visit: Payer: Medicare HMO | Admitting: Cardiology

## 2023-03-18 ENCOUNTER — Encounter: Payer: Self-pay | Admitting: Cardiology

## 2023-03-18 VITALS — BP 126/56 | HR 53 | Ht 72.0 in | Wt 206.6 lb

## 2023-03-18 DIAGNOSIS — I25700 Atherosclerosis of coronary artery bypass graft(s), unspecified, with unstable angina pectoris: Secondary | ICD-10-CM

## 2023-03-18 DIAGNOSIS — E782 Mixed hyperlipidemia: Secondary | ICD-10-CM

## 2023-03-18 DIAGNOSIS — I10 Essential (primary) hypertension: Secondary | ICD-10-CM

## 2023-03-18 NOTE — Assessment & Plan Note (Signed)
Patient doing well. No complaints today. No further chest pain. Denies shortness of breath.

## 2023-03-18 NOTE — Progress Notes (Signed)
Cardiology Office Note   Date:  03/18/2023   ID:  [name] [name], DOB Jul 12, 1936, MRN [id number]  PCP:  Miki Kins, FNP  Cardiologist:  Marisue Ivan, NP      History of Present Illness: [name] [name] is a 87 y.o. male who presents for  Chief Complaint  Patient presents with   Follow-up    2 month follow up    Patient in office for routine cardiac exam. Denies chest pain, shortness of breath, dizziness, lower extremity edema.       Past Medical History:  Diagnosis Date   Arthritis    Benign neoplasm of colon 04/13/2008   Overview:  Benign Polyps Of The Large Intestine - colonoscopy 2006,dr Nance Pear of this note might be different from the original. Formatting of this note might be different from the original. Benign Polyps Of The Large Intestine - colonoscopy 2006,dr Nance Pear of this note might be different from the original. Overview: Benign Polyps Of The Large Intestine - colonoscopy 2006,dr    Blurring of visual image 05/28/2018   Cancer (HCC) 1995   prostate ca    Chest pain 07/27/2016   Chest pain, unspecified 07/27/2016   Coronary artery disease    Esophageal stricture 08/2016   GERD (gastroesophageal reflux disease)    Glaucoma    History of hiatal hernia    Hx of CABG 02/24/2018   Formatting of this note might be different from the original. Two-vessel bypass. Formatting of this note might be different from the original. Formatting of this note might be different from the original. Two-vessel bypass. Formatting of this note might be different from the original. Formatting of this note might be different from the original. Two-vessel bypass.   Hypertension    Malignant neoplasm of prostate (HCC) 04/23/2017   MI (mitral incompetence)    MI, acute, non ST segment elevation (HCC) 10/05/2012   Formatting of this note might be different from the original. Note: Unchanged - x4   Old myocardial infarction 05/01/2019   Note: Unchanged -  x4   Orthopedic aftercare 10/08/2017   Peripheral neuropathy    Peripheral vascular disease (HCC)    S/P shoulder replacement, left 09/25/2017   Skin sensation disturbance 06/23/2007   Formatting of this note might be different from the original. Tingling (Paresthesia) Formatting of this note might be different from the original. Formatting of this note might be different from the original. Tingling (Paresthesia)   Tear of meniscus of knee 11/11/2013   Formatting of this note might be different from the original. Formatting of this note might be different from the original. ICD-10 cut over Formatting of this note might be different from the original. Overview: ICD-10 cut over   Vertigo      Past Surgical History:  Procedure Laterality Date   COLONOSCOPY     CORONARY ANGIOPLASTY WITH STENT PLACEMENT  2005   x9    CORONARY ARTERY BYPASS GRAFT  2011   CORONARY STENT PLACEMENT  07/2014   ESOPHAGOGASTRODUODENOSCOPY     ESOPHAGOGASTRODUODENOSCOPY (EGD) WITH ESOPHAGEAL DILATION  2018   knee scope Bilateral    LEG SURGERY Right 09/2018   LIPOMA EXCISION N/A 09/24/2016   Procedure: EXCISION SUBCUTANEOUS LIPOMA ON UPPER BACK;  Surgeon: Manus Rudd, MD;  Location: [facility] OR;  Service: General;  Laterality: N/A;   Prostectomy  1995   REVERSE SHOULDER ARTHROPLASTY Left 09/25/2017   REVERSE SHOULDER ARTHROPLASTY Left 09/25/2017   Procedure: LEFT REVERSE SHOULDER ARTHROPLASTY;  deltoid repair;  Surgeon: Beverely Low, MD;  Location: [facility] OR;  Service: Orthopedics;  Laterality: Left;   ROTATOR CUFF REPAIR Left 2001     Current Outpatient Medications  Medication Sig Dispense Refill   acetaminophen (TYLENOL) 650 MG CR tablet Take 650 mg by mouth as needed for pain. 2 tablets in the morning and 2 tablets at bedtime     amLODipine (NORVASC) 10 MG tablet Take 1 tablet (10 mg total) by mouth daily. 90 tablet 3   aspirin EC 81 MG tablet Take 81 mg by mouth daily.     azelastine (OPTIVAR) 0.05 % ophthalmic  solution INSTILL ONE DROP IN EACH EYE twice A DAY 6 mL 5   calcium carbonate (TUMS - DOSED IN MG ELEMENTAL CALCIUM) 500 MG chewable tablet Chew 2 tablets by mouth daily as needed for indigestion or heartburn.     celecoxib (CELEBREX) 200 MG capsule TAKE ONE CAPSULE BY MOUTH AT NOON 30 capsule 5   cholecalciferol (VITAMIN D) 1000 units tablet Take 1,000 Units by mouth daily.     cycloSPORINE (RESTASIS) 0.05 % ophthalmic emulsion Place 1 drop into both eyes 2 (two) times daily.     fenofibrate (TRICOR) 145 MG tablet Take 1 tablet (145 mg total) by mouth daily. 90 tablet 3   fluticasone (FLONASE) 50 MCG/ACT nasal spray Place 1-2 sprays into both nostrils as needed.     ibuprofen (ADVIL) 600 MG tablet Take by mouth.     latanoprost (XALATAN) 0.005 % ophthalmic solution Place 1 drop into both eyes at bedtime.  0   levocetirizine (XYZAL) 5 MG tablet      lisinopril (ZESTRIL) 20 MG tablet TAKE ONE TABLET BY MOUTH TWICE DAILY 180 tablet 1   meclizine (ANTIVERT) 12.5 MG tablet Take 1 tablet by mouth daily.     metoprolol tartrate (LOPRESSOR) 25 MG tablet Take 0.5 tablets (12.5 mg total) by mouth 2 (two) times daily. 180 tablet 0   nitroGLYCERIN (NITROSTAT) 0.4 MG SL tablet Place 1 tablet (0.4 mg total) under the tongue every 5 (five) minutes as needed for chest pain. 30 tablet 3   nystatin-triamcinolone (MYCOLOG II) cream APPLY TO AFFECTED AREA(s) TWICE DAILY 60 g 5   pantoprazole (PROTONIX) 40 MG tablet Take 1 tablet (40 mg total) by mouth 2 (two) times daily. 60 tablet 6   Polyethyl Glycol-Propyl Glycol (SYSTANE OP) Apply 1 drop to eye 5 (five) times daily as needed (dry eyes).     pregabalin (LYRICA) 75 MG capsule TAKE ONE CAPSULE BY MOUTH TWICE DAILY 60 capsule 3   REPATHA SURECLICK 140 MG/ML SOAJ INJECT THE contents of ONE pen into THE SKIN ONCE every 14 DAYS 6 mL 2   zolpidem (AMBIEN) 10 MG tablet TAKE ONE TABLET BY MOUTH EVERYDAY AT BEDTIME FOR INSOMNIA 30 tablet 3   No current  facility-administered medications for this visit.    Allergies:   Docosahexaenoic acid (dha), Lovaza [omega-3-acid ethyl esters], Atorvastatin, Hydrocodone, Ranolazine, Rosuvastatin, Statins, Folic acid, Folic acid-vit U9-WJX b12, and Pravastatin    Social History:   reports that he has never smoked. He has never been exposed to tobacco smoke. He has never used smokeless tobacco. He reports that he does not currently use alcohol after a past usage of about 7.0 standard drinks of alcohol per week. He reports that he does not use drugs.   Family History:  family history includes Alzheimer's disease in his mother; Cancer in his sister; Diabetes Mellitus II in his sister; Obesity in  his sister; Parkinson's disease in his sister; Stroke in his father.    ROS:     Review of Systems  Constitutional: Negative.   HENT: Negative.    Eyes: Negative.   Respiratory: Negative.    Cardiovascular: Negative.   Gastrointestinal: Negative.   Genitourinary: Negative.   Musculoskeletal: Negative.   Skin: Negative.   Neurological: Negative.   Endo/Heme/Allergies: Negative.   Psychiatric/Behavioral: Negative.    All other systems reviewed and are negative.     All other systems are reviewed and negative.    PHYSICAL EXAM: VS:  BP (!) 126/56   Pulse (!) 53   Ht 6' (1.829 m)   Wt 206 lb 9.6 oz (93.7 kg)   SpO2 95%   BMI 28.02 kg/m  , BMI Body mass index is 28.02 kg/m. Last weight:  Wt Readings from Last 3 Encounters:  03/18/23 206 lb 9.6 oz (93.7 kg)  03/10/23 208 lb 12.8 oz (94.7 kg)  02/09/23 207 lb 3.2 oz (94 kg)     Physical Exam Vitals and nursing note reviewed.  Constitutional:      Appearance: Normal appearance. He is normal weight.  HENT:     Head: Normocephalic and atraumatic.     Nose: Nose normal.     Mouth/Throat:     Mouth: Mucous membranes are moist.     Pharynx: Oropharynx is clear.  Eyes:     Extraocular Movements: Extraocular movements intact.      Conjunctiva/sclera: Conjunctivae normal.     Pupils: Pupils are equal, round, and reactive to light.  Cardiovascular:     Rate and Rhythm: Normal rate and regular rhythm.     Pulses: Normal pulses.     Heart sounds: Normal heart sounds.  Pulmonary:     Effort: Pulmonary effort is normal.     Breath sounds: Normal breath sounds.  Abdominal:     General: Abdomen is flat. Bowel sounds are normal.     Palpations: Abdomen is soft.  Musculoskeletal:        General: Normal range of motion.     Cervical back: Normal range of motion.  Skin:    General: Skin is warm and dry.  Neurological:     General: No focal deficit present.     Mental Status: He is alert and oriented to person, place, and time. Mental status is at baseline.  Psychiatric:        Mood and Affect: Mood normal.        Behavior: Behavior normal.        Thought Content: Thought content normal.        Judgment: Judgment normal.     EKG: none today  Recent Labs: 09/02/2022: ALT 13; BUN 22; Creatinine, Ser 1.34; Hemoglobin 12.4; Platelets 209; Potassium 5.0; Sodium 141; TSH 1.260    Lipid Panel    Component Value Date/Time   CHOL 115 09/02/2022 1200   TRIG 111 09/02/2022 1200   HDL 50 09/02/2022 1200   LDLCALC 45 09/02/2022 1200   LDLDIRECT 62 02/18/2021 0808     ASSESSMENT AND PLAN:    ICD-10-CM   1. Coronary artery disease involving coronary bypass graft of native heart with unstable angina pectoris (HCC)  I25.700     2. Essential hypertension  I10     3. Mixed hyperlipidemia  E78.2        Problem List Items Addressed This Visit       Cardiovascular and Mediastinum   Coronary artery disease involving  coronary bypass graft of native heart with unstable angina pectoris [facility]) - Primary    Patient doing well. No complaints today. No further chest pain. Denies shortness of breath.       Essential hypertension    Blood pressure well controlled with current medications.  Continue current therapy.           Other   Hyperlipidemia    08/2022 LDL 45, continue current medications.         Disposition:   Return in about 3 months (around 06/17/2023).    Total time spent: 25 minutes  Signed,  Google, NP  03/18/2023 11:35 AM    [facility]

## 2023-03-18 NOTE — Assessment & Plan Note (Signed)
08/2022 LDL 45, continue current medications.

## 2023-03-18 NOTE — Assessment & Plan Note (Signed)
Blood pressure well controlled with current medications.  Continue current therapy.

## 2023-03-19 ENCOUNTER — Ambulatory Visit: Payer: Medicare HMO | Admitting: Gastroenterology

## 2023-03-19 ENCOUNTER — Other Ambulatory Visit: Payer: Self-pay

## 2023-03-19 MED ORDER — PREGABALIN 75 MG PO CAPS: 75.0000 mg | ORAL_CAPSULE | Freq: Two times a day (BID) | ORAL | 3 refills | Status: DC

## 2023-03-20 ENCOUNTER — Encounter (HOSPITAL_COMMUNITY): Payer: Self-pay | Admitting: Gastroenterology

## 2023-03-20 ENCOUNTER — Other Ambulatory Visit: Payer: Self-pay | Admitting: Family

## 2023-03-22 ENCOUNTER — Encounter: Payer: Self-pay | Admitting: Family

## 2023-03-22 NOTE — Assessment & Plan Note (Signed)
Blood pressure well controlled with current medications.  Continue current therapy.  Will reassess at follow up.  

## 2023-03-22 NOTE — Assessment & Plan Note (Signed)
Checking labs today.  Will continue supplements as needed.  

## 2023-03-22 NOTE — Assessment & Plan Note (Signed)
Patient stable.  Well controlled with current therapy.   Continue current meds.  

## 2023-03-22 NOTE — Assessment & Plan Note (Signed)
Patient is seen by ophthalmology, who manage this condition.  He is well controlled with current therapy.   Will defer to them for further changes to plan of care.

## 2023-03-26 NOTE — Anesthesia Preprocedure Evaluation (Signed)
Anesthesia Evaluation   Patient awake    Reviewed: Allergy & Precautions, NPO status , Patient's Chart, lab work & pertinent test results, reviewed documented beta blocker date and time   History of Anesthesia Complications Negative for: history of anesthetic complications  Airway Mallampati: II  TM Distance: >3 FB Neck ROM: Full    Dental no notable dental hx.    Pulmonary neg pulmonary ROS   Pulmonary exam normal breath sounds clear to auscultation       Cardiovascular hypertension, + CAD, + Past MI and + Peripheral Vascular Disease  Normal cardiovascular exam Rhythm:Regular Rate:Normal  Echocardiogram 08/02/2020:  Normal LV systolic function with visual EF 55-60%. Left ventricle cavity  is normal in size. Mild left ventricular hypertrophy. Normal global wall  motion. Indeterminate diastolic filling pattern.  Left atrial cavity is mildly dilated.  Mild (Grade I) aortic regurgitation.  Mild (Grade I) mitral regurgitation.  Mild tricuspid regurgitation. No evidence of pulmonary hypertension.     Neuro/Psych menieres  negative psych ROS   GI/Hepatic Neg liver ROS, hiatal hernia,GERD  ,,  Endo/Other  negative endocrine ROS    Renal/GU Renal disease  negative genitourinary   Musculoskeletal  (+) Arthritis ,    Abdominal   Peds  Hematology  (+) Blood dyscrasia, anemia   Anesthesia Other Findings   Reproductive/Obstetrics                             Anesthesia Physical Anesthesia Plan  ASA: 3  Anesthesia Plan: MAC   Post-op Pain Management:    Induction: Intravenous  PONV Risk Score and Plan: Propofol infusion and Treatment may vary due to age or medical condition  Airway Management Planned: Natural Airway  Additional Equipment:   Intra-op Plan:   Post-operative Plan:   Informed Consent: I have reviewed the patients History and Physical, chart, labs and discussed the  procedure including the risks, benefits and alternatives for the proposed anesthesia with the patient or authorized representative who has indicated his/her understanding and acceptance.     Dental advisory given  Plan Discussed with: CRNA  Anesthesia Plan Comments:        Anesthesia Quick Evaluation

## 2023-03-27 ENCOUNTER — Other Ambulatory Visit: Payer: Self-pay

## 2023-03-27 ENCOUNTER — Ambulatory Visit (HOSPITAL_BASED_OUTPATIENT_CLINIC_OR_DEPARTMENT_OTHER): Payer: Medicare HMO

## 2023-03-27 ENCOUNTER — Encounter (HOSPITAL_COMMUNITY)
Admission: RE | Disposition: A | Discharge status: Home / Self Care | Payer: Self-pay | Source: Home / Self Care | Attending: Gastroenterology

## 2023-03-27 ENCOUNTER — Encounter (HOSPITAL_COMMUNITY): Payer: Self-pay | Admitting: Gastroenterology

## 2023-03-27 ENCOUNTER — Ambulatory Visit (HOSPITAL_COMMUNITY)
Admission: RE | Admit: 2023-03-27 | Discharge: 2023-03-27 | Disposition: A | Discharge status: Home / Self Care | Payer: Medicare HMO | Attending: Gastroenterology | Admitting: Gastroenterology

## 2023-03-27 ENCOUNTER — Ambulatory Visit (HOSPITAL_COMMUNITY): Payer: Medicare HMO

## 2023-03-27 DIAGNOSIS — D123 Benign neoplasm of transverse colon: Secondary | ICD-10-CM | POA: Diagnosis not present

## 2023-03-27 DIAGNOSIS — I25110 Atherosclerotic heart disease of native coronary artery with unstable angina pectoris: Secondary | ICD-10-CM | POA: Diagnosis not present

## 2023-03-27 DIAGNOSIS — K635 Polyp of colon: Secondary | ICD-10-CM | POA: Diagnosis not present

## 2023-03-27 DIAGNOSIS — D120 Benign neoplasm of cecum: Secondary | ICD-10-CM | POA: Diagnosis not present

## 2023-03-27 DIAGNOSIS — E785 Hyperlipidemia, unspecified: Secondary | ICD-10-CM | POA: Diagnosis not present

## 2023-03-27 DIAGNOSIS — R195 Other fecal abnormalities: Secondary | ICD-10-CM | POA: Insufficient documentation

## 2023-03-27 DIAGNOSIS — K219 Gastro-esophageal reflux disease without esophagitis: Secondary | ICD-10-CM | POA: Diagnosis not present

## 2023-03-27 DIAGNOSIS — Z8546 Personal history of malignant neoplasm of prostate: Secondary | ICD-10-CM | POA: Insufficient documentation

## 2023-03-27 DIAGNOSIS — I252 Old myocardial infarction: Secondary | ICD-10-CM | POA: Insufficient documentation

## 2023-03-27 DIAGNOSIS — Z1211 Encounter for screening for malignant neoplasm of colon: Secondary | ICD-10-CM | POA: Diagnosis not present

## 2023-03-27 DIAGNOSIS — K5730 Diverticulosis of large intestine without perforation or abscess without bleeding: Secondary | ICD-10-CM | POA: Diagnosis not present

## 2023-03-27 DIAGNOSIS — D122 Benign neoplasm of ascending colon: Secondary | ICD-10-CM | POA: Insufficient documentation

## 2023-03-27 DIAGNOSIS — Z86010 Personal history of colonic polyps: Secondary | ICD-10-CM | POA: Insufficient documentation

## 2023-03-27 DIAGNOSIS — I2510 Atherosclerotic heart disease of native coronary artery without angina pectoris: Secondary | ICD-10-CM | POA: Diagnosis not present

## 2023-03-27 DIAGNOSIS — I10 Essential (primary) hypertension: Secondary | ICD-10-CM | POA: Diagnosis not present

## 2023-03-27 DIAGNOSIS — Z955 Presence of coronary angioplasty implant and graft: Secondary | ICD-10-CM | POA: Diagnosis not present

## 2023-03-27 DIAGNOSIS — Z951 Presence of aortocoronary bypass graft: Secondary | ICD-10-CM | POA: Insufficient documentation

## 2023-03-27 DIAGNOSIS — I083 Combined rheumatic disorders of mitral, aortic and tricuspid valves: Secondary | ICD-10-CM | POA: Diagnosis not present

## 2023-03-27 HISTORY — PX: POLYPECTOMY: SHX5525

## 2023-03-27 HISTORY — PX: COLONOSCOPY WITH PROPOFOL: SHX5780

## 2023-03-27 SURGERY — COLONOSCOPY WITH PROPOFOL
Anesthesia: Monitor Anesthesia Care

## 2023-03-27 MED ORDER — LACTATED RINGERS IV SOLN
INTRAVENOUS | Status: AC | PRN
Start: 2023-03-27 — End: 2023-03-27
  Administered 2023-03-27: 1000 mL via INTRAVENOUS

## 2023-03-27 MED ORDER — PROPOFOL 10 MG/ML IV BOLUS
INTRAVENOUS | Status: DC | PRN
  Administered 2023-03-27: 40 mg via INTRAVENOUS

## 2023-03-27 MED ORDER — DROPERIDOL 2.5 MG/ML IJ SOLN
0.6250 mg | Freq: Once | INTRAMUSCULAR | Status: DC | PRN
  Filled 2023-03-27: qty 2

## 2023-03-27 MED ORDER — LIDOCAINE 2% (20 MG/ML) 5 ML SYRINGE
INTRAMUSCULAR | Status: DC | PRN
  Administered 2023-03-27: 100 mg via INTRAVENOUS

## 2023-03-27 MED ORDER — PROPOFOL 500 MG/50ML IV EMUL
INTRAVENOUS | Status: DC | PRN
  Administered 2023-03-27: 200 ug/kg/min via INTRAVENOUS

## 2023-03-27 MED ORDER — PROPOFOL 500 MG/50ML IV EMUL
INTRAVENOUS | Status: AC
  Filled 2023-03-27: qty 50

## 2023-03-27 MED ORDER — PROPOFOL 500 MG/50ML IV EMUL
INTRAVENOUS | Status: AC
  Filled 2023-03-27: qty 100

## 2023-03-27 MED ORDER — EPHEDRINE SULFATE-NACL 50-0.9 MG/10ML-% IV SOSY
PREFILLED_SYRINGE | INTRAVENOUS | Status: DC | PRN
  Administered 2023-03-27 (×2): 10 mg via INTRAVENOUS

## 2023-03-27 MED ORDER — FENTANYL CITRATE (PF) 100 MCG/2ML IJ SOLN: 25.0000 ug | INTRAMUSCULAR | Status: DC | PRN

## 2023-03-27 MED ORDER — SODIUM CHLORIDE 0.9 % IV SOLN: INTRAVENOUS | Status: DC

## 2023-03-27 SURGICAL SUPPLY — 22 items

## 2023-03-27 NOTE — Anesthesia Postprocedure Evaluation (Signed)
Anesthesia Post Note  Patient: [name] [name]  Procedure(s) Performed: COLONOSCOPY WITH PROPOFOL POLYPECTOMY     Patient location during evaluation: PACU Anesthesia Type: MAC Level of consciousness: awake and alert Pain management: pain level controlled Vital Signs Assessment: post-procedure vital signs reviewed and stable Respiratory status: spontaneous breathing, nonlabored ventilation, respiratory function stable and patient connected to nasal cannula oxygen Cardiovascular status: stable and blood pressure returned to baseline Postop Assessment: no apparent nausea or vomiting Anesthetic complications: no   No notable events documented.  Last Vitals:  Vitals:   03/27/23 0900 03/27/23 0910  BP: (!) 114/44 (!) 125/51  Pulse: (!) 59 (!) 55  Resp: (!) 22 (!) 24  Temp:    SpO2: 94% 97%    Last Pain:  Vitals:   03/27/23 0910  TempSrc:   PainSc: 0-No pain                 [place]well Nation

## 2023-03-27 NOTE — Anesthesia Procedure Notes (Signed)
Procedure Name: MAC Date/Time: 03/27/2023 8:06 AM  Performed by: Lovie Chol, CRNAPre-anesthesia Checklist: Patient identified, Emergency Drugs available, Suction available and Patient being monitored Oxygen Delivery Method: Simple face mask

## 2023-03-27 NOTE — H&P (Signed)
[name] [name] HPI: With routine testing the patient was identified to have a positive Cologuard.  He denies any issues with hematochezia or melena.  Past Medical History:  Diagnosis Date   Arthritis    Benign neoplasm of colon 04/13/2008   Overview:  Benign Polyps Of The Large Intestine - colonoscopy 2006,dr Nance Pear of this note might be different from the original. Formatting of this note might be different from the original. Benign Polyps Of The Large Intestine - colonoscopy 2006,dr Nance Pear of this note might be different from the original. Overview: Benign Polyps Of The Large Intestine - colonoscopy 2006,dr    Blurring of visual image 05/28/2018   Cancer (HCC) 1995   prostate ca    Chest pain 07/27/2016   Chest pain, unspecified 07/27/2016   Coronary artery disease    Esophageal stricture 08/2016   GERD (gastroesophageal reflux disease)    Glaucoma    History of hiatal hernia    Hx of CABG 02/24/2018   Formatting of this note might be different from the original. Two-vessel bypass. Formatting of this note might be different from the original. Formatting of this note might be different from the original. Two-vessel bypass. Formatting of this note might be different from the original. Formatting of this note might be different from the original. Two-vessel bypass.   Hypertension    Malignant neoplasm of prostate (HCC) 04/23/2017   MI (mitral incompetence)    MI, acute, non ST segment elevation (HCC) 10/05/2012   Formatting of this note might be different from the original. Note: Unchanged - x4   Old myocardial infarction 05/01/2019   Note: Unchanged - x4   Orthopedic aftercare 10/08/2017   Peripheral neuropathy    Peripheral vascular disease (HCC)    S/P shoulder replacement, left 09/25/2017   Skin sensation disturbance 06/23/2007   Formatting of this note might be different from the original. Tingling (Paresthesia) Formatting of this note might be different  from the original. Formatting of this note might be different from the original. Tingling (Paresthesia)   Tear of meniscus of knee 11/11/2013   Formatting of this note might be different from the original. Formatting of this note might be different from the original. ICD-10 cut over Formatting of this note might be different from the original. Overview: ICD-10 cut over   Vertigo     Past Surgical History:  Procedure Laterality Date   COLONOSCOPY     CORONARY ANGIOPLASTY WITH STENT PLACEMENT  2005   x9    CORONARY ARTERY BYPASS GRAFT  2011   CORONARY STENT PLACEMENT  07/2014   ESOPHAGOGASTRODUODENOSCOPY     ESOPHAGOGASTRODUODENOSCOPY (EGD) WITH ESOPHAGEAL DILATION  2018   knee scope Bilateral    LEG SURGERY Right 09/2018   LIPOMA EXCISION N/A 09/24/2016   Procedure: EXCISION SUBCUTANEOUS LIPOMA ON UPPER BACK;  Surgeon: Manus Rudd, MD;  Location: [facility] OR;  Service: General;  Laterality: N/A;   Prostectomy  1995   REVERSE SHOULDER ARTHROPLASTY Left 09/25/2017   REVERSE SHOULDER ARTHROPLASTY Left 09/25/2017   Procedure: LEFT REVERSE SHOULDER ARTHROPLASTY; deltoid repair;  Surgeon: Beverely Low, MD;  Location: [facility] OR;  Service: Orthopedics;  Laterality: Left;   ROTATOR CUFF REPAIR Left 2001    Family History  Problem Relation Age of Onset   Alzheimer's disease Mother    Stroke Father    Parkinson's disease Sister    Cancer Sister    Obesity Sister    Diabetes Mellitus II Sister  Social History:  reports that he has never smoked. He has never been exposed to tobacco smoke. He has never used smokeless tobacco. He reports that he does not currently use alcohol after a past usage of about 7.0 standard drinks of alcohol per week. He reports that he does not use drugs.  Allergies:  Allergies  Allergen Reactions   Docosahexaenoic Acid (Dha) Diarrhea   Lovaza [Omega-3-Acid Ethyl Esters] Diarrhea   Atorvastatin Other (See Comments) and Nausea Only    Joint pain   Hydrocodone Rash,  Itching and Other (See Comments)   Ranolazine Diarrhea   Rosuvastatin Nausea Only and Other (See Comments)    Joint pain  Other reaction(s): Unknown  Joint pain    Joint pain  Other reaction(s): Unknown   Statins Other (See Comments)    Leg pain    Folic Acid     Note: anxiety itching and nausea   Folic Acid-Vit B6-Vit B12 Anxiety, Itching and Nausea Only   Pravastatin Nausea Only and Other (See Comments)    Medications: Scheduled: Continuous:  sodium chloride      No results found for this or any previous visit (from the past 24 hour(s)).   No results found.  ROS:  As stated above in the HPI otherwise negative.  There were no vitals taken for this visit.    PE: Gen: NAD, Alert and Oriented HEENT:  [place]/AT, EOMI Neck: Supple, no LAD Lungs: CTA Bilaterally CV: RRR without M/G/R ABD: Soft, NTND, +BS Ext: No C/C/E  Assessment/Plan: 1) + Cologuard - Colonoscopy.  Marranda Arakelian D 03/27/2023, 7:34 AM

## 2023-03-27 NOTE — Discharge Instructions (Signed)

## 2023-03-27 NOTE — Transfer of Care (Signed)
Immediate Anesthesia Transfer of Care Note  Patient: [name] [name]  Procedure(s) Performed: COLONOSCOPY WITH PROPOFOL POLYPECTOMY  Patient Location: Endoscopy Unit  Anesthesia Type:MAC  Level of Consciousness: sedated and patient cooperative  Airway & Oxygen Therapy: Patient Spontanous Breathing and Patient connected to face mask oxygen  Post-op Assessment: Report given to RN and Post -op Vital signs reviewed and stable  Post vital signs: Reviewed  Last Vitals:  Vitals Value Taken Time  BP 100/40 03/27/23 0850  Temp    Pulse 52 03/27/23 0850  Resp 16 03/27/23 0850  SpO2 98 % 03/27/23 0850  Vitals shown include unfiled device data.  Last Pain:  Vitals:   03/27/23 0736  TempSrc: Tympanic  PainSc: 0-No pain         Complications: No notable events documented.

## 2023-03-27 NOTE — Op Note (Signed)
[facility] Patient Name: [name] [name] Procedure Date: 03/27/2023 MRN: [id number] Attending MD: Jeani Hawking , MD, [id number] Date of Birth: [birth year] CSN: [id number] Age: 87 Admit Type: Outpatient Procedure:                Colonoscopy Indications:              Positive Cologuard test Providers:                Jeani Hawking, MD, Eliberto Ivory, RN, Harrington Challenger,                            Technician, Romie Minus, CRNA Referring MD:             Jeani Hawking, MD Medicines:                Propofol per Anesthesia Complications:            No immediate complications. Estimated Blood Loss:     Estimated blood loss: none. Procedure:                Pre-Anesthesia Assessment:                           - Prior to the procedure, a History and Physical                            was performed, and patient medications and                            allergies were reviewed. The patient's tolerance of                            previous anesthesia was also reviewed. The risks                            and benefits of the procedure and the sedation                            options and risks were discussed with the patient.                            All questions were answered, and informed consent                            was obtained. Prior Anticoagulants: The patient has                            taken no anticoagulant or antiplatelet agents. ASA                            Grade Assessment: III - A patient with severe                            systemic disease. After reviewing the risks and  benefits, the patient was deemed in satisfactory                            condition to undergo the procedure.                           - Sedation was administered by an anesthesia                            professional. Deep sedation was attained.                           After obtaining informed consent, the colonoscope                            was passed  under direct vision. Throughout the                            procedure, the patient's blood pressure, pulse, and                            oxygen saturations were monitored continuously. The                            CF-HQ190L ([id number]) Olympus colonoscope was                            introduced through the anus and advanced to the the                            cecum, identified by appendiceal orifice and                            ileocecal valve. The colonoscopy was performed                            without difficulty. The patient tolerated the                            procedure well. The quality of the bowel                            preparation was evaluated using the BBPS [facility]                            Bowel Preparation Scale) with scores of: Right                            Colon = 2 (minor amount of residual staining, small                            fragments of stool and/or opaque liquid, but mucosa  seen well), Transverse Colon = 3 (entire mucosa                            seen well with no residual staining, small                            fragments of stool or opaque liquid) and Left Colon                            = 3 (entire mucosa seen well with no residual                            staining, small fragments of stool or opaque                            liquid). The total BBPS score equals 8. The quality                            of the bowel preparation was good. The ileocecal                            valve, appendiceal orifice, and rectum were                            photographed. Scope In: 8:13:48 AM Scope Out: 8:41:20 AM Scope Withdrawal Time: 0 hours 17 minutes 38 seconds  Total Procedure Duration: 0 hours 27 minutes 32 seconds  Findings:      Seven sessile polyps were found in the transverse colon, ascending colon       and cecum. The polyps were 2 to 3 mm in size. These polyps were removed       with a cold snare.  Resection and retrieval were complete.      Scattered medium-mouthed and small-mouthed diverticula were found in the       sigmoid colon. Impression:               - Seven 2 to 3 mm polyps in the transverse colon,                            in the ascending colon and in the cecum, removed                            with a cold snare. Resected and retrieved.                           - Diverticulosis in the sigmoid colon. Moderate Sedation:      Not Applicable - Patient had care per Anesthesia. Recommendation:           - Patient has a contact number available for                            emergencies. The signs and symptoms of potential  delayed complications were discussed with the                            patient. Return to normal activities tomorrow.                            Written discharge instructions were provided to the                            patient.                           - Resume previous diet.                           - Continue present medications.                           - Await pathology results.                           - Repeat colonoscopy is not recommended for                            surveillance. Procedure Code(s):        --- Professional ---                           [phone], Colonoscopy, flexible; with removal of                            tumor(s), polyp(s), or other lesion(s) by snare                            technique Diagnosis Code(s):        --- Professional ---                           D12.3, Benign neoplasm of transverse colon (hepatic                            flexure or splenic flexure)                           D12.2, Benign neoplasm of ascending colon                           D12.0, Benign neoplasm of cecum                           R19.5, Other fecal abnormalities                           K57.30, Diverticulosis of large intestine without                            perforation or abscess without  bleeding CPT copyright 2022 [employer]. All rights  reserved. The codes documented in this report are preliminary and upon coder review may  be revised to meet current compliance requirements. Jeani Hawking, MD Jeani Hawking, MD 03/27/2023 8:50:11 AM This report has been signed electronically. Number of Addenda: 0

## 2023-03-28 ENCOUNTER — Telehealth: Payer: Self-pay | Admitting: Gastroenterology

## 2023-03-28 NOTE — Telephone Encounter (Signed)
Received a call from the patient's sister. Patient had colonoscopy for positive Cologuard yesterday with Dr. Elnoria Howard. He was discharged did well overnight the patient had stayed with his sister. Patient subsequently called sister and stated that this evening patient has had more difficulty swallowing and feeling that he is having potential trapped air.  He has had some regurgitation of foodstuffs.  He is feeling some nausea but not having abdominal pain per report. Patient's sister is not with her brother who lives in [place]. Per the patient's sister, patient does have a history of esophagus problems that he has seen Dr. Elnoria Howard in the past for. Unfortunately we do not have any access to Dr. Haywood Pao clinic or procedure notes at this time. I told the patient's sister to asked the patient to monitor his symptoms. She states that he is tolerating his saliva at this time. Patient is not sure he wants to go to the hospital. I told the patient's sister to check on the patient and if he cannot tolerate eating or drinking anything without continued regurgitation he will likely need to come into the hospital for further evaluation. The patient lives in [place] and may want to go to [facility], which does have a GI group available for consultation if needed through the emergency department. Patient certainly is able to come to [facility] or [place] [facility] since he comes to [place] to see Dr. Elnoria Howard, but that is a decision they will have to make. Emergency department evaluation and recommendations were given pending his ability to tolerate eating versus inability to tolerate eating. The patient's sister will call the brother and talk with him. They can call back if they have further issues but would likely be directed to the emergency department if that is the case. Otherwise I will send this message to Dr. Loreta Ave and Dr. Elnoria Howard so that they can follow-up with the patient on Monday.   Corliss Parish, MD [facility] Office # [phone]

## 2023-03-30 ENCOUNTER — Encounter (HOSPITAL_COMMUNITY): Payer: Self-pay | Admitting: Gastroenterology

## 2023-03-30 LAB — SURGICAL PATHOLOGY

## 2023-03-31 DIAGNOSIS — K219 Gastro-esophageal reflux disease without esophagitis: Secondary | ICD-10-CM | POA: Diagnosis not present

## 2023-03-31 DIAGNOSIS — G629 Polyneuropathy, unspecified: Secondary | ICD-10-CM | POA: Diagnosis not present

## 2023-03-31 DIAGNOSIS — E782 Mixed hyperlipidemia: Secondary | ICD-10-CM | POA: Diagnosis not present

## 2023-03-31 DIAGNOSIS — I252 Old myocardial infarction: Secondary | ICD-10-CM | POA: Diagnosis not present

## 2023-03-31 DIAGNOSIS — I10 Essential (primary) hypertension: Secondary | ICD-10-CM | POA: Diagnosis not present

## 2023-03-31 DIAGNOSIS — H353230 Exudative age-related macular degeneration, bilateral, stage unspecified: Secondary | ICD-10-CM | POA: Diagnosis not present

## 2023-03-31 DIAGNOSIS — I739 Peripheral vascular disease, unspecified: Secondary | ICD-10-CM | POA: Diagnosis not present

## 2023-03-31 DIAGNOSIS — I25700 Atherosclerosis of coronary artery bypass graft(s), unspecified, with unstable angina pectoris: Secondary | ICD-10-CM | POA: Diagnosis not present

## 2023-03-31 DIAGNOSIS — K449 Diaphragmatic hernia without obstruction or gangrene: Secondary | ICD-10-CM | POA: Diagnosis not present

## 2023-04-03 DIAGNOSIS — G629 Polyneuropathy, unspecified: Secondary | ICD-10-CM | POA: Diagnosis not present

## 2023-04-03 DIAGNOSIS — K219 Gastro-esophageal reflux disease without esophagitis: Secondary | ICD-10-CM | POA: Diagnosis not present

## 2023-04-03 DIAGNOSIS — H353230 Exudative age-related macular degeneration, bilateral, stage unspecified: Secondary | ICD-10-CM | POA: Diagnosis not present

## 2023-04-03 DIAGNOSIS — I10 Essential (primary) hypertension: Secondary | ICD-10-CM | POA: Diagnosis not present

## 2023-04-03 DIAGNOSIS — E782 Mixed hyperlipidemia: Secondary | ICD-10-CM | POA: Diagnosis not present

## 2023-04-03 DIAGNOSIS — I739 Peripheral vascular disease, unspecified: Secondary | ICD-10-CM | POA: Diagnosis not present

## 2023-04-03 DIAGNOSIS — I25700 Atherosclerosis of coronary artery bypass graft(s), unspecified, with unstable angina pectoris: Secondary | ICD-10-CM | POA: Diagnosis not present

## 2023-04-03 DIAGNOSIS — K449 Diaphragmatic hernia without obstruction or gangrene: Secondary | ICD-10-CM | POA: Diagnosis not present

## 2023-04-03 DIAGNOSIS — I252 Old myocardial infarction: Secondary | ICD-10-CM | POA: Diagnosis not present

## 2023-04-07 DIAGNOSIS — K449 Diaphragmatic hernia without obstruction or gangrene: Secondary | ICD-10-CM | POA: Diagnosis not present

## 2023-04-07 DIAGNOSIS — K219 Gastro-esophageal reflux disease without esophagitis: Secondary | ICD-10-CM | POA: Diagnosis not present

## 2023-04-07 DIAGNOSIS — G629 Polyneuropathy, unspecified: Secondary | ICD-10-CM | POA: Diagnosis not present

## 2023-04-07 DIAGNOSIS — E782 Mixed hyperlipidemia: Secondary | ICD-10-CM | POA: Diagnosis not present

## 2023-04-07 DIAGNOSIS — I739 Peripheral vascular disease, unspecified: Secondary | ICD-10-CM | POA: Diagnosis not present

## 2023-04-07 DIAGNOSIS — I25700 Atherosclerosis of coronary artery bypass graft(s), unspecified, with unstable angina pectoris: Secondary | ICD-10-CM | POA: Diagnosis not present

## 2023-04-07 DIAGNOSIS — H353230 Exudative age-related macular degeneration, bilateral, stage unspecified: Secondary | ICD-10-CM | POA: Diagnosis not present

## 2023-04-07 DIAGNOSIS — I252 Old myocardial infarction: Secondary | ICD-10-CM | POA: Diagnosis not present

## 2023-04-07 DIAGNOSIS — I10 Essential (primary) hypertension: Secondary | ICD-10-CM | POA: Diagnosis not present

## 2023-04-09 DIAGNOSIS — I739 Peripheral vascular disease, unspecified: Secondary | ICD-10-CM | POA: Diagnosis not present

## 2023-04-09 DIAGNOSIS — M1712 Unilateral primary osteoarthritis, left knee: Secondary | ICD-10-CM | POA: Diagnosis not present

## 2023-04-09 DIAGNOSIS — M170 Bilateral primary osteoarthritis of knee: Secondary | ICD-10-CM | POA: Diagnosis not present

## 2023-04-09 DIAGNOSIS — M1711 Unilateral primary osteoarthritis, right knee: Secondary | ICD-10-CM | POA: Diagnosis not present

## 2023-04-09 DIAGNOSIS — M4727 Other spondylosis with radiculopathy, lumbosacral region: Secondary | ICD-10-CM | POA: Diagnosis not present

## 2023-04-10 ENCOUNTER — Ambulatory Visit: Payer: Medicare HMO

## 2023-04-13 ENCOUNTER — Telehealth: Payer: Self-pay | Admitting: Family

## 2023-04-13 ENCOUNTER — Ambulatory Visit (INDEPENDENT_AMBULATORY_CARE_PROVIDER_SITE_OTHER): Payer: Medicare HMO | Admitting: Family

## 2023-04-13 DIAGNOSIS — E538 Deficiency of other specified B group vitamins: Secondary | ICD-10-CM

## 2023-04-13 MED ORDER — CYANOCOBALAMIN 1000 MCG/ML IJ SOLN
1000.0000 ug | Freq: Once | INTRAMUSCULAR | Status: AC
Start: 2023-04-13 — End: 2023-04-13
  Administered 2023-04-13: 1000 ug via INTRAMUSCULAR

## 2023-04-13 MED ORDER — OFLOXACIN 0.3 % OT SOLN: 5.0000 [drp] | Freq: Every day | OTIC | 0 refills | Status: DC

## 2023-04-13 NOTE — Telephone Encounter (Signed)
Patient called in stating that he has an ear infection. States he is having sharp pains in his left ear. Wants to know if we can send him in something.   [facility]

## 2023-04-13 NOTE — Telephone Encounter (Signed)
[name] has sent something to the pharmacy

## 2023-04-13 NOTE — Progress Notes (Signed)
   CHIEF COMPLAINT  B12 Shot     REASON FOR VISIT  B12 Injection     ASSESSMENT  B12 Deficiency, Unspecified     PLAN  Diagnoses and all orders for this visit:  Vitamin B12 deficiency -     cyanocobalamin (VITAMIN B12) injection 1,000 mcg  Other orders -     ofloxacin (FLOXIN) 0.3 % OTIC solution; Place 5 drops into the left ear daily.     Pt. given B12 injection in clinic.  Return for next injection per provider instructions.   Total time spent: 5 minutes  Miki Kins, FNP  04/13/2023

## 2023-04-14 DIAGNOSIS — E782 Mixed hyperlipidemia: Secondary | ICD-10-CM | POA: Diagnosis not present

## 2023-04-14 DIAGNOSIS — K449 Diaphragmatic hernia without obstruction or gangrene: Secondary | ICD-10-CM | POA: Diagnosis not present

## 2023-04-14 DIAGNOSIS — I739 Peripheral vascular disease, unspecified: Secondary | ICD-10-CM | POA: Diagnosis not present

## 2023-04-14 DIAGNOSIS — K219 Gastro-esophageal reflux disease without esophagitis: Secondary | ICD-10-CM | POA: Diagnosis not present

## 2023-04-14 DIAGNOSIS — H353230 Exudative age-related macular degeneration, bilateral, stage unspecified: Secondary | ICD-10-CM | POA: Diagnosis not present

## 2023-04-14 DIAGNOSIS — I25700 Atherosclerosis of coronary artery bypass graft(s), unspecified, with unstable angina pectoris: Secondary | ICD-10-CM | POA: Diagnosis not present

## 2023-04-14 DIAGNOSIS — I10 Essential (primary) hypertension: Secondary | ICD-10-CM | POA: Diagnosis not present

## 2023-04-14 DIAGNOSIS — I252 Old myocardial infarction: Secondary | ICD-10-CM | POA: Diagnosis not present

## 2023-04-14 DIAGNOSIS — G629 Polyneuropathy, unspecified: Secondary | ICD-10-CM | POA: Diagnosis not present

## 2023-04-17 DIAGNOSIS — E782 Mixed hyperlipidemia: Secondary | ICD-10-CM | POA: Diagnosis not present

## 2023-04-17 DIAGNOSIS — H353230 Exudative age-related macular degeneration, bilateral, stage unspecified: Secondary | ICD-10-CM | POA: Diagnosis not present

## 2023-04-17 DIAGNOSIS — K219 Gastro-esophageal reflux disease without esophagitis: Secondary | ICD-10-CM | POA: Diagnosis not present

## 2023-04-17 DIAGNOSIS — K449 Diaphragmatic hernia without obstruction or gangrene: Secondary | ICD-10-CM | POA: Diagnosis not present

## 2023-04-17 DIAGNOSIS — I25700 Atherosclerosis of coronary artery bypass graft(s), unspecified, with unstable angina pectoris: Secondary | ICD-10-CM | POA: Diagnosis not present

## 2023-04-17 DIAGNOSIS — I10 Essential (primary) hypertension: Secondary | ICD-10-CM | POA: Diagnosis not present

## 2023-04-17 DIAGNOSIS — I739 Peripheral vascular disease, unspecified: Secondary | ICD-10-CM | POA: Diagnosis not present

## 2023-04-17 DIAGNOSIS — I252 Old myocardial infarction: Secondary | ICD-10-CM | POA: Diagnosis not present

## 2023-04-17 DIAGNOSIS — G629 Polyneuropathy, unspecified: Secondary | ICD-10-CM | POA: Diagnosis not present

## 2023-04-21 DIAGNOSIS — G629 Polyneuropathy, unspecified: Secondary | ICD-10-CM | POA: Diagnosis not present

## 2023-04-21 DIAGNOSIS — E782 Mixed hyperlipidemia: Secondary | ICD-10-CM | POA: Diagnosis not present

## 2023-04-21 DIAGNOSIS — I739 Peripheral vascular disease, unspecified: Secondary | ICD-10-CM | POA: Diagnosis not present

## 2023-04-21 DIAGNOSIS — H353230 Exudative age-related macular degeneration, bilateral, stage unspecified: Secondary | ICD-10-CM | POA: Diagnosis not present

## 2023-04-21 DIAGNOSIS — I252 Old myocardial infarction: Secondary | ICD-10-CM | POA: Diagnosis not present

## 2023-04-21 DIAGNOSIS — K219 Gastro-esophageal reflux disease without esophagitis: Secondary | ICD-10-CM | POA: Diagnosis not present

## 2023-04-21 DIAGNOSIS — K449 Diaphragmatic hernia without obstruction or gangrene: Secondary | ICD-10-CM | POA: Diagnosis not present

## 2023-04-21 DIAGNOSIS — I25700 Atherosclerosis of coronary artery bypass graft(s), unspecified, with unstable angina pectoris: Secondary | ICD-10-CM | POA: Diagnosis not present

## 2023-04-21 DIAGNOSIS — I10 Essential (primary) hypertension: Secondary | ICD-10-CM | POA: Diagnosis not present

## 2023-04-23 DIAGNOSIS — H353211 Exudative age-related macular degeneration, right eye, with active choroidal neovascularization: Secondary | ICD-10-CM | POA: Diagnosis not present

## 2023-04-28 DIAGNOSIS — H353230 Exudative age-related macular degeneration, bilateral, stage unspecified: Secondary | ICD-10-CM | POA: Diagnosis not present

## 2023-04-28 DIAGNOSIS — I10 Essential (primary) hypertension: Secondary | ICD-10-CM | POA: Diagnosis not present

## 2023-04-28 DIAGNOSIS — G629 Polyneuropathy, unspecified: Secondary | ICD-10-CM | POA: Diagnosis not present

## 2023-04-28 DIAGNOSIS — I25700 Atherosclerosis of coronary artery bypass graft(s), unspecified, with unstable angina pectoris: Secondary | ICD-10-CM | POA: Diagnosis not present

## 2023-04-28 DIAGNOSIS — I739 Peripheral vascular disease, unspecified: Secondary | ICD-10-CM | POA: Diagnosis not present

## 2023-04-28 DIAGNOSIS — K219 Gastro-esophageal reflux disease without esophagitis: Secondary | ICD-10-CM | POA: Diagnosis not present

## 2023-04-28 DIAGNOSIS — K449 Diaphragmatic hernia without obstruction or gangrene: Secondary | ICD-10-CM | POA: Diagnosis not present

## 2023-04-28 DIAGNOSIS — I252 Old myocardial infarction: Secondary | ICD-10-CM | POA: Diagnosis not present

## 2023-04-28 DIAGNOSIS — E782 Mixed hyperlipidemia: Secondary | ICD-10-CM | POA: Diagnosis not present

## 2023-05-08 DIAGNOSIS — G629 Polyneuropathy, unspecified: Secondary | ICD-10-CM | POA: Diagnosis not present

## 2023-05-08 DIAGNOSIS — K449 Diaphragmatic hernia without obstruction or gangrene: Secondary | ICD-10-CM | POA: Diagnosis not present

## 2023-05-08 DIAGNOSIS — I25700 Atherosclerosis of coronary artery bypass graft(s), unspecified, with unstable angina pectoris: Secondary | ICD-10-CM | POA: Diagnosis not present

## 2023-05-08 DIAGNOSIS — K219 Gastro-esophageal reflux disease without esophagitis: Secondary | ICD-10-CM | POA: Diagnosis not present

## 2023-05-08 DIAGNOSIS — H353230 Exudative age-related macular degeneration, bilateral, stage unspecified: Secondary | ICD-10-CM | POA: Diagnosis not present

## 2023-05-08 DIAGNOSIS — I739 Peripheral vascular disease, unspecified: Secondary | ICD-10-CM | POA: Diagnosis not present

## 2023-05-08 DIAGNOSIS — I10 Essential (primary) hypertension: Secondary | ICD-10-CM | POA: Diagnosis not present

## 2023-05-08 DIAGNOSIS — E782 Mixed hyperlipidemia: Secondary | ICD-10-CM | POA: Diagnosis not present

## 2023-05-08 DIAGNOSIS — I252 Old myocardial infarction: Secondary | ICD-10-CM | POA: Diagnosis not present

## 2023-05-13 ENCOUNTER — Ambulatory Visit (INDEPENDENT_AMBULATORY_CARE_PROVIDER_SITE_OTHER): Payer: Medicare HMO | Admitting: Family

## 2023-05-13 DIAGNOSIS — E538 Deficiency of other specified B group vitamins: Secondary | ICD-10-CM

## 2023-05-13 MED ORDER — CYANOCOBALAMIN 1000 MCG/ML IJ SOLN
1000.0000 ug | Freq: Once | INTRAMUSCULAR | Status: AC
  Administered 2023-05-13: 1000 ug via INTRAMUSCULAR

## 2023-05-13 NOTE — Progress Notes (Signed)
   CHIEF COMPLAINT  B12 Shot     REASON FOR VISIT  B12 Injection     ASSESSMENT  B12 Deficiency, Unspecified     PLAN  Diagnoses and all orders for this visit:  Vitamin B12 deficiency -     cyanocobalamin (VITAMIN B12) injection 1,000 mcg     Pt. given B12 injection in clinic.  Return for next injection per provider instructions.   Total time spent: 5 minutes  Miki Kins, FNP  05/13/2023

## 2023-05-14 DIAGNOSIS — E782 Mixed hyperlipidemia: Secondary | ICD-10-CM | POA: Diagnosis not present

## 2023-05-14 DIAGNOSIS — I10 Essential (primary) hypertension: Secondary | ICD-10-CM | POA: Diagnosis not present

## 2023-05-14 DIAGNOSIS — I25700 Atherosclerosis of coronary artery bypass graft(s), unspecified, with unstable angina pectoris: Secondary | ICD-10-CM | POA: Diagnosis not present

## 2023-05-14 DIAGNOSIS — I739 Peripheral vascular disease, unspecified: Secondary | ICD-10-CM | POA: Diagnosis not present

## 2023-05-14 DIAGNOSIS — H353230 Exudative age-related macular degeneration, bilateral, stage unspecified: Secondary | ICD-10-CM | POA: Diagnosis not present

## 2023-05-14 DIAGNOSIS — K449 Diaphragmatic hernia without obstruction or gangrene: Secondary | ICD-10-CM | POA: Diagnosis not present

## 2023-05-14 DIAGNOSIS — G629 Polyneuropathy, unspecified: Secondary | ICD-10-CM | POA: Diagnosis not present

## 2023-05-14 DIAGNOSIS — K219 Gastro-esophageal reflux disease without esophagitis: Secondary | ICD-10-CM | POA: Diagnosis not present

## 2023-05-14 DIAGNOSIS — I252 Old myocardial infarction: Secondary | ICD-10-CM | POA: Diagnosis not present

## 2023-05-22 DIAGNOSIS — I25700 Atherosclerosis of coronary artery bypass graft(s), unspecified, with unstable angina pectoris: Secondary | ICD-10-CM | POA: Diagnosis not present

## 2023-05-22 DIAGNOSIS — E782 Mixed hyperlipidemia: Secondary | ICD-10-CM | POA: Diagnosis not present

## 2023-05-22 DIAGNOSIS — K219 Gastro-esophageal reflux disease without esophagitis: Secondary | ICD-10-CM | POA: Diagnosis not present

## 2023-05-22 DIAGNOSIS — H353230 Exudative age-related macular degeneration, bilateral, stage unspecified: Secondary | ICD-10-CM | POA: Diagnosis not present

## 2023-05-22 DIAGNOSIS — I10 Essential (primary) hypertension: Secondary | ICD-10-CM | POA: Diagnosis not present

## 2023-05-22 DIAGNOSIS — K449 Diaphragmatic hernia without obstruction or gangrene: Secondary | ICD-10-CM | POA: Diagnosis not present

## 2023-05-22 DIAGNOSIS — I252 Old myocardial infarction: Secondary | ICD-10-CM | POA: Diagnosis not present

## 2023-05-22 DIAGNOSIS — I739 Peripheral vascular disease, unspecified: Secondary | ICD-10-CM | POA: Diagnosis not present

## 2023-05-22 DIAGNOSIS — G629 Polyneuropathy, unspecified: Secondary | ICD-10-CM | POA: Diagnosis not present

## 2023-05-27 DIAGNOSIS — K219 Gastro-esophageal reflux disease without esophagitis: Secondary | ICD-10-CM | POA: Diagnosis not present

## 2023-05-27 DIAGNOSIS — I739 Peripheral vascular disease, unspecified: Secondary | ICD-10-CM | POA: Diagnosis not present

## 2023-05-27 DIAGNOSIS — G629 Polyneuropathy, unspecified: Secondary | ICD-10-CM | POA: Diagnosis not present

## 2023-05-27 DIAGNOSIS — E782 Mixed hyperlipidemia: Secondary | ICD-10-CM | POA: Diagnosis not present

## 2023-05-27 DIAGNOSIS — I10 Essential (primary) hypertension: Secondary | ICD-10-CM | POA: Diagnosis not present

## 2023-05-27 DIAGNOSIS — H353230 Exudative age-related macular degeneration, bilateral, stage unspecified: Secondary | ICD-10-CM | POA: Diagnosis not present

## 2023-05-27 DIAGNOSIS — K449 Diaphragmatic hernia without obstruction or gangrene: Secondary | ICD-10-CM | POA: Diagnosis not present

## 2023-05-27 DIAGNOSIS — I25700 Atherosclerosis of coronary artery bypass graft(s), unspecified, with unstable angina pectoris: Secondary | ICD-10-CM | POA: Diagnosis not present

## 2023-05-27 DIAGNOSIS — I252 Old myocardial infarction: Secondary | ICD-10-CM | POA: Diagnosis not present

## 2023-06-10 ENCOUNTER — Other Ambulatory Visit: Payer: Medicare HMO

## 2023-06-10 ENCOUNTER — Ambulatory Visit: Payer: Medicare HMO | Admitting: Family

## 2023-06-10 DIAGNOSIS — E782 Mixed hyperlipidemia: Secondary | ICD-10-CM

## 2023-06-10 DIAGNOSIS — Z131 Encounter for screening for diabetes mellitus: Secondary | ICD-10-CM | POA: Diagnosis not present

## 2023-06-10 DIAGNOSIS — E538 Deficiency of other specified B group vitamins: Secondary | ICD-10-CM | POA: Diagnosis not present

## 2023-06-10 DIAGNOSIS — I10 Essential (primary) hypertension: Secondary | ICD-10-CM | POA: Diagnosis not present

## 2023-06-10 MED ORDER — CYANOCOBALAMIN 1000 MCG/ML IJ SOLN
1000.0000 ug | Freq: Once | INTRAMUSCULAR | Status: AC
  Administered 2023-06-10: 1000 ug via INTRAMUSCULAR

## 2023-06-11 LAB — LIPID PANEL
Chol/HDL Ratio: 2.4 {ratio} (ref 0.0–5.0)
Cholesterol, Total: 117 mg/dL (ref 100–199)
HDL: 48 mg/dL (ref >39)
LDL Chol Calc (NIH): 53 mg/dL (ref 0–99)
Triglycerides: 81 mg/dL (ref 0–149)
VLDL Cholesterol Cal: 16 mg/dL (ref 5–40)

## 2023-06-11 LAB — CBC WITH DIFFERENTIAL/PLATELET
Basophils Absolute: 0 10*3/uL (ref 0.0–0.2)
Basos: 1 %
EOS (ABSOLUTE): 0.2 10*3/uL (ref 0.0–0.4)
Eos: 4 %
Hematocrit: 41.9 % (ref 37.5–51.0)
Hemoglobin: 13.9 g/dL (ref 13.0–17.7)
Immature Grans (Abs): 0 10*3/uL (ref 0.0–0.1)
Immature Granulocytes: 0 %
Lymphocytes Absolute: 1.3 10*3/uL (ref 0.7–3.1)
Lymphs: 28 %
MCH: 29.4 pg (ref 26.6–33.0)
MCHC: 33.2 g/dL (ref 31.5–35.7)
MCV: 89 fL (ref 79–97)
Monocytes Absolute: 0.5 10*3/uL (ref 0.1–0.9)
Monocytes: 10 %
Neutrophils Absolute: 2.6 10*3/uL (ref 1.4–7.0)
Neutrophils: 57 %
Platelets: 187 10*3/uL (ref 150–450)
RBC: 4.73 x10E6/uL (ref 4.14–5.80)
RDW: 12.7 % (ref 11.6–15.4)
WBC: 4.5 10*3/uL (ref 3.4–10.8)

## 2023-06-11 LAB — CMP14+EGFR
ALT: 15 [IU]/L (ref 0–44)
AST: 11 [IU]/L (ref 0–40)
Albumin: 4.3 g/dL (ref 3.7–4.7)
Alkaline Phosphatase: 42 [IU]/L — ABNORMAL LOW (ref 44–121)
BUN/Creatinine Ratio: 21 (ref 10–24)
BUN: 26 mg/dL (ref 8–27)
Bilirubin Total: 0.4 mg/dL (ref 0.0–1.2)
CO2: 23 mmol/L (ref 20–29)
Calcium: 10.3 mg/dL — ABNORMAL HIGH (ref 8.6–10.2)
Chloride: 105 mmol/L (ref 96–106)
Creatinine, Ser: 1.25 mg/dL (ref 0.76–1.27)
Globulin, Total: 2.2 g/dL (ref 1.5–4.5)
Glucose: 95 mg/dL (ref 70–99)
Potassium: 5 mmol/L (ref 3.5–5.2)
Sodium: 141 mmol/L (ref 134–144)
Total Protein: 6.5 g/dL (ref 6.0–8.5)
eGFR: 56 mL/min/{1.73_m2} — ABNORMAL LOW (ref >59)

## 2023-06-11 LAB — HEMOGLOBIN A1C
Est. average glucose Bld gHb Est-mCnc: 123 mg/dL
Hgb A1c MFr Bld: 5.9 % — ABNORMAL HIGH (ref 4.8–5.6)

## 2023-06-12 ENCOUNTER — Ambulatory Visit: Payer: Medicare HMO | Admitting: Family

## 2023-06-15 ENCOUNTER — Other Ambulatory Visit: Payer: Self-pay | Admitting: Cardiovascular Disease

## 2023-06-15 DIAGNOSIS — I25700 Atherosclerosis of coronary artery bypass graft(s), unspecified, with unstable angina pectoris: Secondary | ICD-10-CM

## 2023-06-16 ENCOUNTER — Ambulatory Visit: Payer: Medicare HMO | Admitting: Family

## 2023-06-17 ENCOUNTER — Ambulatory Visit: Payer: Medicare HMO | Admitting: Cardiology

## 2023-06-18 DIAGNOSIS — H353211 Exudative age-related macular degeneration, right eye, with active choroidal neovascularization: Secondary | ICD-10-CM | POA: Diagnosis not present

## 2023-06-24 ENCOUNTER — Encounter: Payer: Self-pay | Admitting: Cardiology

## 2023-06-24 ENCOUNTER — Ambulatory Visit (INDEPENDENT_AMBULATORY_CARE_PROVIDER_SITE_OTHER): Payer: Medicare HMO | Admitting: Cardiology

## 2023-06-24 VITALS — BP 140/60 | HR 58 | Ht 72.0 in | Wt 207.8 lb

## 2023-06-24 DIAGNOSIS — I25700 Atherosclerosis of coronary artery bypass graft(s), unspecified, with unstable angina pectoris: Secondary | ICD-10-CM | POA: Diagnosis not present

## 2023-06-24 DIAGNOSIS — I10 Essential (primary) hypertension: Secondary | ICD-10-CM

## 2023-06-24 DIAGNOSIS — E782 Mixed hyperlipidemia: Secondary | ICD-10-CM | POA: Diagnosis not present

## 2023-06-24 NOTE — Assessment & Plan Note (Signed)
Blood pressure well controlled with current medications.  Continue current therapy.

## 2023-06-24 NOTE — Assessment & Plan Note (Signed)
Well controlled with current therapy. Continue current meds.

## 2023-06-24 NOTE — Assessment & Plan Note (Signed)
Patient doing well. No complaints today. No further chest pain. Denies shortness of breath.

## 2023-06-24 NOTE — Progress Notes (Signed)
Established Patient Office Visit  Subjective:  Patient ID: [name] [name], male    DOB: Jan 18, 1936  Age: 87 y.o. MRN: [id number]  Chief Complaint  Patient presents with   Follow-up    3 mo    Patient in office for routine cardiac exam. Denies chest pain, shortness of breath, dizziness, lower extremity edema.     No other concerns at this time.   Past Medical History:  Diagnosis Date   Arthritis    Benign neoplasm of colon 04/13/2008   Overview:  Benign Polyps Of The Large Intestine - colonoscopy 2006,dr Nance Pear of this note might be different from the original. Formatting of this note might be different from the original. Benign Polyps Of The Large Intestine - colonoscopy 2006,dr Nance Pear of this note might be different from the original. Overview: Benign Polyps Of The Large Intestine - colonoscopy 2006,dr    Blurring of visual image 05/28/2018   Cancer (HCC) 1995   prostate ca    Chest pain 07/27/2016   Chest pain, unspecified 07/27/2016   Coronary artery disease    Esophageal stricture 08/2016   GERD (gastroesophageal reflux disease)    Glaucoma    History of hiatal hernia    Hx of CABG 02/24/2018   Formatting of this note might be different from the original. Two-vessel bypass. Formatting of this note might be different from the original. Formatting of this note might be different from the original. Two-vessel bypass. Formatting of this note might be different from the original. Formatting of this note might be different from the original. Two-vessel bypass.   Hypertension    Malignant neoplasm of prostate (HCC) 04/23/2017   MI (mitral incompetence)    MI, acute, non ST segment elevation (HCC) 10/05/2012   Formatting of this note might be different from the original. Note: Unchanged - x4   Old myocardial infarction 05/01/2019   Note: Unchanged - x4   Orthopedic aftercare 10/08/2017   Peripheral neuropathy    Peripheral vascular disease (HCC)     S/P shoulder replacement, left 09/25/2017   Skin sensation disturbance 06/23/2007   Formatting of this note might be different from the original. Tingling (Paresthesia) Formatting of this note might be different from the original. Formatting of this note might be different from the original. Tingling (Paresthesia)   Tear of meniscus of knee 11/11/2013   Formatting of this note might be different from the original. Formatting of this note might be different from the original. ICD-10 cut over Formatting of this note might be different from the original. Overview: ICD-10 cut over   Vertigo     Past Surgical History:  Procedure Laterality Date   COLONOSCOPY     COLONOSCOPY WITH PROPOFOL N/A 03/27/2023   Procedure: COLONOSCOPY WITH PROPOFOL;  Surgeon: Jeani Hawking, MD;  Location: [facility];  Service: Gastroenterology;  Laterality: N/A;   CORONARY ANGIOPLASTY WITH STENT PLACEMENT  2005   x9    CORONARY ARTERY BYPASS GRAFT  2011   CORONARY STENT PLACEMENT  07/2014   ESOPHAGOGASTRODUODENOSCOPY     ESOPHAGOGASTRODUODENOSCOPY (EGD) WITH ESOPHAGEAL DILATION  2018   knee scope Bilateral    LEG SURGERY Right 09/2018   LIPOMA EXCISION N/A 09/24/2016   Procedure: EXCISION SUBCUTANEOUS LIPOMA ON UPPER BACK;  Surgeon: Manus Rudd, MD;  Location: [facility] OR;  Service: General;  Laterality: N/A;   POLYPECTOMY  03/27/2023   Procedure: POLYPECTOMY;  Surgeon: Jeani Hawking, MD;  Location: [facility];  Service: Gastroenterology;;   Tomie China  1995   REVERSE SHOULDER ARTHROPLASTY Left 09/25/2017   REVERSE SHOULDER ARTHROPLASTY Left 09/25/2017   Procedure: LEFT REVERSE SHOULDER ARTHROPLASTY; deltoid repair;  Surgeon: Beverely Low, MD;  Location: [facility] OR;  Service: Orthopedics;  Laterality: Left;   ROTATOR CUFF REPAIR Left 2001    Social History   Socioeconomic History   Marital status: Divorced    Spouse name: Not on file   Number of children: 6   Years of education: Not on file   Highest education  level: Not on file  Occupational History   Not on file  Tobacco Use   Smoking status: Never    Passive exposure: Never   Smokeless tobacco: Never  Vaping Use   Vaping status: Never Used  Substance and Sexual Activity   Alcohol use: Not Currently    Alcohol/week: 7.0 standard drinks of alcohol    Types: 7 Glasses of wine per week    Comment: wine with dinner   Drug use: No   Sexual activity: Not Currently  Other Topics Concern   Not on file  Social History Narrative   Not on file   Social Determinants of Health   Financial Resource Strain: Not on file  Food Insecurity: Not on file  Transportation Needs: Not on file  Physical Activity: Not on file  Stress: Not on file  Social Connections: Not on file  Intimate Partner Violence: Not on file    Family History  Problem Relation Age of Onset   Alzheimer's disease Mother    Stroke Father    Parkinson's disease Sister    Cancer Sister    Obesity Sister    Diabetes Mellitus II Sister     Allergies  Allergen Reactions   Docosahexaenoic Acid (Dha) (Fish) Diarrhea   Lovaza [Omega-3-Acid Ethyl Esters (Fish)] Diarrhea   Atorvastatin Other (See Comments) and Nausea Only    Joint pain   Hydrocodone Rash, Itching and Other (See Comments)   Ranolazine Diarrhea   Rosuvastatin Nausea Only and Other (See Comments)    Joint pain  Other reaction(s): Unknown  Joint pain    Joint pain  Other reaction(s): Unknown   Statins Other (See Comments)    Leg pain    Folic Acid     Note: anxiety itching and nausea   Folic Acid-Vit B6-Vit B12 Anxiety, Itching and Nausea Only   Pravastatin Nausea Only and Other (See Comments)    Outpatient Medications Prior to Visit  Medication Sig   acetaminophen (TYLENOL) 650 MG CR tablet Take 650 mg by mouth as needed for pain. 2 tablets in the morning and 2 tablets at bedtime   amLODipine (NORVASC) 10 MG tablet Take 1 tablet (10 mg total) by mouth daily.   aspirin EC 81 MG tablet Take 81 mg by  mouth daily.   azelastine (OPTIVAR) 0.05 % ophthalmic solution INSTILL ONE DROP IN [facility] EYE twice A DAY   calcium carbonate (TUMS - DOSED IN MG ELEMENTAL CALCIUM) 500 MG chewable tablet Chew 2 tablets by mouth daily as needed for indigestion or heartburn.   celecoxib (CELEBREX) 200 MG capsule TAKE ONE CAPSULE BY MOUTH AT NOON   cholecalciferol (VITAMIN D) 1000 units tablet Take 1,000 Units by mouth daily.   cycloSPORINE (RESTASIS) 0.05 % ophthalmic emulsion Place 1 drop into both eyes 2 (two) times daily.   fenofibrate (TRICOR) 145 MG tablet Take 1 tablet (145 mg total) by mouth daily.   fluticasone (FLONASE) 50 MCG/ACT nasal spray Place 1-2 sprays into both nostrils  as needed.   ibuprofen (ADVIL) 600 MG tablet Take by mouth.   latanoprost (XALATAN) 0.005 % ophthalmic solution PLACE 1 DROP INTO AFFECTED EYE ONCE EVERY EVENING FOR GLUCOMA   levocetirizine (XYZAL) 5 MG tablet    lisinopril (ZESTRIL) 20 MG tablet TAKE ONE TABLET BY MOUTH TWICE DAILY   meclizine (ANTIVERT) 12.5 MG tablet Take 1 tablet by mouth daily.   metoprolol tartrate (LOPRESSOR) 25 MG tablet TAKE 1/2 TABLET BY MOUTH TWICE DAILY   nitroGLYCERIN (NITROSTAT) 0.4 MG SL tablet Place 1 tablet (0.4 mg total) under the tongue every 5 (five) minutes as needed for chest pain.   nystatin-triamcinolone (MYCOLOG II) cream APPLY TO AFFECTED AREA(s) TWICE DAILY   ofloxacin (FLOXIN) 0.3 % OTIC solution Place 5 drops into the left ear daily.   pantoprazole (PROTONIX) 40 MG tablet Take 1 tablet (40 mg total) by mouth 2 (two) times daily.   Polyethyl Glycol-Propyl Glycol (SYSTANE OP) Apply 1 drop to eye 5 (five) times daily as needed (dry eyes).   pregabalin (LYRICA) 75 MG capsule Take 1 capsule (75 mg total) by mouth 2 (two) times daily.   REPATHA SURECLICK 140 MG/ML SOAJ INJECT THE contents of ONE pen into THE SKIN ONCE every 14 DAYS   zolpidem (AMBIEN) 10 MG tablet TAKE ONE TABLET BY MOUTH EVERYDAY AT BEDTIME FOR INSOMNIA   No  facility-administered medications prior to visit.    Review of Systems  Constitutional: Negative.   HENT: Negative.    Eyes: Negative.   Respiratory: Negative.    Cardiovascular: Negative.   Gastrointestinal: Negative.   Genitourinary: Negative.   Musculoskeletal: Negative.   Skin: Negative.   Neurological: Negative.   Endo/Heme/Allergies: Negative.   Psychiatric/Behavioral: Negative.    All other systems reviewed and are negative.      Objective:   BP (!) 140/60   Pulse (!) 58   Ht 6' (1.829 m)   Wt 207 lb 12.8 oz (94.3 kg)   SpO2 93%   BMI 28.18 kg/m   Vitals:   06/24/23 1059  BP: (!) 140/60  Pulse: (!) 58  Height: 6' (1.829 m)  Weight: 207 lb 12.8 oz (94.3 kg)  SpO2: 93%  BMI (Calculated): 28.18    Physical Exam Nursing note reviewed.  Constitutional:      Appearance: Normal appearance. He is normal weight.  HENT:     Head: Normocephalic and atraumatic.     Nose: Nose normal.     Mouth/Throat:     Mouth: Mucous membranes are moist.     Pharynx: Oropharynx is clear.  Eyes:     Extraocular Movements: Extraocular movements intact.     Conjunctiva/sclera: Conjunctivae normal.     Pupils: Pupils are equal, round, and reactive to light.  Cardiovascular:     Rate and Rhythm: Normal rate and regular rhythm.     Pulses: Normal pulses.     Heart sounds: Normal heart sounds.  Pulmonary:     Effort: Pulmonary effort is normal.     Breath sounds: Normal breath sounds.  Abdominal:     General: Abdomen is flat. Bowel sounds are normal.     Palpations: Abdomen is soft.  Musculoskeletal:        General: Normal range of motion.     Cervical back: Normal range of motion.  Skin:    General: Skin is warm and dry.  Neurological:     General: No focal deficit present.     Mental Status: He is alert and oriented  to person, place, and time.  Psychiatric:        Mood and Affect: Mood normal.        Behavior: Behavior normal.        Thought Content: Thought content  normal.        Judgment: Judgment normal.      No results found for any visits on 06/24/23.  Recent Results (from the past 2160 hour(s))  Surgical pathology     Status: None   Collection Time: 03/27/23  8:20 AM  Result Value Ref Range   SURGICAL PATHOLOGY      SURGICAL PATHOLOGY CASE: [id number] PATIENT: [name] [name] Surgical Pathology Report     Clinical History: Change in bowel habits (crm)     FINAL MICROSCOPIC DIAGNOSIS:  A. COLON, ASCENDING, CECUM, POLYPECTOMY: Tubular adenoma (s) without high grade dysplasia.  B. COLON, TRANSVERSE, POLYPECTOMY: Tubular adenoma without high grade dysplasia.    GROSS DESCRIPTION:  A.  Received in formalin is a 1.5 x 1.4 x 0.2 cm aggregate of tan soft tissue as well as digestive material.  The specimen is entirely submitted in 1 block.  B.  Received in formalin is a tan, soft tissue fragment that is submitted in toto.  Size: 1.5 x 0.5 x 0.2 cm, 1 block submitted. Renette Butters, 03/27/2023)    Final Diagnosis performed by Jimmy Picket, MD.   Electronically signed 03/30/2023 Technical component performed at [facility], [address] [place], [place] [address].  Professional component performed at [employer]. [facility], [address], [place], [place] [address].  Immunohistochemistry Technical component (if applicable) was performed at [facility]. [address], [address], [place], [place] [address].   IMMUNOHISTOCHEMISTRY DISCLAIMER (if applicable): Some of these immunohistochemical stains may have been developed and the performance characteristics determine by [facility]. Some may not have been cleared or approved by the U.S. Food and Drug Administration. The FDA has determined that such clearance or approval is not necessary. This test is used for clinical purposes. It should not be regarded as investigational or for research. This laboratory is certified under  the Clinical Laboratory Improvement Amendments of 1988 (CLIA-88) as qualified to perform high complexity clinical laboratory testing.  The controls stained appropriately.   IHC stains are performed on formalin fixed, paraffin embedded tissue using a 3,3"diaminobenzidine (DAB) chromogen and Leica Bond Autostainer System. T he staining intensity of the nucleus is score manually and is reported as the percentage of tumor cell nuclei demonstrating specific nuclear staining. The specimens are fixed in 10% Neutral Formalin for at least 6 hours and up to 72hrs. These tests are validated on decalcified tissue. Results should be interpreted with caution given the possibility of false negative results on decalcified specimens. Antibody Clones are as follows ER-clone 37F, PR-clone 16, Ki67- clone MM1. Some of these immunohistochemical stains may have been developed and the performance characteristics determined by [facility].   Lipid panel     Status: None   Collection Time: 06/10/23 10:24 AM  Result Value Ref Range   Cholesterol, Total 117 100 - 199 mg/dL   Triglycerides 81 0 - 149 mg/dL   HDL 48 >96 mg/dL   VLDL Cholesterol Cal 16 5 - 40 mg/dL   LDL Chol Calc (NIH) 53 0 - 99 mg/dL   Chol/HDL Ratio 2.4 0.0 - 5.0 ratio    Comment:  T. Chol/HDL Ratio                                             Men  Women                               1/2 Avg.Risk  3.4    3.3                                   Avg.Risk  5.0    4.4                                2X Avg.Risk  9.6    7.1                                3X Avg.Risk 23.4   11.0   Hemoglobin A1c     Status: Abnormal   Collection Time: 06/10/23 10:24 AM  Result Value Ref Range   Hgb A1c MFr Bld 5.9 (H) 4.8 - 5.6 %    Comment:          Prediabetes: 5.7 - 6.4          Diabetes: >6.4          Glycemic control for adults with diabetes: <7.0    Est. average glucose Bld gHb Est-mCnc 123 mg/dL  CBC with  Differential/Platelet     Status: None   Collection Time: 06/10/23 10:24 AM  Result Value Ref Range   WBC 4.5 3.4 - 10.8 x10E3/uL    Comment: **Effective June 15, 2023 profile [id number] WBC will be made**   non-orderable as a stand-alone order code.    RBC 4.73 4.14 - 5.80 x10E6/uL   Hemoglobin 13.9 13.0 - 17.7 g/dL   Hematocrit 91.4 78.2 - 51.0 %   MCV 89 79 - 97 fL   MCH 29.4 26.6 - 33.0 pg   MCHC 33.2 31.5 - 35.7 g/dL   RDW 95.6 21.3 - 08.6 %   Platelets 187 150 - 450 x10E3/uL   Neutrophils 57 Not Estab. %   Lymphs 28 Not Estab. %   Monocytes 10 Not Estab. %   Eos 4 Not Estab. %   Basos 1 Not Estab. %   Neutrophils Absolute 2.6 1.4 - 7.0 x10E3/uL   Lymphocytes Absolute 1.3 0.7 - 3.1 x10E3/uL   Monocytes Absolute 0.5 0.1 - 0.9 x10E3/uL   EOS (ABSOLUTE) 0.2 0.0 - 0.4 x10E3/uL   Basophils Absolute 0.0 0.0 - 0.2 x10E3/uL   Immature Granulocytes 0 Not Estab. %   Immature Grans (Abs) 0.0 0.0 - 0.1 x10E3/uL  CMP14+EGFR     Status: Abnormal   Collection Time: 06/10/23 10:24 AM  Result Value Ref Range   Glucose 95 70 - 99 mg/dL   BUN 26 8 - 27 mg/dL   Creatinine, Ser 5.78 0.76 - 1.27 mg/dL   eGFR 56 (L) >46 NG/EXB/2.84   BUN/Creatinine Ratio 21 10 - 24   Sodium 141 134 - 144 mmol/L   Potassium 5.0 3.5 - 5.2 mmol/L   Chloride 105 96 - 106 mmol/L   CO2 23 20 -  29 mmol/L   Calcium 10.3 (H) 8.6 - 10.2 mg/dL   Total Protein 6.5 6.0 - 8.5 g/dL   Albumin 4.3 3.7 - 4.7 g/dL   Globulin, Total 2.2 1.5 - 4.5 g/dL   Bilirubin Total 0.4 0.0 - 1.2 mg/dL   Alkaline Phosphatase 42 (L) 44 - 121 IU/L   AST 11 0 - 40 IU/L   ALT 15 0 - 44 IU/L      Assessment & Plan:   Problem List Items Addressed This Visit       Cardiovascular and Mediastinum   Coronary artery disease involving coronary bypass graft of native heart with unstable angina pectoris (HCC) - Primary    Patient doing well. No complaints today. No further chest pain. Denies shortness of breath.       Essential  hypertension    Blood pressure well controlled with current medications.  Continue current therapy.         Other   Hyperlipidemia    Well controlled with current therapy. Continue current meds.        Return in about 4 months (around 10/23/2023) for with cards.   Total time spent: 25 minutes  [employer], NP  06/24/2023   This document may have been prepared by Dragon Voice Recognition software and as such may include unintentional dictation errors.

## 2023-06-25 ENCOUNTER — Other Ambulatory Visit: Payer: Self-pay | Admitting: Cardiology

## 2023-06-26 ENCOUNTER — Other Ambulatory Visit: Payer: Self-pay | Admitting: Family

## 2023-06-29 ENCOUNTER — Ambulatory Visit (INDEPENDENT_AMBULATORY_CARE_PROVIDER_SITE_OTHER): Payer: Medicare HMO | Admitting: Family

## 2023-06-29 ENCOUNTER — Encounter: Payer: Self-pay | Admitting: Family

## 2023-06-29 VITALS — BP 140/60 | HR 52 | Ht 72.0 in | Wt 208.0 lb

## 2023-06-29 DIAGNOSIS — E538 Deficiency of other specified B group vitamins: Secondary | ICD-10-CM | POA: Diagnosis not present

## 2023-06-29 DIAGNOSIS — Z0001 Encounter for general adult medical examination with abnormal findings: Secondary | ICD-10-CM | POA: Diagnosis not present

## 2023-06-29 DIAGNOSIS — Z0000 Encounter for general adult medical examination without abnormal findings: Secondary | ICD-10-CM

## 2023-06-29 MED ORDER — CYANOCOBALAMIN 1000 MCG/ML IJ SOLN
1000.0000 ug | Freq: Once | INTRAMUSCULAR | Status: AC
  Administered 2023-06-29: 1000 ug via INTRAMUSCULAR

## 2023-06-29 NOTE — Progress Notes (Signed)
Annual Wellness Visit  Patient: [name] [name], Male    DOB: 1936/01/07, 87 y.o.   MRN: [id number] Visit Date: 06/29/2023  Today's Provider: Miki Kins, FNP  Subjective:    Chief Complaint  Patient presents with   Annual Exam    AWV and B12   Aneil [name] is a 87 y.o. male who presents today for his Annual Wellness Visit.  Past Medical History:  Diagnosis Date   Arthritis    Benign neoplasm of colon 04/13/2008   Overview:  Benign Polyps Of The Large Intestine - colonoscopy 2006,dr Nance Pear of this note might be different from the original. Formatting of this note might be different from the original. Benign Polyps Of The Large Intestine - colonoscopy 2006,dr Nance Pear of this note might be different from the original. Overview: Benign Polyps Of The Large Intestine - colonoscopy 2006,dr    Blurring of visual image 05/28/2018   Cancer (HCC) 1995   prostate ca    Chest pain 07/27/2016   Chest pain, unspecified 07/27/2016   Coronary artery disease    Esophageal stricture 08/2016   GERD (gastroesophageal reflux disease)    Glaucoma    History of hiatal hernia    Hx of CABG 02/24/2018   Formatting of this note might be different from the original. Two-vessel bypass. Formatting of this note might be different from the original. Formatting of this note might be different from the original. Two-vessel bypass. Formatting of this note might be different from the original. Formatting of this note might be different from the original. Two-vessel bypass.   Hypertension    Malignant neoplasm of prostate (HCC) 04/23/2017   MI (mitral incompetence)    MI, acute, non ST segment elevation (HCC) 10/05/2012   Formatting of this note might be different from the original. Note: Unchanged - x4   Old myocardial infarction 05/01/2019   Note: Unchanged - x4   Orthopedic aftercare 10/08/2017   Peripheral neuropathy    Peripheral vascular disease (HCC)    S/P shoulder  replacement, left 09/25/2017   Skin sensation disturbance 06/23/2007   Formatting of this note might be different from the original. Tingling (Paresthesia) Formatting of this note might be different from the original. Formatting of this note might be different from the original. Tingling (Paresthesia)   Tear of meniscus of knee 11/11/2013   Formatting of this note might be different from the original. Formatting of this note might be different from the original. ICD-10 cut over Formatting of this note might be different from the original. Overview: ICD-10 cut over   Vertigo    Past Surgical History:  Procedure Laterality Date   COLONOSCOPY     COLONOSCOPY WITH PROPOFOL N/A 03/27/2023   Procedure: COLONOSCOPY WITH PROPOFOL;  Surgeon: Jeani Hawking, MD;  Location: [facility];  Service: Gastroenterology;  Laterality: N/A;   CORONARY ANGIOPLASTY WITH STENT PLACEMENT  2005   x9    CORONARY ARTERY BYPASS GRAFT  2011   CORONARY STENT PLACEMENT  07/2014   ESOPHAGOGASTRODUODENOSCOPY     ESOPHAGOGASTRODUODENOSCOPY (EGD) WITH ESOPHAGEAL DILATION  2018   knee scope Bilateral    LEG SURGERY Right 09/2018   LIPOMA EXCISION N/A 09/24/2016   Procedure: EXCISION SUBCUTANEOUS LIPOMA ON UPPER BACK;  Surgeon: Manus Rudd, MD;  Location: [facility] OR;  Service: General;  Laterality: N/A;   POLYPECTOMY  03/27/2023   Procedure: POLYPECTOMY;  Surgeon: Jeani Hawking, MD;  Location: [facility];  Service: Gastroenterology;;   Tomie China  1995   REVERSE SHOULDER ARTHROPLASTY Left 09/25/2017   REVERSE SHOULDER ARTHROPLASTY Left 09/25/2017   Procedure: LEFT REVERSE SHOULDER ARTHROPLASTY; deltoid repair;  Surgeon: Beverely Low, MD;  Location: [facility] OR;  Service: Orthopedics;  Laterality: Left;   ROTATOR CUFF REPAIR Left 2001   Family History  Problem Relation Age of Onset   Alzheimer's disease Mother    Stroke Father    Parkinson's disease Sister    Cancer Sister    Obesity Sister    Diabetes Mellitus II Sister     Social History   Socioeconomic History   Marital status: Divorced    Spouse name: Not on file   Number of children: 6   Years of education: Not on file   Highest education level: Not on file  Occupational History   Not on file  Tobacco Use   Smoking status: Never    Passive exposure: Never   Smokeless tobacco: Never  Vaping Use   Vaping status: Never Used  Substance and Sexual Activity   Alcohol use: Not Currently    Alcohol/week: 7.0 standard drinks of alcohol    Types: 7 Glasses of wine per week    Comment: wine with dinner   Drug use: No   Sexual activity: Not Currently  Other Topics Concern   Not on file  Social History Narrative   Not on file   Social Drivers of Health   Financial Resource Strain: Low Risk  (06/29/2023)   Overall Financial Resource Strain (CARDIA)    Difficulty of Paying Living Expenses: Not hard at all  Food Insecurity: No Food Insecurity (06/29/2023)   Hunger Vital Sign    Worried About Running Out of Food in the Last Year: Never true    Ran Out of Food in the Last Year: Never true  Transportation Needs: No Transportation Needs (06/29/2023)   PRAPARE - Administrator, Civil Service (Medical): No    Lack of Transportation (Non-Medical): No  Physical Activity: Sufficiently Active (06/29/2023)   Exercise Vital Sign    Days of Exercise per Week: 7 days    Minutes of Exercise per Session: 30 min  Stress: No Stress Concern Present (06/29/2023)   [employer] of Occupational Health - Occupational Stress Questionnaire    Feeling of Stress : Not at all  Social Connections: Socially Isolated (06/29/2023)   Social Connection and Isolation Panel [NHANES]    Frequency of Communication with Friends and Family: More than three times a week    Frequency of Social Gatherings with Friends and Family: Three times a week    Attends Religious Services: Never    Active Member of Clubs or Organizations: Not on file    Attends Club or  Organization Meetings: Never    Marital Status: Divorced  Intimate Partner Violence: Not At Risk (06/29/2023)   Humiliation, Afraid, Rape, and Kick questionnaire    Fear of Current or Ex-Partner: No    Emotionally Abused: No    Physically Abused: No    Sexually Abused: No    Medications: Outpatient Medications Prior to Visit  Medication Sig   acetaminophen (TYLENOL) 650 MG CR tablet Take 650 mg by mouth as needed for pain. 2 tablets in the morning and 2 tablets at bedtime   amLODipine (NORVASC) 10 MG tablet Take 1 tablet (10 mg total) by mouth daily.   aspirin EC 81 MG tablet Take 81 mg by mouth daily.   azelastine (OPTIVAR) 0.05 % ophthalmic solution USE ONE DROP IN  BOTH EYES TWICE DAILY   calcium carbonate (TUMS - DOSED IN MG ELEMENTAL CALCIUM) 500 MG chewable tablet Chew 2 tablets by mouth daily as needed for indigestion or heartburn.   celecoxib (CELEBREX) 200 MG capsule TAKE ONE CAPSULE BY MOUTH AT NOON   cholecalciferol (VITAMIN D) 1000 units tablet Take 1,000 Units by mouth daily.   cycloSPORINE (RESTASIS) 0.05 % ophthalmic emulsion Place 1 drop into both eyes 2 (two) times daily.   fenofibrate (TRICOR) 145 MG tablet Take 1 tablet (145 mg total) by mouth daily.   fluticasone (FLONASE) 50 MCG/ACT nasal spray Place 1-2 sprays into both nostrils as needed.   ibuprofen (ADVIL) 600 MG tablet Take by mouth.   latanoprost (XALATAN) 0.005 % ophthalmic solution PLACE 1 DROP INTO AFFECTED EYE ONCE EVERY EVENING FOR GLUCOMA   levocetirizine (XYZAL) 5 MG tablet    lisinopril (ZESTRIL) 20 MG tablet TAKE ONE TABLET BY MOUTH TWICE DAILY   meclizine (ANTIVERT) 12.5 MG tablet Take 1 tablet by mouth daily.   metoprolol tartrate (LOPRESSOR) 25 MG tablet TAKE 1/2 TABLET BY MOUTH TWICE DAILY   nitroGLYCERIN (NITROSTAT) 0.4 MG SL tablet Place 1 tablet (0.4 mg total) under the tongue every 5 (five) minutes as needed for chest pain.   nystatin-triamcinolone (MYCOLOG II) cream APPLY TO AFFECTED AREA(s)  TWICE DAILY   ofloxacin (FLOXIN) 0.3 % OTIC solution Place 5 drops into the left ear daily.   pantoprazole (PROTONIX) 40 MG tablet Take 1 tablet (40 mg total) by mouth 2 (two) times daily.   Polyethyl Glycol-Propyl Glycol (SYSTANE OP) Apply 1 drop to eye 5 (five) times daily as needed (dry eyes).   pregabalin (LYRICA) 75 MG capsule Take 1 capsule (75 mg total) by mouth 2 (two) times daily.   REPATHA SURECLICK 140 MG/ML SOAJ INJECT THE contents of ONE pen into THE SKIN ONCE every 14 DAYS   zolpidem (AMBIEN) 10 MG tablet TAKE ONE TABLET BY MOUTH EVERYDAY AT BEDTIME FOR INSOMNIA   No facility-administered medications prior to visit.    Allergies  Allergen Reactions   Docosahexaenoic Acid (Dha) (Fish) Diarrhea   Lovaza [Omega-3-Acid Ethyl Esters (Fish)] Diarrhea   Atorvastatin Other (See Comments) and Nausea Only    Joint pain   Hydrocodone Rash, Itching and Other (See Comments)   Ranolazine Diarrhea   Rosuvastatin Nausea Only and Other (See Comments)    Joint pain  Other reaction(s): Unknown  Joint pain    Joint pain  Other reaction(s): Unknown   Statins Other (See Comments)    Leg pain    Folic Acid     Note: anxiety itching and nausea   Folic Acid-Vit B6-Vit B12 Anxiety, Itching and Nausea Only   Pravastatin Nausea Only and Other (See Comments)    Patient Care Team: Miki Kins, FNP as PCP - General (Family Medicine) Kathrin Penner, RN (Inactive) as [facility] Care Management  Review of Systems  All other systems reviewed and are negative.    Objective:    Vitals: BP (!) 140/60   Pulse (!) 52   Ht 6' (1.829 m)   Wt 208 lb (94.3 kg)   SpO2 94%   BMI 28.21 kg/m   Physical Exam Vitals and nursing note reviewed.  Constitutional:      Appearance: Normal appearance. He is normal weight.  Eyes:     Conjunctiva/sclera: Conjunctivae normal.     Pupils: Pupils are equal, round, and reactive to light.  Cardiovascular:     Rate and  Rhythm: Normal  rate and regular rhythm.     Pulses: Normal pulses.     Heart sounds: Normal heart sounds.  Pulmonary:     Effort: Pulmonary effort is normal.     Breath sounds: Normal breath sounds.  Neurological:     General: No focal deficit present.     Mental Status: He is alert and oriented to person, place, and time.  Psychiatric:        Mood and Affect: Mood normal.        Behavior: Behavior normal.        Thought Content: Thought content normal.        Judgment: Judgment normal.      Most recent functional status assessment:    06/29/2023    1:13 PM  In your present state of health, do you have any difficulty performing the following activities:  Hearing? 0  Vision? 1  Difficulty concentrating or making decisions? 0  Walking or climbing stairs? 1  Dressing or bathing? 0  Doing errands, shopping? 0  Preparing Food and eating ? N  Using the Toilet? N  In the past six months, have you accidently leaked urine? N  Do you have problems with loss of bowel control? N  Managing your Medications? N  Managing your Finances? N  Housekeeping or managing your Housekeeping? N    Most recent fall risk assessment:    06/29/2023    1:24 PM  Fall Risk   Falls in the past year? 1  Number falls in past yr: 1  Injury with Fall? 1  Risk for fall due to : History of fall(s);Orthopedic patient;Impaired mobility;Impaired vision;Impaired balance/gait  Follow up Falls evaluation completed;Education provided;Falls prevention discussed     Most recent depression screenings:    06/29/2023    1:15 PM  PHQ 2/9 Scores  PHQ - 2 Score 0    Most recent cognitive screening:    06/29/2023    1:22 PM  6CIT Screen  What Year? 0 points  What month? 0 points  What time? 0 points  Count back from 20 0 points  Months in reverse 0 points  Repeat phrase 0 points  Total Score 0 points    No results found for any visits on 06/29/23.     Assessment & Plan:      Annual wellness visit done today  including the all of the following: Reviewed patient's Family Medical History Reviewed and updated list of patient's medical providers Assessment of cognitive impairment was done Assessed patient's functional ability Established a written schedule for health screening services Health Risk Assessent Completed and Reviewed  Exercise Activities and Dietary recommendations  Goals   None     Immunization History  Administered Date(s) Administered   Influenza Split 04/29/2011, 04/22/2013   Influenza, High Dose Seasonal PF 04/13/2014, 03/31/2018, 04/01/2023   Influenza,inj,Quad PF,6+ Mos 03/29/2020   Influenza,inj,quad, With Preservative 04/13/2017   Influenza,trivalent, recombinat, inj, PF 04/29/2011, 04/22/2013, 04/10/2017   Influenza-Unspecified 03/29/2020, 04/09/2021, 03/24/2022   Moderna Sars-Covid-2 Vaccination 06/10/2021   PFIZER(Purple Top)SARS-COV-2 Vaccination 08/13/2019, 09/03/2019   PNEUMOCOCCAL CONJUGATE-20 11/07/2021   Pneumococcal Conjugate-13 07/26/2013, 07/14/2014   Pneumococcal Polysaccharide-23 06/26/2005, 03/29/2019   Pneumococcal-Unspecified 07/14/2014   Respiratory Syncytial Virus Vaccine,Recomb Aduvanted(Arexvy) 03/24/2022   Td 06/26/2005, 03/24/2022   Tdap 07/31/2014   Zoster Recombinant(Shingrix) 06/17/2019, 10/06/2019   Zoster, Live 03/14/2008    Health Maintenance  Topic Date Due   COVID-19 Vaccine (4 - 2024-25 season) 03/15/2023   Medicare Annual Wellness (  AWV)  06/28/2024   DTaP/Tdap/Td (4 - Td or Tdap) 03/24/2032   Pneumonia Vaccine 63+ Years old  Completed   INFLUENZA VACCINE  Completed   Zoster Vaccines- Shingrix  Completed   HPV VACCINES  Aged Out     Discussed health benefits of physical activity, and encouraged him to engage in regular exercise appropriate for his age and condition.      Miki Kins, FNP   06/29/2023  This document may have been prepared by Dragon Voice Recognition software and as such may include unintentional  dictation errors.

## 2023-06-30 ENCOUNTER — Other Ambulatory Visit: Payer: Self-pay

## 2023-07-01 MED ORDER — NITROGLYCERIN 0.4 MG SL SUBL: 0.4000 mg | SUBLINGUAL_TABLET | SUBLINGUAL | 3 refills | Status: DC | PRN

## 2023-07-06 ENCOUNTER — Other Ambulatory Visit: Payer: Self-pay | Admitting: Family

## 2023-07-06 DIAGNOSIS — I10 Essential (primary) hypertension: Secondary | ICD-10-CM

## 2023-07-14 MED ORDER — AMLODIPINE BESYLATE 10 MG PO TABS: 10.0000 mg | ORAL_TABLET | Freq: Every day | ORAL | 3 refills | Status: DC

## 2023-07-22 ENCOUNTER — Other Ambulatory Visit: Payer: Self-pay | Admitting: Gastroenterology

## 2023-07-22 DIAGNOSIS — K862 Cyst of pancreas: Secondary | ICD-10-CM

## 2023-07-30 ENCOUNTER — Encounter: Payer: Self-pay | Admitting: Family

## 2023-07-30 ENCOUNTER — Ambulatory Visit: Payer: Medicare HMO | Admitting: Family

## 2023-07-30 DIAGNOSIS — E538 Deficiency of other specified B group vitamins: Secondary | ICD-10-CM

## 2023-07-30 MED ORDER — CYANOCOBALAMIN 1000 MCG/ML IJ SOLN
1000.0000 ug | Freq: Once | INTRAMUSCULAR | Status: AC
  Administered 2023-07-30: 1000 ug via INTRAMUSCULAR

## 2023-07-30 NOTE — Progress Notes (Signed)
   CHIEF COMPLAINT  B12 Shot     REASON FOR VISIT  B12 Injection     ASSESSMENT  B12 Deficiency, Unspecified     PLAN  Diagnoses and all orders for this visit:  Vitamin B12 deficiency -     cyanocobalamin (VITAMIN B12) injection 1,000 mcg     Pt. given B12 injection in clinic.  Return for next injection per provider instructions.   Total time spent: 5 minutes  Miki Kins, FNP  07/30/2023

## 2023-08-10 DIAGNOSIS — H353211 Exudative age-related macular degeneration, right eye, with active choroidal neovascularization: Secondary | ICD-10-CM | POA: Diagnosis not present

## 2023-08-10 DIAGNOSIS — H353122 Nonexudative age-related macular degeneration, left eye, intermediate dry stage: Secondary | ICD-10-CM | POA: Diagnosis not present

## 2023-08-10 DIAGNOSIS — H2513 Age-related nuclear cataract, bilateral: Secondary | ICD-10-CM | POA: Diagnosis not present

## 2023-08-10 DIAGNOSIS — H43813 Vitreous degeneration, bilateral: Secondary | ICD-10-CM | POA: Diagnosis not present

## 2023-08-13 ENCOUNTER — Other Ambulatory Visit: Payer: Self-pay | Admitting: Family

## 2023-08-13 DIAGNOSIS — H524 Presbyopia: Secondary | ICD-10-CM | POA: Diagnosis not present

## 2023-08-13 MED ORDER — LEVOCETIRIZINE DIHYDROCHLORIDE 5 MG PO TABS: 5.0000 mg | ORAL_TABLET | Freq: Every evening | ORAL | 1 refills | Status: DC

## 2023-08-14 ENCOUNTER — Other Ambulatory Visit: Payer: Medicare HMO

## 2023-08-18 DIAGNOSIS — H524 Presbyopia: Secondary | ICD-10-CM | POA: Diagnosis not present

## 2023-08-26 ENCOUNTER — Ambulatory Visit
Admission: RE | Admit: 2023-08-26 | Discharge: 2023-08-26 | Disposition: A | Discharge status: Home / Self Care | Payer: Medicare HMO | Source: Ambulatory Visit | Attending: Gastroenterology | Admitting: Gastroenterology

## 2023-08-26 DIAGNOSIS — K862 Cyst of pancreas: Secondary | ICD-10-CM | POA: Diagnosis not present

## 2023-08-26 MED ORDER — GADOPICLENOL 0.5 MMOL/ML IV SOLN
10.0000 mL | Freq: Once | INTRAVENOUS | Status: AC | PRN
  Administered 2023-08-26: 10 mL via INTRAVENOUS

## 2023-08-27 DIAGNOSIS — K862 Cyst of pancreas: Secondary | ICD-10-CM | POA: Diagnosis not present

## 2023-08-31 ENCOUNTER — Ambulatory Visit (INDEPENDENT_AMBULATORY_CARE_PROVIDER_SITE_OTHER): Payer: Medicare HMO | Admitting: Family

## 2023-08-31 DIAGNOSIS — E538 Deficiency of other specified B group vitamins: Secondary | ICD-10-CM | POA: Diagnosis not present

## 2023-08-31 MED ORDER — CYANOCOBALAMIN 1000 MCG/ML IJ SOLN
1000.0000 ug | Freq: Once | INTRAMUSCULAR | Status: AC
  Administered 2023-08-31: 1000 ug via INTRAMUSCULAR

## 2023-08-31 NOTE — Progress Notes (Signed)
   CHIEF COMPLAINT  B12 Shot     REASON FOR VISIT  B12 Injection     ASSESSMENT  B12 Deficiency, Unspecified     PLAN  Diagnoses and all orders for this visit:  Vitamin B12 deficiency -     cyanocobalamin (VITAMIN B12) injection 1,000 mcg     Pt. given B12 injection in clinic.  Return for next injection per provider instructions.   Total time spent: 5 minutes  Miki Kins, FNP  08/31/2023

## 2023-09-14 ENCOUNTER — Ambulatory Visit: Payer: Self-pay | Admitting: Urology

## 2023-09-17 DIAGNOSIS — Z96612 Presence of left artificial shoulder joint: Secondary | ICD-10-CM | POA: Diagnosis not present

## 2023-09-17 DIAGNOSIS — M25512 Pain in left shoulder: Secondary | ICD-10-CM | POA: Diagnosis not present

## 2023-09-17 DIAGNOSIS — M5412 Radiculopathy, cervical region: Secondary | ICD-10-CM | POA: Diagnosis not present

## 2023-09-21 ENCOUNTER — Other Ambulatory Visit: Payer: Self-pay | Admitting: Family

## 2023-09-21 DIAGNOSIS — H353211 Exudative age-related macular degeneration, right eye, with active choroidal neovascularization: Secondary | ICD-10-CM | POA: Diagnosis not present

## 2023-09-28 ENCOUNTER — Encounter: Payer: Self-pay | Admitting: Family

## 2023-09-28 ENCOUNTER — Other Ambulatory Visit: Payer: Self-pay | Admitting: Family

## 2023-09-28 ENCOUNTER — Other Ambulatory Visit: Payer: Self-pay | Admitting: Cardiovascular Disease

## 2023-09-28 ENCOUNTER — Ambulatory Visit (INDEPENDENT_AMBULATORY_CARE_PROVIDER_SITE_OTHER): Payer: Medicare HMO | Admitting: Family

## 2023-09-28 VITALS — BP 138/90 | HR 58 | Ht 72.0 in | Wt 207.0 lb

## 2023-09-28 DIAGNOSIS — R1030 Lower abdominal pain, unspecified: Secondary | ICD-10-CM | POA: Diagnosis not present

## 2023-09-28 DIAGNOSIS — I25700 Atherosclerosis of coronary artery bypass graft(s), unspecified, with unstable angina pectoris: Secondary | ICD-10-CM

## 2023-09-28 DIAGNOSIS — R7303 Prediabetes: Secondary | ICD-10-CM

## 2023-09-28 DIAGNOSIS — E559 Vitamin D deficiency, unspecified: Secondary | ICD-10-CM | POA: Diagnosis not present

## 2023-09-28 DIAGNOSIS — R350 Frequency of micturition: Secondary | ICD-10-CM | POA: Diagnosis not present

## 2023-09-28 DIAGNOSIS — E538 Deficiency of other specified B group vitamins: Secondary | ICD-10-CM

## 2023-09-28 DIAGNOSIS — R109 Unspecified abdominal pain: Secondary | ICD-10-CM

## 2023-09-28 DIAGNOSIS — I10 Essential (primary) hypertension: Secondary | ICD-10-CM

## 2023-09-28 DIAGNOSIS — Z6828 Body mass index (BMI) 28.0-28.9, adult: Secondary | ICD-10-CM

## 2023-09-28 DIAGNOSIS — E782 Mixed hyperlipidemia: Secondary | ICD-10-CM

## 2023-09-28 LAB — POCT URINALYSIS DIPSTICK
Bilirubin, UA: NEGATIVE
Blood, UA: NEGATIVE
Glucose, UA: NEGATIVE
Leukocytes, UA: NEGATIVE
Nitrite, UA: NEGATIVE
Protein, UA: POSITIVE — AB
Spec Grav, UA: 1.025 (ref 1.010–1.025)
Urobilinogen, UA: 0.2 U/dL
pH, UA: 5.5 (ref 5.0–8.0)

## 2023-09-28 MED ORDER — CYANOCOBALAMIN 1000 MCG/ML IJ SOLN
1000.0000 ug | Freq: Once | INTRAMUSCULAR | Status: AC
  Administered 2023-09-28: 1000 ug via INTRAMUSCULAR

## 2023-09-28 NOTE — Assessment & Plan Note (Signed)
 B12 injection given in office today.  Return for next injection per provider instructions.

## 2023-09-28 NOTE — Assessment & Plan Note (Signed)
 Blood pressure well controlled with current medications.  Continue current therapy.  Will reassess at follow up.

## 2023-09-28 NOTE — Assessment & Plan Note (Signed)
 Checking labs today.  Will continue supplements as needed.

## 2023-09-28 NOTE — Assessment & Plan Note (Signed)
 Checking labs today.  Continue current therapy for lipid control. Will modify as needed based on labwork results.

## 2023-09-28 NOTE — Progress Notes (Signed)
 Established Patient Office Visit  Subjective:  Patient ID: [name] [name], male    DOB: 09-Feb-1936  Age: 88 y.o. MRN: [id number]  Chief Complaint  Patient presents with   Follow-up    3 month follow up    Patient is here today for his 3 months follow up.  He has been feeling fairly well since last appointment.   He does have additional concerns to discuss today.  Has been having lower pelvic and flank pain.  Urinary leakage and burning.  These have been recurrent issues for him in the past, and he has been treated by urology for UTIs.   Labs are due today. He needs refills.   I have reviewed his active problem list, medication list, allergies, health maintenance, notes from last encounter, lab results for his appointment today.      No other concerns at this time.   Past Medical History:  Diagnosis Date   Arthritis    Benign neoplasm of colon 04/13/2008   Overview:  Benign Polyps Of The Large Intestine - colonoscopy 2006,dr Nance Pear of this note might be different from the original. Formatting of this note might be different from the original. Benign Polyps Of The Large Intestine - colonoscopy 2006,dr Nance Pear of this note might be different from the original. Overview: Benign Polyps Of The Large Intestine - colonoscopy 2006,dr    Blurring of visual image 05/28/2018   Cancer (HCC) 1995   prostate ca    Chest pain 07/27/2016   Chest pain, unspecified 07/27/2016   Coronary artery disease    Esophageal stricture 08/2016   GERD (gastroesophageal reflux disease)    Glaucoma    History of hiatal hernia    Hx of CABG 02/24/2018   Formatting of this note might be different from the original. Two-vessel bypass. Formatting of this note might be different from the original. Formatting of this note might be different from the original. Two-vessel bypass. Formatting of this note might be different from the original. Formatting of this note might be different  from the original. Two-vessel bypass.   Hypertension    Malignant neoplasm of prostate (HCC) 04/23/2017   MI (mitral incompetence)    MI, acute, non ST segment elevation (HCC) 10/05/2012   Formatting of this note might be different from the original. Note: Unchanged - x4   Old myocardial infarction 05/01/2019   Note: Unchanged - x4   Orthopedic aftercare 10/08/2017   Peripheral neuropathy    Peripheral vascular disease (HCC)    S/P shoulder replacement, left 09/25/2017   Skin sensation disturbance 06/23/2007   Formatting of this note might be different from the original. Tingling (Paresthesia) Formatting of this note might be different from the original. Formatting of this note might be different from the original. Tingling (Paresthesia)   Tear of meniscus of knee 11/11/2013   Formatting of this note might be different from the original. Formatting of this note might be different from the original. ICD-10 cut over Formatting of this note might be different from the original. Overview: ICD-10 cut over   Vertigo     Past Surgical History:  Procedure Laterality Date   COLONOSCOPY     COLONOSCOPY WITH PROPOFOL N/A 03/27/2023   Procedure: COLONOSCOPY WITH PROPOFOL;  Surgeon: Jeani Hawking, MD;  Location: [facility];  Service: Gastroenterology;  Laterality: N/A;   CORONARY ANGIOPLASTY WITH STENT PLACEMENT  2005   x9    CORONARY ARTERY BYPASS GRAFT  2011   CORONARY STENT  PLACEMENT  07/2014   ESOPHAGOGASTRODUODENOSCOPY     ESOPHAGOGASTRODUODENOSCOPY (EGD) WITH ESOPHAGEAL DILATION  2018   knee scope Bilateral    LEG SURGERY Right 09/2018   LIPOMA EXCISION N/A 09/24/2016   Procedure: EXCISION SUBCUTANEOUS LIPOMA ON UPPER BACK;  Surgeon: Manus Rudd, MD;  Location: [facility] OR;  Service: General;  Laterality: N/A;   POLYPECTOMY  03/27/2023   Procedure: POLYPECTOMY;  Surgeon: Jeani Hawking, MD;  Location: [facility];  Service: Gastroenterology;;   Prostectomy  1995   REVERSE SHOULDER  ARTHROPLASTY Left 09/25/2017   REVERSE SHOULDER ARTHROPLASTY Left 09/25/2017   Procedure: LEFT REVERSE SHOULDER ARTHROPLASTY; deltoid repair;  Surgeon: Beverely Low, MD;  Location: [facility] OR;  Service: Orthopedics;  Laterality: Left;   ROTATOR CUFF REPAIR Left 2001    Social History   Socioeconomic History   Marital status: Divorced    Spouse name: Not on file   Number of children: 6   Years of education: Not on file   Highest education level: Not on file  Occupational History   Not on file  Tobacco Use   Smoking status: Never    Passive exposure: Never   Smokeless tobacco: Never  Vaping Use   Vaping status: Never Used  Substance and Sexual Activity   Alcohol use: Not Currently    Alcohol/week: 7.0 standard drinks of alcohol    Types: 7 Glasses of wine per week    Comment: wine with dinner   Drug use: No   Sexual activity: Not Currently  Other Topics Concern   Not on file  Social History Narrative   Not on file   Social Drivers of Health   Financial Resource Strain: Low Risk  (06/29/2023)   Overall Financial Resource Strain (CARDIA)    Difficulty of Paying Living Expenses: Not hard at all  Food Insecurity: No Food Insecurity (06/29/2023)   Hunger Vital Sign    Worried About Running Out of Food in the Last Year: Never true    Ran Out of Food in the Last Year: Never true  Transportation Needs: No Transportation Needs (06/29/2023)   PRAPARE - Administrator, Civil Service (Medical): No    Lack of Transportation (Non-Medical): No  Physical Activity: Sufficiently Active (06/29/2023)   Exercise Vital Sign    Days of Exercise per Week: 7 days    Minutes of Exercise per Session: 30 min  Stress: No Stress Concern Present (06/29/2023)   [employer] of Occupational Health - Occupational Stress Questionnaire    Feeling of Stress : Not at all  Social Connections: Socially Isolated (06/29/2023)   Social Connection and Isolation Panel [NHANES]    Frequency of  Communication with Friends and Family: More than three times a week    Frequency of Social Gatherings with Friends and Family: Three times a week    Attends Religious Services: Never    Active Member of Clubs or Organizations: Not on file    Attends Club or Organization Meetings: Never    Marital Status: Divorced  Intimate Partner Violence: Not At Risk (06/29/2023)   Humiliation, Afraid, Rape, and Kick questionnaire    Fear of Current or Ex-Partner: No    Emotionally Abused: No    Physically Abused: No    Sexually Abused: No    Family History  Problem Relation Age of Onset   Alzheimer's disease Mother    Stroke Father    Parkinson's disease Sister    Cancer Sister    Obesity Sister  Diabetes Mellitus II Sister     Allergies  Allergen Reactions   Docosahexaenoic Acid (Dha) (Fish) Diarrhea   Lovaza [Omega-3-Acid Ethyl Esters (Fish)] Diarrhea   Atorvastatin Other (See Comments) and Nausea Only    Joint pain   Hydrocodone Rash, Itching and Other (See Comments)   Ranolazine Diarrhea   Rosuvastatin Nausea Only and Other (See Comments)    Joint pain  Other reaction(s): Unknown  Joint pain    Joint pain  Other reaction(s): Unknown   Statins Other (See Comments)    Leg pain    Folic Acid     Note: anxiety itching and nausea   Folic Acid-Vit B6-Vit B12 Anxiety, Itching and Nausea Only   Pravastatin Nausea Only and Other (See Comments)    Review of Systems  Gastrointestinal:  Positive for abdominal pain.  Genitourinary:  Positive for frequency and urgency.  All other systems reviewed and are negative.      Objective:   BP (!) 138/90   Pulse (!) 58   Ht 6' (1.829 m)   Wt 207 lb (93.9 kg)   SpO2 96%   BMI 28.07 kg/m   Vitals:   09/28/23 1053  BP: (!) 138/90  Pulse: (!) 58  Height: 6' (1.829 m)  Weight: 207 lb (93.9 kg)  SpO2: 96%  BMI (Calculated): 28.07    Physical Exam Vitals and nursing note reviewed.  Constitutional:      Appearance: Normal  appearance. He is normal weight.  HENT:     Head: Normocephalic and atraumatic.     Right Ear: Tympanic membrane normal.     Left Ear: Tympanic membrane normal.     Nose: Nose normal.  Eyes:     Extraocular Movements: Extraocular movements intact.     Conjunctiva/sclera: Conjunctivae normal.     Pupils: Pupils are equal, round, and reactive to light.  Cardiovascular:     Rate and Rhythm: Normal rate and regular rhythm.     Pulses: Normal pulses.     Heart sounds: Normal heart sounds.  Pulmonary:     Effort: Pulmonary effort is normal.     Breath sounds: Normal breath sounds.  Musculoskeletal:        General: Normal range of motion.     Cervical back: Normal range of motion.  Neurological:     General: No focal deficit present.     Mental Status: He is alert and oriented to person, place, and time. Mental status is at baseline.  Psychiatric:        Mood and Affect: Mood normal.        Behavior: Behavior normal.        Thought Content: Thought content normal.        Judgment: Judgment normal.      Results for orders placed or performed in visit on 09/28/23  POCT Urinalysis Dipstick (81002)  Result Value Ref Range   Color, UA Dark yellow    Clarity, UA Clear    Glucose, UA Negative Negative   Bilirubin, UA Negative    Ketones, UA Trace    Spec Grav, UA 1.025 1.010 - 1.025   Blood, UA Negative    pH, UA 5.5 5.0 - 8.0   Protein, UA Positive (A) Negative   Urobilinogen, UA 0.2 0.2 or 1.0 E.U./dL   Nitrite, UA Negative    Leukocytes, UA Negative Negative   Appearance Clear    Odor Yes     Recent Results (from the past 2160 hours)  POCT Urinalysis Dipstick (69485)     Status: Abnormal   Collection Time: 09/28/23 10:58 AM  Result Value Ref Range   Color, UA Dark yellow    Clarity, UA Clear    Glucose, UA Negative Negative   Bilirubin, UA Negative    Ketones, UA Trace    Spec Grav, UA 1.025 1.010 - 1.025   Blood, UA Negative    pH, UA 5.5 5.0 - 8.0   Protein, UA  Positive (A) Negative   Urobilinogen, UA 0.2 0.2 or 1.0 E.U./dL   Nitrite, UA Negative    Leukocytes, UA Negative Negative   Appearance Clear    Odor Yes        Assessment & Plan:   Problem List Items Addressed This Visit       Cardiovascular and Mediastinum   Essential hypertension   Blood pressure well controlled with current medications.  Continue current therapy.  Will reassess at follow up.        Relevant Orders   CMP14+EGFR   TSH   CBC with Diff     Other   Hyperlipidemia   Checking labs today.  Continue current therapy for lipid control. Will modify as needed based on labwork results.        Relevant Orders   Lipid panel   CMP14+EGFR   TSH   CBC with Diff   Vitamin D deficiency   Checking labs today.  Will continue supplements as needed.        Relevant Orders   VITAMIN D 25 Hydroxy (Vit-D Deficiency, Fractures)   CMP14+EGFR   TSH   CBC with Diff   Vitamin B12 deficiency   B12 injection given in office today.  Return for next injection per provider instructions.       Relevant Orders   CMP14+EGFR   TSH   Vitamin B12   CBC with Diff   Other Visit Diagnoses       Urinary frequency    -  Primary   UA in office today abnormal. Sending for UA and culture at [facility]   Relevant Orders   POCT Urinalysis Dipstick (81002) (Completed)   Urinalysis, Routine w reflex microscopic   Urine Culture   CMP14+EGFR   TSH   CBC with Diff     Flank pain       Relevant Orders   Urinalysis, Routine w reflex microscopic   Urine Culture   CMP14+EGFR   TSH   CBC with Diff     Body mass index 28.0-28.9, adult       Relevant Orders   CMP14+EGFR   TSH   CBC with Diff     Lower abdominal pain       Relevant Orders   CRP (C-Reactive Protein)   Sed Rate (ESR)   CMP14+EGFR   TSH   CBC with Diff     Prediabetes       A1C is in prediabetic ranges. Patient counseled on dietary choices and verbalized understanding. Will reassess at follow up after next  lab check.   Relevant Orders   CMP14+EGFR   TSH   Hemoglobin A1c   CBC with Diff       Return in about 3 months (around 12/29/2023).   Total time spent: 30 minutes  Miki Kins, FNP  09/28/2023   This document may have been prepared by [facility] Voice Recognition software and as such may include unintentional dictation errors.

## 2023-09-29 LAB — CMP14+EGFR
ALT: 10 IU/L (ref 0–44)
AST: 9 IU/L (ref 0–40)
Albumin: 3.9 g/dL (ref 3.7–4.7)
Alkaline Phosphatase: 41 IU/L — ABNORMAL LOW (ref 44–121)
BUN/Creatinine Ratio: 33 — ABNORMAL HIGH (ref 10–24)
BUN: 45 mg/dL — ABNORMAL HIGH (ref 8–27)
Bilirubin Total: 0.2 mg/dL (ref 0.0–1.2)
CO2: 19 mmol/L — ABNORMAL LOW (ref 20–29)
Calcium: 9.2 mg/dL (ref 8.6–10.2)
Chloride: 108 mmol/L — ABNORMAL HIGH (ref 96–106)
Creatinine, Ser: 1.35 mg/dL — ABNORMAL HIGH (ref 0.76–1.27)
Globulin, Total: 1.9 g/dL (ref 1.5–4.5)
Glucose: 129 mg/dL — ABNORMAL HIGH (ref 70–99)
Potassium: 4.6 mmol/L (ref 3.5–5.2)
Sodium: 141 mmol/L (ref 134–144)
Total Protein: 5.8 g/dL — ABNORMAL LOW (ref 6.0–8.5)
eGFR: 51 mL/min/{1.73_m2} — ABNORMAL LOW (ref >59)

## 2023-09-29 LAB — HEMOGLOBIN A1C
Est. average glucose Bld gHb Est-mCnc: 128 mg/dL
Hgb A1c MFr Bld: 6.1 % — ABNORMAL HIGH (ref 4.8–5.6)

## 2023-09-29 LAB — URINALYSIS, ROUTINE W REFLEX MICROSCOPIC
Bilirubin, UA: NEGATIVE
Glucose, UA: NEGATIVE
Leukocytes,UA: NEGATIVE
Nitrite, UA: NEGATIVE
RBC, UA: NEGATIVE
Specific Gravity, UA: >=1.03 — AB (ref 1.005–1.030)
Urobilinogen, Ur: 1 mg/dL (ref 0.2–1.0)
pH, UA: 5.5 (ref 5.0–7.5)

## 2023-09-29 LAB — CBC WITH DIFFERENTIAL/PLATELET
Basophils Absolute: 0 10*3/uL (ref 0.0–0.2)
Basos: 0 %
EOS (ABSOLUTE): 0.1 10*3/uL (ref 0.0–0.4)
Eos: 1 %
Hematocrit: 42.5 % (ref 37.5–51.0)
Hemoglobin: 14.1 g/dL (ref 13.0–17.7)
Immature Grans (Abs): 0.1 10*3/uL (ref 0.0–0.1)
Immature Granulocytes: 1 %
Lymphocytes Absolute: 1.2 10*3/uL (ref 0.7–3.1)
Lymphs: 13 %
MCH: 29.9 pg (ref 26.6–33.0)
MCHC: 33.2 g/dL (ref 31.5–35.7)
MCV: 90 fL (ref 79–97)
Monocytes Absolute: 0.6 10*3/uL (ref 0.1–0.9)
Monocytes: 7 %
Neutrophils Absolute: 6.8 10*3/uL (ref 1.4–7.0)
Neutrophils: 78 %
Platelets: 195 10*3/uL (ref 150–450)
RBC: 4.71 x10E6/uL (ref 4.14–5.80)
RDW: 12.6 % (ref 11.6–15.4)
WBC: 8.7 10*3/uL (ref 3.4–10.8)

## 2023-09-29 LAB — LIPID PANEL
Chol/HDL Ratio: 2.1 ratio (ref 0.0–5.0)
Cholesterol, Total: 103 mg/dL (ref 100–199)
HDL: 49 mg/dL (ref >39)
LDL Chol Calc (NIH): 33 mg/dL (ref 0–99)
Triglycerides: 113 mg/dL (ref 0–149)
VLDL Cholesterol Cal: 21 mg/dL (ref 5–40)

## 2023-09-29 LAB — SEDIMENTATION RATE: Sed Rate: 3 mm/h (ref 0–30)

## 2023-09-29 LAB — VITAMIN B12: Vitamin B-12: >2000 pg/mL — ABNORMAL HIGH (ref 232–1245)

## 2023-09-29 LAB — C-REACTIVE PROTEIN: CRP: <1 mg/L (ref 0–10)

## 2023-09-29 LAB — VITAMIN D 25 HYDROXY (VIT D DEFICIENCY, FRACTURES): Vit D, 25-Hydroxy: 80.1 ng/mL (ref 30.0–100.0)

## 2023-09-29 LAB — TSH: TSH: 0.448 u[IU]/mL — ABNORMAL LOW (ref 0.450–4.500)

## 2023-09-30 LAB — URINE CULTURE

## 2023-10-01 ENCOUNTER — Telehealth: Payer: Self-pay | Admitting: Family

## 2023-10-01 NOTE — Telephone Encounter (Signed)
 Patient left VM requesting his lab results from his bloodwork and urine. Would like the results today. Please advise.

## 2023-10-02 ENCOUNTER — Telehealth: Payer: Self-pay | Admitting: *Deleted

## 2023-10-02 MED ORDER — CIPROFLOXACIN HCL 500 MG PO TABS
500.0000 mg | ORAL_TABLET | Freq: Every day | ORAL | 0 refills | Status: AC
Start: 2023-10-02 — End: 2023-10-09

## 2023-10-02 NOTE — Telephone Encounter (Signed)
 Pt called requesting  large knee high stocking in black open toe. He would like 6 pairs. Will notify sonja. He is aware we will have to order them.

## 2023-10-04 NOTE — Progress Notes (Unsigned)
 10/05/23 3:27 PM   Ward Manus Gunning 12/29/1935 [id number]  Referring provider:  Miki Kins, FNP [address] [place],  [place] [address]  Urological history Prostate cancer  -PSA pending -s/p perineal prostatectomy in 1995  2. Urinary incontinence, male stress incontinence - Referred to physical therapy  - Managed with incontinence clamp on active days/condom catheters  HPI: [name] [name] is a 88 y.o.male who presents today for yearly visit.    Previous records reviewed.   He had abdominal MRI for pancreatic cyst in February and no abnormalities were noted within the kidneys.  He was diagnosed with a Proteus UTI on September 28, 2023 and treated with culture appropriate antibiotics.  He states he has been on Cipro for 3 days and he has not gotten any better.  His symptoms started 6 to 8 weeks ago with left groin pain.  He attributed to a possible muscle strain but then for the last 3 to 4 weeks the pain has been radiating up into the left flank.  1 week ago he started experiencing dysuria.  He cannot lay on his left side or his back.  The pain is also exasperated by rising to a standing position.  He continues to have baseline urinary frequency of every 2 hours and getting up 3-4 times a night to urinate.  Patient denies any modifying or aggravating factors.  Patient denies any recent UTI's or gross hematuria.   Patient denies any fevers, chills, nausea or vomiting.    PVR 5 mL  PMH: Past Medical History:  Diagnosis Date   Arthritis    Benign neoplasm of colon 04/13/2008   Overview:  Benign Polyps Of The Large Intestine - colonoscopy 2006,dr Nance Pear of this note might be different from the original. Formatting of this note might be different from the original. Benign Polyps Of The Large Intestine - colonoscopy 2006,dr Nance Pear of this note might be different from the original. Overview: Benign Polyps Of The Large Intestine - colonoscopy 2006,dr     Blurring of visual image 05/28/2018   Cancer (HCC) 1995   prostate ca    Chest pain 07/27/2016   Chest pain, unspecified 07/27/2016   Coronary artery disease    Esophageal stricture 08/2016   GERD (gastroesophageal reflux disease)    Glaucoma    History of hiatal hernia    Hx of CABG 02/24/2018   Formatting of this note might be different from the original. Two-vessel bypass. Formatting of this note might be different from the original. Formatting of this note might be different from the original. Two-vessel bypass. Formatting of this note might be different from the original. Formatting of this note might be different from the original. Two-vessel bypass.   Hypertension    Malignant neoplasm of prostate (HCC) 04/23/2017   MI (mitral incompetence)    MI, acute, non ST segment elevation (HCC) 10/05/2012   Formatting of this note might be different from the original. Note: Unchanged - x4   Old myocardial infarction 05/01/2019   Note: Unchanged - x4   Orthopedic aftercare 10/08/2017   Peripheral neuropathy    Peripheral vascular disease (HCC)    S/P shoulder replacement, left 09/25/2017   Skin sensation disturbance 06/23/2007   Formatting of this note might be different from the original. Tingling (Paresthesia) Formatting of this note might be different from the original. Formatting of this note might be different from the original. Tingling (Paresthesia)   Tear of meniscus of knee 11/11/2013  Formatting of this note might be different from the original. Formatting of this note might be different from the original. ICD-10 cut over Formatting of this note might be different from the original. Overview: ICD-10 cut over   Vertigo     Surgical History: Past Surgical History:  Procedure Laterality Date   COLONOSCOPY     COLONOSCOPY WITH PROPOFOL N/A 03/27/2023   Procedure: COLONOSCOPY WITH PROPOFOL;  Surgeon: Jeani Hawking, MD;  Location: [facility];  Service: Gastroenterology;   Laterality: N/A;   CORONARY ANGIOPLASTY WITH STENT PLACEMENT  2005   x9    CORONARY ARTERY BYPASS GRAFT  2011   CORONARY STENT PLACEMENT  07/2014   ESOPHAGOGASTRODUODENOSCOPY     ESOPHAGOGASTRODUODENOSCOPY (EGD) WITH ESOPHAGEAL DILATION  2018   knee scope Bilateral    LEG SURGERY Right 09/2018   LIPOMA EXCISION N/A 09/24/2016   Procedure: EXCISION SUBCUTANEOUS LIPOMA ON UPPER BACK;  Surgeon: Manus Rudd, MD;  Location: [facility] OR;  Service: General;  Laterality: N/A;   POLYPECTOMY  03/27/2023   Procedure: POLYPECTOMY;  Surgeon: Jeani Hawking, MD;  Location: [facility];  Service: Gastroenterology;;   Prostectomy  1995   REVERSE SHOULDER ARTHROPLASTY Left 09/25/2017   REVERSE SHOULDER ARTHROPLASTY Left 09/25/2017   Procedure: LEFT REVERSE SHOULDER ARTHROPLASTY; deltoid repair;  Surgeon: Beverely Low, MD;  Location: [facility] OR;  Service: Orthopedics;  Laterality: Left;   ROTATOR CUFF REPAIR Left 2001    Home Medications:  Allergies as of 10/05/2023       Reactions   Docosahexaenoic Acid (dha) (fish) Diarrhea   Lovaza [omega-3-acid Ethyl Esters (fish)] Diarrhea   Atorvastatin Other (See Comments), Nausea Only   Joint pain   Hydrocodone Rash, Itching, Other (See Comments)   Ranolazine Diarrhea   Rosuvastatin Nausea Only, Other (See Comments)   Joint pain Other reaction(s): Unknown Joint pain    Joint pain  Other reaction(s): Unknown   Statins Other (See Comments)   Leg pain    Folic Acid    Note: anxiety itching and nausea   Folic Acid-vit B6-vit B12 Anxiety, Itching, Nausea Only   folic acid / vitamin B12 / vitamin B6   Pravastatin Nausea Only, Other (See Comments)        Medication List        Accurate as of October 05, 2023  3:27 PM. If you have any questions, ask your nurse or doctor.          acetaminophen 650 MG CR tablet Commonly known as: TYLENOL Take 650 mg by mouth as needed for pain. 2 tablets in the morning and 2 tablets at bedtime   amLODipine 10 MG  tablet Commonly known as: NORVASC Take 1 tablet (10 mg total) by mouth daily.   aspirin EC 81 MG tablet Take 81 mg by mouth daily.   azelastine 0.05 % ophthalmic solution Commonly known as: OPTIVAR USE ONE DROP IN BOTH EYES TWICE DAILY   calcium carbonate 500 MG chewable tablet Commonly known as: TUMS - dosed in mg elemental calcium Chew 2 tablets by mouth daily as needed for indigestion or heartburn.   cefUROXime 250 MG tablet Commonly known as: CEFTIN Take 1 tablet (250 mg total) by mouth 2 (two) times daily with a meal for 5 days.   celecoxib 200 MG capsule Commonly known as: CELEBREX TAKE 1 CAPSULE BY MOUTH ONCE DAILY AT NOON   cholecalciferol 1000 units tablet Commonly known as: VITAMIN D Take 1,000 Units by mouth daily.   ciprofloxacin 500 MG tablet Commonly  known as: Cipro Take 1 tablet (500 mg total) by mouth daily with breakfast for 7 days.   cycloSPORINE 0.05 % ophthalmic emulsion Commonly known as: RESTASIS Place 1 drop into both eyes 2 (two) times daily.   fenofibrate 145 MG tablet Commonly known as: TRICOR TAKE 1 TABLET BY MOUTH ONCE EVERY EVENING   fluticasone 50 MCG/ACT nasal spray Commonly known as: FLONASE Place 1-2 sprays into both nostrils as needed.   ibuprofen 600 MG tablet Commonly known as: ADVIL Take by mouth.   latanoprost 0.005 % ophthalmic solution Commonly known as: XALATAN PLACE 1 DROP INTO AFFECTED EYE ONCE EVERY EVENING FOR GLUCOMA   levocetirizine 5 MG tablet Commonly known as: XYZAL Take 1 tablet (5 mg total) by mouth every evening.   lisinopril 20 MG tablet Commonly known as: ZESTRIL TAKE ONE TABLET BY MOUTH TWICE DAILY   meclizine 12.5 MG tablet Commonly known as: ANTIVERT Take 1 tablet by mouth daily.   metoprolol tartrate 25 MG tablet Commonly known as: LOPRESSOR TAKE 1/2 TABLET BY MOUTH TWICE DAILY   nitroGLYCERIN 0.4 MG SL tablet Commonly known as: NITROSTAT Place 1 tablet (0.4 mg total) under the tongue every  5 (five) minutes as needed for chest pain.   nystatin-triamcinolone cream Commonly known as: MYCOLOG II APPLY TO AFFECTED AREA(s) TWICE DAILY   ofloxacin 0.3 % OTIC solution Commonly known as: FLOXIN Place 5 drops into the left ear daily.   pantoprazole 40 MG tablet Commonly known as: PROTONIX TAKE 1 TABLET BY MOUTH TWICE DAILY   pregabalin 75 MG capsule Commonly known as: Lyrica Take 1 capsule (75 mg total) by mouth 2 (two) times daily.   Repatha SureClick 140 MG/ML Soaj Generic drug: Evolocumab INJECT THE contents of ONE pen into THE SKIN ONCE every 14 DAYS   SYSTANE OP Apply 1 drop to eye 5 (five) times daily as needed (dry eyes).   zolpidem 10 MG tablet Commonly known as: AMBIEN TAKE ONE TABLET BY MOUTH EVERYDAY AT BEDTIME FOR INSOMNIA        Allergies:  Allergies  Allergen Reactions   Docosahexaenoic Acid (Dha) (Fish) Diarrhea   Lovaza [Omega-3-Acid Ethyl Esters (Fish)] Diarrhea   Atorvastatin Other (See Comments) and Nausea Only    Joint pain   Hydrocodone Rash, Itching and Other (See Comments)   Ranolazine Diarrhea   Rosuvastatin Nausea Only and Other (See Comments)    Joint pain  Other reaction(s): Unknown  Joint pain    Joint pain  Other reaction(s): Unknown   Statins Other (See Comments)    Leg pain    Folic Acid     Note: anxiety itching and nausea   Folic Acid-Vit B6-Vit B12 Anxiety, Itching and Nausea Only    folic acid / vitamin B12 / vitamin B6   Pravastatin Nausea Only and Other (See Comments)    Family History: Family History  Problem Relation Age of Onset   Alzheimer's disease Mother    Stroke Father    Parkinson's disease Sister    Cancer Sister    Obesity Sister    Diabetes Mellitus II Sister     Social History:  reports that he has never smoked. He has never been exposed to tobacco smoke. He has never used smokeless tobacco. He reports that he does not currently use alcohol after a past usage of about 7.0 standard drinks of  alcohol per week. He reports that he does not use drugs.   Physical Exam: BP 127/66 (BP Location: Left Arm, Patient  Position: Sitting, Cuff Size: Normal)   Pulse 60   Ht 6' (1.829 m)   Wt 206 lb 6.4 oz (93.6 kg)   SpO2 96%   BMI 27.99 kg/m   Constitutional:  Well nourished. Alert and oriented, No acute distress. HEENT: [place] AT, moist mucus membranes.  Trachea midline Cardiovascular: No clubbing, cyanosis, or edema. Respiratory: Normal respiratory effort, no increased work of breathing. Neurologic: Grossly intact, no focal deficits, moving all 4 extremities. Psychiatric: Normal mood and affect.   Laboratory Data: Component     Latest Ref Rng 09/28/2023  Color, Urine     YELLOW    Appearance Clear   Specific Gravity, Urine     1.005 - 1.030    pH     5.0 - 8.0    Glucose, UA     Negative  Negative   Hgb urine dipstick     NEGATIVE    Bilirubin Urine     NEGATIVE    Ketones, ur     NEGATIVE mg/dL   Protein     NEGATIVE mg/dL   Nitrite     NEGATIVE    Leukocytes,Ua     NEGATIVE    Squamous Epithelial / HPF     0 - 5 /HPF   WBC, UA     0 - 5 WBC/hpf   RBC / HPF     0 - 5 RBC/hpf   Bacteria, UA     NONE SEEN    Mucus   Specific Gravity, UA     1.005 - 1.030  >=1.030 !   Specific Gravity, UA      1.025   pH, UA     5.0 - 7.5  5.5   pH, UA      5.5   Color, UA     Yellow  Yellow   Color, UA      Dark yellow   Appearance Ur     Clear  Clear   Leukocytes,UA     Negative  Negative   Leukocytes,UA      Negative   Protein,UA     Negative/Trace  Trace   Protein,UA      Positive !   Ketones, UA     Negative  Trace !   Ketones, UA      Trace   RBC, UA     Negative  Negative   RBC, UA      Negative   Bilirubin, UA     Negative  Negative   Bilirubin, UA      Negative   Urobilinogen, Ur     0.2 - 1.0 mg/dL 1.0   Nitrite, UA     Negative  Negative   Nitrite, UA      Negative   Microscopic Examination Comment   Specimen Source   Amorphous Crystal    Clarity, UA Clear   Glucose     Negative  Negative   Urobilinogen, UA     0.2 or 1.0 E.U./dL 0.2   Odor Yes     Legend: ! Abnormal   09/28/2023  Organism ID, Bacteria Proteus mirabilis !   Urine Culture, Routine Final report !   Antimicrobial Susceptibility Comment     Legend: ! Abnormal  Component     Latest Ref Rng 09/28/2023  WBC     3.4 - 10.8 x10E3/uL 8.7   RBC     4.14 - 5.80 x10E6/uL 4.71   Hemoglobin     13.0 -  17.7 g/dL 16.1   HCT     09.6 - 04.5 % 42.5   MCV     79 - 97 fL 90   MCH     26.6 - 33.0 pg 29.9   MCHC     31.5 - 35.7 g/dL 40.9   RDW     81.1 - 91.4 % 12.6   Platelets     150 - 450 x10E3/uL 195   nRBC     0.0 - 0.2 %   Neutrophils     Not Estab. % 78   NEUT#     1.4 - 7.0 x10E3/uL 6.8   Lymphocytes     %   Lymphs Abs     0.7 - 3.1 x10E3/uL 1.2   Monocytes Relative     %   Monocyte #     0.1 - 1.0 K/uL   Eosinophil     %   Eosinophils Absolute     0.0 - 0.5 K/uL   Basophil     %   Basophils Absolute     0.0 - 0.2 x10E3/uL 0.0   Immature Granulocytes     Not Estab. % 1   Abs Immature Granulocytes     0.00 - 0.07 K/uL   Lymphs     Not Estab. % 13   Monocytes     Not Estab. % 7   Eos     Not Estab. % 1   Basos     Not Estab. % 0   Monocytes Absolute     0.1 - 0.9 x10E3/uL 0.6   EOS (ABSOLUTE)     0.0 - 0.4 x10E3/uL 0.1   Immature Grans (Abs)     0.0 - 0.1 x10E3/uL 0.1     Component     Latest Ref Rng 09/28/2023  Hemoglobin A1C     4.8 - 5.6 % 6.1 (H)   Est. average glucose Bld gHb Est-mCnc     mg/dL 782     Legend: (H) High  Component     Latest Ref Rng 09/28/2023  Sodium     134 - 144 mmol/L 141   Potassium     3.5 - 5.2 mmol/L 4.6   Chloride     96 - 106 mmol/L 108 (H)   CO2     20 - 29 mmol/L 19 (L)   Glucose     70 - 99 mg/dL 956 (H)   BUN     8 - 27 mg/dL 45 (H)   Creatinine     0.76 - 1.27 mg/dL 2.13 (H)   Calcium     8.6 - 10.2 mg/dL 9.2   Total Protein     6.0 - 8.5 g/dL 5.8 (L)    Albumin     3.7 - 4.7 g/dL 3.9   AST     0 - 40 IU/L 9   ALT     0 - 44 IU/L 10   Alkaline Phosphatase     44 - 121 IU/L 41 (L)   Total Bilirubin     0.0 - 1.2 mg/dL 0.2   BUN/Creatinine Ratio     10 - 24  33 (H)   Globulin, Total     1.5 - 4.5 g/dL 1.9   eGFR     >08 MV/HQI/6.96 51 (L)     Legend: (H) High (L) Low I have reviewed the labs.   Pertinent Imaging:  10/05/23 15:00  Scan Result 5mL  Assessment & Plan:    1. Prostate cancer -PSA pending   2. Urinary incontinence  -PVR demonstrates adequate emptying -Managed with incontinent pads and pelvic floor exercises  3. Proteus UTI -I switched him to the Ceftin 250 mg twice daily, but his presentation is quite unusual for a UTI with majority of his symptoms being localized to the left groin and left flank, he may have a kidney stone, although a recent MR of the abdomen did not demonstrate any hydronephrosis, we will schedule a CT renal stone study for further evaluation of his symptoms  4. Left flank pain -CT renal stone study pending  Return in about 2 weeks (around 10/19/2023) for CT scan report .  [facility] [address] [place], [place] [address] [phone]

## 2023-10-05 ENCOUNTER — Encounter: Payer: Self-pay | Admitting: Urology

## 2023-10-05 ENCOUNTER — Other Ambulatory Visit
Admission: RE | Admit: 2023-10-05 | Discharge: 2023-10-05 | Disposition: A | Discharge status: Home / Self Care | Attending: Urology | Admitting: Urology

## 2023-10-05 ENCOUNTER — Ambulatory Visit (INDEPENDENT_AMBULATORY_CARE_PROVIDER_SITE_OTHER): Payer: Medicare HMO | Admitting: Urology

## 2023-10-05 VITALS — BP 127/66 | HR 60 | Ht 72.0 in | Wt 206.4 lb

## 2023-10-05 DIAGNOSIS — N390 Urinary tract infection, site not specified: Secondary | ICD-10-CM

## 2023-10-05 DIAGNOSIS — C61 Malignant neoplasm of prostate: Secondary | ICD-10-CM | POA: Diagnosis not present

## 2023-10-05 DIAGNOSIS — N393 Stress incontinence (female) (male): Secondary | ICD-10-CM | POA: Diagnosis not present

## 2023-10-05 DIAGNOSIS — B964 Proteus (mirabilis) (morganii) as the cause of diseases classified elsewhere: Secondary | ICD-10-CM

## 2023-10-05 DIAGNOSIS — R109 Unspecified abdominal pain: Secondary | ICD-10-CM | POA: Diagnosis not present

## 2023-10-05 LAB — BLADDER SCAN AMB NON-IMAGING

## 2023-10-05 MED ORDER — CEFUROXIME AXETIL 250 MG PO TABS
250.0000 mg | ORAL_TABLET | Freq: Two times a day (BID) | ORAL | 0 refills | Status: AC
Start: 2023-10-05 — End: 2023-10-10

## 2023-10-05 NOTE — Addendum Note (Signed)
 Addended by: Frankey Shown on: 10/05/2023 03:30 PM   Modules accepted: Orders

## 2023-10-06 LAB — PSA: Prostatic Specific Antigen: 0.01 ng/mL (ref 0.00–4.00)

## 2023-10-16 NOTE — Progress Notes (Deleted)
 10/16/23 10:09 AM   [name] [name] [birth year] [id number]  Referring provider:  Miki Kins, FNP [address] [place],  [place] [address]  Urological history Prostate cancer  -PSA pending -s/p perineal prostatectomy in 1995  2. Urinary incontinence, male stress incontinence - Referred to physical therapy  - Managed with incontinence clamp on active days/condom catheters  HPI: [name] [name] is a 88 y.o.male who presents today for yearly visit.    Previous records reviewed.   At his visit on 10/05/2023, He had abdominal MRI for pancreatic cyst in February and no abnormalities were noted within the kidneys.  He was diagnosed with a Proteus UTI on September 28, 2023 and treated with culture appropriate antibiotics.  He states he has been on Cipro for 3 days and he has not gotten any better.  His symptoms started 6 to 8 weeks ago with left groin pain.  He attributed to a possible muscle strain but then for the last 3 to 4 weeks the pain has been radiating up into the left flank.  1 week ago he started experiencing dysuria.  He cannot lay on his left side or his back.  The pain is also exasperated by rising to a standing position.  He continues to have baseline urinary frequency of every 2 hours and getting up 3-4 times a night to urinate.  Patient denies any modifying or aggravating factors.  Patient denies any recent UTI's or gross hematuria.   Patient denies any fevers, chills, nausea or vomiting.   PVR 5 mL.  PSA 0.01.   CT has not been performed as of this visit.    PMH: Past Medical History:  Diagnosis Date   Arthritis    Benign neoplasm of colon 04/13/2008   Overview:  Benign Polyps Of The Large Intestine - colonoscopy 2006,dr Nance Pear of this note might be different from the original. Formatting of this note might be different from the original. Benign Polyps Of The Large Intestine - colonoscopy 2006,dr Nance Pear of this note might be different from the  original. Overview: Benign Polyps Of The Large Intestine - colonoscopy 2006,dr    Blurring of visual image 05/28/2018   Cancer (HCC) 1995   prostate ca    Chest pain 07/27/2016   Chest pain, unspecified 07/27/2016   Coronary artery disease    Esophageal stricture 08/2016   GERD (gastroesophageal reflux disease)    Glaucoma    History of hiatal hernia    Hx of CABG 02/24/2018   Formatting of this note might be different from the original. Two-vessel bypass. Formatting of this note might be different from the original. Formatting of this note might be different from the original. Two-vessel bypass. Formatting of this note might be different from the original. Formatting of this note might be different from the original. Two-vessel bypass.   Hypertension    Malignant neoplasm of prostate (HCC) 04/23/2017   MI (mitral incompetence)    MI, acute, non ST segment elevation (HCC) 10/05/2012   Formatting of this note might be different from the original. Note: Unchanged - x4   Old myocardial infarction 05/01/2019   Note: Unchanged - x4   Orthopedic aftercare 10/08/2017   Peripheral neuropathy    Peripheral vascular disease (HCC)    S/P shoulder replacement, left 09/25/2017   Skin sensation disturbance 06/23/2007   Formatting of this note might be different from the original. Tingling (Paresthesia) Formatting of this note might be different from the original. Formatting of  this note might be different from the original. Tingling (Paresthesia)   Tear of meniscus of knee 11/11/2013   Formatting of this note might be different from the original. Formatting of this note might be different from the original. ICD-10 cut over Formatting of this note might be different from the original. Overview: ICD-10 cut over   Vertigo     Surgical History: Past Surgical History:  Procedure Laterality Date   COLONOSCOPY     COLONOSCOPY WITH PROPOFOL N/A 03/27/2023   Procedure: COLONOSCOPY WITH PROPOFOL;   Surgeon: Jeani Hawking, MD;  Location: [facility];  Service: Gastroenterology;  Laterality: N/A;   CORONARY ANGIOPLASTY WITH STENT PLACEMENT  2005   x9    CORONARY ARTERY BYPASS GRAFT  2011   CORONARY STENT PLACEMENT  07/2014   ESOPHAGOGASTRODUODENOSCOPY     ESOPHAGOGASTRODUODENOSCOPY (EGD) WITH ESOPHAGEAL DILATION  2018   knee scope Bilateral    LEG SURGERY Right 09/2018   LIPOMA EXCISION N/A 09/24/2016   Procedure: EXCISION SUBCUTANEOUS LIPOMA ON UPPER BACK;  Surgeon: Manus Rudd, MD;  Location: [facility] OR;  Service: General;  Laterality: N/A;   POLYPECTOMY  03/27/2023   Procedure: POLYPECTOMY;  Surgeon: Jeani Hawking, MD;  Location: [facility];  Service: Gastroenterology;;   Prostectomy  1995   REVERSE SHOULDER ARTHROPLASTY Left 09/25/2017   REVERSE SHOULDER ARTHROPLASTY Left 09/25/2017   Procedure: LEFT REVERSE SHOULDER ARTHROPLASTY; deltoid repair;  Surgeon: Beverely Low, MD;  Location: [facility];  Service: Orthopedics;  Laterality: Left;   ROTATOR CUFF REPAIR Left 2001    Home Medications:  Allergies as of 10/19/2023       Reactions   Docosahexaenoic Acid (dha) (fish) Diarrhea   Lovaza [omega-3-acid Ethyl Esters (fish)] Diarrhea   Atorvastatin Other (See Comments), Nausea Only   Joint pain   Hydrocodone Rash, Itching, Other (See Comments)   Ranolazine Diarrhea   Rosuvastatin Nausea Only, Other (See Comments)   Joint pain Other reaction(s): Unknown Joint pain    Joint pain  Other reaction(s): Unknown   Statins Other (See Comments)   Leg pain    Folic Acid    Note: anxiety itching and nausea   Folic Acid-vit B6-vit B12 Anxiety, Itching, Nausea Only   folic acid / vitamin B12 / vitamin B6   Pravastatin Nausea Only, Other (See Comments)        Medication List        Accurate as of October 16, 2023 10:09 AM. If you have any questions, ask your nurse or doctor.          acetaminophen 650 MG CR tablet Commonly known as: TYLENOL Take 650 mg by mouth as needed for pain.  2 tablets in the morning and 2 tablets at bedtime   amLODipine 10 MG tablet Commonly known as: NORVASC Take 1 tablet (10 mg total) by mouth daily.   aspirin EC 81 MG tablet Take 81 mg by mouth daily.   azelastine 0.05 % ophthalmic solution Commonly known as: OPTIVAR USE ONE DROP IN BOTH EYES TWICE DAILY   calcium carbonate 500 MG chewable tablet Commonly known as: TUMS - dosed in mg elemental calcium Chew 2 tablets by mouth daily as needed for indigestion or heartburn.   celecoxib 200 MG capsule Commonly known as: CELEBREX TAKE 1 CAPSULE BY MOUTH ONCE DAILY AT NOON   cholecalciferol 1000 units tablet Commonly known as: VITAMIN D Take 1,000 Units by mouth daily.   cycloSPORINE 0.05 % ophthalmic emulsion Commonly known as: RESTASIS Place 1 drop into both  eyes 2 (two) times daily.   fenofibrate 145 MG tablet Commonly known as: TRICOR TAKE 1 TABLET BY MOUTH ONCE EVERY EVENING   fluticasone 50 MCG/ACT nasal spray Commonly known as: FLONASE Place 1-2 sprays into both nostrils as needed.   ibuprofen 600 MG tablet Commonly known as: ADVIL Take by mouth.   latanoprost 0.005 % ophthalmic solution Commonly known as: XALATAN PLACE 1 DROP INTO AFFECTED EYE ONCE EVERY EVENING FOR GLUCOMA   levocetirizine 5 MG tablet Commonly known as: XYZAL Take 1 tablet (5 mg total) by mouth every evening.   lisinopril 20 MG tablet Commonly known as: ZESTRIL TAKE ONE TABLET BY MOUTH TWICE DAILY   meclizine 12.5 MG tablet Commonly known as: ANTIVERT Take 1 tablet by mouth daily.   metoprolol tartrate 25 MG tablet Commonly known as: LOPRESSOR TAKE 1/2 TABLET BY MOUTH TWICE DAILY   nitroGLYCERIN 0.4 MG SL tablet Commonly known as: NITROSTAT Place 1 tablet (0.4 mg total) under the tongue every 5 (five) minutes as needed for chest pain.   nystatin-triamcinolone cream Commonly known as: MYCOLOG II APPLY TO AFFECTED AREA(s) TWICE DAILY   ofloxacin 0.3 % OTIC solution Commonly known  as: FLOXIN Place 5 drops into the left ear daily.   pantoprazole 40 MG tablet Commonly known as: PROTONIX TAKE 1 TABLET BY MOUTH TWICE DAILY   pregabalin 75 MG capsule Commonly known as: Lyrica Take 1 capsule (75 mg total) by mouth 2 (two) times daily.   Repatha SureClick 140 MG/ML Soaj Generic drug: Evolocumab INJECT THE contents of ONE pen into THE SKIN ONCE every 14 DAYS   SYSTANE OP Apply 1 drop to eye 5 (five) times daily as needed (dry eyes).   zolpidem 10 MG tablet Commonly known as: AMBIEN TAKE ONE TABLET BY MOUTH EVERYDAY AT BEDTIME FOR INSOMNIA        Allergies:  Allergies  Allergen Reactions   Docosahexaenoic Acid (Dha) (Fish) Diarrhea   Lovaza [Omega-3-Acid Ethyl Esters (Fish)] Diarrhea   Atorvastatin Other (See Comments) and Nausea Only    Joint pain   Hydrocodone Rash, Itching and Other (See Comments)   Ranolazine Diarrhea   Rosuvastatin Nausea Only and Other (See Comments)    Joint pain  Other reaction(s): Unknown  Joint pain    Joint pain  Other reaction(s): Unknown   Statins Other (See Comments)    Leg pain    Folic Acid     Note: anxiety itching and nausea   Folic Acid-Vit B6-Vit B12 Anxiety, Itching and Nausea Only    folic acid / vitamin B12 / vitamin B6   Pravastatin Nausea Only and Other (See Comments)    Family History: Family History  Problem Relation Age of Onset   Alzheimer's disease Mother    Stroke Father    Parkinson's disease Sister    Cancer Sister    Obesity Sister    Diabetes Mellitus II Sister     Social History:  reports that he has never smoked. He has never been exposed to tobacco smoke. He has never used smokeless tobacco. He reports that he does not currently use alcohol after a past usage of about 7.0 standard drinks of alcohol per week. He reports that he does not use drugs.   Physical Exam: There were no vitals taken for this visit.  Constitutional:  Well nourished. Alert and oriented, No acute  distress. HEENT: [place] AT, moist mucus membranes.  Trachea midline, no masses. Cardiovascular: No clubbing, cyanosis, or edema. Respiratory: Normal respiratory effort,  no increased work of breathing. GI: Abdomen is soft, non tender, non distended, no abdominal masses. Liver and spleen not palpable.  No hernias appreciated.  Stool sample for occult testing is not indicated.   GU: No CVA tenderness.  No bladder fullness or masses.  Patient with circumcised/uncircumcised phallus. ***Foreskin easily retracted***  Urethral meatus is patent.  No penile discharge. No penile lesions or rashes. Scrotum without lesions, cysts, rashes and/or edema.  Testicles are located scrotally bilaterally. No masses are appreciated in the testicles. Left and right epididymis are normal. Rectal: Patient with  normal sphincter tone. Anus and perineum without scarring or rashes. No rectal masses are appreciated. Prostate is approximately *** grams, *** nodules are appreciated. Seminal vesicles are normal. Skin: No rashes, bruises or suspicious lesions. Lymph: No cervical or inguinal adenopathy. Neurologic: Grossly intact, no focal deficits, moving all 4 extremities. Psychiatric: Normal mood and affect.   Laboratory Data: Results for orders placed or performed during the hospital encounter of 10/05/23  PSA   Collection Time: 10/05/23  3:43 PM  Result Value Ref Range   Prostatic Specific Antigen 0.01 0.00 - 4.00 ng/mL  I have reviewed the labs.  See HPI.     Pertinent Imaging: N/A   Assessment & Plan:    1. Prostate cancer -PSA pending   2. Urinary incontinence  -PVR demonstrates adequate emptying -Managed with incontinent pads and pelvic floor exercises  3. Proteus UTI -I switched him to the Ceftin 250 mg twice daily, but his presentation is quite unusual for a UTI with majority of his symptoms being localized to the left groin and left flank, he may have a kidney stone, although a recent MR of the abdomen did  not demonstrate any hydronephrosis, we will schedule a CT renal stone study for further evaluation of his symptoms  4. Left flank pain -CT renal stone study pending  No follow-ups on file.  [facility] [address], [address] [place], [place] [address] [phone]

## 2023-10-19 ENCOUNTER — Ambulatory Visit: Admitting: Urology

## 2023-10-21 DIAGNOSIS — M25552 Pain in left hip: Secondary | ICD-10-CM | POA: Diagnosis not present

## 2023-10-21 DIAGNOSIS — S7002XA Contusion of left hip, initial encounter: Secondary | ICD-10-CM | POA: Diagnosis not present

## 2023-10-22 ENCOUNTER — Ambulatory Visit
Admission: RE | Admit: 2023-10-22 | Discharge: 2023-10-22 | Disposition: A | Discharge status: Home / Self Care | Source: Ambulatory Visit | Attending: Urology | Admitting: Urology

## 2023-10-22 DIAGNOSIS — R109 Unspecified abdominal pain: Secondary | ICD-10-CM | POA: Insufficient documentation

## 2023-10-22 DIAGNOSIS — N281 Cyst of kidney, acquired: Secondary | ICD-10-CM | POA: Diagnosis not present

## 2023-10-22 DIAGNOSIS — K5730 Diverticulosis of large intestine without perforation or abscess without bleeding: Secondary | ICD-10-CM | POA: Diagnosis not present

## 2023-10-22 DIAGNOSIS — N289 Disorder of kidney and ureter, unspecified: Secondary | ICD-10-CM | POA: Diagnosis not present

## 2023-10-26 ENCOUNTER — Ambulatory Visit (INDEPENDENT_AMBULATORY_CARE_PROVIDER_SITE_OTHER): Admitting: Family

## 2023-10-26 ENCOUNTER — Encounter: Payer: Self-pay | Admitting: Cardiovascular Disease

## 2023-10-26 ENCOUNTER — Ambulatory Visit (INDEPENDENT_AMBULATORY_CARE_PROVIDER_SITE_OTHER): Payer: Medicare HMO | Admitting: Cardiovascular Disease

## 2023-10-26 VITALS — BP 130/74 | HR 55 | Ht 72.0 in | Wt 208.0 lb

## 2023-10-26 DIAGNOSIS — E782 Mixed hyperlipidemia: Secondary | ICD-10-CM | POA: Diagnosis not present

## 2023-10-26 DIAGNOSIS — E538 Deficiency of other specified B group vitamins: Secondary | ICD-10-CM | POA: Diagnosis not present

## 2023-10-26 DIAGNOSIS — I10 Essential (primary) hypertension: Secondary | ICD-10-CM | POA: Diagnosis not present

## 2023-10-26 DIAGNOSIS — R0602 Shortness of breath: Secondary | ICD-10-CM | POA: Diagnosis not present

## 2023-10-26 DIAGNOSIS — I25728 Atherosclerosis of autologous artery coronary artery bypass graft(s) with other forms of angina pectoris: Secondary | ICD-10-CM | POA: Diagnosis not present

## 2023-10-26 DIAGNOSIS — R001 Bradycardia, unspecified: Secondary | ICD-10-CM

## 2023-10-26 MED ORDER — FUROSEMIDE 20 MG PO TABS: 20.0000 mg | ORAL_TABLET | Freq: Every day | ORAL | 11 refills | Status: AC

## 2023-10-26 MED ORDER — CYANOCOBALAMIN 1000 MCG/ML IJ SOLN
1000.0000 ug | Freq: Once | INTRAMUSCULAR | Status: AC
  Administered 2023-10-26: 1000 ug via INTRAMUSCULAR

## 2023-10-26 NOTE — Progress Notes (Signed)
 Cardiology Office Note   Date:  10/26/2023   ID:  [name], DOB 1935-10-02, MRN [id number]  PCP:  Miki Kins, FNP  Cardiologist:  Adrian Blackwater, MD      History of Present Illness: [name] [name] is a 88 y.o. male who presents for  Chief Complaint  Patient presents with   Follow-up    4 month follow up     Had a fall, apparently felt dizzy.      Past Medical History:  Diagnosis Date   Arthritis    Benign neoplasm of colon 04/13/2008   Overview:  Benign Polyps Of The Large Intestine - colonoscopy 2006,dr Nance Pear of this note might be different from the original. Formatting of this note might be different from the original. Benign Polyps Of The Large Intestine - colonoscopy 2006,dr Nance Pear of this note might be different from the original. Overview: Benign Polyps Of The Large Intestine - colonoscopy 2006,dr    Blurring of visual image 05/28/2018   Cancer (HCC) 1995   prostate ca    Chest pain 07/27/2016   Chest pain, unspecified 07/27/2016   Coronary artery disease    Esophageal stricture 08/2016   GERD (gastroesophageal reflux disease)    Glaucoma    History of hiatal hernia    Hx of CABG 02/24/2018   Formatting of this note might be different from the original. Two-vessel bypass. Formatting of this note might be different from the original. Formatting of this note might be different from the original. Two-vessel bypass. Formatting of this note might be different from the original. Formatting of this note might be different from the original. Two-vessel bypass.   Hypertension    Malignant neoplasm of prostate (HCC) 04/23/2017   MI (mitral incompetence)    MI, acute, non ST segment elevation (HCC) 10/05/2012   Formatting of this note might be different from the original. Note: Unchanged - x4   Old myocardial infarction 05/01/2019   Note: Unchanged - x4   Orthopedic aftercare 10/08/2017   Peripheral neuropathy    Peripheral  vascular disease (HCC)    S/P shoulder replacement, left 09/25/2017   Skin sensation disturbance 06/23/2007   Formatting of this note might be different from the original. Tingling (Paresthesia) Formatting of this note might be different from the original. Formatting of this note might be different from the original. Tingling (Paresthesia)   Tear of meniscus of knee 11/11/2013   Formatting of this note might be different from the original. Formatting of this note might be different from the original. ICD-10 cut over Formatting of this note might be different from the original. Overview: ICD-10 cut over   Vertigo      Past Surgical History:  Procedure Laterality Date   COLONOSCOPY     COLONOSCOPY WITH PROPOFOL N/A 03/27/2023   Procedure: COLONOSCOPY WITH PROPOFOL;  Surgeon: Jeani Hawking, MD;  Location: [facility];  Service: Gastroenterology;  Laterality: N/A;   CORONARY ANGIOPLASTY WITH STENT PLACEMENT  2005   x9    CORONARY ARTERY BYPASS GRAFT  2011   CORONARY STENT PLACEMENT  07/2014   ESOPHAGOGASTRODUODENOSCOPY     ESOPHAGOGASTRODUODENOSCOPY (EGD) WITH ESOPHAGEAL DILATION  2018   knee scope Bilateral    LEG SURGERY Right 09/2018   LIPOMA EXCISION N/A 09/24/2016   Procedure: EXCISION SUBCUTANEOUS LIPOMA ON UPPER BACK;  Surgeon: Manus Rudd, MD;  Location: [facility] OR;  Service: General;  Laterality: N/A;   POLYPECTOMY  03/27/2023   Procedure: POLYPECTOMY;  Surgeon: Alvis Jourdain, MD;  Location: [facility];  Service: Gastroenterology;;   Prostectomy  1995   REVERSE SHOULDER ARTHROPLASTY Left 09/25/2017   REVERSE SHOULDER ARTHROPLASTY Left 09/25/2017   Procedure: LEFT REVERSE SHOULDER ARTHROPLASTY; deltoid repair;  Surgeon: Winston Hawking, MD;  Location: [facility] OR;  Service: Orthopedics;  Laterality: Left;   ROTATOR CUFF REPAIR Left 2001     Current Outpatient Medications  Medication Sig Dispense Refill   furosemide (LASIX) 20 MG tablet Take 1 tablet (20 mg total) by mouth daily. 30 tablet  11   acetaminophen (TYLENOL) 650 MG CR tablet Take 650 mg by mouth as needed for pain. 2 tablets in the morning and 2 tablets at bedtime     amLODipine (NORVASC) 10 MG tablet Take 1 tablet (10 mg total) by mouth daily. 90 tablet 3   aspirin EC 81 MG tablet Take 81 mg by mouth daily.     azelastine (OPTIVAR) 0.05 % ophthalmic solution USE ONE DROP IN BOTH EYES TWICE DAILY 6 mL 5   calcium carbonate (TUMS - DOSED IN MG ELEMENTAL CALCIUM) 500 MG chewable tablet Chew 2 tablets by mouth daily as needed for indigestion or heartburn.     celecoxib (CELEBREX) 200 MG capsule TAKE 1 CAPSULE BY MOUTH ONCE DAILY AT NOON 30 capsule 5   cholecalciferol (VITAMIN D) 1000 units tablet Take 1,000 Units by mouth daily.     cycloSPORINE (RESTASIS) 0.05 % ophthalmic emulsion Place 1 drop into both eyes 2 (two) times daily.     fenofibrate (TRICOR) 145 MG tablet TAKE 1 TABLET BY MOUTH ONCE EVERY EVENING 90 tablet 3   fluticasone (FLONASE) 50 MCG/ACT nasal spray Place 1-2 sprays into both nostrils as needed.     ibuprofen (ADVIL) 600 MG tablet Take by mouth.     latanoprost (XALATAN) 0.005 % ophthalmic solution PLACE 1 DROP INTO AFFECTED EYE ONCE EVERY EVENING FOR GLUCOMA 2.5 mL 5   levocetirizine (XYZAL) 5 MG tablet Take 1 tablet (5 mg total) by mouth every evening. 90 tablet 1   lisinopril (ZESTRIL) 20 MG tablet TAKE ONE TABLET BY MOUTH TWICE DAILY 180 tablet 1   meclizine (ANTIVERT) 12.5 MG tablet Take 1 tablet by mouth daily.     metoprolol tartrate (LOPRESSOR) 25 MG tablet TAKE 1/2 TABLET BY MOUTH TWICE DAILY 90 tablet 3   nitroGLYCERIN (NITROSTAT) 0.4 MG SL tablet Place 1 tablet (0.4 mg total) under the tongue every 5 (five) minutes as needed for chest pain. 30 tablet 3   nystatin-triamcinolone (MYCOLOG II) cream APPLY TO AFFECTED AREA(s) TWICE DAILY 60 g 5   ofloxacin (FLOXIN) 0.3 % OTIC solution Place 5 drops into the left ear daily. 10 mL 0   pantoprazole (PROTONIX) 40 MG tablet TAKE 1 TABLET BY MOUTH TWICE  DAILY 60 tablet 6   Polyethyl Glycol-Propyl Glycol (SYSTANE OP) Apply 1 drop to eye 5 (five) times daily as needed (dry eyes).     pregabalin (LYRICA) 75 MG capsule Take 1 capsule (75 mg total) by mouth 2 (two) times daily. 60 capsule 3   REPATHA SURECLICK 140 MG/ML SOAJ INJECT THE contents of ONE pen into THE SKIN ONCE every 14 DAYS 6 mL 2   zolpidem (AMBIEN) 10 MG tablet TAKE ONE TABLET BY MOUTH EVERYDAY AT BEDTIME FOR INSOMNIA 30 tablet 3   No current facility-administered medications for this visit.    Allergies:   Docosahexaenoic acid (dha) (fish), Lovaza [omega-3-acid ethyl esters (fish)], Atorvastatin, Hydrocodone, Ranolazine, Rosuvastatin, Statins, Folic acid, Folic acid-vit  b6-vit b12, and Pravastatin    Social History:   reports that he has never smoked. He has never been exposed to tobacco smoke. He has never used smokeless tobacco. He reports that he does not currently use alcohol after a past usage of about 7.0 standard drinks of alcohol per week. He reports that he does not use drugs.   Family History:  family history includes Alzheimer's disease in his mother; Cancer in his sister; Diabetes Mellitus II in his sister; Obesity in his sister; Parkinson's disease in his sister; Stroke in his father.    ROS:     Review of Systems  Constitutional: Negative.   HENT: Negative.    Eyes: Negative.   Respiratory: Negative.    Gastrointestinal: Negative.   Genitourinary: Negative.   Musculoskeletal: Negative.   Skin: Negative.   Neurological: Negative.   Endo/Heme/Allergies: Negative.   Psychiatric/Behavioral: Negative.    All other systems reviewed and are negative.     All other systems are reviewed and negative.    PHYSICAL EXAM: VS:  BP 130/74   Pulse (!) 55   Wt 208 lb (94.3 kg)   SpO2 93%   BMI 28.21 kg/m  , BMI Body mass index is 28.21 kg/m. Last weight:  Wt Readings from Last 3 Encounters:  10/26/23 208 lb (94.3 kg)  10/05/23 206 lb 6.4 oz (93.6 kg)   09/28/23 207 lb (93.9 kg)     Physical Exam Vitals reviewed.  Constitutional:      Appearance: Normal appearance. He is normal weight.  HENT:     Head: Normocephalic.     Nose: Nose normal.     Mouth/Throat:     Mouth: Mucous membranes are moist.  Eyes:     Pupils: Pupils are equal, round, and reactive to light.  Cardiovascular:     Rate and Rhythm: Normal rate and regular rhythm.     Pulses: Normal pulses.     Heart sounds: Normal heart sounds.  Pulmonary:     Effort: Pulmonary effort is normal.  Abdominal:     General: Abdomen is flat. Bowel sounds are normal.  Musculoskeletal:        General: Normal range of motion.     Cervical back: Normal range of motion.  Skin:    General: Skin is warm.  Neurological:     General: No focal deficit present.     Mental Status: He is alert.  Psychiatric:        Mood and Affect: Mood normal.       EKG:   Recent Labs: 09/28/2023: ALT 10; BUN 45; Creatinine, Ser 1.35; Hemoglobin 14.1; Platelets 195; Potassium 4.6; Sodium 141; TSH 0.448    Lipid Panel    Component Value Date/Time   CHOL 103 09/28/2023 1150   TRIG 113 09/28/2023 1150   HDL 49 09/28/2023 1150   CHOLHDL 2.1 09/28/2023 1150   LDLCALC 33 09/28/2023 1150   LDLDIRECT 62 02/18/2021 0808      Other studies Reviewed: Additional studies/ records that were reviewed today include:  Review of the above records demonstrates:       No data to display            ASSESSMENT AND PLAN:    ICD-10-CM   1. SOB (shortness of breath)  R06.02 furosemide (LASIX) 20 MG tablet    PCV ECHOCARDIOGRAM COMPLETE   Patient has SOB, add lasix, get echo    2. Mixed hyperlipidemia  E78.2 furosemide (LASIX) 20 MG tablet  PCV ECHOCARDIOGRAM COMPLETE    3. Essential hypertension  I10 furosemide (LASIX) 20 MG tablet    PCV ECHOCARDIOGRAM COMPLETE    4. Coronary artery disease of autologous bypass graft with stable angina pectoris (HCC)  I25.728 furosemide (LASIX) 20 MG  tablet    PCV ECHOCARDIOGRAM COMPLETE    5. Sinus bradycardia  R00.1 furosemide (LASIX) 20 MG tablet    PCV ECHOCARDIOGRAM COMPLETE       Problem List Items Addressed This Visit       Cardiovascular and Mediastinum   Essential hypertension   Relevant Medications   furosemide (LASIX) 20 MG tablet   Other Relevant Orders   PCV ECHOCARDIOGRAM COMPLETE     Other   Hyperlipidemia   Relevant Medications   furosemide (LASIX) 20 MG tablet   Other Relevant Orders   PCV ECHOCARDIOGRAM COMPLETE   Other Visit Diagnoses       SOB (shortness of breath)    -  Primary   Patient has SOB, add lasix, get echo   Relevant Medications   furosemide (LASIX) 20 MG tablet   Other Relevant Orders   PCV ECHOCARDIOGRAM COMPLETE     Coronary artery disease of autologous bypass graft with stable angina pectoris (HCC)       Relevant Medications   furosemide (LASIX) 20 MG tablet   Other Relevant Orders   PCV ECHOCARDIOGRAM COMPLETE     Sinus bradycardia       Relevant Medications   furosemide (LASIX) 20 MG tablet   Other Relevant Orders   PCV ECHOCARDIOGRAM COMPLETE          Disposition:   Return in about 3 weeks (around 11/16/2023) for echo and f/u.    Total time spent: 35 minutes  Signed,  Debborah Fairly, MD  10/26/2023 10:41 AM    [facility]

## 2023-10-29 ENCOUNTER — Ambulatory Visit

## 2023-11-04 ENCOUNTER — Ambulatory Visit (INDEPENDENT_AMBULATORY_CARE_PROVIDER_SITE_OTHER)

## 2023-11-04 DIAGNOSIS — I351 Nonrheumatic aortic (valve) insufficiency: Secondary | ICD-10-CM

## 2023-11-04 DIAGNOSIS — I25728 Atherosclerosis of autologous artery coronary artery bypass graft(s) with other forms of angina pectoris: Secondary | ICD-10-CM

## 2023-11-04 DIAGNOSIS — I10 Essential (primary) hypertension: Secondary | ICD-10-CM

## 2023-11-04 DIAGNOSIS — I340 Nonrheumatic mitral (valve) insufficiency: Secondary | ICD-10-CM

## 2023-11-04 DIAGNOSIS — R001 Bradycardia, unspecified: Secondary | ICD-10-CM

## 2023-11-04 DIAGNOSIS — E782 Mixed hyperlipidemia: Secondary | ICD-10-CM

## 2023-11-04 DIAGNOSIS — R0602 Shortness of breath: Secondary | ICD-10-CM

## 2023-11-04 DIAGNOSIS — I361 Nonrheumatic tricuspid (valve) insufficiency: Secondary | ICD-10-CM | POA: Diagnosis not present

## 2023-11-05 DIAGNOSIS — H353122 Nonexudative age-related macular degeneration, left eye, intermediate dry stage: Secondary | ICD-10-CM | POA: Diagnosis not present

## 2023-11-05 DIAGNOSIS — H353211 Exudative age-related macular degeneration, right eye, with active choroidal neovascularization: Secondary | ICD-10-CM | POA: Diagnosis not present

## 2023-11-05 DIAGNOSIS — H2513 Age-related nuclear cataract, bilateral: Secondary | ICD-10-CM | POA: Diagnosis not present

## 2023-11-05 DIAGNOSIS — H43813 Vitreous degeneration, bilateral: Secondary | ICD-10-CM | POA: Diagnosis not present

## 2023-11-12 DIAGNOSIS — L905 Scar conditions and fibrosis of skin: Secondary | ICD-10-CM | POA: Diagnosis not present

## 2023-11-12 DIAGNOSIS — Z85828 Personal history of other malignant neoplasm of skin: Secondary | ICD-10-CM | POA: Diagnosis not present

## 2023-11-12 DIAGNOSIS — Z872 Personal history of diseases of the skin and subcutaneous tissue: Secondary | ICD-10-CM | POA: Diagnosis not present

## 2023-11-12 DIAGNOSIS — L578 Other skin changes due to chronic exposure to nonionizing radiation: Secondary | ICD-10-CM | POA: Diagnosis not present

## 2023-11-12 DIAGNOSIS — L821 Other seborrheic keratosis: Secondary | ICD-10-CM | POA: Diagnosis not present

## 2023-11-12 DIAGNOSIS — Z859 Personal history of malignant neoplasm, unspecified: Secondary | ICD-10-CM | POA: Diagnosis not present

## 2023-11-13 ENCOUNTER — Encounter: Payer: Self-pay | Admitting: Cardiovascular Disease

## 2023-11-13 ENCOUNTER — Ambulatory Visit: Admitting: Cardiovascular Disease

## 2023-11-13 VITALS — BP 105/50 | HR 53 | Ht 73.0 in | Wt 210.0 lb

## 2023-11-13 DIAGNOSIS — I10 Essential (primary) hypertension: Secondary | ICD-10-CM

## 2023-11-13 DIAGNOSIS — E782 Mixed hyperlipidemia: Secondary | ICD-10-CM | POA: Diagnosis not present

## 2023-11-13 DIAGNOSIS — R0602 Shortness of breath: Secondary | ICD-10-CM | POA: Diagnosis not present

## 2023-11-13 DIAGNOSIS — R001 Bradycardia, unspecified: Secondary | ICD-10-CM

## 2023-11-13 DIAGNOSIS — R55 Syncope and collapse: Secondary | ICD-10-CM | POA: Diagnosis not present

## 2023-11-13 DIAGNOSIS — I25728 Atherosclerosis of autologous artery coronary artery bypass graft(s) with other forms of angina pectoris: Secondary | ICD-10-CM

## 2023-11-13 MED ORDER — LISINOPRIL 10 MG PO TABS: 10.0000 mg | ORAL_TABLET | Freq: Every day | ORAL | 2 refills | Status: DC

## 2023-11-13 MED ORDER — METOPROLOL SUCCINATE ER 25 MG PO TB24: 12.5000 mg | ORAL_TABLET | Freq: Every day | ORAL | 11 refills | Status: AC

## 2023-11-13 NOTE — Progress Notes (Signed)
 Cardiology Office Note   Date:  11/13/2023   ID:  [name] [name], DOB 1935-12-24, MRN [id number]  PCP:  Trenda Frisk, FNP  Cardiologist:  Debborah Fairly, MD      History of Present Illness: [name] [name] is a 88 y.o. male who presents for  Chief Complaint  Patient presents with   Follow-up    3 week echo results    Passed out Sunday night and was out for a minute. He was on floor and cut arm.      Past Medical History:  Diagnosis Date   Arthritis    Benign neoplasm of colon 04/13/2008   Overview:  Benign Polyps Of The Large Intestine - colonoscopy 2006,dr Mannie Seek of this note might be different from the original. Formatting of this note might be different from the original. Benign Polyps Of The Large Intestine - colonoscopy 2006,dr Mannie Seek of this note might be different from the original. Overview: Benign Polyps Of The Large Intestine - colonoscopy 2006,dr    Blurring of visual image 05/28/2018   Cancer (HCC) 1995   prostate ca    Chest pain 07/27/2016   Chest pain, unspecified 07/27/2016   Coronary artery disease    Esophageal stricture 08/2016   GERD (gastroesophageal reflux disease)    Glaucoma    History of hiatal hernia    Hx of CABG 02/24/2018   Formatting of this note might be different from the original. Two-vessel bypass. Formatting of this note might be different from the original. Formatting of this note might be different from the original. Two-vessel bypass. Formatting of this note might be different from the original. Formatting of this note might be different from the original. Two-vessel bypass.   Hypertension    Malignant neoplasm of prostate (HCC) 04/23/2017   MI (mitral incompetence)    MI, acute, non ST segment elevation (HCC) 10/05/2012   Formatting of this note might be different from the original. Note: Unchanged - x4   Old myocardial infarction 05/01/2019   Note: Unchanged - x4   Orthopedic aftercare 10/08/2017    Peripheral neuropathy    Peripheral vascular disease (HCC)    S/P shoulder replacement, left 09/25/2017   Skin sensation disturbance 06/23/2007   Formatting of this note might be different from the original. Tingling (Paresthesia) Formatting of this note might be different from the original. Formatting of this note might be different from the original. Tingling (Paresthesia)   Tear of meniscus of knee 11/11/2013   Formatting of this note might be different from the original. Formatting of this note might be different from the original. ICD-10 cut over Formatting of this note might be different from the original. Overview: ICD-10 cut over   Vertigo      Past Surgical History:  Procedure Laterality Date   COLONOSCOPY     COLONOSCOPY WITH PROPOFOL  N/A 03/27/2023   Procedure: COLONOSCOPY WITH PROPOFOL ;  Surgeon: Alvis Jourdain, MD;  Location: [facility];  Service: Gastroenterology;  Laterality: N/A;   CORONARY ANGIOPLASTY WITH STENT PLACEMENT  2005   x9    CORONARY ARTERY BYPASS GRAFT  2011   CORONARY STENT PLACEMENT  07/2014   ESOPHAGOGASTRODUODENOSCOPY     ESOPHAGOGASTRODUODENOSCOPY (EGD) WITH ESOPHAGEAL DILATION  2018   knee scope Bilateral    LEG SURGERY Right 09/2018   LIPOMA EXCISION N/A 09/24/2016   Procedure: EXCISION SUBCUTANEOUS LIPOMA ON UPPER BACK;  Surgeon: Dareen Ebbing, MD;  Location: [facility] OR;  Service: General;  Laterality:  N/A;   POLYPECTOMY  03/27/2023   Procedure: POLYPECTOMY;  Surgeon: Alvis Jourdain, MD;  Location: [facility];  Service: Gastroenterology;;   Prostectomy  1995   REVERSE SHOULDER ARTHROPLASTY Left 09/25/2017   REVERSE SHOULDER ARTHROPLASTY Left 09/25/2017   Procedure: LEFT REVERSE SHOULDER ARTHROPLASTY; deltoid repair;  Surgeon: Winston Hawking, MD;  Location: [facility] OR;  Service: Orthopedics;  Laterality: Left;   ROTATOR CUFF REPAIR Left 2001     Current Outpatient Medications  Medication Sig Dispense Refill   acetaminophen  (TYLENOL ) 650 MG CR tablet Take  650 mg by mouth as needed for pain. 2 tablets in the morning and 2 tablets at bedtime     amLODipine  (NORVASC ) 10 MG tablet Take 1 tablet (10 mg total) by mouth daily. 90 tablet 3   aspirin  EC 81 MG tablet Take 81 mg by mouth daily.     azelastine  (OPTIVAR ) 0.05 % ophthalmic solution USE ONE DROP IN BOTH EYES TWICE DAILY 6 mL 5   calcium  carbonate (TUMS - DOSED IN MG ELEMENTAL CALCIUM ) 500 MG chewable tablet Chew 2 tablets by mouth daily as needed for indigestion or heartburn.     celecoxib (CELEBREX) 200 MG capsule TAKE 1 CAPSULE BY MOUTH ONCE DAILY AT NOON 30 capsule 5   cholecalciferol  (VITAMIN D ) 1000 units tablet Take 1,000 Units by mouth daily.     cycloSPORINE  (RESTASIS ) 0.05 % ophthalmic emulsion Place 1 drop into both eyes 2 (two) times daily.     fenofibrate  (TRICOR ) 145 MG tablet TAKE 1 TABLET BY MOUTH ONCE EVERY EVENING 90 tablet 3   fluticasone (FLONASE) 50 MCG/ACT nasal spray Place 1-2 sprays into both nostrils as needed.     furosemide  (LASIX ) 20 MG tablet Take 1 tablet (20 mg total) by mouth daily. 30 tablet 11   ibuprofen (ADVIL) 600 MG tablet Take by mouth.     latanoprost  (XALATAN ) 0.005 % ophthalmic solution PLACE 1 DROP INTO AFFECTED EYE ONCE EVERY EVENING FOR GLUCOMA 2.5 mL 5   levocetirizine (XYZAL ) 5 MG tablet Take 1 tablet (5 mg total) by mouth every evening. 90 tablet 1   lisinopril  (ZESTRIL ) 10 MG tablet Take 1 tablet (10 mg total) by mouth daily. 30 tablet 2   meclizine (ANTIVERT) 12.5 MG tablet Take 1 tablet by mouth daily.     metoprolol  succinate (TOPROL  XL) 25 MG 24 hr tablet Take 0.5 tablets (12.5 mg total) by mouth daily. 15 tablet 11   nitroGLYCERIN  (NITROSTAT ) 0.4 MG SL tablet Place 1 tablet (0.4 mg total) under the tongue every 5 (five) minutes as needed for chest pain. 30 tablet 3   nystatin-triamcinolone  (MYCOLOG II) cream APPLY TO AFFECTED AREA(s) TWICE DAILY 60 g 5   ofloxacin  (FLOXIN ) 0.3 % OTIC solution Place 5 drops into the left ear daily. 10 mL 0    pantoprazole  (PROTONIX ) 40 MG tablet TAKE 1 TABLET BY MOUTH TWICE DAILY 60 tablet 6   Polyethyl Glycol-Propyl Glycol (SYSTANE OP) Apply 1 drop to eye 5 (five) times daily as needed (dry eyes).     pregabalin  (LYRICA ) 75 MG capsule Take 1 capsule (75 mg total) by mouth 2 (two) times daily. 60 capsule 3   REPATHA  SURECLICK 140 MG/ML SOAJ INJECT THE contents of ONE pen into THE SKIN ONCE every 14 DAYS 6 mL 2   zolpidem  (AMBIEN ) 10 MG tablet TAKE ONE TABLET BY MOUTH EVERYDAY AT BEDTIME FOR INSOMNIA 30 tablet 3   No current facility-administered medications for this visit.    Allergies:  Docosahexaenoic acid (dha) (fish), Lovaza [omega-3-acid ethyl esters (fish)], Atorvastatin, Hydrocodone, Ranolazine, Rosuvastatin, Statins, Folic acid, Folic acid-vit V4-UJW b12, and Pravastatin    Social History:   reports that he has never smoked. He has never been exposed to tobacco smoke. He has never used smokeless tobacco. He reports that he does not currently use alcohol  after a past usage of about 7.0 standard drinks of alcohol  per week. He reports that he does not use drugs.   Family History:  family history includes Alzheimer's disease in his mother; Cancer in his sister; Diabetes Mellitus II in his sister; Obesity in his sister; Parkinson's disease in his sister; Stroke in his father.    ROS:     Review of Systems  Constitutional: Negative.   HENT: Negative.    Eyes: Negative.   Respiratory: Negative.    Gastrointestinal: Negative.   Genitourinary: Negative.   Musculoskeletal: Negative.   Skin: Negative.   Neurological: Negative.   Endo/Heme/Allergies: Negative.   Psychiatric/Behavioral: Negative.    All other systems reviewed and are negative.     All other systems are reviewed and negative.    PHYSICAL EXAM: VS:  BP (!) 105/50   Pulse (!) 53   Ht 6\' 1"  (1.854 m)   Wt 210 lb (95.3 kg)   SpO2 97%   BMI 27.71 kg/m  , BMI Body mass index is 27.71 kg/m. Last weight:  Wt Readings  from Last 3 Encounters:  11/13/23 210 lb (95.3 kg)  10/26/23 208 lb (94.3 kg)  10/05/23 206 lb 6.4 oz (93.6 kg)     Physical Exam Vitals reviewed.  Constitutional:      Appearance: Normal appearance. He is normal weight.  HENT:     Head: Normocephalic.     Nose: Nose normal.     Mouth/Throat:     Mouth: Mucous membranes are moist.  Eyes:     Pupils: Pupils are equal, round, and reactive to light.  Cardiovascular:     Rate and Rhythm: Normal rate and regular rhythm.     Pulses: Normal pulses.     Heart sounds: Normal heart sounds.  Pulmonary:     Effort: Pulmonary effort is normal.  Abdominal:     General: Abdomen is flat. Bowel sounds are normal.  Musculoskeletal:        General: Normal range of motion.     Cervical back: Normal range of motion.  Skin:    General: Skin is warm.  Neurological:     General: No focal deficit present.     Mental Status: He is alert.  Psychiatric:        Mood and Affect: Mood normal.       EKG:   Recent Labs: 09/28/2023: ALT 10; BUN 45; Creatinine, Ser 1.35; Hemoglobin 14.1; Platelets 195; Potassium 4.6; Sodium 141; TSH 0.448    Lipid Panel    Component Value Date/Time   CHOL 103 09/28/2023 1150   TRIG 113 09/28/2023 1150   HDL 49 09/28/2023 1150   CHOLHDL 2.1 09/28/2023 1150   LDLCALC 33 09/28/2023 1150   LDLDIRECT 62 02/18/2021 0808      Other studies Reviewed: Additional studies/ records that were reviewed today include:  Review of the above records demonstrates:       No data to display            ASSESSMENT AND PLAN:    ICD-10-CM   1. Coronary artery disease of autologous bypass graft with stable angina pectoris (HCC)  I25.728 CARDIAC EVENT MONITOR    MYOCARDIAL PERFUSION IMAGING    metoprolol  succinate (TOPROL  XL) 25 MG 24 hr tablet    lisinopril  (ZESTRIL ) 10 MG tablet    2. SOB (shortness of breath)  R06.02 CARDIAC EVENT MONITOR    MYOCARDIAL PERFUSION IMAGING    metoprolol  succinate (TOPROL  XL) 25 MG  24 hr tablet    lisinopril  (ZESTRIL ) 10 MG tablet    3. Sinus bradycardia  R00.1 CARDIAC EVENT MONITOR    MYOCARDIAL PERFUSION IMAGING    metoprolol  succinate (TOPROL  XL) 25 MG 24 hr tablet    lisinopril  (ZESTRIL ) 10 MG tablet    4. Essential hypertension  I10 CARDIAC EVENT MONITOR    MYOCARDIAL PERFUSION IMAGING    metoprolol  succinate (TOPROL  XL) 25 MG 24 hr tablet    lisinopril  (ZESTRIL ) 10 MG tablet   Decrease lisinopril  10 mg and change metoprolol  to 12.5 daily    5. Mixed hyperlipidemia  E78.2 CARDIAC EVENT MONITOR    MYOCARDIAL PERFUSION IMAGING    metoprolol  succinate (TOPROL  XL) 25 MG 24 hr tablet    lisinopril  (ZESTRIL ) 10 MG tablet    6. Syncope, unspecified syncope type  R55 CARDIAC EVENT MONITOR    MYOCARDIAL PERFUSION IMAGING    metoprolol  succinate (TOPROL  XL) 25 MG 24 hr tablet    lisinopril  (ZESTRIL ) 10 MG tablet   Has repeated syncope, echo was unremarkable. But need to r/o ischaemia as has CAD, do stress test. Also do monitor       Problem List Items Addressed This Visit       Cardiovascular and Mediastinum   Essential hypertension   Relevant Medications   metoprolol  succinate (TOPROL  XL) 25 MG 24 hr tablet   lisinopril  (ZESTRIL ) 10 MG tablet   Other Relevant Orders   CARDIAC EVENT MONITOR   MYOCARDIAL PERFUSION IMAGING     Other   Hyperlipidemia   Relevant Medications   metoprolol  succinate (TOPROL  XL) 25 MG 24 hr tablet   lisinopril  (ZESTRIL ) 10 MG tablet   Other Relevant Orders   CARDIAC EVENT MONITOR   MYOCARDIAL PERFUSION IMAGING   Other Visit Diagnoses       Coronary artery disease of autologous bypass graft with stable angina pectoris (HCC)    -  Primary   Relevant Medications   metoprolol  succinate (TOPROL  XL) 25 MG 24 hr tablet   lisinopril  (ZESTRIL ) 10 MG tablet   Other Relevant Orders   CARDIAC EVENT MONITOR   MYOCARDIAL PERFUSION IMAGING     SOB (shortness of breath)       Relevant Medications   metoprolol  succinate (TOPROL  XL)  25 MG 24 hr tablet   lisinopril  (ZESTRIL ) 10 MG tablet   Other Relevant Orders   CARDIAC EVENT MONITOR   MYOCARDIAL PERFUSION IMAGING     Sinus bradycardia       Relevant Medications   metoprolol  succinate (TOPROL  XL) 25 MG 24 hr tablet   lisinopril  (ZESTRIL ) 10 MG tablet   Other Relevant Orders   CARDIAC EVENT MONITOR   MYOCARDIAL PERFUSION IMAGING     Syncope, unspecified syncope type       Has repeated syncope, echo was unremarkable. But need to r/o ischaemia as has CAD, do stress test. Also do monitor   Relevant Medications   metoprolol  succinate (TOPROL  XL) 25 MG 24 hr tablet   lisinopril  (ZESTRIL ) 10 MG tablet   Other Relevant Orders   CARDIAC EVENT MONITOR   MYOCARDIAL PERFUSION IMAGING  Disposition:   Return in about 4 weeks (around 12/11/2023) for stress test and holter and f/u.    Total time spent: 40 minutes  Signed,  Debborah Fairly, MD  11/13/2023 10:49 AM    [facility]

## 2023-11-16 ENCOUNTER — Encounter: Payer: Self-pay | Admitting: Urology

## 2023-11-16 ENCOUNTER — Ambulatory Visit (INDEPENDENT_AMBULATORY_CARE_PROVIDER_SITE_OTHER): Admitting: Urology

## 2023-11-16 ENCOUNTER — Other Ambulatory Visit: Payer: Self-pay | Admitting: Family

## 2023-11-16 VITALS — BP 141/62 | HR 64 | Ht 72.0 in | Wt 208.0 lb

## 2023-11-16 DIAGNOSIS — N393 Stress incontinence (female) (male): Secondary | ICD-10-CM | POA: Diagnosis not present

## 2023-11-16 DIAGNOSIS — Z87440 Personal history of urinary (tract) infections: Secondary | ICD-10-CM

## 2023-11-16 DIAGNOSIS — B964 Proteus (mirabilis) (morganii) as the cause of diseases classified elsewhere: Secondary | ICD-10-CM

## 2023-11-16 DIAGNOSIS — Z8546 Personal history of malignant neoplasm of prostate: Secondary | ICD-10-CM

## 2023-11-16 DIAGNOSIS — R109 Unspecified abdominal pain: Secondary | ICD-10-CM

## 2023-11-16 DIAGNOSIS — C61 Malignant neoplasm of prostate: Secondary | ICD-10-CM

## 2023-11-16 MED ORDER — CEFUROXIME AXETIL 250 MG PO TABS: 250.0000 mg | ORAL_TABLET | Freq: Two times a day (BID) | ORAL | 0 refills | Status: AC

## 2023-11-16 NOTE — Addendum Note (Signed)
 Addended by: Natha Bair on: 11/16/2023 11:42 AM   Modules accepted: Orders

## 2023-11-16 NOTE — Progress Notes (Signed)
 11/16/23 11:23 AM   [name] [name] 1936-06-10 [id number]  Referring provider:  Trenda Frisk, FNP [address] [place],  [place] [address]  Urological history Prostate cancer  -PSA (09/2023) 0.01 -s/p perineal prostatectomy in 1995  2. Urinary incontinence, male stress incontinence - Referred to physical therapy  - Managed with incontinence clamp on active days/condom catheters  HPI: [name] [name] is a 88 y.o.male who presents today to discuss CT scan results.  Previous records reviewed.   At his visit on 10/05/2023, He had abdominal MRI for pancreatic cyst in February and no abnormalities were noted within the kidneys.  He was diagnosed with a Proteus UTI on September 28, 2023 and treated with culture appropriate antibiotics.  He states he has been on Cipro  for 3 days and he has not gotten any better.  His symptoms started 6 to 8 weeks ago with left groin pain.  He attributed to a possible muscle strain but then for the last 3 to 4 weeks the pain has been radiating up into the left flank.  1 week ago he started experiencing dysuria.  He cannot lay on his left side or his back.  The pain is also exasperated by rising to a standing position.  He continues to have baseline urinary frequency of every 2 hours and getting up 3-4 times a night to urinate.  Patient denies any modifying or aggravating factors.  Patient denies any recent UTI's or gross hematuria.   Patient denies any fevers, chills, nausea or vomiting.   PVR 5 mL.  PSA 0.01.   He renal stone study completed October 22, 2023 did not demonstrate any hydronephrosis or nephrolithiasis or renal mass.  He had a right renal cyst.  There was no findings to explain his left-sided pain.  His flank pain has abated and he thinks it may have been due to his multiple falls.  Patient denies any modifying or aggravating factors.  Patient denies any recent UTI's, gross hematuria, dysuria or suprapubic/flank pain.  Patient denies any fevers, chills,  nausea or vomiting.     PMH: Past Medical History:  Diagnosis Date   Arthritis    Benign neoplasm of colon 04/13/2008   Overview:  Benign Polyps Of The Large Intestine - colonoscopy 2006,dr Mannie Seek of this note might be different from the original. Formatting of this note might be different from the original. Benign Polyps Of The Large Intestine - colonoscopy 2006,dr Mannie Seek of this note might be different from the original. Overview: Benign Polyps Of The Large Intestine - colonoscopy 2006,dr    Blurring of visual image 05/28/2018   Cancer (HCC) 1995   prostate ca    Chest pain 07/27/2016   Chest pain, unspecified 07/27/2016   Coronary artery disease    Esophageal stricture 08/2016   GERD (gastroesophageal reflux disease)    Glaucoma    History of hiatal hernia    Hx of CABG 02/24/2018   Formatting of this note might be different from the original. Two-vessel bypass. Formatting of this note might be different from the original. Formatting of this note might be different from the original. Two-vessel bypass. Formatting of this note might be different from the original. Formatting of this note might be different from the original. Two-vessel bypass.   Hypertension    Malignant neoplasm of prostate (HCC) 04/23/2017   MI (mitral incompetence)    MI, acute, non ST segment elevation (HCC) 10/05/2012   Formatting of this note might be different  from the original. Note: Unchanged - x4   Old myocardial infarction 05/01/2019   Note: Unchanged - x4   Orthopedic aftercare 10/08/2017   Peripheral neuropathy    Peripheral vascular disease (HCC)    S/P shoulder replacement, left 09/25/2017   Skin sensation disturbance 06/23/2007   Formatting of this note might be different from the original. Tingling (Paresthesia) Formatting of this note might be different from the original. Formatting of this note might be different from the original. Tingling (Paresthesia)   Tear of  meniscus of knee 11/11/2013   Formatting of this note might be different from the original. Formatting of this note might be different from the original. ICD-10 cut over Formatting of this note might be different from the original. Overview: ICD-10 cut over   Vertigo     Surgical History: Past Surgical History:  Procedure Laterality Date   COLONOSCOPY     COLONOSCOPY WITH PROPOFOL  N/A 03/27/2023   Procedure: COLONOSCOPY WITH PROPOFOL ;  Surgeon: Alvis Jourdain, MD;  Location: [facility];  Service: Gastroenterology;  Laterality: N/A;   CORONARY ANGIOPLASTY WITH STENT PLACEMENT  2005   x9    CORONARY ARTERY BYPASS GRAFT  2011   CORONARY STENT PLACEMENT  07/2014   ESOPHAGOGASTRODUODENOSCOPY     ESOPHAGOGASTRODUODENOSCOPY (EGD) WITH ESOPHAGEAL DILATION  2018   knee scope Bilateral    LEG SURGERY Right 09/2018   LIPOMA EXCISION N/A 09/24/2016   Procedure: EXCISION SUBCUTANEOUS LIPOMA ON UPPER BACK;  Surgeon: Dareen Ebbing, MD;  Location: [facility] OR;  Service: General;  Laterality: N/A;   POLYPECTOMY  03/27/2023   Procedure: POLYPECTOMY;  Surgeon: Alvis Jourdain, MD;  Location: [facility];  Service: Gastroenterology;;   Prostectomy  1995   REVERSE SHOULDER ARTHROPLASTY Left 09/25/2017   REVERSE SHOULDER ARTHROPLASTY Left 09/25/2017   Procedure: LEFT REVERSE SHOULDER ARTHROPLASTY; deltoid repair;  Surgeon: Winston Hawking, MD;  Location: [facility] OR;  Service: Orthopedics;  Laterality: Left;   ROTATOR CUFF REPAIR Left 2001    Home Medications:  Allergies as of 11/16/2023       Reactions   Docosahexaenoic Acid (dha) (fish) Diarrhea   Lovaza [omega-3-acid Ethyl Esters (fish)] Diarrhea   Atorvastatin Other (See Comments), Nausea Only   Joint pain   Hydrocodone Rash, Itching, Other (See Comments)   Ranolazine Diarrhea   Rosuvastatin Nausea Only, Other (See Comments)   Joint pain Other reaction(s): Unknown Joint pain    Joint pain  Other reaction(s): Unknown   Statins Other (See Comments)   Leg pain     Folic Acid    Note: anxiety itching and nausea   Folic Acid-vit B6-vit B12 Anxiety, Itching, Nausea Only   folic acid / vitamin B12 / vitamin B6   Pravastatin Nausea Only, Other (See Comments)        Medication List        Accurate as of Nov 16, 2023 11:23 AM. If you have any questions, ask your nurse or doctor.          STOP taking these medications    ofloxacin  0.3 % OTIC solution Commonly known as: FLOXIN        TAKE these medications    acetaminophen  650 MG CR tablet Commonly known as: TYLENOL  Take 650 mg by mouth as needed for pain. 2 tablets in the morning and 2 tablets at bedtime   amLODipine  10 MG tablet Commonly known as: NORVASC  Take 1 tablet (10 mg total) by mouth daily.   aspirin  EC 81 MG tablet Take 81 mg by  mouth daily.   azelastine  0.05 % ophthalmic solution Commonly known as: OPTIVAR  USE ONE DROP IN BOTH EYES TWICE DAILY   calcium  carbonate 500 MG chewable tablet Commonly known as: TUMS - dosed in mg elemental calcium  Chew 2 tablets by mouth daily as needed for indigestion or heartburn.   cefUROXime  250 MG tablet Commonly known as: CEFTIN  Take 1 tablet (250 mg total) by mouth 2 (two) times daily with a meal for 7 days.   celecoxib 200 MG capsule Commonly known as: CELEBREX TAKE 1 CAPSULE BY MOUTH ONCE DAILY AT NOON   cholecalciferol  1000 units tablet Commonly known as: VITAMIN D  Take 1,000 Units by mouth daily.   cycloSPORINE  0.05 % ophthalmic emulsion Commonly known as: RESTASIS  Place 1 drop into both eyes 2 (two) times daily.   fenofibrate  145 MG tablet Commonly known as: TRICOR  TAKE 1 TABLET BY MOUTH ONCE EVERY EVENING   fluticasone 50 MCG/ACT nasal spray Commonly known as: FLONASE Place 1-2 sprays into both nostrils as needed.   furosemide  20 MG tablet Commonly known as: Lasix  Take 1 tablet (20 mg total) by mouth daily.   ibuprofen 600 MG tablet Commonly known as: ADVIL Take by mouth.   latanoprost  0.005 %  ophthalmic solution Commonly known as: XALATAN  PLACE 1 DROP INTO AFFECTED EYE ONCE EVERY EVENING FOR GLUCOMA   levocetirizine 5 MG tablet Commonly known as: XYZAL  Take 1 tablet (5 mg total) by mouth every evening.   lisinopril  10 MG tablet Commonly known as: Zestril  Take 1 tablet (10 mg total) by mouth daily.   meclizine 12.5 MG tablet Commonly known as: ANTIVERT Take 1 tablet by mouth daily.   metoprolol  succinate 25 MG 24 hr tablet Commonly known as: Toprol  XL Take 0.5 tablets (12.5 mg total) by mouth daily.   nitroGLYCERIN  0.4 MG SL tablet Commonly known as: NITROSTAT  Place 1 tablet (0.4 mg total) under the tongue every 5 (five) minutes as needed for chest pain.   nystatin-triamcinolone  cream Commonly known as: MYCOLOG II APPLY TO AFFECTED AREA(s) TWICE DAILY   pantoprazole  40 MG tablet Commonly known as: PROTONIX  TAKE 1 TABLET BY MOUTH TWICE DAILY   pregabalin  75 MG capsule Commonly known as: Lyrica  Take 1 capsule (75 mg total) by mouth 2 (two) times daily.   Repatha  SureClick 140 MG/ML Soaj Generic drug: Evolocumab  INJECT THE contents of ONE pen into THE SKIN ONCE every 14 DAYS   SYSTANE OP Apply 1 drop to eye 5 (five) times daily as needed (dry eyes).   zolpidem  10 MG tablet Commonly known as: AMBIEN  TAKE ONE TABLET BY MOUTH EVERYDAY AT BEDTIME FOR INSOMNIA        Allergies:  Allergies  Allergen Reactions   Docosahexaenoic Acid (Dha) (Fish) Diarrhea   Lovaza [Omega-3-Acid Ethyl Esters (Fish)] Diarrhea   Atorvastatin Other (See Comments) and Nausea Only    Joint pain   Hydrocodone Rash, Itching and Other (See Comments)   Ranolazine Diarrhea   Rosuvastatin Nausea Only and Other (See Comments)    Joint pain  Other reaction(s): Unknown  Joint pain    Joint pain  Other reaction(s): Unknown   Statins Other (See Comments)    Leg pain    Folic Acid     Note: anxiety itching and nausea   Folic Acid-Vit B6-Vit B12 Anxiety, Itching and Nausea Only     folic acid / vitamin B12 / vitamin B6   Pravastatin Nausea Only and Other (See Comments)    Family History: Family History  Problem  Relation Age of Onset   Alzheimer's disease Mother    Stroke Father    Parkinson's disease Sister    Cancer Sister    Obesity Sister    Diabetes Mellitus II Sister     Social History:  reports that he has never smoked. He has never been exposed to tobacco smoke. He has never used smokeless tobacco. He reports that he does not currently use alcohol  after a past usage of about 7.0 standard drinks of alcohol  per week. He reports that he does not use drugs.   Physical Exam: BP (!) 141/62   Pulse 64   Ht 6' (1.829 m) Comment: per patient  Wt 208 lb (94.3 kg)   BMI 28.21 kg/m   Constitutional:  Well nourished. Alert and oriented, No acute distress. HEENT: [place] AT, moist mucus membranes.  Trachea midline Cardiovascular: No clubbing, cyanosis, or edema. Respiratory: Normal respiratory effort, no increased work of breathing. Neurologic: Grossly intact, no focal deficits, moving all 4 extremities. Psychiatric: Normal mood and affect.   Laboratory Data: Results for orders placed or performed during the hospital encounter of 10/05/23  PSA   Collection Time: 10/05/23  3:43 PM  Result Value Ref Range   Prostatic Specific Antigen 0.01 0.00 - 4.00 ng/mL  I have reviewed the labs.  See HPI.     Pertinent Imaging: CLINICAL DATA:  Flank pain.   EXAM: CT ABDOMEN AND PELVIS WITHOUT CONTRAST   TECHNIQUE: Multidetector CT imaging of the abdomen and pelvis was performed following the standard protocol without IV contrast.   RADIATION DOSE REDUCTION: This exam was performed according to the departmental dose-optimization program which includes automated exposure control, adjustment of the mA and/or kV according to patient size and/or use of iterative reconstruction technique.   COMPARISON:  07/06/2020   FINDINGS: Lower chest: No acute abnormality. No  pleural or pericardial effusions. Atheromatous calcifications aorta and coronary arteries.   Hepatobiliary: No focal liver abnormality is seen. No gallstones, gallbladder wall thickening, or biliary dilatation.   Pancreas: Unremarkable. No pancreatic ductal dilatation or surrounding inflammatory changes.   Spleen: Normal in size without focal abnormality.   Adrenals/Urinary Tract: No adrenal lesions. Right kidney lesion consistent with a cyst measures 3.6 cm. No hydronephrosis or nephrolithiasis. Urinary bladder nearly empty and not well evaluated.   Stomach/Bowel: Stomach is within normal limits. Appendix appears normal. No evidence of bowel wall thickening, distention, or inflammatory changes. Diverticula in the sigmoid.   Vascular/Lymphatic: No significant vascular findings are present. No enlarged abdominal or pelvic lymph nodes.   Reproductive: Postop changes prostatectomy   Other: No abdominal wall hernia or abnormality. No abdominopelvic ascites.   Musculoskeletal: Thoracolumbosacral degenerative changes.   IMPRESSION: 1. Right kidney cyst. 2. Diverticulosis. 3. No acute abdominal or pelvic pathology identified. 4. Aortic atherosclerosis (ICD10-I70.0).     Electronically Signed   By: Sydell Eva M.D.   On: 11/07/2023 21:04  I have independently reviewed the films.  See HPI.    Assessment & Plan:    1. Prostate cancer - PSA undetectable  2. Urinary incontinence  - PVR demonstrates adequate emptying - Managed with incontinent pads and pelvic floor exercises  3. Proteus UTI - Resolved -He is concerned as he feels he is getting more urinary tract infections than usual, he does wear incontinent pads and changes in when he can, but sometimes when he is out in the community he is not able to change the pad is readily -He would like to have an antibiotic  on hand in case he develops signs of infection as he is having more more difficulty getting around due to  his peripheral neuropathy, vertigo and macular degeneration -I have sent a prescription for Ceftin  250 mg twice daily to have on hand in case symptoms of UTI develop in the future  4. Left flank pain -CT renal stone study did not identify an etiology for his left-sided pain  Return in about 1 year (around 11/15/2024) for PSA, I PSS .  [facility] [address] [place], [place] [address] [phone]

## 2023-11-23 ENCOUNTER — Ambulatory Visit (INDEPENDENT_AMBULATORY_CARE_PROVIDER_SITE_OTHER)

## 2023-11-23 DIAGNOSIS — R001 Bradycardia, unspecified: Secondary | ICD-10-CM

## 2023-11-23 DIAGNOSIS — I25728 Atherosclerosis of autologous artery coronary artery bypass graft(s) with other forms of angina pectoris: Secondary | ICD-10-CM | POA: Diagnosis not present

## 2023-11-23 DIAGNOSIS — E782 Mixed hyperlipidemia: Secondary | ICD-10-CM

## 2023-11-23 DIAGNOSIS — R55 Syncope and collapse: Secondary | ICD-10-CM | POA: Diagnosis not present

## 2023-11-23 DIAGNOSIS — R0602 Shortness of breath: Secondary | ICD-10-CM | POA: Diagnosis not present

## 2023-11-23 DIAGNOSIS — I10 Essential (primary) hypertension: Secondary | ICD-10-CM

## 2023-11-23 MED ORDER — TECHNETIUM TC 99M SESTAMIBI GENERIC - CARDIOLITE
10.0000 | Freq: Once | INTRAVENOUS | Status: AC | PRN
  Administered 2023-11-23: 10 via INTRAVENOUS

## 2023-11-23 MED ORDER — TECHNETIUM TC 99M SESTAMIBI GENERIC - CARDIOLITE
30.0000 | Freq: Once | INTRAVENOUS | Status: AC | PRN
  Administered 2023-11-23: 30 via INTRAVENOUS

## 2023-11-25 ENCOUNTER — Ambulatory Visit

## 2023-11-30 ENCOUNTER — Ambulatory Visit: Admitting: Family

## 2023-11-30 ENCOUNTER — Other Ambulatory Visit: Payer: Self-pay | Admitting: Family

## 2023-11-30 ENCOUNTER — Encounter: Payer: Self-pay | Admitting: Family

## 2023-11-30 DIAGNOSIS — E538 Deficiency of other specified B group vitamins: Secondary | ICD-10-CM

## 2023-11-30 MED ORDER — MECLIZINE HCL 12.5 MG PO TABS: 12.5000 mg | ORAL_TABLET | Freq: Every day | ORAL | 2 refills | Status: AC

## 2023-11-30 MED ORDER — CYANOCOBALAMIN 1000 MCG/ML IJ SOLN
1000.0000 ug | Freq: Once | INTRAMUSCULAR | Status: AC
Start: 2023-11-30 — End: 2023-11-30
  Administered 2023-11-30: 1000 ug via INTRAMUSCULAR

## 2023-11-30 NOTE — Progress Notes (Signed)
   CHIEF COMPLAINT  B12 Shot     REASON FOR VISIT  B12 Injection     ASSESSMENT  B12 Deficiency, Unspecified     PLAN  Diagnoses and all orders for this visit:  Vitamin B12 deficiency -     cyanocobalamin  (VITAMIN B12) injection 1,000 mcg     Pt. given B12 injection in clinic.  Return for next injection per provider instructions.   Total time spent: 5 minutes  Trenda Frisk, FNP  11/30/2023

## 2023-12-07 ENCOUNTER — Other Ambulatory Visit: Payer: Self-pay | Admitting: Family

## 2023-12-12 ENCOUNTER — Other Ambulatory Visit: Payer: Self-pay | Admitting: Cardiovascular Disease

## 2023-12-14 ENCOUNTER — Encounter: Payer: Self-pay | Admitting: Cardiovascular Disease

## 2023-12-14 ENCOUNTER — Ambulatory Visit: Admitting: Cardiovascular Disease

## 2023-12-14 VITALS — BP 115/72 | HR 72 | Ht 72.0 in | Wt 210.8 lb

## 2023-12-14 DIAGNOSIS — R001 Bradycardia, unspecified: Secondary | ICD-10-CM

## 2023-12-14 DIAGNOSIS — R0602 Shortness of breath: Secondary | ICD-10-CM | POA: Diagnosis not present

## 2023-12-14 DIAGNOSIS — E782 Mixed hyperlipidemia: Secondary | ICD-10-CM | POA: Diagnosis not present

## 2023-12-14 DIAGNOSIS — R0789 Other chest pain: Secondary | ICD-10-CM

## 2023-12-14 DIAGNOSIS — I10 Essential (primary) hypertension: Secondary | ICD-10-CM

## 2023-12-14 MED ORDER — RANOLAZINE ER 500 MG PO TB12: 500.0000 mg | ORAL_TABLET | Freq: Two times a day (BID) | ORAL | 2 refills | Status: DC

## 2023-12-14 NOTE — Progress Notes (Signed)
 Cardiology Office Note   Date:  12/14/2023   ID:  Douglass Gelineau, DOB 11/05/1935, MRN [id number]  PCP:  Trenda Frisk, FNP  Cardiologist:  Debborah Fairly, MD      History of Present Illness: [name] [name] is a 88 y.o. male who presents for  Chief Complaint  Patient presents with   Follow-up    1 month NST results    Doing well      Past Medical History:  Diagnosis Date   Arthritis    Benign neoplasm of colon 04/13/2008   Overview:  Benign Polyps Of The Large Intestine - colonoscopy 2006,dr Mannie Seek of this note might be different from the original. Formatting of this note might be different from the original. Benign Polyps Of The Large Intestine - colonoscopy 2006,dr Mannie Seek of this note might be different from the original. Overview: Benign Polyps Of The Large Intestine - colonoscopy 2006,dr    Blurring of visual image 05/28/2018   Cancer (HCC) 1995   prostate ca    Chest pain 07/27/2016   Chest pain, unspecified 07/27/2016   Coronary artery disease    Esophageal stricture 08/2016   GERD (gastroesophageal reflux disease)    Glaucoma    History of hiatal hernia    Hx of CABG 02/24/2018   Formatting of this note might be different from the original. Two-vessel bypass. Formatting of this note might be different from the original. Formatting of this note might be different from the original. Two-vessel bypass. Formatting of this note might be different from the original. Formatting of this note might be different from the original. Two-vessel bypass.   Hypertension    Malignant neoplasm of prostate (HCC) 04/23/2017   MI (mitral incompetence)    MI, acute, non ST segment elevation (HCC) 10/05/2012   Formatting of this note might be different from the original. Note: Unchanged - x4   Old myocardial infarction 05/01/2019   Note: Unchanged - x4   Orthopedic aftercare 10/08/2017   Peripheral neuropathy    Peripheral vascular disease (HCC)    S/P  shoulder replacement, left 09/25/2017   Skin sensation disturbance 06/23/2007   Formatting of this note might be different from the original. Tingling (Paresthesia) Formatting of this note might be different from the original. Formatting of this note might be different from the original. Tingling (Paresthesia)   Tear of meniscus of knee 11/11/2013   Formatting of this note might be different from the original. Formatting of this note might be different from the original. ICD-10 cut over Formatting of this note might be different from the original. Overview: ICD-10 cut over   Vertigo      Past Surgical History:  Procedure Laterality Date   COLONOSCOPY     COLONOSCOPY WITH PROPOFOL  N/A 03/27/2023   Procedure: COLONOSCOPY WITH PROPOFOL ;  Surgeon: Alvis Jourdain, MD;  Location: [facility];  Service: Gastroenterology;  Laterality: N/A;   CORONARY ANGIOPLASTY WITH STENT PLACEMENT  2005   x9    CORONARY ARTERY BYPASS GRAFT  2011   CORONARY STENT PLACEMENT  07/2014   ESOPHAGOGASTRODUODENOSCOPY     ESOPHAGOGASTRODUODENOSCOPY (EGD) WITH ESOPHAGEAL DILATION  2018   knee scope Bilateral    LEG SURGERY Right 09/2018   LIPOMA EXCISION N/A 09/24/2016   Procedure: EXCISION SUBCUTANEOUS LIPOMA ON UPPER BACK;  Surgeon: Dareen Ebbing, MD;  Location: [facility] OR;  Service: General;  Laterality: N/A;   POLYPECTOMY  03/27/2023   Procedure: POLYPECTOMY;  Surgeon: Alvis Jourdain, MD;  Location: [facility];  Service: Gastroenterology;;   Prostectomy  1995   REVERSE SHOULDER ARTHROPLASTY Left 09/25/2017   REVERSE SHOULDER ARTHROPLASTY Left 09/25/2017   Procedure: LEFT REVERSE SHOULDER ARTHROPLASTY; deltoid repair;  Surgeon: Winston Hawking, MD;  Location: [facility] OR;  Service: Orthopedics;  Laterality: Left;   ROTATOR CUFF REPAIR Left 2001     Current Outpatient Medications  Medication Sig Dispense Refill   acetaminophen  (TYLENOL ) 650 MG CR tablet Take 650 mg by mouth as needed for pain. 2 tablets in the morning and 2  tablets at bedtime     amLODipine  (NORVASC ) 10 MG tablet Take 1 tablet (10 mg total) by mouth daily. 90 tablet 3   aspirin  EC 81 MG tablet Take 81 mg by mouth daily.     azelastine  (OPTIVAR ) 0.05 % ophthalmic solution USE ONE DROP IN BOTH EYES TWICE DAILY 6 mL 5   calcium  carbonate (TUMS - DOSED IN MG ELEMENTAL CALCIUM ) 500 MG chewable tablet Chew 2 tablets by mouth daily as needed for indigestion or heartburn.     celecoxib (CELEBREX) 200 MG capsule TAKE 1 CAPSULE BY MOUTH ONCE DAILY AT NOON 30 capsule 5   cholecalciferol  (VITAMIN D ) 1000 units tablet Take 1,000 Units by mouth daily.     cycloSPORINE  (RESTASIS ) 0.05 % ophthalmic emulsion Place 1 drop into both eyes 2 (two) times daily.     fenofibrate  (TRICOR ) 145 MG tablet TAKE 1 TABLET BY MOUTH ONCE EVERY EVENING 90 tablet 3   fluticasone (FLONASE) 50 MCG/ACT nasal spray Place 1-2 sprays into both nostrils as needed.     furosemide  (LASIX ) 20 MG tablet Take 1 tablet (20 mg total) by mouth daily. 30 tablet 11   ibuprofen (ADVIL) 600 MG tablet Take by mouth.     latanoprost  (XALATAN ) 0.005 % ophthalmic solution PLACE 1 DROP INTO AFFECTED EYE ONCE EVERY EVENING FOR GLUCOMA 2.5 mL 5   levocetirizine (XYZAL ) 5 MG tablet TAKE 1 TABLET BY MOUTH ONCE EVERY EVENING 90 tablet 1   lisinopril  (ZESTRIL ) 10 MG tablet Take 1 tablet (10 mg total) by mouth daily. 30 tablet 2   meclizine  (ANTIVERT ) 12.5 MG tablet Take 1 tablet (12.5 mg total) by mouth daily. 30 tablet 2   metoprolol  succinate (TOPROL  XL) 25 MG 24 hr tablet Take 0.5 tablets (12.5 mg total) by mouth daily. 15 tablet 11   nitroGLYCERIN  (NITROSTAT ) 0.4 MG SL tablet Place 1 tablet (0.4 mg total) under the tongue every 5 (five) minutes as needed for chest pain. 30 tablet 3   nystatin-triamcinolone  (MYCOLOG II) cream APPLY TO AFFECTED AREA(s) TWICE DAILY 60 g 5   pantoprazole  (PROTONIX ) 40 MG tablet TAKE 1 TABLET BY MOUTH TWICE DAILY 60 tablet 6   Polyethyl Glycol-Propyl Glycol (SYSTANE OP) Apply 1  drop to eye 5 (five) times daily as needed (dry eyes).     pregabalin  (LYRICA ) 75 MG capsule TAKE 1 CAPSULE BY MOUTH TWICE DAILY 60 capsule 2   ranolazine (RANEXA) 500 MG 12 hr tablet Take 1 tablet (500 mg total) by mouth 2 (two) times daily. 60 tablet 2   zolpidem  (AMBIEN ) 10 MG tablet TAKE ONE TABLET BY MOUTH EVERYDAY AT BEDTIME FOR INSOMNIA 30 tablet 3   REPATHA  SURECLICK 140 MG/ML SOAJ INJECT THE CONTENTS OF 1 PEN SUBCUTANEOUSLY EVERY 14 DAYS 6 mL 2   No current facility-administered medications for this visit.    Allergies:   Docosahexaenoic acid (dha) (fish), Lovaza [omega-3-acid ethyl esters (fish)], Atorvastatin, Hydrocodone, Ranolazine, Rosuvastatin, Statins, Folic acid, Folic  acid-vit b6-vit b12, and Pravastatin    Social History:   reports that he has never smoked. He has never been exposed to tobacco smoke. He has never used smokeless tobacco. He reports that he does not currently use alcohol  after a past usage of about 7.0 standard drinks of alcohol  per week. He reports that he does not use drugs.   Family History:  family history includes Alzheimer's disease in his mother; Cancer in his sister; Diabetes Mellitus II in his sister; Obesity in his sister; Parkinson's disease in his sister; Stroke in his father.    ROS:     Review of Systems  Constitutional: Negative.   HENT: Negative.    Eyes: Negative.   Respiratory: Negative.    Gastrointestinal: Negative.   Genitourinary: Negative.   Musculoskeletal: Negative.   Skin: Negative.   Neurological: Negative.   Endo/Heme/Allergies: Negative.   Psychiatric/Behavioral: Negative.    All other systems reviewed and are negative.     All other systems are reviewed and negative.    PHYSICAL EXAM: VS:  BP 115/72   Pulse 72   Ht 6' (1.829 m)   Wt 210 lb 12.8 oz (95.6 kg)   SpO2 94%   BMI 28.59 kg/m  , BMI Body mass index is 28.59 kg/m. Last weight:  Wt Readings from Last 3 Encounters:  12/14/23 210 lb 12.8 oz (95.6 kg)   11/16/23 208 lb (94.3 kg)  11/13/23 210 lb (95.3 kg)     Physical Exam Vitals reviewed.  Constitutional:      Appearance: Normal appearance. He is normal weight.  HENT:     Head: Normocephalic.     Nose: Nose normal.     Mouth/Throat:     Mouth: Mucous membranes are moist.  Eyes:     Pupils: Pupils are equal, round, and reactive to light.  Cardiovascular:     Rate and Rhythm: Normal rate and regular rhythm.     Pulses: Normal pulses.     Heart sounds: Normal heart sounds.  Pulmonary:     Effort: Pulmonary effort is normal.  Abdominal:     General: Abdomen is flat. Bowel sounds are normal.  Musculoskeletal:        General: Normal range of motion.     Cervical back: Normal range of motion.  Skin:    General: Skin is warm.  Neurological:     General: No focal deficit present.     Mental Status: He is alert.  Psychiatric:        Mood and Affect: Mood normal.       EKG:   Recent Labs: 09/28/2023: ALT 10; BUN 45; Creatinine, Ser 1.35; Hemoglobin 14.1; Platelets 195; Potassium 4.6; Sodium 141; TSH 0.448    Lipid Panel    Component Value Date/Time   CHOL 103 09/28/2023 1150   TRIG 113 09/28/2023 1150   HDL 49 09/28/2023 1150   CHOLHDL 2.1 09/28/2023 1150   LDLCALC 33 09/28/2023 1150   LDLDIRECT 62 02/18/2021 0808      Other studies Reviewed: Additional studies/ records that were reviewed today include:  Review of the above records demonstrates:       No data to display            ASSESSMENT AND PLAN:    ICD-10-CM   1. Other chest pain  R07.89 ranolazine (RANEXA) 500 MG 12 hr tablet   stress test showed anteroapical ischaemia, try ranexa. If still CP will do cath    2.  Sinus bradycardia  R00.1 ranolazine (RANEXA) 500 MG 12 hr tablet    3. Mixed hyperlipidemia  E78.2 ranolazine (RANEXA) 500 MG 12 hr tablet    4. Essential hypertension  I10 ranolazine (RANEXA) 500 MG 12 hr tablet    5. SOB (shortness of breath)  R06.02 ranolazine (RANEXA) 500 MG  12 hr tablet       Problem List Items Addressed This Visit       Cardiovascular and Mediastinum   Essential hypertension   Relevant Medications   ranolazine (RANEXA) 500 MG 12 hr tablet     Other   Hyperlipidemia   Relevant Medications   ranolazine (RANEXA) 500 MG 12 hr tablet   Other Visit Diagnoses       Other chest pain    -  Primary   stress test showed anteroapical ischaemia, try ranexa. If still CP will do cath   Relevant Medications   ranolazine (RANEXA) 500 MG 12 hr tablet     Sinus bradycardia       Relevant Medications   ranolazine (RANEXA) 500 MG 12 hr tablet     SOB (shortness of breath)       Relevant Medications   ranolazine (RANEXA) 500 MG 12 hr tablet          Disposition:   Return in about 2 weeks (around 12/28/2023).    Total time spent: 30 minutes  Signed,  Debborah Fairly, MD  12/14/2023 10:52 AM    [facility]

## 2023-12-23 DIAGNOSIS — H353211 Exudative age-related macular degeneration, right eye, with active choroidal neovascularization: Secondary | ICD-10-CM | POA: Diagnosis not present

## 2023-12-29 LAB — GLUCOSE, POCT (MANUAL RESULT ENTRY): POC Glucose: 106 mg/dL — AB (ref 70–99)

## 2023-12-29 NOTE — Congregational Nurse Program (Signed)
  Dept: [phone]   Congregational Nurse Program Note  Date of Encounter: 12/29/2023  Past Medical History: Past Medical History:  Diagnosis Date   Arthritis    Benign neoplasm of colon 04/13/2008   Overview:  Benign Polyps Of The Large Intestine - colonoscopy 2006,dr Mannie Seek of this note might be different from the original. Formatting of this note might be different from the original. Benign Polyps Of The Large Intestine - colonoscopy 2006,dr Mannie Seek of this note might be different from the original. Overview: Benign Polyps Of The Large Intestine - colonoscopy 2006,dr    Blurring of visual image 05/28/2018   Cancer (HCC) 1995   prostate ca    Chest pain 07/27/2016   Chest pain, unspecified 07/27/2016   Coronary artery disease    Esophageal stricture 08/2016   GERD (gastroesophageal reflux disease)    Glaucoma    History of hiatal hernia    Hx of CABG 02/24/2018   Formatting of this note might be different from the original. Two-vessel bypass. Formatting of this note might be different from the original. Formatting of this note might be different from the original. Two-vessel bypass. Formatting of this note might be different from the original. Formatting of this note might be different from the original. Two-vessel bypass.   Hypertension    Malignant neoplasm of prostate (HCC) 04/23/2017   MI (mitral incompetence)    MI, acute, non ST segment elevation (HCC) 10/05/2012   Formatting of this note might be different from the original. Note: Unchanged - x4   Old myocardial infarction 05/01/2019   Note: Unchanged - x4   Orthopedic aftercare 10/08/2017   Peripheral neuropathy    Peripheral vascular disease (HCC)    S/P shoulder replacement, left 09/25/2017   Skin sensation disturbance 06/23/2007   Formatting of this note might be different from the original. Tingling (Paresthesia) Formatting of this note might be different from the original. Formatting of  this note might be different from the original. Tingling (Paresthesia)   Tear of meniscus of knee 11/11/2013   Formatting of this note might be different from the original. Formatting of this note might be different from the original. ICD-10 cut over Formatting of this note might be different from the original. Overview: ICD-10 cut over   Vertigo     Encounter Details:  Community Questionnaire - 12/29/23 1308       Questionnaire   Ask client: Do you give verbal consent for me to treat you today? Yes    Student Assistance N/A    Location Patient Served  [facility]    Encounter Setting CN site    Population Status Unknown    Insurance Unknown    Insurance/Financial Assistance Referral N/A    Medication N/A    Medical Provider Yes    Screening Referrals Made N/A    Medical Referrals Made N/A    Medical Appointment Completed N/A    CNP Interventions Advocate/Support    Screenings CN Performed Blood Pressure;Blood Glucose    ED Visit Averted N/A    Life-Saving Intervention Made N/A          Today's Vitals   12/29/23 1308  BP: (!) 140/70   There is no height or weight on file to calculate BMI.  Patient is awaiting upcoming heart surgery over the next 2 weeks.  Patient encouraged to continue checking blood pressure and limiting salt intake.  Lalia Loudon,MSN, RN

## 2023-12-31 ENCOUNTER — Encounter: Payer: Self-pay | Admitting: Cardiovascular Disease

## 2023-12-31 ENCOUNTER — Ambulatory Visit: Admitting: Cardiovascular Disease

## 2023-12-31 VITALS — BP 129/70 | HR 65 | Ht 72.0 in | Wt 210.2 lb

## 2023-12-31 DIAGNOSIS — R0789 Other chest pain: Secondary | ICD-10-CM | POA: Diagnosis not present

## 2023-12-31 DIAGNOSIS — R0602 Shortness of breath: Secondary | ICD-10-CM

## 2023-12-31 DIAGNOSIS — R001 Bradycardia, unspecified: Secondary | ICD-10-CM | POA: Diagnosis not present

## 2023-12-31 DIAGNOSIS — E782 Mixed hyperlipidemia: Secondary | ICD-10-CM

## 2023-12-31 DIAGNOSIS — I25728 Atherosclerosis of autologous artery coronary artery bypass graft(s) with other forms of angina pectoris: Secondary | ICD-10-CM | POA: Diagnosis not present

## 2023-12-31 DIAGNOSIS — I10 Essential (primary) hypertension: Secondary | ICD-10-CM

## 2023-12-31 NOTE — Progress Notes (Signed)
 Cardiology Office Note   Date:  12/31/2023   ID:  [name] [name], DOB Dec 18, 1935, MRN [id number]  PCP:  Trenda Frisk, FNP  Cardiologist:  Debborah Fairly, MD      History of Present Illness: [name] [name] is a 88 y.o. male who presents for  Chief Complaint  Patient presents with   Follow-up    2 week follow up    Chest pain improved  with ranexa .      Past Medical History:  Diagnosis Date   Arthritis    Benign neoplasm of colon 04/13/2008   Overview:  Benign Polyps Of The Large Intestine - colonoscopy 2006,dr Mannie Seek of this note might be different from the original. Formatting of this note might be different from the original. Benign Polyps Of The Large Intestine - colonoscopy 2006,dr Mannie Seek of this note might be different from the original. Overview: Benign Polyps Of The Large Intestine - colonoscopy 2006,dr    Blurring of visual image 05/28/2018   Cancer (HCC) 1995   prostate ca    Chest pain 07/27/2016   Chest pain, unspecified 07/27/2016   Coronary artery disease    Esophageal stricture 08/2016   GERD (gastroesophageal reflux disease)    Glaucoma    History of hiatal hernia    Hx of CABG 02/24/2018   Formatting of this note might be different from the original. Two-vessel bypass. Formatting of this note might be different from the original. Formatting of this note might be different from the original. Two-vessel bypass. Formatting of this note might be different from the original. Formatting of this note might be different from the original. Two-vessel bypass.   Hypertension    Malignant neoplasm of prostate (HCC) 04/23/2017   MI (mitral incompetence)    MI, acute, non ST segment elevation (HCC) 10/05/2012   Formatting of this note might be different from the original. Note: Unchanged - x4   Old myocardial infarction 05/01/2019   Note: Unchanged - x4   Orthopedic aftercare 10/08/2017   Peripheral neuropathy    Peripheral vascular  disease (HCC)    S/P shoulder replacement, left 09/25/2017   Skin sensation disturbance 06/23/2007   Formatting of this note might be different from the original. Tingling (Paresthesia) Formatting of this note might be different from the original. Formatting of this note might be different from the original. Tingling (Paresthesia)   Tear of meniscus of knee 11/11/2013   Formatting of this note might be different from the original. Formatting of this note might be different from the original. ICD-10 cut over Formatting of this note might be different from the original. Overview: ICD-10 cut over   Vertigo      Past Surgical History:  Procedure Laterality Date   COLONOSCOPY     COLONOSCOPY WITH PROPOFOL  N/A 03/27/2023   Procedure: COLONOSCOPY WITH PROPOFOL ;  Surgeon: Alvis Jourdain, MD;  Location: [facility];  Service: Gastroenterology;  Laterality: N/A;   CORONARY ANGIOPLASTY WITH STENT PLACEMENT  2005   x9    CORONARY ARTERY BYPASS GRAFT  2011   CORONARY STENT PLACEMENT  07/2014   ESOPHAGOGASTRODUODENOSCOPY     ESOPHAGOGASTRODUODENOSCOPY (EGD) WITH ESOPHAGEAL DILATION  2018   knee scope Bilateral    LEG SURGERY Right 09/2018   LIPOMA EXCISION N/A 09/24/2016   Procedure: EXCISION SUBCUTANEOUS LIPOMA ON UPPER BACK;  Surgeon: Dareen Ebbing, MD;  Location: [facility] OR;  Service: General;  Laterality: N/A;   POLYPECTOMY  03/27/2023   Procedure: POLYPECTOMY;  Surgeon: Alvis Jourdain, MD;  Location: [facility];  Service: Gastroenterology;;   Prostectomy  1995   REVERSE SHOULDER ARTHROPLASTY Left 09/25/2017   REVERSE SHOULDER ARTHROPLASTY Left 09/25/2017   Procedure: LEFT REVERSE SHOULDER ARTHROPLASTY; deltoid repair;  Surgeon: Winston Hawking, MD;  Location: [facility] OR;  Service: Orthopedics;  Laterality: Left;   ROTATOR CUFF REPAIR Left 2001     Current Outpatient Medications  Medication Sig Dispense Refill   acetaminophen  (TYLENOL ) 650 MG CR tablet Take 650 mg by mouth as needed for pain. 2 tablets in  the morning and 2 tablets at bedtime     amLODipine  (NORVASC ) 10 MG tablet Take 1 tablet (10 mg total) by mouth daily. 90 tablet 3   aspirin  EC 81 MG tablet Take 81 mg by mouth daily.     azelastine  (OPTIVAR ) 0.05 % ophthalmic solution USE ONE DROP IN BOTH EYES TWICE DAILY 6 mL 5   calcium  carbonate (TUMS - DOSED IN MG ELEMENTAL CALCIUM ) 500 MG chewable tablet Chew 2 tablets by mouth daily as needed for indigestion or heartburn.     celecoxib (CELEBREX) 200 MG capsule TAKE 1 CAPSULE BY MOUTH ONCE DAILY AT NOON 30 capsule 5   cholecalciferol  (VITAMIN D ) 1000 units tablet Take 1,000 Units by mouth daily.     cycloSPORINE  (RESTASIS ) 0.05 % ophthalmic emulsion Place 1 drop into both eyes 2 (two) times daily.     fenofibrate  (TRICOR ) 145 MG tablet TAKE 1 TABLET BY MOUTH ONCE EVERY EVENING 90 tablet 3   fluticasone (FLONASE) 50 MCG/ACT nasal spray Place 1-2 sprays into both nostrils as needed.     furosemide  (LASIX ) 20 MG tablet Take 1 tablet (20 mg total) by mouth daily. 30 tablet 11   ibuprofen (ADVIL) 600 MG tablet Take by mouth.     latanoprost  (XALATAN ) 0.005 % ophthalmic solution PLACE 1 DROP INTO AFFECTED EYE ONCE EVERY EVENING FOR GLUCOMA 2.5 mL 5   levocetirizine (XYZAL ) 5 MG tablet TAKE 1 TABLET BY MOUTH ONCE EVERY EVENING 90 tablet 1   lisinopril  (ZESTRIL ) 10 MG tablet Take 1 tablet (10 mg total) by mouth daily. 30 tablet 2   meclizine  (ANTIVERT ) 12.5 MG tablet Take 1 tablet (12.5 mg total) by mouth daily. 30 tablet 2   metoprolol  succinate (TOPROL  XL) 25 MG 24 hr tablet Take 0.5 tablets (12.5 mg total) by mouth daily. 15 tablet 11   nitroGLYCERIN  (NITROSTAT ) 0.4 MG SL tablet Place 1 tablet (0.4 mg total) under the tongue every 5 (five) minutes as needed for chest pain. 30 tablet 3   nystatin-triamcinolone  (MYCOLOG II) cream APPLY TO AFFECTED AREA(s) TWICE DAILY 60 g 5   pantoprazole  (PROTONIX ) 40 MG tablet TAKE 1 TABLET BY MOUTH TWICE DAILY 60 tablet 6   Polyethyl Glycol-Propyl Glycol  (SYSTANE OP) Apply 1 drop to eye 5 (five) times daily as needed (dry eyes).     pregabalin  (LYRICA ) 75 MG capsule TAKE 1 CAPSULE BY MOUTH TWICE DAILY 60 capsule 2   ranolazine  (RANEXA ) 500 MG 12 hr tablet Take 1 tablet (500 mg total) by mouth 2 (two) times daily. 60 tablet 2   REPATHA  SURECLICK 140 MG/ML SOAJ INJECT THE CONTENTS OF 1 PEN SUBCUTANEOUSLY EVERY 14 DAYS 6 mL 2   zolpidem  (AMBIEN ) 10 MG tablet TAKE ONE TABLET BY MOUTH EVERYDAY AT BEDTIME FOR INSOMNIA 30 tablet 3   No current facility-administered medications for this visit.    Allergies:   Docosahexaenoic acid (dha) (fish), Lovaza [omega-3-acid ethyl esters (fish)], Atorvastatin, Hydrocodone, Ranolazine ,  Rosuvastatin, Statins, Folic acid, Folic acid-vit X9-JYN b12, and Pravastatin    Social History:   reports that he has never smoked. He has never been exposed to tobacco smoke. He has never used smokeless tobacco. He reports that he does not currently use alcohol  after a past usage of about 7.0 standard drinks of alcohol  per week. He reports that he does not use drugs.   Family History:  family history includes Alzheimer's disease in his mother; Cancer in his sister; Diabetes Mellitus II in his sister; Obesity in his sister; Parkinson's disease in his sister; Stroke in his father.    ROS:     Review of Systems  Constitutional: Negative.   HENT: Negative.    Eyes: Negative.   Respiratory: Negative.    Gastrointestinal: Negative.   Genitourinary: Negative.   Musculoskeletal: Negative.   Skin: Negative.   Neurological: Negative.   Endo/Heme/Allergies: Negative.   Psychiatric/Behavioral: Negative.    All other systems reviewed and are negative.     All other systems are reviewed and negative.    PHYSICAL EXAM: VS:  BP 129/70   Pulse 65   Ht 6' (1.829 m)   Wt 210 lb 3.2 oz (95.3 kg)   SpO2 93%   BMI 28.51 kg/m  , BMI Body mass index is 28.51 kg/m. Last weight:  Wt Readings from Last 3 Encounters:  12/31/23 210  lb 3.2 oz (95.3 kg)  12/14/23 210 lb 12.8 oz (95.6 kg)  11/16/23 208 lb (94.3 kg)     Physical Exam Vitals reviewed.  Constitutional:      Appearance: Normal appearance. He is normal weight.  HENT:     Head: Normocephalic.     Nose: Nose normal.     Mouth/Throat:     Mouth: Mucous membranes are moist.   Eyes:     Pupils: Pupils are equal, round, and reactive to light.    Cardiovascular:     Rate and Rhythm: Normal rate and regular rhythm.     Pulses: Normal pulses.     Heart sounds: Normal heart sounds.  Pulmonary:     Effort: Pulmonary effort is normal.  Abdominal:     General: Abdomen is flat. Bowel sounds are normal.   Musculoskeletal:        General: Normal range of motion.     Cervical back: Normal range of motion.   Skin:    General: Skin is warm.   Neurological:     General: No focal deficit present.     Mental Status: He is alert.   Psychiatric:        Mood and Affect: Mood normal.       EKG:   Recent Labs: 09/28/2023: ALT 10; BUN 45; Creatinine, Ser 1.35; Hemoglobin 14.1; Platelets 195; Potassium 4.6; Sodium 141; TSH 0.448    Lipid Panel    Component Value Date/Time   CHOL 103 09/28/2023 1150   TRIG 113 09/28/2023 1150   HDL 49 09/28/2023 1150   CHOLHDL 2.1 09/28/2023 1150   LDLCALC 33 09/28/2023 1150   LDLDIRECT 62 02/18/2021 0808      Other studies Reviewed: Additional studies/ records that were reviewed today include:  Review of the above records demonstrates:       No data to display            ASSESSMENT AND PLAN:    ICD-10-CM   1. Other chest pain  R07.89    Has anteroapical reversible defect with improvment with ranexa . Wants  to have cath now. But wait 2 weeks    2. Sinus bradycardia  R00.1     3. Mixed hyperlipidemia  E78.2     4. Essential hypertension  I10     5. SOB (shortness of breath)  R06.02     6. Coronary artery disease of autologous bypass graft with stable angina pectoris (HCC)  I25.728         Problem List Items Addressed This Visit       Cardiovascular and Mediastinum   Essential hypertension     Other   Hyperlipidemia   Other Visit Diagnoses       Other chest pain    -  Primary   Has anteroapical reversible defect with improvment with ranexa . Wants to have cath now. But wait 2 weeks     Sinus bradycardia         SOB (shortness of breath)         Coronary artery disease of autologous bypass graft with stable angina pectoris (HCC)              Disposition:   Return in about 2 weeks (around 01/14/2024).    Total time spent: 35 minutes  Signed,  Debborah Fairly, MD  12/31/2023 11:32 AM    [facility]

## 2024-01-01 ENCOUNTER — Telehealth: Payer: Self-pay

## 2024-01-01 NOTE — Telephone Encounter (Signed)
 Pt called and left vm regarding scheduling his cath. Following his appt yesterday I just wanted to clarify with you, if you want him to follow up in 2 weeks then schedule the cath? Please advise

## 2024-01-04 ENCOUNTER — Ambulatory Visit: Admitting: Family

## 2024-01-08 ENCOUNTER — Ambulatory Visit (INDEPENDENT_AMBULATORY_CARE_PROVIDER_SITE_OTHER): Admitting: Family

## 2024-01-08 ENCOUNTER — Encounter: Payer: Self-pay | Admitting: Family

## 2024-01-08 VITALS — BP 128/64 | HR 68 | Ht 72.0 in | Wt 210.6 lb

## 2024-01-08 DIAGNOSIS — E782 Mixed hyperlipidemia: Secondary | ICD-10-CM | POA: Diagnosis not present

## 2024-01-08 DIAGNOSIS — E538 Deficiency of other specified B group vitamins: Secondary | ICD-10-CM | POA: Diagnosis not present

## 2024-01-08 DIAGNOSIS — R7303 Prediabetes: Secondary | ICD-10-CM

## 2024-01-08 DIAGNOSIS — I10 Essential (primary) hypertension: Secondary | ICD-10-CM

## 2024-01-08 DIAGNOSIS — E559 Vitamin D deficiency, unspecified: Secondary | ICD-10-CM | POA: Diagnosis not present

## 2024-01-08 MED ORDER — CYANOCOBALAMIN 1000 MCG/ML IJ SOLN
1000.0000 ug | Freq: Once | INTRAMUSCULAR | Status: AC
  Administered 2024-01-08: 1000 ug via INTRAMUSCULAR

## 2024-01-08 NOTE — Patient Instructions (Signed)
 Bonine is the over the counter name for meclizine .

## 2024-01-08 NOTE — Progress Notes (Signed)
 Established Patient Office Visit  Subjective:  Patient ID: [name] [name], male    DOB: 11/05/1935  Age: 88 y.o. MRN: [id number]  Chief Complaint  Patient presents with   Follow-up    3 month follow up    Patient is here today for his 3 months follow up.  He has been feeling fairly well since last appointment.   He does have additional concerns to discuss today.   Fell 3 x in a week, he thinks might have been due to his heart.  He bent over to put a glass away, then passed out.  A few days later, he got up to go to the bathroom in the morning, fell again on the right, on his right elbow, left knee.  Was in the laundry room as week later, fell on the left knee again. He does already have appointment set up with ortho for his knee.   Had a cardiology appointment, says he found a blockage.  Needs cath, cardiologist appointment next week.   Labs are not due today. He needs refills.   I have reviewed his active problem list, medication list, allergies, notes from last encounter, lab results for his appointment today.      No other concerns at this time.   Past Medical History:  Diagnosis Date   Arthritis    Benign neoplasm of colon 04/13/2008   Overview:  Benign Polyps Of The Large Intestine - colonoscopy 2006,dr Graig Deal of this note might be different from the original. Formatting of this note might be different from the original. Benign Polyps Of The Large Intestine - colonoscopy 2006,dr Graig Deal of this note might be different from the original. Overview: Benign Polyps Of The Large Intestine - colonoscopy 2006,dr    Blurring of visual image 05/28/2018   Cancer (HCC) 1995   prostate ca    Chest pain 07/27/2016   Chest pain, unspecified 07/27/2016   Coronary artery disease    Esophageal stricture 08/2016   GERD (gastroesophageal reflux disease)    Glaucoma    History of hiatal hernia    Hx of CABG 02/24/2018   Formatting of this note might be  different from the original. Two-vessel bypass. Formatting of this note might be different from the original. Formatting of this note might be different from the original. Two-vessel bypass. Formatting of this note might be different from the original. Formatting of this note might be different from the original. Two-vessel bypass.   Hypertension    Malignant neoplasm of prostate (HCC) 04/23/2017   MI (mitral incompetence)    MI, acute, non ST segment elevation (HCC) 10/05/2012   Formatting of this note might be different from the original. Note: Unchanged - x4   Old myocardial infarction 05/01/2019   Note: Unchanged - x4   Orthopedic aftercare 10/08/2017   Peripheral neuropathy    Peripheral vascular disease (HCC)    S/P shoulder replacement, left 09/25/2017   Skin sensation disturbance 06/23/2007   Formatting of this note might be different from the original. Tingling (Paresthesia) Formatting of this note might be different from the original. Formatting of this note might be different from the original. Tingling (Paresthesia)   Tear of meniscus of knee 11/11/2013   Formatting of this note might be different from the original. Formatting of this note might be different from the original. ICD-10 cut over Formatting of this note might be different from the original. Overview: ICD-10 cut over   Vertigo  Past Surgical History:  Procedure Laterality Date   COLONOSCOPY     COLONOSCOPY WITH PROPOFOL  N/A 03/27/2023   Procedure: COLONOSCOPY WITH PROPOFOL ;  Surgeon: Rollin Dover, MD;  Location: [facility];  Service: Gastroenterology;  Laterality: N/A;   CORONARY ANGIOPLASTY WITH STENT PLACEMENT  2005   x9    CORONARY ARTERY BYPASS GRAFT  2011   CORONARY STENT PLACEMENT  07/2014   ESOPHAGOGASTRODUODENOSCOPY     ESOPHAGOGASTRODUODENOSCOPY (EGD) WITH ESOPHAGEAL DILATION  2018   knee scope Bilateral    LEG SURGERY Right 09/2018   LIPOMA EXCISION N/A 09/24/2016   Procedure: EXCISION  SUBCUTANEOUS LIPOMA ON UPPER BACK;  Surgeon: Donnice Lima, MD;  Location: [facility] OR;  Service: General;  Laterality: N/A;   POLYPECTOMY  03/27/2023   Procedure: POLYPECTOMY;  Surgeon: Rollin Dover, MD;  Location: [facility];  Service: Gastroenterology;;   Prostectomy  1995   REVERSE SHOULDER ARTHROPLASTY Left 09/25/2017   REVERSE SHOULDER ARTHROPLASTY Left 09/25/2017   Procedure: LEFT REVERSE SHOULDER ARTHROPLASTY; deltoid repair;  Surgeon: Kay Kemps, MD;  Location: [facility];  Service: Orthopedics;  Laterality: Left;   ROTATOR CUFF REPAIR Left 2001    Social History   Socioeconomic History   Marital status: Divorced    Spouse name: Not on file   Number of children: 6   Years of education: Not on file   Highest education level: Not on file  Occupational History   Not on file  Tobacco Use   Smoking status: Never    Passive exposure: Never   Smokeless tobacco: Never  Vaping Use   Vaping status: Never Used  Substance and Sexual Activity   Alcohol  use: Not Currently    Alcohol /week: 7.0 standard drinks of alcohol     Types: 7 Glasses of wine per week    Comment: wine with dinner   Drug use: No   Sexual activity: Not Currently  Other Topics Concern   Not on file  Social History Narrative   Not on file   Social Drivers of Health   Financial Resource Strain: Low Risk  (06/29/2023)   Overall Financial Resource Strain (CARDIA)    Difficulty of Paying Living Expenses: Not hard at all  Food Insecurity: No Food Insecurity (06/29/2023)   Hunger Vital Sign    Worried About Running Out of Food in the Last Year: Never true    Ran Out of Food in the Last Year: Never true  Transportation Needs: No Transportation Needs (06/29/2023)   PRAPARE - Administrator, Civil Service (Medical): No    Lack of Transportation (Non-Medical): No  Physical Activity: Sufficiently Active (06/29/2023)   Exercise Vital Sign    Days of Exercise per Week: 7 days    Minutes of Exercise per  Session: 30 min  Stress: No Stress Concern Present (06/29/2023)   [employer] of Occupational Health - Occupational Stress Questionnaire    Feeling of Stress : Not at all  Social Connections: Socially Isolated (06/29/2023)   Social Connection and Isolation Panel    Frequency of Communication with Friends and Family: More than three times a week    Frequency of Social Gatherings with Friends and Family: Three times a week    Attends Religious Services: Never    Active Member of Clubs or Organizations: Not on file    Attends Club or Organization Meetings: Never    Marital Status: Divorced  Intimate Partner Violence: Not At Risk (06/29/2023)   Humiliation, Afraid, Rape, and Kick questionnaire  Fear of Current or Ex-Partner: No    Emotionally Abused: No    Physically Abused: No    Sexually Abused: No    Family History  Problem Relation Age of Onset   Alzheimer's disease Mother    Stroke Father    Parkinson's disease Sister    Cancer Sister    Obesity Sister    Diabetes Mellitus II Sister     Allergies  Allergen Reactions   Docosahexaenoic Acid (Dha) (Fish) Diarrhea   Lovaza [Omega-3-Acid Ethyl Esters (Fish)] Diarrhea   Atorvastatin Other (See Comments) and Nausea Only    Joint pain   Hydrocodone Rash, Itching and Other (See Comments)   Ranolazine  Diarrhea   Rosuvastatin Nausea Only and Other (See Comments)    Joint pain  Other reaction(s): Unknown  Joint pain    Joint pain  Other reaction(s): Unknown   Statins Other (See Comments)    Leg pain    Folic Acid     Note: anxiety itching and nausea   Folic Acid-Vit B6-Vit B12 Anxiety, Itching and Nausea Only    folic acid / vitamin B12 / vitamin B6   Pravastatin Nausea Only and Other (See Comments)    Review of Systems  Cardiovascular:  Positive for palpitations.  Musculoskeletal:  Positive for joint pain.  Neurological:  Positive for dizziness.  All other systems reviewed and are negative.       Objective:   BP 128/64   Pulse 68   Ht 6' (1.829 m)   Wt 210 lb 9.6 oz (95.5 kg)   SpO2 93%   BMI 28.56 kg/m   Vitals:   01/08/24 1105  BP: 128/64  Pulse: 68  Height: 6' (1.829 m)  Weight: 210 lb 9.6 oz (95.5 kg)  SpO2: 93%  BMI (Calculated): 28.56    Physical Exam Vitals and nursing note reviewed.  Constitutional:      Appearance: Normal appearance. He is normal weight.  Eyes:     Pupils: Pupils are equal, round, and reactive to light.  Cardiovascular:     Rate and Rhythm: Normal rate and regular rhythm.     Pulses: Normal pulses.  Pulmonary:     Effort: Pulmonary effort is normal.     Breath sounds: Normal breath sounds.  Musculoskeletal:     Left knee: Swelling present. Tenderness present.  Neurological:     General: No focal deficit present.     Mental Status: He is alert and oriented to person, place, and time. Mental status is at baseline.     Gait: Gait abnormal.  Psychiatric:        Mood and Affect: Mood normal.        Behavior: Behavior normal.        Thought Content: Thought content normal.        Judgment: Judgment normal.      No results found for any visits on 01/08/24.  Recent Results (from the past 2160 hours)  POCT glucose (manual entry)     Status: Abnormal   Collection Time: 12/29/23 10:45 AM  Result Value Ref Range   POC Glucose 106 (A) 70 - 99 mg/dl  POCT glucose (manual entry)     Status: Abnormal   Collection Time: 01/12/24 11:00 AM  Result Value Ref Range   POC Glucose 105 (A) 70 - 99 mg/dl  CBC with Differential/Platelet     Status: None   Collection Time: 01/14/24 11:05 AM  Result Value Ref Range   WBC 6.5 3.4 -  10.8 x10E3/uL   RBC 4.52 4.14 - 5.80 x10E6/uL   Hemoglobin 13.6 13.0 - 17.7 g/dL   Hematocrit 58.7 62.4 - 51.0 %   MCV 91 79 - 97 fL   MCH 30.1 26.6 - 33.0 pg   MCHC 33.0 31.5 - 35.7 g/dL   RDW 87.3 88.3 - 84.5 %   Platelets 232 150 - 450 x10E3/uL   Neutrophils 66 Not Estab. %   Lymphs 17 Not Estab. %    Monocytes 9 Not Estab. %   Eos 6 Not Estab. %   Basos 1 Not Estab. %   Neutrophils Absolute 4.3 1.4 - 7.0 x10E3/uL   Lymphocytes Absolute 1.1 0.7 - 3.1 x10E3/uL   Monocytes Absolute 0.6 0.1 - 0.9 x10E3/uL   EOS (ABSOLUTE) 0.4 0.0 - 0.4 x10E3/uL   Basophils Absolute 0.0 0.0 - 0.2 x10E3/uL   Immature Granulocytes 0 Not Estab. %   Immature Grans (Abs) 0.0 0.0 - 0.1 x10E3/uL  Comprehensive metabolic panel with GFR     Status: Abnormal   Collection Time: 01/14/24 11:05 AM  Result Value Ref Range   Glucose 114 (H) 70 - 99 mg/dL   BUN 28 (H) 8 - 27 mg/dL   Creatinine, Ser 8.55 (H) 0.76 - 1.27 mg/dL   eGFR 47 (L) >40 fO/fpw/8.26   BUN/Creatinine Ratio 19 10 - 24   Sodium 141 134 - 144 mmol/L   Potassium 4.7 3.5 - 5.2 mmol/L   Chloride 103 96 - 106 mmol/L   CO2 23 20 - 29 mmol/L   Calcium  9.6 8.6 - 10.2 mg/dL   Total Protein 6.4 6.0 - 8.5 g/dL   Albumin 4.1 3.7 - 4.7 g/dL   Globulin, Total 2.3 1.5 - 4.5 g/dL   Bilirubin Total 0.3 0.0 - 1.2 mg/dL   Alkaline Phosphatase 52 44 - 121 IU/L   AST 11 0 - 40 IU/L   ALT 11 0 - 44 IU/L       Assessment & Plan Vitamin B12 deficiency B12 injection given in office today.  Return for next injection per provider instructions.  Essential hypertension Blood pressure well controlled with current medications.  Continue current therapy.  Will reassess at follow up.   Vitamin D  deficiency Will continue supplements as needed.   Mixed hyperlipidemia Continue current therapy for lipid control. Will modify as needed based on labwork results.      Return in about 3 months (around 04/09/2024).   Total time spent: 20 minutes  ALAN CHRISTELLA ARRANT, FNP  01/08/2024   This document may have been prepared by [facility] Voice Recognition software and as such may include unintentional dictation errors.

## 2024-01-12 LAB — GLUCOSE, POCT (MANUAL RESULT ENTRY): POC Glucose: 105 mg/dL — AB (ref 70–99)

## 2024-01-12 NOTE — Congregational Nurse Program (Signed)
  Dept: [phone]   Congregational Nurse Program Note  Date of Encounter: 01/12/2024  Past Medical History: Past Medical History:  Diagnosis Date   Arthritis    Benign neoplasm of colon 04/13/2008   Overview:  Benign Polyps Of The Large Intestine - colonoscopy 2006,dr Graig Deal of this note might be different from the original. Formatting of this note might be different from the original. Benign Polyps Of The Large Intestine - colonoscopy 2006,dr Graig Deal of this note might be different from the original. Overview: Benign Polyps Of The Large Intestine - colonoscopy 2006,dr    Blurring of visual image 05/28/2018   Cancer (HCC) 1995   prostate ca    Chest pain 07/27/2016   Chest pain, unspecified 07/27/2016   Coronary artery disease    Esophageal stricture 08/2016   GERD (gastroesophageal reflux disease)    Glaucoma    History of hiatal hernia    Hx of CABG 02/24/2018   Formatting of this note might be different from the original. Two-vessel bypass. Formatting of this note might be different from the original. Formatting of this note might be different from the original. Two-vessel bypass. Formatting of this note might be different from the original. Formatting of this note might be different from the original. Two-vessel bypass.   Hypertension    Malignant neoplasm of prostate (HCC) 04/23/2017   MI (mitral incompetence)    MI, acute, non ST segment elevation (HCC) 10/05/2012   Formatting of this note might be different from the original. Note: Unchanged - x4   Old myocardial infarction 05/01/2019   Note: Unchanged - x4   Orthopedic aftercare 10/08/2017   Peripheral neuropathy    Peripheral vascular disease (HCC)    S/P shoulder replacement, left 09/25/2017   Skin sensation disturbance 06/23/2007   Formatting of this note might be different from the original. Tingling (Paresthesia) Formatting of this note might be different from the original. Formatting of  this note might be different from the original. Tingling (Paresthesia)   Tear of meniscus of knee 11/11/2013   Formatting of this note might be different from the original. Formatting of this note might be different from the original. ICD-10 cut over Formatting of this note might be different from the original. Overview: ICD-10 cut over   Vertigo     Encounter Details:  Community Questionnaire - 01/12/24 1116       Questionnaire   Student Assistance N/A    Location [facility] Served  [facility]    Encounter Setting CN site    Population Status Unknown    Insurance Medicare    Medication N/A    Medical Provider Yes    Screening Referrals Made N/A    Medical Referrals Made N/A    Medical Appointment Completed N/A    CNP Interventions Advocate/Support;Spiritual Care    Screenings CN Performed Blood Pressure;Blood Glucose    ED Visit Averted N/A    Life-Saving Intervention Made N/A          Today's Vitals   01/12/24 1115  BP: 132/70   There is no height or weight on file to calculate BMI.   Patient has upcoming heart surgery in 2 weeks.

## 2024-01-13 ENCOUNTER — Other Ambulatory Visit: Payer: Self-pay | Admitting: Cardiovascular Disease

## 2024-01-13 ENCOUNTER — Other Ambulatory Visit: Payer: Self-pay | Admitting: Family

## 2024-01-13 DIAGNOSIS — I25728 Atherosclerosis of autologous artery coronary artery bypass graft(s) with other forms of angina pectoris: Secondary | ICD-10-CM

## 2024-01-13 DIAGNOSIS — I10 Essential (primary) hypertension: Secondary | ICD-10-CM

## 2024-01-13 DIAGNOSIS — R001 Bradycardia, unspecified: Secondary | ICD-10-CM

## 2024-01-13 DIAGNOSIS — R55 Syncope and collapse: Secondary | ICD-10-CM

## 2024-01-13 DIAGNOSIS — E782 Mixed hyperlipidemia: Secondary | ICD-10-CM

## 2024-01-13 DIAGNOSIS — R0602 Shortness of breath: Secondary | ICD-10-CM

## 2024-01-14 ENCOUNTER — Ambulatory Visit: Admitting: Cardiovascular Disease

## 2024-01-14 ENCOUNTER — Other Ambulatory Visit: Payer: Self-pay | Admitting: Cardiovascular Disease

## 2024-01-14 ENCOUNTER — Ambulatory Visit: Payer: Self-pay | Admitting: Cardiovascular Disease

## 2024-01-14 ENCOUNTER — Other Ambulatory Visit: Payer: Self-pay

## 2024-01-14 ENCOUNTER — Encounter: Payer: Self-pay | Admitting: Cardiovascular Disease

## 2024-01-14 VITALS — BP 120/68 | HR 86 | Ht 72.0 in | Wt 209.8 lb

## 2024-01-14 DIAGNOSIS — R0602 Shortness of breath: Secondary | ICD-10-CM

## 2024-01-14 DIAGNOSIS — E782 Mixed hyperlipidemia: Secondary | ICD-10-CM | POA: Diagnosis not present

## 2024-01-14 DIAGNOSIS — R0789 Other chest pain: Secondary | ICD-10-CM

## 2024-01-14 DIAGNOSIS — R9439 Abnormal result of other cardiovascular function study: Secondary | ICD-10-CM | POA: Diagnosis not present

## 2024-01-14 DIAGNOSIS — I25728 Atherosclerosis of autologous artery coronary artery bypass graft(s) with other forms of angina pectoris: Secondary | ICD-10-CM | POA: Diagnosis not present

## 2024-01-14 DIAGNOSIS — R55 Syncope and collapse: Secondary | ICD-10-CM

## 2024-01-14 MED ORDER — FLUTICASONE PROPIONATE 50 MCG/ACT NA SUSP: 1.0000 | NASAL | 1 refills | Status: DC | PRN

## 2024-01-14 NOTE — Progress Notes (Signed)
 Cardiology Office Note   Date:  01/14/2024   ID:  [name] [name], DOB 29-Dec-1935, MRN [id number]  PCP:  Orlean Alan HERO, FNP  Cardiologist:  Denyse Bathe, MD      History of Present Illness: [name] [name] is a 88 y.o. male who presents for  Chief Complaint  Patient presents with   Follow-up    2 week follow up     Chest pain has improved with treatment but has agreed to have cardiac catheterization.  Stress test was abnormal.      Past Medical History:  Diagnosis Date   Arthritis    Benign neoplasm of colon 04/13/2008   Overview:  Benign Polyps Of The Large Intestine - colonoscopy 2006,dr Graig Deal of this note might be different from the original. Formatting of this note might be different from the original. Benign Polyps Of The Large Intestine - colonoscopy 2006,dr Graig Deal of this note might be different from the original. Overview: Benign Polyps Of The Large Intestine - colonoscopy 2006,dr    Blurring of visual image 05/28/2018   Cancer (HCC) 1995   prostate ca    Chest pain 07/27/2016   Chest pain, unspecified 07/27/2016   Coronary artery disease    Esophageal stricture 08/2016   GERD (gastroesophageal reflux disease)    Glaucoma    History of hiatal hernia    Hx of CABG 02/24/2018   Formatting of this note might be different from the original. Two-vessel bypass. Formatting of this note might be different from the original. Formatting of this note might be different from the original. Two-vessel bypass. Formatting of this note might be different from the original. Formatting of this note might be different from the original. Two-vessel bypass.   Hypertension    Malignant neoplasm of prostate (HCC) 04/23/2017   MI (mitral incompetence)    MI, acute, non ST segment elevation (HCC) 10/05/2012   Formatting of this note might be different from the original. Note: Unchanged - x4   Old myocardial infarction 05/01/2019   Note: Unchanged - x4    Orthopedic aftercare 10/08/2017   Peripheral neuropathy    Peripheral vascular disease (HCC)    S/P shoulder replacement, left 09/25/2017   Skin sensation disturbance 06/23/2007   Formatting of this note might be different from the original. Tingling (Paresthesia) Formatting of this note might be different from the original. Formatting of this note might be different from the original. Tingling (Paresthesia)   Tear of meniscus of knee 11/11/2013   Formatting of this note might be different from the original. Formatting of this note might be different from the original. ICD-10 cut over Formatting of this note might be different from the original. Overview: ICD-10 cut over   Vertigo      Past Surgical History:  Procedure Laterality Date   COLONOSCOPY     COLONOSCOPY WITH PROPOFOL  N/A 03/27/2023   Procedure: COLONOSCOPY WITH PROPOFOL ;  Surgeon: Rollin Dover, MD;  Location: [facility];  Service: Gastroenterology;  Laterality: N/A;   CORONARY ANGIOPLASTY WITH STENT PLACEMENT  2005   x9    CORONARY ARTERY BYPASS GRAFT  2011   CORONARY STENT PLACEMENT  07/2014   ESOPHAGOGASTRODUODENOSCOPY     ESOPHAGOGASTRODUODENOSCOPY (EGD) WITH ESOPHAGEAL DILATION  2018   knee scope Bilateral    LEG SURGERY Right 09/2018   LIPOMA EXCISION N/A 09/24/2016   Procedure: EXCISION SUBCUTANEOUS LIPOMA ON UPPER BACK;  Surgeon: Donnice Lima, MD;  Location: [facility] OR;  Service: General;  Laterality: N/A;   POLYPECTOMY  03/27/2023   Procedure: POLYPECTOMY;  Surgeon: Rollin Dover, MD;  Location: [facility];  Service: Gastroenterology;;   Prostectomy  1995   REVERSE SHOULDER ARTHROPLASTY Left 09/25/2017   REVERSE SHOULDER ARTHROPLASTY Left 09/25/2017   Procedure: LEFT REVERSE SHOULDER ARTHROPLASTY; deltoid repair;  Surgeon: Kay Kemps, MD;  Location[name];  Service: Orthopedics;  Laterality: Left;   ROTATOR CUFF REPAIR Left 2001     Current Outpatient Medications  Medication Sig Dispense Refill   acetaminophen   (TYLENOL ) 650 MG CR tablet Take 650 mg by mouth as needed for pain. 2 tablets in the morning and 2 tablets at bedtime     amLODipine  (NORVASC ) 10 MG tablet Take 1 tablet (10 mg total) by mouth daily. 90 tablet 3   aspirin  EC 81 MG tablet Take 81 mg by mouth daily.     azelastine  (OPTIVAR ) 0.05 % ophthalmic solution USE ONE DROP IN BOTH EYES TWICE DAILY 6 mL 5   calcium  carbonate (TUMS - DOSED IN MG ELEMENTAL CALCIUM ) 500 MG chewable tablet Chew 2 tablets by mouth daily as needed for indigestion or heartburn.     celecoxib (CELEBREX) 200 MG capsule TAKE 1 CAPSULE BY MOUTH ONCE DAILY AT NOON 30 capsule 5   cholecalciferol  (VITAMIN D ) 1000 units tablet Take 1,000 Units by mouth daily.     cycloSPORINE  (RESTASIS ) 0.05 % ophthalmic emulsion Place 1 drop into both eyes 2 (two) times daily.     fenofibrate  (TRICOR ) 145 MG tablet TAKE 1 TABLET BY MOUTH ONCE EVERY EVENING 90 tablet 3   fluticasone (FLONASE) 50 MCG/ACT nasal spray Place 1-2 sprays into both nostrils as needed.     furosemide  (LASIX ) 20 MG tablet Take 1 tablet (20 mg total) by mouth daily. 30 tablet 11   ibuprofen (ADVIL) 600 MG tablet Take by mouth.     latanoprost  (XALATAN ) 0.005 % ophthalmic solution PLACE 1 DROP INTO AFFECTED EYE ONCE EVERY EVENING FOR GLUCOMA 2.5 mL 5   levocetirizine (XYZAL ) 5 MG tablet TAKE 1 TABLET BY MOUTH ONCE EVERY EVENING 90 tablet 1   lisinopril  (ZESTRIL ) 10 MG tablet Take 1 tablet (10 mg total) by mouth daily. 30 tablet 2   meclizine  (ANTIVERT ) 12.5 MG tablet Take 1 tablet (12.5 mg total) by mouth daily. 30 tablet 2   metoprolol  succinate (TOPROL  XL) 25 MG 24 hr tablet Take 0.5 tablets (12.5 mg total) by mouth daily. 15 tablet 11   nitroGLYCERIN  (NITROSTAT ) 0.4 MG SL tablet Place 1 tablet (0.4 mg total) under the tongue every 5 (five) minutes as needed for chest pain. 30 tablet 3   nystatin-triamcinolone  (MYCOLOG II) cream APPLY TO AFFECTED AREA(s) TWICE DAILY 60 g 5   pantoprazole  (PROTONIX ) 40 MG tablet TAKE  1 TABLET BY MOUTH TWICE DAILY 60 tablet 6   Polyethyl Glycol-Propyl Glycol (SYSTANE OP) Apply 1 drop to eye 5 (five) times daily as needed (dry eyes).     pregabalin  (LYRICA ) 75 MG capsule TAKE 1 CAPSULE BY MOUTH TWICE DAILY 60 capsule 2   ranolazine  (RANEXA ) 500 MG 12 hr tablet Take 1 tablet (500 mg total) by mouth 2 (two) times daily. 60 tablet 2   REPATHA  SURECLICK 140 MG/ML SOAJ INJECT THE CONTENTS OF 1 PEN SUBCUTANEOUSLY EVERY 14 DAYS 6 mL 2   zolpidem  (AMBIEN ) 10 MG tablet TAKE ONE TABLET BY MOUTH EVERYDAY AT BEDTIME FOR INSOMNIA 30 tablet 3   No current facility-administered medications for this visit.    Allergies:  Docosahexaenoic acid (dha) (fish), Lovaza [omega-3-acid ethyl esters (fish)], Atorvastatin, Hydrocodone, Ranolazine , Rosuvastatin, Statins, Folic acid, Folic acid-vit a3-cpu b12, and Pravastatin    Social History:   reports that he has never smoked. He has never been exposed to tobacco smoke. He has never used smokeless tobacco. He reports that he does not currently use alcohol  after a past usage of about 7.0 standard drinks of alcohol  per week. He reports that he does not use drugs.   Family History:  family history includes Alzheimer's disease in his mother; Cancer in his sister; Diabetes Mellitus II in his sister; Obesity in his sister; Parkinson's disease in his sister; Stroke in his father.    ROS:     Review of Systems  Constitutional: Negative.   HENT: Negative.    Eyes: Negative.   Respiratory: Negative.    Gastrointestinal: Negative.   Genitourinary: Negative.   Musculoskeletal: Negative.   Skin: Negative.   Neurological: Negative.   Endo/Heme/Allergies: Negative.   Psychiatric/Behavioral: Negative.    All other systems reviewed and are negative.     All other systems are reviewed and negative.    PHYSICAL EXAM: VS:  BP 120/68   Pulse 86   Ht 6' (1.829 m)   Wt 209 lb 12.8 oz (95.2 kg)   SpO2 94%   BMI 28.45 kg/m  , BMI Body mass index is  28.45 kg/m. Last weight:  Wt Readings from Last 3 Encounters:  01/14/24 209 lb 12.8 oz (95.2 kg)  01/08/24 210 lb 9.6 oz (95.5 kg)  12/31/23 210 lb 3.2 oz (95.3 kg)     Physical Exam Vitals reviewed.  Constitutional:      Appearance: Normal appearance. He is normal weight.  HENT:     Head: Normocephalic.     Nose: Nose normal.     Mouth/Throat:     Mouth: Mucous membranes are moist.  Eyes:     Pupils: Pupils are equal, round, and reactive to light.  Cardiovascular:     Rate and Rhythm: Normal rate and regular rhythm.     Pulses: Normal pulses.     Heart sounds: Normal heart sounds.  Pulmonary:     Effort: Pulmonary effort is normal.  Abdominal:     General: Abdomen is flat. Bowel sounds are normal.  Musculoskeletal:        General: Normal range of motion.     Cervical back: Normal range of motion.  Skin:    General: Skin is warm.  Neurological:     General: No focal deficit present.     Mental Status: He is alert.  Psychiatric:        Mood and Affect: Mood normal.       EKG:   Recent Labs: 09/28/2023: ALT 10; BUN 45; Creatinine, Ser 1.35; Hemoglobin 14.1; Platelets 195; Potassium 4.6; Sodium 141; TSH 0.448    Lipid Panel    Component Value Date/Time   CHOL 103 09/28/2023 1150   TRIG 113 09/28/2023 1150   HDL 49 09/28/2023 1150   CHOLHDL 2.1 09/28/2023 1150   LDLCALC 33 09/28/2023 1150   LDLDIRECT 62 02/18/2021 0808      Other studies Reviewed: Additional studies/ records that were reviewed today include:  Review of the above records demonstrates:       No data to display            ASSESSMENT AND PLAN:    ICD-10-CM   1. Abnormal nuclear stress test  R94.39 Procedural/ Surgical Case Request:  LEFT HEART CATH AND CORONARY ANGIOGRAPHY with possible coronary intervention    Comprehensive metabolic panel    CBC with Differential/Platelet    2. Other chest pain  R07.89 Procedural/ Surgical Case Request: LEFT HEART CATH AND CORONARY ANGIOGRAPHY  with possible coronary intervention    Comprehensive metabolic panel    CBC with Differential/Platelet   Recurrent chest pain with abnormal stress test plan on doing cardiac catheterization.    3. Mixed hyperlipidemia  E78.2 Comprehensive metabolic panel    CBC with Differential/Platelet    4. SOB (shortness of breath)  R06.02 Comprehensive metabolic panel    CBC with Differential/Platelet    5. Coronary artery disease of autologous bypass graft with stable angina pectoris (HCC)  I25.728 Comprehensive metabolic panel    CBC with Differential/Platelet    6. Syncope, unspecified syncope type  R55 Comprehensive metabolic panel    CBC with Differential/Platelet       Problem List Items Addressed This Visit       Other   Hyperlipidemia   Relevant Orders   Comprehensive metabolic panel   CBC with Differential/Platelet   Other chest pain   Relevant Orders   Procedural/ Surgical Case Request: LEFT HEART CATH AND CORONARY ANGIOGRAPHY with possible coronary intervention   Comprehensive metabolic panel   CBC with Differential/Platelet   Abnormal nuclear stress test - Primary   Relevant Orders   Procedural/ Surgical Case Request: LEFT HEART CATH AND CORONARY ANGIOGRAPHY with possible coronary intervention   Comprehensive metabolic panel   CBC with Differential/Platelet   Other Visit Diagnoses       SOB (shortness of breath)       Relevant Orders   Comprehensive metabolic panel   CBC with Differential/Platelet     Coronary artery disease of autologous bypass graft with stable angina pectoris (HCC)       Relevant Orders   Comprehensive metabolic panel   CBC with Differential/Platelet     Syncope, unspecified syncope type       Relevant Orders   Comprehensive metabolic panel   CBC with Differential/Platelet          Disposition:   Return in about 2 weeks (around 01/28/2024) for Set up left heart catheterization as ideally first case at 730.  And a follow-up.    Total time  spent: 50 minutes  Signed,  Denyse Bathe, MD  01/14/2024 10:55 AM    [facility]

## 2024-01-15 LAB — CBC WITH DIFFERENTIAL/PLATELET
Basophils Absolute: 0 x10E3/uL (ref 0.0–0.2)
Basos: 1 %
EOS (ABSOLUTE): 0.4 x10E3/uL (ref 0.0–0.4)
Eos: 6 %
Hematocrit: 41.2 % (ref 37.5–51.0)
Hemoglobin: 13.6 g/dL (ref 13.0–17.7)
Immature Grans (Abs): 0 x10E3/uL (ref 0.0–0.1)
Immature Granulocytes: 0 %
Lymphocytes Absolute: 1.1 x10E3/uL (ref 0.7–3.1)
Lymphs: 17 %
MCH: 30.1 pg (ref 26.6–33.0)
MCHC: 33 g/dL (ref 31.5–35.7)
MCV: 91 fL (ref 79–97)
Monocytes Absolute: 0.6 x10E3/uL (ref 0.1–0.9)
Monocytes: 9 %
Neutrophils Absolute: 4.3 x10E3/uL (ref 1.4–7.0)
Neutrophils: 66 %
Platelets: 232 x10E3/uL (ref 150–450)
RBC: 4.52 x10E6/uL (ref 4.14–5.80)
RDW: 12.6 % (ref 11.6–15.4)
WBC: 6.5 x10E3/uL (ref 3.4–10.8)

## 2024-01-15 LAB — COMPREHENSIVE METABOLIC PANEL WITH GFR
ALT: 11 IU/L (ref 0–44)
AST: 11 IU/L (ref 0–40)
Albumin: 4.1 g/dL (ref 3.7–4.7)
Alkaline Phosphatase: 52 IU/L (ref 44–121)
BUN/Creatinine Ratio: 19 (ref 10–24)
BUN: 28 mg/dL — ABNORMAL HIGH (ref 8–27)
Bilirubin Total: 0.3 mg/dL (ref 0.0–1.2)
CO2: 23 mmol/L (ref 20–29)
Calcium: 9.6 mg/dL (ref 8.6–10.2)
Chloride: 103 mmol/L (ref 96–106)
Creatinine, Ser: 1.44 mg/dL — ABNORMAL HIGH (ref 0.76–1.27)
Globulin, Total: 2.3 g/dL (ref 1.5–4.5)
Glucose: 114 mg/dL — ABNORMAL HIGH (ref 70–99)
Potassium: 4.7 mmol/L (ref 3.5–5.2)
Sodium: 141 mmol/L (ref 134–144)
Total Protein: 6.4 g/dL (ref 6.0–8.5)
eGFR: 47 mL/min/1.73 — ABNORMAL LOW (ref >59)

## 2024-01-17 ENCOUNTER — Encounter: Payer: Self-pay | Admitting: Family

## 2024-01-17 NOTE — Assessment & Plan Note (Signed)
 Continue current therapy for lipid control. Will modify as needed based on labwork results.

## 2024-01-17 NOTE — Assessment & Plan Note (Signed)
 Blood pressure well controlled with current medications.  Continue current therapy.  Will reassess at follow up.

## 2024-01-17 NOTE — Assessment & Plan Note (Signed)
 Will continue supplements as needed.

## 2024-01-17 NOTE — Assessment & Plan Note (Signed)
 B12 injection given in office today.  Return for next injection per provider instructions.

## 2024-01-26 NOTE — Congregational Nurse Program (Signed)
  Dept: [phone]   Congregational Nurse Program Note  Date of Encounter: 01/26/2024  Past Medical History: Past Medical History:  Diagnosis Date   Arthritis    Benign neoplasm of colon 04/13/2008   Overview:  Benign Polyps Of The Large Intestine - colonoscopy 2006,dr Graig Deal of this note might be different from the original. Formatting of this note might be different from the original. Benign Polyps Of The Large Intestine - colonoscopy 2006,dr Graig Deal of this note might be different from the original. Overview: Benign Polyps Of The Large Intestine - colonoscopy 2006,dr    Blurring of visual image 05/28/2018   Cancer (HCC) 1995   prostate ca    Chest pain 07/27/2016   Chest pain, unspecified 07/27/2016   Coronary artery disease    Esophageal stricture 08/2016   GERD (gastroesophageal reflux disease)    Glaucoma    History of hiatal hernia    Hx of CABG 02/24/2018   Formatting of this note might be different from the original. Two-vessel bypass. Formatting of this note might be different from the original. Formatting of this note might be different from the original. Two-vessel bypass. Formatting of this note might be different from the original. Formatting of this note might be different from the original. Two-vessel bypass.   Hypertension    Malignant neoplasm of prostate (HCC) 04/23/2017   MI (mitral incompetence)    MI, acute, non ST segment elevation (HCC) 10/05/2012   Formatting of this note might be different from the original. Note: Unchanged - x4   Old myocardial infarction 05/01/2019   Note: Unchanged - x4   Orthopedic aftercare 10/08/2017   Peripheral neuropathy    Peripheral vascular disease (HCC)    S/P shoulder replacement, left 09/25/2017   Skin sensation disturbance 06/23/2007   Formatting of this note might be different from the original. Tingling (Paresthesia) Formatting of this note might be different from the original. Formatting of  this note might be different from the original. Tingling (Paresthesia)   Tear of meniscus of knee 11/11/2013   Formatting of this note might be different from the original. Formatting of this note might be different from the original. ICD-10 cut over Formatting of this note might be different from the original. Overview: ICD-10 cut over   Vertigo     Encounter Details:   Today's Vitals   01/26/24 2010  BP: (!) 136/58   There is no height or weight on file to calculate BMI.  Patient has upcoming surgery this week.  Prayed with patient.

## 2024-01-28 ENCOUNTER — Observation Stay
Admission: RE | Admit: 2024-01-28 | Discharge: 2024-01-29 | Disposition: A | Discharge status: Home / Self Care | Attending: Internal Medicine | Admitting: Internal Medicine

## 2024-01-28 ENCOUNTER — Encounter: Payer: Self-pay | Admitting: Cardiovascular Disease

## 2024-01-28 ENCOUNTER — Encounter
Admission: RE | Disposition: A | Discharge status: Home / Self Care | Payer: Self-pay | Source: Home / Self Care | Attending: Internal Medicine

## 2024-01-28 ENCOUNTER — Other Ambulatory Visit: Payer: Self-pay

## 2024-01-28 DIAGNOSIS — Z955 Presence of coronary angioplasty implant and graft: Principal | ICD-10-CM

## 2024-01-28 DIAGNOSIS — Z951 Presence of aortocoronary bypass graft: Secondary | ICD-10-CM | POA: Diagnosis not present

## 2024-01-28 DIAGNOSIS — I10 Essential (primary) hypertension: Secondary | ICD-10-CM | POA: Insufficient documentation

## 2024-01-28 DIAGNOSIS — R0789 Other chest pain: Secondary | ICD-10-CM | POA: Insufficient documentation

## 2024-01-28 DIAGNOSIS — Z7982 Long term (current) use of aspirin: Secondary | ICD-10-CM | POA: Diagnosis not present

## 2024-01-28 DIAGNOSIS — Z8546 Personal history of malignant neoplasm of prostate: Secondary | ICD-10-CM | POA: Insufficient documentation

## 2024-01-28 DIAGNOSIS — I2510 Atherosclerotic heart disease of native coronary artery without angina pectoris: Secondary | ICD-10-CM | POA: Diagnosis present

## 2024-01-28 DIAGNOSIS — I200 Unstable angina: Secondary | ICD-10-CM | POA: Diagnosis not present

## 2024-01-28 DIAGNOSIS — Z96612 Presence of left artificial shoulder joint: Secondary | ICD-10-CM | POA: Insufficient documentation

## 2024-01-28 DIAGNOSIS — R9439 Abnormal result of other cardiovascular function study: Secondary | ICD-10-CM | POA: Diagnosis not present

## 2024-01-28 DIAGNOSIS — E782 Mixed hyperlipidemia: Secondary | ICD-10-CM | POA: Insufficient documentation

## 2024-01-28 DIAGNOSIS — Z79899 Other long term (current) drug therapy: Secondary | ICD-10-CM | POA: Diagnosis not present

## 2024-01-28 HISTORY — PX: CORONARY BALLOON ANGIOPLASTY: CATH118233

## 2024-01-28 HISTORY — PX: LEFT HEART CATH AND CORONARY ANGIOGRAPHY: CATH118249

## 2024-01-28 LAB — POCT ACTIVATED CLOTTING TIME
Activated Clotting Time: 204 s
Activated Clotting Time: 245 s

## 2024-01-28 SURGERY — LEFT HEART CATH AND CORONARY ANGIOGRAPHY
Anesthesia: Moderate Sedation

## 2024-01-28 MED ORDER — HEPARIN SODIUM (PORCINE) 1000 UNIT/ML IJ SOLN
INTRAMUSCULAR | Status: DC | PRN
Start: 2024-01-28 — End: 2024-01-28
  Administered 2024-01-28: 2000 [IU] via INTRAVENOUS

## 2024-01-28 MED ORDER — LIDOCAINE HCL 1 % IJ SOLN
INTRAMUSCULAR | Status: AC
  Filled 2024-01-28: qty 20

## 2024-01-28 MED ORDER — HEPARIN SODIUM (PORCINE) 1000 UNIT/ML IJ SOLN
INTRAMUSCULAR | Status: AC
  Filled 2024-01-28: qty 10

## 2024-01-28 MED ORDER — SODIUM CHLORIDE 0.9 % WEIGHT BASED INFUSION
3.0000 mL/kg/h | INTRAVENOUS | Status: DC
  Administered 2024-01-28: 3 mL/kg/h via INTRAVENOUS

## 2024-01-28 MED ORDER — FENOFIBRATE 160 MG PO TABS
160.0000 mg | ORAL_TABLET | Freq: Every day | ORAL | Status: DC
  Administered 2024-01-29: 160 mg via ORAL
  Filled 2024-01-28: qty 1

## 2024-01-28 MED ORDER — ASPIRIN 81 MG PO TBEC
81.0000 mg | DELAYED_RELEASE_TABLET | Freq: Every day | ORAL | Status: DC
  Administered 2024-01-29: 81 mg via ORAL
  Filled 2024-01-28: qty 1

## 2024-01-28 MED ORDER — AMLODIPINE BESYLATE 5 MG PO TABS
10.0000 mg | ORAL_TABLET | Freq: Every day | ORAL | Status: DC
  Administered 2024-01-29: 10 mg via ORAL
  Filled 2024-01-28: qty 2

## 2024-01-28 MED ORDER — SODIUM CHLORIDE 0.9 % WEIGHT BASED INFUSION
1.0000 mL/kg/h | INTRAVENOUS | Status: AC
  Administered 2024-01-28: 1 mL/kg/h via INTRAVENOUS

## 2024-01-28 MED ORDER — METOPROLOL SUCCINATE ER 25 MG PO TB24
12.5000 mg | ORAL_TABLET | Freq: Every day | ORAL | Status: DC
  Administered 2024-01-29: 12.5 mg via ORAL
  Filled 2024-01-28 (×2): qty 1

## 2024-01-28 MED ORDER — HEPARIN SODIUM (PORCINE) 1000 UNIT/ML IJ SOLN
INTRAMUSCULAR | Status: DC | PRN
  Administered 2024-01-28: 10000 [IU] via INTRAVENOUS

## 2024-01-28 MED ORDER — HEPARIN (PORCINE) IN NACL 1000-0.9 UT/500ML-% IV SOLN
INTRAVENOUS | Status: AC
  Filled 2024-01-28: qty 1000

## 2024-01-28 MED ORDER — FENTANYL CITRATE (PF) 100 MCG/2ML IJ SOLN
INTRAMUSCULAR | Status: DC | PRN
  Administered 2024-01-28: 25 ug via INTRAVENOUS

## 2024-01-28 MED ORDER — CELECOXIB 200 MG PO CAPS
200.0000 mg | ORAL_CAPSULE | Freq: Every day | ORAL | Status: DC
Start: 2024-01-28 — End: 2024-01-29
  Administered 2024-01-28: 200 mg via ORAL
  Filled 2024-01-28 (×2): qty 1

## 2024-01-28 MED ORDER — CLOPIDOGREL BISULFATE 75 MG PO TABS
ORAL_TABLET | ORAL | Status: AC
  Filled 2024-01-28: qty 8

## 2024-01-28 MED ORDER — SODIUM CHLORIDE 0.9% FLUSH: 3.0000 mL | INTRAVENOUS | Status: DC | PRN

## 2024-01-28 MED ORDER — LORATADINE 10 MG PO TABS: 10.0000 mg | ORAL_TABLET | Freq: Every evening | ORAL | Status: DC

## 2024-01-28 MED ORDER — PANTOPRAZOLE SODIUM 40 MG PO TBEC
40.0000 mg | DELAYED_RELEASE_TABLET | Freq: Two times a day (BID) | ORAL | Status: DC
  Administered 2024-01-28 – 2024-01-29 (×2): 40 mg via ORAL
  Filled 2024-01-28 (×2): qty 1

## 2024-01-28 MED ORDER — HYDRALAZINE HCL 20 MG/ML IJ SOLN: 10.0000 mg | INTRAMUSCULAR | Status: AC | PRN

## 2024-01-28 MED ORDER — ASPIRIN 81 MG PO CHEW: 81.0000 mg | CHEWABLE_TABLET | Freq: Every day | ORAL | Status: DC

## 2024-01-28 MED ORDER — SODIUM CHLORIDE 0.9% FLUSH: 3.0000 mL | Freq: Two times a day (BID) | INTRAVENOUS | Status: DC

## 2024-01-28 MED ORDER — ONDANSETRON HCL 4 MG/2ML IJ SOLN: 4.0000 mg | Freq: Four times a day (QID) | INTRAMUSCULAR | Status: DC | PRN

## 2024-01-28 MED ORDER — HEPARIN SODIUM (PORCINE) 1000 UNIT/ML IJ SOLN
INTRAMUSCULAR | Status: DC | PRN
Start: 2024-01-28 — End: 2024-01-28
  Administered 2024-01-28: 5000 [IU] via INTRAVENOUS

## 2024-01-28 MED ORDER — MIDAZOLAM HCL 2 MG/2ML IJ SOLN
INTRAMUSCULAR | Status: DC | PRN
  Administered 2024-01-28: 1 mg via INTRAVENOUS

## 2024-01-28 MED ORDER — SODIUM CHLORIDE 0.9 % IV SOLN: 250.0000 mL | INTRAVENOUS | Status: DC | PRN

## 2024-01-28 MED ORDER — HEPARIN (PORCINE) IN NACL 1000-0.9 UT/500ML-% IV SOLN
INTRAVENOUS | Status: DC | PRN
  Administered 2024-01-28: 1000 mL

## 2024-01-28 MED ORDER — SODIUM CHLORIDE 0.9 % IV BOLUS
INTRAVENOUS | Status: AC | PRN
  Administered 2024-01-28: 500 mL via INTRAVENOUS

## 2024-01-28 MED ORDER — ACETAMINOPHEN 325 MG PO TABS: 650.0000 mg | ORAL_TABLET | ORAL | Status: DC | PRN

## 2024-01-28 MED ORDER — PREGABALIN 75 MG PO CAPS: 75.0000 mg | ORAL_CAPSULE | Freq: Two times a day (BID) | ORAL | Status: DC

## 2024-01-28 MED ORDER — FUROSEMIDE 20 MG PO TABS
20.0000 mg | ORAL_TABLET | Freq: Every day | ORAL | Status: DC
  Administered 2024-01-29: 20 mg via ORAL
  Filled 2024-01-28: qty 1

## 2024-01-28 MED ORDER — CLOPIDOGREL BISULFATE 75 MG PO TABS
ORAL_TABLET | ORAL | Status: DC | PRN
Start: 2024-01-28 — End: 2024-01-28
  Administered 2024-01-28: 600 mg via ORAL

## 2024-01-28 MED ORDER — SODIUM CHLORIDE 0.9 % WEIGHT BASED INFUSION: 1.0000 mL/kg/h | INTRAVENOUS | Status: DC

## 2024-01-28 MED ORDER — FENTANYL CITRATE (PF) 100 MCG/2ML IJ SOLN
INTRAMUSCULAR | Status: DC | PRN
  Administered 2024-01-28: 50 ug via INTRAVENOUS

## 2024-01-28 MED ORDER — MECLIZINE HCL 25 MG PO TABS
12.5000 mg | ORAL_TABLET | Freq: Every day | ORAL | Status: DC
  Filled 2024-01-28: qty 0.5

## 2024-01-28 MED ORDER — MELATONIN 5 MG PO TABS
5.0000 mg | ORAL_TABLET | Freq: Every day | ORAL | Status: DC
  Administered 2024-01-28: 5 mg via ORAL
  Filled 2024-01-28: qty 1

## 2024-01-28 MED ORDER — ASPIRIN 81 MG PO CHEW: 81.0000 mg | CHEWABLE_TABLET | ORAL | Status: DC

## 2024-01-28 MED ORDER — MIDAZOLAM HCL 2 MG/2ML IJ SOLN
INTRAMUSCULAR | Status: AC
  Filled 2024-01-28: qty 2

## 2024-01-28 MED ORDER — IOHEXOL 300 MG/ML  SOLN
INTRAMUSCULAR | Status: DC | PRN
  Administered 2024-01-28: 76 mL

## 2024-01-28 MED ORDER — SODIUM CHLORIDE 0.9 % IV BOLUS: 500.0000 mL | Freq: Once | INTRAVENOUS | Status: DC

## 2024-01-28 MED ORDER — LISINOPRIL 10 MG PO TABS
10.0000 mg | ORAL_TABLET | Freq: Every day | ORAL | Status: DC
  Administered 2024-01-29: 10 mg via ORAL
  Filled 2024-01-28: qty 1

## 2024-01-28 MED ORDER — IOHEXOL 300 MG/ML  SOLN
INTRAMUSCULAR | Status: DC | PRN
  Administered 2024-01-28: 105 mL

## 2024-01-28 MED ORDER — SODIUM CHLORIDE 0.9% FLUSH
3.0000 mL | Freq: Two times a day (BID) | INTRAVENOUS | Status: DC
  Administered 2024-01-28 – 2024-01-29 (×2): 3 mL via INTRAVENOUS

## 2024-01-28 MED ORDER — FENTANYL CITRATE (PF) 100 MCG/2ML IJ SOLN
INTRAMUSCULAR | Status: AC
Start: 2024-01-28 — End: 2024-01-28
  Filled 2024-01-28: qty 2

## 2024-01-28 MED ORDER — SODIUM CHLORIDE 0.9% FLUSH
3.0000 mL | INTRAVENOUS | Status: DC | PRN
Start: 2024-01-28 — End: 2024-01-29

## 2024-01-28 MED ORDER — SODIUM CHLORIDE 0.9 % IV SOLN
INTRAVENOUS | Status: AC | PRN
  Administered 2024-01-28: 555 mL/h via INTRAVENOUS

## 2024-01-28 MED ORDER — CLOPIDOGREL BISULFATE 75 MG PO TABS
75.0000 mg | ORAL_TABLET | Freq: Every day | ORAL | Status: DC
  Administered 2024-01-29: 75 mg via ORAL
  Filled 2024-01-28: qty 1

## 2024-01-28 MED ORDER — RANOLAZINE ER 500 MG PO TB12
500.0000 mg | ORAL_TABLET | Freq: Two times a day (BID) | ORAL | Status: DC
  Administered 2024-01-28 – 2024-01-29 (×2): 500 mg via ORAL
  Filled 2024-01-28 (×2): qty 1

## 2024-01-28 MED ORDER — PREGABALIN 75 MG PO CAPS
75.0000 mg | ORAL_CAPSULE | Freq: Two times a day (BID) | ORAL | Status: DC
  Administered 2024-01-28 – 2024-01-29 (×2): 75 mg via ORAL
  Filled 2024-01-28 (×2): qty 1

## 2024-01-28 MED ORDER — LIDOCAINE HCL (PF) 1 % IJ SOLN
INTRAMUSCULAR | Status: DC | PRN
  Administered 2024-01-28: 2 mL

## 2024-01-28 SURGICAL SUPPLY — 20 items
BALLOON ~~LOC~~ EUPHORA RX 3.0X20 (BALLOONS) IMPLANT
CATH ANGIO 5F JB2 100CM (CATHETERS) IMPLANT
CATH INFINITI 5 FR IM (CATHETERS) IMPLANT
CATH INFINITI 5FR MULTPACK ANG (CATHETERS) IMPLANT
CATH VISTA GUIDE 6FR JR4 ECOPK (CATHETERS) IMPLANT
DEVICE CLOSURE MYNXGRIP 6/7F (Vascular Products) IMPLANT
DRAPE BRACHIAL (DRAPES) IMPLANT
KIT ENCORE 26 ADVANTAGE (KITS) IMPLANT
KIT SYRINGE INJ CVI SPIKEX1 (MISCELLANEOUS) IMPLANT
NDL PERC 18GX7CM (NEEDLE) IMPLANT
NEEDLE PERC 18GX7CM (NEEDLE) ×2 IMPLANT
PACK CARDIAC CATH (CUSTOM PROCEDURE TRAY) ×2 IMPLANT
SET ATX-X65L (MISCELLANEOUS) IMPLANT
SHEATH AVANTI 5FR X 11CM (SHEATH) IMPLANT
SHEATH AVANTI 6FR X 11CM (SHEATH) IMPLANT
STATION PROTECTION PRESSURIZED (MISCELLANEOUS) IMPLANT
TUBING CIL FLEX 10 FLL-RA (TUBING) IMPLANT
WIRE EMERALD 3MM-J .035X260CM (WIRE) IMPLANT
WIRE G HI TQ BMW 190 (WIRE) IMPLANT
WIRE J 3MM .035X145CM (WIRE) IMPLANT

## 2024-01-28 NOTE — Plan of Care (Signed)
  Problem: Cardiovascular: Goal: Ability to achieve and maintain adequate cardiovascular perfusion will improve Outcome: Progressing   Problem: Cardiovascular: Goal: Vascular access site(s) Level 0-1 will be maintained Outcome: Progressing   Problem: Clinical Measurements: Goal: Ability to maintain clinical measurements within normal limits will improve Outcome: Progressing   Problem: Clinical Measurements: Goal: Cardiovascular complication will be avoided Outcome: Progressing   Problem: Pain Managment: Goal: General experience of comfort will improve and/or be controlled Outcome: Progressing

## 2024-01-29 ENCOUNTER — Encounter: Payer: Self-pay | Admitting: Cardiovascular Disease

## 2024-01-29 DIAGNOSIS — I2510 Atherosclerotic heart disease of native coronary artery without angina pectoris: Secondary | ICD-10-CM | POA: Diagnosis not present

## 2024-01-29 DIAGNOSIS — Z955 Presence of coronary angioplasty implant and graft: Secondary | ICD-10-CM | POA: Diagnosis not present

## 2024-01-29 DIAGNOSIS — Z9861 Coronary angioplasty status: Secondary | ICD-10-CM | POA: Diagnosis not present

## 2024-01-29 LAB — BASIC METABOLIC PANEL WITH GFR
Anion gap: 6 (ref 5–15)
BUN: 24 mg/dL — ABNORMAL HIGH (ref 8–23)
CO2: 24 mmol/L (ref 22–32)
Calcium: 9.3 mg/dL (ref 8.9–10.3)
Chloride: 109 mmol/L (ref 98–111)
Creatinine, Ser: 1.12 mg/dL (ref 0.61–1.24)
GFR, Estimated: >60 mL/min (ref >60)
Glucose, Bld: 87 mg/dL (ref 70–99)
Potassium: 3.8 mmol/L (ref 3.5–5.1)
Sodium: 139 mmol/L (ref 135–145)

## 2024-01-29 LAB — CBC
HCT: 37.7 % — ABNORMAL LOW (ref 39.0–52.0)
Hemoglobin: 13.1 g/dL (ref 13.0–17.0)
MCH: 30.3 pg (ref 26.0–34.0)
MCHC: 34.7 g/dL (ref 30.0–36.0)
MCV: 87.1 fL (ref 80.0–100.0)
Platelets: 177 K/uL (ref 150–400)
RBC: 4.33 MIL/uL (ref 4.22–5.81)
RDW: 12.6 % (ref 11.5–15.5)
WBC: 4.3 K/uL (ref 4.0–10.5)
nRBC: 0 % (ref 0.0–0.2)

## 2024-01-29 MED ORDER — CLOPIDOGREL BISULFATE 75 MG PO TABS: 75.0000 mg | ORAL_TABLET | Freq: Every day | ORAL | 0 refills | Status: DC

## 2024-01-29 NOTE — Care Management Obs Status (Signed)
 MEDICARE OBSERVATION STATUS NOTIFICATION   Patient Details  Name: [name] [name] MRN: [id number] Date of Birth: [birth year]   Medicare Observation Status Notification Given:  No (patient discharged)    [name] 01/29/2024, 11:27 AM

## 2024-01-29 NOTE — Discharge Summary (Signed)
 Discharge Summary      Patient ID: [name] [name] MRN: [id number] DOB/AGE: [birth year] 88 y.o.  Admit date: 01/28/2024 Discharge date: 01/29/2024  Primary Discharge Diagnosis Coronary Artery Disease Secondary Discharge Diagnosis Coronary artery disease s/p coronary stent placement  Significant Diagnostic Studies: Left Cardiac Craterization (01/28/2024)  Consults: None  Hospital Course: The patient was brought to the cardiac cath lab and underwent  left heart catheterization and coronary angiography with Dr. Deretha on 01/28/2024. The patient tolerated with procedure well without complications.  The patient received PCI in-stent restenosis to SVG diagonal . On 07/18, the right groin access site was examined and found to be without significant erythema, tenderness to palpation, or apparent aneurysm. Hospital course was overall uneventful, on day of discharge the patient was ambulatory and eager to go home.  Discussed cardiac rehab and new prescription medications in detail. The patient was given aftercare instructions and ER return precautions. Patient will have follow-up with Dr. Deretha in 1 to 2 weeks.   LHC 01/28/2024 with Dr. Deretha   Prox LAD to Mid LAD lesion is 100% stenosed.   Prox RCA lesion is 45% stenosed.   Prox Graft to Mid Graft lesion is 80% stenosed.   Drug-coated balloon angioplasty was performed using a BALLOON [employer] EUPHORA RX 3.0X20.   Post intervention, there is a 0% residual stenosis.   Recommend uninterrupted dual antiplatelet therapy with Aspirin  81mg  daily and Clopidogrel  75mg  daily for a minimum of 12 months (ACS-Class I recommendation).   Successful PCI of instent restenosis of SVG to Dx1   Conclusion 95% in-stent restenosis to SVG diagonal   Successful PCI of in-stent restenosis to diagonal 95->0%  Patient tolerated procedure well No complications Aspirin  Plavix  Have the patient have patient follow-up with Follow-up with Dr. Fernand 1 to 2 weeks   Discharge  Exam: Blood pressure (!) 144/69, pulse 66, temperature 98.9 F (37.2 C), resp. rate 18, height 5' 11.5 (1.816 m), weight 94 kg, SpO2 96%.    General: Well appearing elderly male, well nourished, in no acute distress.  HEENT:  Normocephalic and atraumatic. Neck:   No JVD.  Lungs: Normal respiratory effort on room air.  Clear to ascultation bilaterally. Heart: HRRR . Normal S1 and S2 without gallops or murmurs.  Abdomen: non-distended appearing.  Msk: Normal strength and tone for age. Extremities: No peripheral edema. Right groin without bleeding, significant tenderness to palpation, apparent aneurysm, or significant ecchymosis.  Neuro: Alert and oriented x3 Psych:  calm and cooperative.   Labs:   Lab Results  Component Value Date   WBC 4.3 01/29/2024   HGB 13.1 01/29/2024   HCT 37.7 (L) 01/29/2024   MCV 87.1 01/29/2024   PLT 177 01/29/2024    Recent Labs  Lab 01/29/24 0505  NA 139  K 3.8  CL 109  CO2 24  BUN 24*  CREATININE 1.12  CALCIUM  9.3  GLUCOSE 87      Radiology: None  EKG: sinus rhythm, rate rate 57 bpm  FOLLOW UP PLANS AND APPOINTMENTS Discharge Instructions     AMB Referral to Cardiac Rehabilitation - Phase II   Complete by: As directed    Diagnosis: PTCA   After initial evaluation and assessments completed: Virtual Based Care may be provided alone or in conjunction with Phase 2 Cardiac Rehab based on patient barriers.: Yes   Intensive Cardiac Rehabilitation (ICR) [facility] location only OR Traditional Cardiac Rehabilitation (TCR) *If criteria for ICR are not met will enroll in TCR ([facility] only): Yes  Allergies as of 01/29/2024       Reactions   Docosahexaenoic Acid (dha) (fish) Diarrhea   Lovaza [omega-3-acid Ethyl Esters (fish)] Diarrhea   Atorvastatin Other (See Comments), Nausea Only   Joint pain   Hydrocodone Rash, Itching, Other (See Comments)   Ranolazine  Diarrhea   Rosuvastatin Nausea Only, Other (See Comments)   Joint pain Other  reaction(s): Unknown Joint pain    Joint pain  Other reaction(s): Unknown   Statins Other (See Comments)   Leg pain    Folic Acid    Note: anxiety itching and nausea   Folic Acid-vit B6-vit B12 Anxiety, Itching, Nausea Only   folic acid / vitamin B12 / vitamin B6   Pravastatin Nausea Only, Other (See Comments)        Medication List     TAKE these medications    acetaminophen  650 MG CR tablet Commonly known as: TYLENOL  Take 650 mg by mouth as needed for pain. 2 tablets in the morning and 2 tablets at bedtime   amLODipine  10 MG tablet Commonly known as: NORVASC  Take 1 tablet (10 mg total) by mouth daily.   aspirin  EC 81 MG tablet Take 81 mg by mouth daily.   azelastine  0.05 % ophthalmic solution Commonly known as: OPTIVAR  USE ONE DROP IN BOTH EYES TWICE DAILY   calcium  carbonate 500 MG chewable tablet Commonly known as: TUMS - dosed in mg elemental calcium  Chew 2 tablets by mouth daily as needed for indigestion or heartburn.   celecoxib  200 MG capsule Commonly known as: CELEBREX  TAKE 1 CAPSULE BY MOUTH ONCE DAILY AT NOON   cholecalciferol  1000 units tablet Commonly known as: VITAMIN D  Take 1,000 Units by mouth daily.   clopidogrel  75 MG tablet Commonly known as: PLAVIX  Take 1 tablet (75 mg total) by mouth daily with breakfast.   cycloSPORINE  0.05 % ophthalmic emulsion Commonly known as: RESTASIS  Place 1 drop into both eyes 2 (two) times daily.   fenofibrate  145 MG tablet Commonly known as: TRICOR  TAKE 1 TABLET BY MOUTH ONCE EVERY EVENING   fluticasone  50 MCG/ACT nasal spray Commonly known as: FLONASE  Place 1-2 sprays into both nostrils as needed.   furosemide  20 MG tablet Commonly known as: Lasix  Take 1 tablet (20 mg total) by mouth daily.   latanoprost  0.005 % ophthalmic solution Commonly known as: XALATAN  PLACE 1 DROP INTO AFFECTED EYE ONCE EVERY EVENING FOR GLUCOMA   levocetirizine 5 MG tablet Commonly known as: XYZAL  TAKE 1 TABLET BY MOUTH  ONCE EVERY EVENING   lisinopril  10 MG tablet Commonly known as: ZESTRIL  TAKE 1 TABLET BY MOUTH ONCE DAILY   meclizine  12.5 MG tablet Commonly known as: ANTIVERT  Take 1 tablet (12.5 mg total) by mouth daily.   metoprolol  succinate 25 MG 24 hr tablet Commonly known as: Toprol  XL Take 0.5 tablets (12.5 mg total) by mouth daily.   nitroGLYCERIN  0.4 MG SL tablet Commonly known as: NITROSTAT  Place 1 tablet (0.4 mg total) under the tongue every 5 (five) minutes as needed for chest pain.   nystatin-triamcinolone  cream Commonly known as: MYCOLOG II APPLY TO AFFECTED AREA(s) TWICE DAILY   pantoprazole  40 MG tablet Commonly known as: PROTONIX  TAKE 1 TABLET BY MOUTH TWICE DAILY   pregabalin  75 MG capsule Commonly known as: LYRICA  TAKE 1 CAPSULE BY MOUTH TWICE DAILY   ranolazine  500 MG 12 hr tablet Commonly known as: Ranexa  Take 1 tablet (500 mg total) by mouth 2 (two) times daily.   Repatha  SureClick 140 MG/ML Soaj Generic  drug: Evolocumab  INJECT THE CONTENTS OF 1 PEN SUBCUTANEOUSLY EVERY 14 DAYS   SYSTANE OP Apply 1 drop to eye 5 (five) times daily as needed (dry eyes).        Follow-up Information     Fernand Denyse LABOR, MD. Go in 1 week(s).   Specialty: Cardiology Contact information: [address] [place] [address] [phone]                 PLEASE BRING ALL MEDICATIONS WITH YOU TO FOLLOW UP APPOINTMENTS  Time spent with patient: 35 minutes Signed:  Dorene Comfort PA-C 01/29/2024, 8:29 AM [facility]

## 2024-01-29 NOTE — Progress Notes (Signed)
 Pt alert and oriented. VSS. Discharge instructions reviewed with pt and daughter. Pt and daughter verbalized understanding. Pt verbalized understanding of need to pick up med from pharmacy. Pt verbalized understanding of need to schedule/attend follow up appointments. 2 IV's removed. IV sites WNL. Right femoral cath site WNL. Printed discharge instructions given to pt. Pt left unit in wheelchair with RN. Pt left hospital with daughter.

## 2024-01-29 NOTE — TOC Transition Note (Signed)
 Transition of Care [facility]) - Discharge Note   Patient Details  Name: [name] [name] MRN: [id number] Date of Birth: February 17, 1936  Transition of Care [facility]) CM/SW Contact:  Avalon Coppinger C Sayyid Harewood, RN Phone Number: 01/29/2024, 10:49 AM   Clinical Narrative:    Spoke with patient and his daughter at the bedside regarding home health at patient's request. Patient and daughter confirms he falls often. They were provided choices for [facility]. Patient did not have a preference for HH. He had been advised a referral will be sent to [facility] and a representative will contact him to schedule his SOC. Patient daughter will transport him home.   Referral sent to and accepted by [place]  from [facility]  TOC signing off.           Patient Goals and CMS Choice            Discharge Placement                       Discharge Plan and Services Additional resources added to the After Visit Summary for                                       Social Drivers of Health (SDOH) Interventions SDOH Screenings   Food Insecurity: No Food Insecurity (01/28/2024)  Housing: Low Risk  (01/28/2024)  Transportation Needs: No Transportation Needs (01/28/2024)  Utilities: Not At Risk (01/28/2024)  Alcohol  Screen: Low Risk  (06/29/2023)  Depression (PHQ2-9): Low Risk  (06/29/2023)  Financial Resource Strain: Low Risk  (06/29/2023)  Physical Activity: Sufficiently Active (06/29/2023)  Social Connections: Socially Isolated (01/28/2024)  Stress: No Stress Concern Present (06/29/2023)  Tobacco Use: Low Risk  (01/17/2024)  Health Literacy: Adequate Health Literacy (06/29/2023)     Readmission Risk Interventions     No data to display

## 2024-01-31 DIAGNOSIS — I739 Peripheral vascular disease, unspecified: Secondary | ICD-10-CM | POA: Diagnosis not present

## 2024-01-31 DIAGNOSIS — I10 Essential (primary) hypertension: Secondary | ICD-10-CM | POA: Diagnosis not present

## 2024-01-31 DIAGNOSIS — Z48812 Encounter for surgical aftercare following surgery on the circulatory system: Secondary | ICD-10-CM | POA: Diagnosis not present

## 2024-01-31 DIAGNOSIS — H353230 Exudative age-related macular degeneration, bilateral, stage unspecified: Secondary | ICD-10-CM | POA: Diagnosis not present

## 2024-01-31 DIAGNOSIS — I8390 Asymptomatic varicose veins of unspecified lower extremity: Secondary | ICD-10-CM | POA: Diagnosis not present

## 2024-01-31 DIAGNOSIS — G629 Polyneuropathy, unspecified: Secondary | ICD-10-CM | POA: Diagnosis not present

## 2024-01-31 DIAGNOSIS — I25728 Atherosclerosis of autologous artery coronary artery bypass graft(s) with other forms of angina pectoris: Secondary | ICD-10-CM | POA: Diagnosis not present

## 2024-01-31 DIAGNOSIS — G5730 Lesion of lateral popliteal nerve, unspecified lower limb: Secondary | ICD-10-CM | POA: Diagnosis not present

## 2024-01-31 DIAGNOSIS — I252 Old myocardial infarction: Secondary | ICD-10-CM | POA: Diagnosis not present

## 2024-02-01 ENCOUNTER — Telehealth: Payer: Self-pay

## 2024-02-01 ENCOUNTER — Ambulatory Visit: Admitting: Cardiovascular Disease

## 2024-02-01 ENCOUNTER — Encounter: Payer: Self-pay | Admitting: Cardiovascular Disease

## 2024-02-01 VITALS — BP 122/75 | HR 65 | Ht 72.0 in | Wt 205.0 lb

## 2024-02-01 DIAGNOSIS — I8390 Asymptomatic varicose veins of unspecified lower extremity: Secondary | ICD-10-CM

## 2024-02-01 DIAGNOSIS — I10 Essential (primary) hypertension: Secondary | ICD-10-CM | POA: Diagnosis not present

## 2024-02-01 DIAGNOSIS — I25700 Atherosclerosis of coronary artery bypass graft(s), unspecified, with unstable angina pectoris: Secondary | ICD-10-CM | POA: Diagnosis not present

## 2024-02-01 DIAGNOSIS — E782 Mixed hyperlipidemia: Secondary | ICD-10-CM

## 2024-02-01 DIAGNOSIS — Z955 Presence of coronary angioplasty implant and graft: Secondary | ICD-10-CM

## 2024-02-01 DIAGNOSIS — I739 Peripheral vascular disease, unspecified: Secondary | ICD-10-CM

## 2024-02-01 DIAGNOSIS — K219 Gastro-esophageal reflux disease without esophagitis: Secondary | ICD-10-CM

## 2024-02-01 LAB — LIPOPROTEIN A (LPA): Lipoprotein (a): 24.6 nmol/L (ref <75.0)

## 2024-02-01 NOTE — Telephone Encounter (Signed)
 Pt had cath done on 7/17 and has woken up with a 6 inch bruise on his stomach. Pt wants to confirm this is normal and is scheduled to come in on Friday for a follow up.

## 2024-02-01 NOTE — Progress Notes (Signed)
 Cardiology Office Note   Date:  02/01/2024   ID:  [name], DOB 05-Jan-1936, MRN [id number]  PCP:  Orlean Alan HERO, FNP  Cardiologist:  Denyse Bathe, MD      History of Present Illness: [name] [name] is a 88 y.o. male who presents for  Chief Complaint  Patient presents with   Follow-up    Had bruises over right groin, no chest pain and no SOB. Feeling much better.      Past Medical History:  Diagnosis Date   Arthritis    Benign neoplasm of colon 04/13/2008   Overview:  Benign Polyps Of The Large Intestine - colonoscopy 2006,dr Graig Deal of this note might be different from the original. Formatting of this note might be different from the original. Benign Polyps Of The Large Intestine - colonoscopy 2006,dr Graig Deal of this note might be different from the original. Overview: Benign Polyps Of The Large Intestine - colonoscopy 2006,dr    Blurring of visual image 05/28/2018   Cancer (HCC) 1995   prostate ca    Chest pain 07/27/2016   Chest pain, unspecified 07/27/2016   Coronary artery disease    Esophageal stricture 08/2016   GERD (gastroesophageal reflux disease)    Glaucoma    History of hiatal hernia    Hx of CABG 02/24/2018   Formatting of this note might be different from the original. Two-vessel bypass. Formatting of this note might be different from the original. Formatting of this note might be different from the original. Two-vessel bypass. Formatting of this note might be different from the original. Formatting of this note might be different from the original. Two-vessel bypass.   Hypertension    Malignant neoplasm of prostate (HCC) 04/23/2017   MI (mitral incompetence)    MI, acute, non ST segment elevation (HCC) 10/05/2012   Formatting of this note might be different from the original. Note: Unchanged - x4   Old myocardial infarction 05/01/2019   Note: Unchanged - x4   Orthopedic aftercare 10/08/2017   Peripheral neuropathy     Peripheral vascular disease (HCC)    S/P shoulder replacement, left 09/25/2017   Skin sensation disturbance 06/23/2007   Formatting of this note might be different from the original. Tingling (Paresthesia) Formatting of this note might be different from the original. Formatting of this note might be different from the original. Tingling (Paresthesia)   Tear of meniscus of knee 11/11/2013   Formatting of this note might be different from the original. Formatting of this note might be different from the original. ICD-10 cut over Formatting of this note might be different from the original. Overview: ICD-10 cut over   Vertigo      Past Surgical History:  Procedure Laterality Date   COLONOSCOPY     COLONOSCOPY WITH PROPOFOL  N/A 03/27/2023   Procedure: COLONOSCOPY WITH PROPOFOL ;  Surgeon: Rollin Dover, MD;  Location: [facility];  Service: Gastroenterology;  Laterality: N/A;   CORONARY ANGIOPLASTY WITH STENT PLACEMENT  2005   x9    CORONARY ARTERY BYPASS GRAFT  2011   CORONARY BALLOON ANGIOPLASTY N/A 01/28/2024   Procedure: CORONARY BALLOON ANGIOPLASTY;  Surgeon: Florencio Cara BIRCH, MD;  Location: [facility] INVASIVE CV [facility];  Service: Cardiovascular;  Laterality: N/A;   CORONARY STENT PLACEMENT  07/2014   ESOPHAGOGASTRODUODENOSCOPY     ESOPHAGOGASTRODUODENOSCOPY (EGD) WITH ESOPHAGEAL DILATION  2018   knee scope Bilateral    LEFT HEART CATH AND CORONARY ANGIOGRAPHY Left 01/28/2024   Procedure: LEFT  HEART CATH AND CORONARY ANGIOGRAPHY with possible coronary intervention;  Surgeon: Fernand Denyse LABOR, MD;  Location: [facility]VASIVE CV LAB;  Service: Cardiovascular;  Laterality: Left;   LEG SURGERY Right 09/2018   LIPOMA EXCISION N/A 09/24/2016   Procedure: EXCISION SUBCUTANEOUS LIPOMA ON UPPER BACK;  Surgeon: Donnice Lima, MD;  Location: [facility] OR;  Service: General;  Laterality: N/A;   POLYPECTOMY  03/27/2023   Procedure: POLYPECTOMY;  Surgeon: Rollin Dover, MD;  Location: [facility];  Service:  Gastroenterology;;   Prostectomy  1995   REVERSE SHOULDER ARTHROPLASTY Left 09/25/2017   REVERSE SHOULDER ARTHROPLASTY Left 09/25/2017   Procedure: LEFT REVERSE SHOULDER ARTHROPLASTY; deltoid repair;  Surgeon: Kay Kemps, MD;  Location: [facility] OR;  Service: Orthopedics;  Laterality: Left;   ROTATOR CUFF REPAIR Left 2001     Current Outpatient Medications  Medication Sig Dispense Refill   acetaminophen  (TYLENOL ) 650 MG CR tablet Take 650 mg by mouth as needed for pain. 2 tablets in the morning and 2 tablets at bedtime     amLODipine  (NORVASC ) 10 MG tablet Take 1 tablet (10 mg total) by mouth daily. 90 tablet 3   aspirin  EC 81 MG tablet Take 81 mg by mouth daily.     azelastine  (OPTIVAR ) 0.05 % ophthalmic solution USE ONE DROP IN BOTH EYES TWICE DAILY 6 mL 5   calcium  carbonate (TUMS - DOSED IN MG ELEMENTAL CALCIUM ) 500 MG chewable tablet Chew 2 tablets by mouth daily as needed for indigestion or heartburn.     celecoxib  (CELEBREX ) 200 MG capsule TAKE 1 CAPSULE BY MOUTH ONCE DAILY AT NOON 30 capsule 5   cholecalciferol  (VITAMIN D ) 1000 units tablet Take 1,000 Units by mouth daily.     clopidogrel  (PLAVIX ) 75 MG tablet Take 1 tablet (75 mg total) by mouth daily with breakfast. 30 tablet 0   cycloSPORINE  (RESTASIS ) 0.05 % ophthalmic emulsion Place 1 drop into both eyes 2 (two) times daily.     fenofibrate  (TRICOR ) 145 MG tablet TAKE 1 TABLET BY MOUTH ONCE EVERY EVENING 90 tablet 3   fluticasone  (FLONASE ) 50 MCG/ACT nasal spray Place 1-2 sprays into both nostrils as needed. 16 g 1   furosemide  (LASIX ) 20 MG tablet Take 1 tablet (20 mg total) by mouth daily. 30 tablet 11   latanoprost  (XALATAN ) 0.005 % ophthalmic solution PLACE 1 DROP INTO AFFECTED EYE ONCE EVERY EVENING FOR GLUCOMA 2.5 mL 5   levocetirizine (XYZAL ) 5 MG tablet TAKE 1 TABLET BY MOUTH ONCE EVERY EVENING 90 tablet 1   lisinopril  (ZESTRIL ) 10 MG tablet TAKE 1 TABLET BY MOUTH ONCE DAILY 30 tablet 2   meclizine  (ANTIVERT ) 12.5 MG tablet  Take 1 tablet (12.5 mg total) by mouth daily. 30 tablet 2   metoprolol  succinate (TOPROL  XL) 25 MG 24 hr tablet Take 0.5 tablets (12.5 mg total) by mouth daily. 15 tablet 11   nitroGLYCERIN  (NITROSTAT ) 0.4 MG SL tablet Place 1 tablet (0.4 mg total) under the tongue every 5 (five) minutes as needed for chest pain. 30 tablet 3   nystatin-triamcinolone  (MYCOLOG II) cream APPLY TO AFFECTED AREA(s) TWICE DAILY 60 g 5   pantoprazole  (PROTONIX ) 40 MG tablet TAKE 1 TABLET BY MOUTH TWICE DAILY 60 tablet 6   Polyethyl Glycol-Propyl Glycol (SYSTANE OP) Apply 1 drop to eye 5 (five) times daily as needed (dry eyes).     pregabalin  (LYRICA ) 75 MG capsule TAKE 1 CAPSULE BY MOUTH TWICE DAILY 60 capsule 2   ranolazine  (RANEXA ) 500 MG 12 hr tablet Take  1 tablet (500 mg total) by mouth 2 (two) times daily. 60 tablet 2   REPATHA  SURECLICK 140 MG/ML SOAJ INJECT THE CONTENTS OF 1 PEN SUBCUTANEOUSLY EVERY 14 DAYS 6 mL 2   No current facility-administered medications for this visit.    Allergies:   Docosahexaenoic acid (dha) (fish), Lovaza [omega-3-acid ethyl esters (fish)], Atorvastatin, Hydrocodone, Ranolazine , Rosuvastatin, Statins, Folic acid, Folic acid-vit a3-cpu b12, and Pravastatin    Social History:   reports that he has never smoked. He has never been exposed to tobacco smoke. He has never used smokeless tobacco. He reports current alcohol  use. He reports that he does not use drugs.   Family History:  family history includes Alzheimer's disease in his mother; Cancer in his sister; Diabetes Mellitus II in his sister; Obesity in his sister; Parkinson's disease in his sister; Stroke in his father.    ROS:     Review of Systems  Constitutional: Negative.   HENT: Negative.    Eyes: Negative.   Respiratory: Negative.    Gastrointestinal: Negative.   Genitourinary: Negative.   Musculoskeletal: Negative.   Skin: Negative.   Neurological: Negative.   Endo/Heme/Allergies: Negative.   Psychiatric/Behavioral:  Negative.    All other systems reviewed and are negative.     All other systems are reviewed and negative.    PHYSICAL EXAM: VS:  BP 122/75   Pulse 65   Ht 6' (1.829 m)   Wt 205 lb (93 kg)   SpO2 97%   BMI 27.80 kg/m  , BMI Body mass index is 27.8 kg/m. Last weight:  Wt Readings from Last 3 Encounters:  02/01/24 205 lb (93 kg)  01/28/24 207 lb 3.2 oz (94 kg)  01/14/24 209 lb 12.8 oz (95.2 kg)     Physical Exam Vitals reviewed.  Constitutional:      Appearance: Normal appearance. He is normal weight.  HENT:     Head: Normocephalic.     Nose: Nose normal.     Mouth/Throat:     Mouth: Mucous membranes are moist.  Eyes:     Pupils: Pupils are equal, round, and reactive to light.  Cardiovascular:     Rate and Rhythm: Normal rate and regular rhythm.     Pulses: Normal pulses.     Heart sounds: Normal heart sounds.  Pulmonary:     Effort: Pulmonary effort is normal.  Abdominal:     General: Abdomen is flat. Bowel sounds are normal.  Musculoskeletal:        General: Normal range of motion.     Cervical back: Normal range of motion.  Skin:    General: Skin is warm.  Neurological:     General: No focal deficit present.     Mental Status: He is alert.  Psychiatric:        Mood and Affect: Mood normal.       EKG:   Recent Labs: 09/28/2023: TSH 0.448 01/14/2024: ALT 11 01/29/2024: BUN 24; Creatinine, Ser 1.12; Hemoglobin 13.1; Platelets 177; Potassium 3.8; Sodium 139    Lipid Panel    Component Value Date/Time   CHOL 103 09/28/2023 1150   TRIG 113 09/28/2023 1150   HDL 49 09/28/2023 1150   CHOLHDL 2.1 09/28/2023 1150   LDLCALC 33 09/28/2023 1150   LDLDIRECT 62 02/18/2021 0808      Other studies Reviewed: Additional studies/ records that were reviewed today include:  Review of the above records demonstrates:       No data to display  ASSESSMENT AND PLAN:    ICD-10-CM   1. Status post coronary artery balloon dilation  Z95.5    Take  asp 81 and plavix  75 mg and stop celebrex . take tylenol  for neck pains. Right groin mild echymosis, no hematoma. Vetrigo improved.    2. Asymptomatic varicose veins  I83.90     3. Coronary artery disease involving coronary bypass graft of native heart with unstable angina pectoris (HCC)  I25.700     4. Essential hypertension  I10     5. Peripheral vascular disease (HCC)  I73.9     6. Gastroesophageal reflux disease without esophagitis  K21.9     7. Mixed hyperlipidemia  E78.2        Problem List Items Addressed This Visit       Cardiovascular and Mediastinum   Asymptomatic varicose veins   Coronary artery disease involving coronary bypass graft of native heart with unstable angina pectoris (HCC)   Essential hypertension   Peripheral vascular disease (HCC)     Digestive   Gastroesophageal reflux disease without esophagitis     Other   Hyperlipidemia   Status post coronary artery balloon dilation - Primary       Disposition:   Return in about 4 weeks (around 02/29/2024).    Total time spent: 30 minutes  Signed,  Denyse Bathe, MD  02/01/2024 1:15 PM    [facility]

## 2024-02-02 ENCOUNTER — Telehealth: Payer: Self-pay | Admitting: Family

## 2024-02-02 NOTE — Telephone Encounter (Signed)
 Hadassah, PT with [facility], left VM requesting verbal orders for PT 2w3, 1w3.  Callback # [phone]

## 2024-02-03 NOTE — Telephone Encounter (Signed)
Verbal orders given to [facility]

## 2024-02-04 DIAGNOSIS — Z48812 Encounter for surgical aftercare following surgery on the circulatory system: Secondary | ICD-10-CM | POA: Diagnosis not present

## 2024-02-04 DIAGNOSIS — G5730 Lesion of lateral popliteal nerve, unspecified lower limb: Secondary | ICD-10-CM | POA: Diagnosis not present

## 2024-02-04 DIAGNOSIS — G629 Polyneuropathy, unspecified: Secondary | ICD-10-CM | POA: Diagnosis not present

## 2024-02-04 DIAGNOSIS — I25728 Atherosclerosis of autologous artery coronary artery bypass graft(s) with other forms of angina pectoris: Secondary | ICD-10-CM | POA: Diagnosis not present

## 2024-02-04 DIAGNOSIS — I739 Peripheral vascular disease, unspecified: Secondary | ICD-10-CM | POA: Diagnosis not present

## 2024-02-04 DIAGNOSIS — H353230 Exudative age-related macular degeneration, bilateral, stage unspecified: Secondary | ICD-10-CM | POA: Diagnosis not present

## 2024-02-04 DIAGNOSIS — I10 Essential (primary) hypertension: Secondary | ICD-10-CM | POA: Diagnosis not present

## 2024-02-04 DIAGNOSIS — I252 Old myocardial infarction: Secondary | ICD-10-CM | POA: Diagnosis not present

## 2024-02-04 DIAGNOSIS — I8390 Asymptomatic varicose veins of unspecified lower extremity: Secondary | ICD-10-CM | POA: Diagnosis not present

## 2024-02-05 ENCOUNTER — Ambulatory Visit: Admitting: Cardiovascular Disease

## 2024-02-08 ENCOUNTER — Ambulatory Visit (INDEPENDENT_AMBULATORY_CARE_PROVIDER_SITE_OTHER): Admitting: Family

## 2024-02-08 DIAGNOSIS — E538 Deficiency of other specified B group vitamins: Secondary | ICD-10-CM

## 2024-02-08 MED ORDER — CYANOCOBALAMIN 1000 MCG/ML IJ SOLN
1000.0000 ug | Freq: Once | INTRAMUSCULAR | Status: AC
  Administered 2024-02-08: 1000 ug via INTRAMUSCULAR

## 2024-02-08 NOTE — Progress Notes (Signed)
   CHIEF COMPLAINT  B12 Shot     REASON FOR VISIT  B12 Injection     ASSESSMENT  B12 Deficiency, Unspecified     PLAN  Diagnoses and all orders for this visit:  Vitamin B12 deficiency -     cyanocobalamin  (VITAMIN B12) injection 1,000 mcg     Pt. given B12 injection in clinic.  Return for next injection per provider instructions.   Total time spent: 5 minutes  ALAN CHRISTELLA ARRANT, FNP  02/08/2024

## 2024-02-09 DIAGNOSIS — M25562 Pain in left knee: Secondary | ICD-10-CM | POA: Diagnosis not present

## 2024-02-09 DIAGNOSIS — M1712 Unilateral primary osteoarthritis, left knee: Secondary | ICD-10-CM | POA: Diagnosis not present

## 2024-02-09 DIAGNOSIS — S8992XA Unspecified injury of left lower leg, initial encounter: Secondary | ICD-10-CM | POA: Diagnosis not present

## 2024-02-09 NOTE — Congregational Nurse Program (Signed)
  Dept: [phone]   Congregational Nurse Program Note  Date of Encounter: 02/09/2024  Past Medical History: Past Medical History:  Diagnosis Date   Arthritis    Benign neoplasm of colon 04/13/2008   Overview:  Benign Polyps Of The Large Intestine - colonoscopy 2006,dr Graig Deal of this note might be different from the original. Formatting of this note might be different from the original. Benign Polyps Of The Large Intestine - colonoscopy 2006,dr Graig Deal of this note might be different from the original. Overview: Benign Polyps Of The Large Intestine - colonoscopy 2006,dr    Blurring of visual image 05/28/2018   Cancer (HCC) 1995   prostate ca    Chest pain 07/27/2016   Chest pain, unspecified 07/27/2016   Coronary artery disease    Esophageal stricture 08/2016   GERD (gastroesophageal reflux disease)    Glaucoma    History of hiatal hernia    Hx of CABG 02/24/2018   Formatting of this note might be different from the original. Two-vessel bypass. Formatting of this note might be different from the original. Formatting of this note might be different from the original. Two-vessel bypass. Formatting of this note might be different from the original. Formatting of this note might be different from the original. Two-vessel bypass.   Hypertension    Malignant neoplasm of prostate (HCC) 04/23/2017   MI (mitral incompetence)    MI, acute, non ST segment elevation (HCC) 10/05/2012   Formatting of this note might be different from the original. Note: Unchanged - x4   Old myocardial infarction 05/01/2019   Note: Unchanged - x4   Orthopedic aftercare 10/08/2017   Peripheral neuropathy    Peripheral vascular disease (HCC)    S/P shoulder replacement, left 09/25/2017   Skin sensation disturbance 06/23/2007   Formatting of this note might be different from the original. Tingling (Paresthesia) Formatting of this note might be different from the original. Formatting of  this note might be different from the original. Tingling (Paresthesia)   Tear of meniscus of knee 11/11/2013   Formatting of this note might be different from the original. Formatting of this note might be different from the original. ICD-10 cut over Formatting of this note might be different from the original. Overview: ICD-10 cut over   Vertigo     Encounter Details:   Today's Vitals   02/09/24 1142  BP: 135/70   There is no height or weight on file to calculate BMI.;  Patient had heart cath 2 weeks ago and seems to be doing well.

## 2024-02-10 ENCOUNTER — Other Ambulatory Visit: Payer: Self-pay | Admitting: Family

## 2024-02-10 ENCOUNTER — Other Ambulatory Visit: Payer: Self-pay | Admitting: Cardiovascular Disease

## 2024-02-10 DIAGNOSIS — R55 Syncope and collapse: Secondary | ICD-10-CM

## 2024-02-10 DIAGNOSIS — R0602 Shortness of breath: Secondary | ICD-10-CM

## 2024-02-10 DIAGNOSIS — I252 Old myocardial infarction: Secondary | ICD-10-CM | POA: Diagnosis not present

## 2024-02-10 DIAGNOSIS — I25728 Atherosclerosis of autologous artery coronary artery bypass graft(s) with other forms of angina pectoris: Secondary | ICD-10-CM | POA: Diagnosis not present

## 2024-02-10 DIAGNOSIS — H353230 Exudative age-related macular degeneration, bilateral, stage unspecified: Secondary | ICD-10-CM | POA: Diagnosis not present

## 2024-02-10 DIAGNOSIS — G629 Polyneuropathy, unspecified: Secondary | ICD-10-CM | POA: Diagnosis not present

## 2024-02-10 DIAGNOSIS — I10 Essential (primary) hypertension: Secondary | ICD-10-CM

## 2024-02-10 DIAGNOSIS — E782 Mixed hyperlipidemia: Secondary | ICD-10-CM

## 2024-02-10 DIAGNOSIS — I739 Peripheral vascular disease, unspecified: Secondary | ICD-10-CM | POA: Diagnosis not present

## 2024-02-10 DIAGNOSIS — R001 Bradycardia, unspecified: Secondary | ICD-10-CM

## 2024-02-10 DIAGNOSIS — Z48812 Encounter for surgical aftercare following surgery on the circulatory system: Secondary | ICD-10-CM | POA: Diagnosis not present

## 2024-02-10 DIAGNOSIS — G5730 Lesion of lateral popliteal nerve, unspecified lower limb: Secondary | ICD-10-CM | POA: Diagnosis not present

## 2024-02-10 DIAGNOSIS — I8390 Asymptomatic varicose veins of unspecified lower extremity: Secondary | ICD-10-CM | POA: Diagnosis not present

## 2024-02-12 DIAGNOSIS — G5730 Lesion of lateral popliteal nerve, unspecified lower limb: Secondary | ICD-10-CM | POA: Diagnosis not present

## 2024-02-12 DIAGNOSIS — I739 Peripheral vascular disease, unspecified: Secondary | ICD-10-CM | POA: Diagnosis not present

## 2024-02-12 DIAGNOSIS — I25728 Atherosclerosis of autologous artery coronary artery bypass graft(s) with other forms of angina pectoris: Secondary | ICD-10-CM | POA: Diagnosis not present

## 2024-02-12 DIAGNOSIS — I252 Old myocardial infarction: Secondary | ICD-10-CM | POA: Diagnosis not present

## 2024-02-12 DIAGNOSIS — G629 Polyneuropathy, unspecified: Secondary | ICD-10-CM | POA: Diagnosis not present

## 2024-02-12 DIAGNOSIS — I8390 Asymptomatic varicose veins of unspecified lower extremity: Secondary | ICD-10-CM | POA: Diagnosis not present

## 2024-02-12 DIAGNOSIS — Z48812 Encounter for surgical aftercare following surgery on the circulatory system: Secondary | ICD-10-CM | POA: Diagnosis not present

## 2024-02-12 DIAGNOSIS — I10 Essential (primary) hypertension: Secondary | ICD-10-CM | POA: Diagnosis not present

## 2024-02-12 DIAGNOSIS — H353230 Exudative age-related macular degeneration, bilateral, stage unspecified: Secondary | ICD-10-CM | POA: Diagnosis not present

## 2024-02-17 DIAGNOSIS — H43813 Vitreous degeneration, bilateral: Secondary | ICD-10-CM | POA: Diagnosis not present

## 2024-02-17 DIAGNOSIS — H2513 Age-related nuclear cataract, bilateral: Secondary | ICD-10-CM | POA: Diagnosis not present

## 2024-02-17 DIAGNOSIS — H353122 Nonexudative age-related macular degeneration, left eye, intermediate dry stage: Secondary | ICD-10-CM | POA: Diagnosis not present

## 2024-02-17 DIAGNOSIS — H353211 Exudative age-related macular degeneration, right eye, with active choroidal neovascularization: Secondary | ICD-10-CM | POA: Diagnosis not present

## 2024-02-18 DIAGNOSIS — I10 Essential (primary) hypertension: Secondary | ICD-10-CM | POA: Diagnosis not present

## 2024-02-18 DIAGNOSIS — I252 Old myocardial infarction: Secondary | ICD-10-CM | POA: Diagnosis not present

## 2024-02-18 DIAGNOSIS — H353230 Exudative age-related macular degeneration, bilateral, stage unspecified: Secondary | ICD-10-CM | POA: Diagnosis not present

## 2024-02-18 DIAGNOSIS — G629 Polyneuropathy, unspecified: Secondary | ICD-10-CM | POA: Diagnosis not present

## 2024-02-18 DIAGNOSIS — G5730 Lesion of lateral popliteal nerve, unspecified lower limb: Secondary | ICD-10-CM | POA: Diagnosis not present

## 2024-02-18 DIAGNOSIS — I25728 Atherosclerosis of autologous artery coronary artery bypass graft(s) with other forms of angina pectoris: Secondary | ICD-10-CM | POA: Diagnosis not present

## 2024-02-18 DIAGNOSIS — Z48812 Encounter for surgical aftercare following surgery on the circulatory system: Secondary | ICD-10-CM | POA: Diagnosis not present

## 2024-02-18 DIAGNOSIS — I739 Peripheral vascular disease, unspecified: Secondary | ICD-10-CM | POA: Diagnosis not present

## 2024-02-18 DIAGNOSIS — I8390 Asymptomatic varicose veins of unspecified lower extremity: Secondary | ICD-10-CM | POA: Diagnosis not present

## 2024-02-19 DIAGNOSIS — Z48812 Encounter for surgical aftercare following surgery on the circulatory system: Secondary | ICD-10-CM | POA: Diagnosis not present

## 2024-02-19 DIAGNOSIS — I8390 Asymptomatic varicose veins of unspecified lower extremity: Secondary | ICD-10-CM | POA: Diagnosis not present

## 2024-02-19 DIAGNOSIS — I10 Essential (primary) hypertension: Secondary | ICD-10-CM | POA: Diagnosis not present

## 2024-02-19 DIAGNOSIS — H353230 Exudative age-related macular degeneration, bilateral, stage unspecified: Secondary | ICD-10-CM | POA: Diagnosis not present

## 2024-02-19 DIAGNOSIS — I25728 Atherosclerosis of autologous artery coronary artery bypass graft(s) with other forms of angina pectoris: Secondary | ICD-10-CM | POA: Diagnosis not present

## 2024-02-19 DIAGNOSIS — I252 Old myocardial infarction: Secondary | ICD-10-CM | POA: Diagnosis not present

## 2024-02-19 DIAGNOSIS — I739 Peripheral vascular disease, unspecified: Secondary | ICD-10-CM | POA: Diagnosis not present

## 2024-02-19 DIAGNOSIS — G629 Polyneuropathy, unspecified: Secondary | ICD-10-CM | POA: Diagnosis not present

## 2024-02-19 DIAGNOSIS — G5730 Lesion of lateral popliteal nerve, unspecified lower limb: Secondary | ICD-10-CM | POA: Diagnosis not present

## 2024-02-20 ENCOUNTER — Other Ambulatory Visit: Payer: Self-pay | Admitting: Family

## 2024-02-25 ENCOUNTER — Other Ambulatory Visit: Payer: Self-pay

## 2024-02-25 DIAGNOSIS — H353230 Exudative age-related macular degeneration, bilateral, stage unspecified: Secondary | ICD-10-CM | POA: Diagnosis not present

## 2024-02-25 DIAGNOSIS — G5730 Lesion of lateral popliteal nerve, unspecified lower limb: Secondary | ICD-10-CM | POA: Diagnosis not present

## 2024-02-25 DIAGNOSIS — I739 Peripheral vascular disease, unspecified: Secondary | ICD-10-CM | POA: Diagnosis not present

## 2024-02-25 DIAGNOSIS — I25728 Atherosclerosis of autologous artery coronary artery bypass graft(s) with other forms of angina pectoris: Secondary | ICD-10-CM | POA: Diagnosis not present

## 2024-02-25 DIAGNOSIS — G629 Polyneuropathy, unspecified: Secondary | ICD-10-CM | POA: Diagnosis not present

## 2024-02-25 DIAGNOSIS — I10 Essential (primary) hypertension: Secondary | ICD-10-CM | POA: Diagnosis not present

## 2024-02-25 DIAGNOSIS — Z48812 Encounter for surgical aftercare following surgery on the circulatory system: Secondary | ICD-10-CM | POA: Diagnosis not present

## 2024-02-25 DIAGNOSIS — I8390 Asymptomatic varicose veins of unspecified lower extremity: Secondary | ICD-10-CM | POA: Diagnosis not present

## 2024-02-25 DIAGNOSIS — I252 Old myocardial infarction: Secondary | ICD-10-CM | POA: Diagnosis not present

## 2024-03-01 ENCOUNTER — Other Ambulatory Visit: Payer: Self-pay

## 2024-03-01 DIAGNOSIS — H353230 Exudative age-related macular degeneration, bilateral, stage unspecified: Secondary | ICD-10-CM | POA: Diagnosis not present

## 2024-03-01 DIAGNOSIS — I252 Old myocardial infarction: Secondary | ICD-10-CM | POA: Diagnosis not present

## 2024-03-01 DIAGNOSIS — I10 Essential (primary) hypertension: Secondary | ICD-10-CM | POA: Diagnosis not present

## 2024-03-01 DIAGNOSIS — G629 Polyneuropathy, unspecified: Secondary | ICD-10-CM | POA: Diagnosis not present

## 2024-03-01 DIAGNOSIS — I8390 Asymptomatic varicose veins of unspecified lower extremity: Secondary | ICD-10-CM | POA: Diagnosis not present

## 2024-03-01 DIAGNOSIS — Z48812 Encounter for surgical aftercare following surgery on the circulatory system: Secondary | ICD-10-CM | POA: Diagnosis not present

## 2024-03-01 DIAGNOSIS — I739 Peripheral vascular disease, unspecified: Secondary | ICD-10-CM | POA: Diagnosis not present

## 2024-03-01 DIAGNOSIS — I25728 Atherosclerosis of autologous artery coronary artery bypass graft(s) with other forms of angina pectoris: Secondary | ICD-10-CM | POA: Diagnosis not present

## 2024-03-01 DIAGNOSIS — G5730 Lesion of lateral popliteal nerve, unspecified lower limb: Secondary | ICD-10-CM | POA: Diagnosis not present

## 2024-03-03 ENCOUNTER — Ambulatory Visit (INDEPENDENT_AMBULATORY_CARE_PROVIDER_SITE_OTHER): Admitting: Cardiovascular Disease

## 2024-03-03 ENCOUNTER — Encounter: Payer: Self-pay | Admitting: Cardiovascular Disease

## 2024-03-03 VITALS — BP 124/52 | HR 64 | Ht 72.0 in | Wt 206.0 lb

## 2024-03-03 DIAGNOSIS — I10 Essential (primary) hypertension: Secondary | ICD-10-CM

## 2024-03-03 DIAGNOSIS — R55 Syncope and collapse: Secondary | ICD-10-CM

## 2024-03-03 DIAGNOSIS — E782 Mixed hyperlipidemia: Secondary | ICD-10-CM | POA: Diagnosis not present

## 2024-03-03 DIAGNOSIS — R0602 Shortness of breath: Secondary | ICD-10-CM | POA: Diagnosis not present

## 2024-03-03 DIAGNOSIS — Z0120 Encounter for dental examination and cleaning without abnormal findings: Secondary | ICD-10-CM

## 2024-03-03 DIAGNOSIS — R001 Bradycardia, unspecified: Secondary | ICD-10-CM | POA: Diagnosis not present

## 2024-03-03 MED ORDER — CLOPIDOGREL BISULFATE 75 MG PO TABS: 75.0000 mg | ORAL_TABLET | Freq: Every day | ORAL | 2 refills | Status: AC

## 2024-03-03 NOTE — Progress Notes (Signed)
 Cardiology Office Note   Date:  03/03/2024   ID:  [name] [name], DOB 23-Jun-1936, MRN [id number]  PCP:  Orlean Alan HERO, FNP  Cardiologist:  Denyse Bathe, MD      History of Present Illness: [name] [name] is a 88 y.o. male who presents for  Chief Complaint  Patient presents with   Follow-up    4 week follow up    Doing well, no chest pain anymore.      Past Medical History:  Diagnosis Date   Arthritis    Benign neoplasm of colon 04/13/2008   Overview:  Benign Polyps Of The Large Intestine - colonoscopy 2006,dr Graig Deal of this note might be different from the original. Formatting of this note might be different from the original. Benign Polyps Of The Large Intestine - colonoscopy 2006,dr Graig Deal of this note might be different from the original. Overview: Benign Polyps Of The Large Intestine - colonoscopy 2006,dr    Blurring of visual image 05/28/2018   Cancer (HCC) 1995   prostate ca    Chest pain 07/27/2016   Chest pain, unspecified 07/27/2016   Coronary artery disease    Esophageal stricture 08/2016   GERD (gastroesophageal reflux disease)    Glaucoma    History of hiatal hernia    Hx of CABG 02/24/2018   Formatting of this note might be different from the original. Two-vessel bypass. Formatting of this note might be different from the original. Formatting of this note might be different from the original. Two-vessel bypass. Formatting of this note might be different from the original. Formatting of this note might be different from the original. Two-vessel bypass.   Hypertension    Malignant neoplasm of prostate (HCC) 04/23/2017   MI (mitral incompetence)    MI, acute, non ST segment elevation (HCC) 10/05/2012   Formatting of this note might be different from the original. Note: Unchanged - x4   Old myocardial infarction 05/01/2019   Note: Unchanged - x4   Orthopedic aftercare 10/08/2017   Peripheral neuropathy    Peripheral vascular  disease (HCC)    S/P shoulder replacement, left 09/25/2017   Skin sensation disturbance 06/23/2007   Formatting of this note might be different from the original. Tingling (Paresthesia) Formatting of this note might be different from the original. Formatting of this note might be different from the original. Tingling (Paresthesia)   Tear of meniscus of knee 11/11/2013   Formatting of this note might be different from the original. Formatting of this note might be different from the original. ICD-10 cut over Formatting of this note might be different from the original. Overview: ICD-10 cut over   Vertigo      Past Surgical History:  Procedure Laterality Date   COLONOSCOPY     COLONOSCOPY WITH PROPOFOL  N/A 03/27/2023   Procedure: COLONOSCOPY WITH PROPOFOL ;  Surgeon: Rollin Dover, MD;  Location: [facility];  Service: Gastroenterology;  Laterality: N/A;   CORONARY ANGIOPLASTY WITH STENT PLACEMENT  2005   x9    CORONARY ARTERY BYPASS GRAFT  2011   CORONARY BALLOON ANGIOPLASTY N/A 01/28/2024   Procedure: CORONARY BALLOON ANGIOPLASTY;  Surgeon: Florencio Cara BIRCH, MD;  Location: [facility];  Service: Cardiovascular;  Laterality: N/A;   CORONARY STENT PLACEMENT  07/2014   ESOPHAGOGASTRODUODENOSCOPY     ESOPHAGOGASTRODUODENOSCOPY (EGD) WITH ESOPHAGEAL DILATION  2018   knee scope Bilateral    LEFT HEART CATH AND CORONARY ANGIOGRAPHY Left 01/28/2024   Procedure: LEFT HEART  CATH AND CORONARY ANGIOGRAPHY with possible coronary intervention;  Surgeon: Fernand Denyse LABOR, MD;  Location: [facility];  Service: Cardiovascular;  Laterality: Left;   LEG SURGERY Right 09/2018   LIPOMA EXCISION N/A 09/24/2016   Procedure: EXCISION SUBCUTANEOUS LIPOMA ON UPPER BACK;  Surgeon: Donnice Lima, MD;  Location: [facility];  Service: General;  Laterality: N/A;   POLYPECTOMY  03/27/2023   Procedure: POLYPECTOMY;  Surgeon: Rollin Dover, MD;  Location: [facility];  Service: Gastroenterology;;   Prostectomy   1995   REVERSE SHOULDER ARTHROPLASTY Left 09/25/2017   REVERSE SHOULDER ARTHROPLASTY Left 09/25/2017   Procedure: LEFT REVERSE SHOULDER ARTHROPLASTY; deltoid repair;  Surgeon: Kay Kemps, MD;  Location: [facility];  Service: Orthopedics;  Laterality: Left;   ROTATOR CUFF REPAIR Left 2001     Current Outpatient Medications  Medication Sig Dispense Refill   acetaminophen  (TYLENOL ) 650 MG CR tablet Take 650 mg by mouth as needed for pain. 2 tablets in the morning and 2 tablets at bedtime     amLODipine  (NORVASC ) 10 MG tablet Take 1 tablet (10 mg total) by mouth daily. 90 tablet 3   aspirin  EC 81 MG tablet Take 81 mg by mouth daily.     azelastine  (OPTIVAR ) 0.05 % ophthalmic solution USE ONE DROP IN BOTH EYES TWICE DAILY 6 mL 5   calcium  carbonate (TUMS - DOSED IN MG ELEMENTAL CALCIUM ) 500 MG chewable tablet Chew 2 tablets by mouth daily as needed for indigestion or heartburn.     celecoxib  (CELEBREX ) 200 MG capsule TAKE 1 CAPSULE BY MOUTH ONCE DAILY AT NOON 30 capsule 5   cholecalciferol  (VITAMIN D ) 1000 units tablet Take 1,000 Units by mouth daily.     cycloSPORINE  (RESTASIS ) 0.05 % ophthalmic emulsion Place 1 drop into both eyes 2 (two) times daily.     fenofibrate  (TRICOR ) 145 MG tablet TAKE 1 TABLET BY MOUTH ONCE EVERY EVENING 90 tablet 3   fluticasone  (FLONASE ) 50 MCG/ACT nasal spray PLACE 1-2 SPRAYS INTO BOTH NOSTRILS AS NEEDED 16 g 1   furosemide  (LASIX ) 20 MG tablet Take 1 tablet (20 mg total) by mouth daily. 30 tablet 11   latanoprost  (XALATAN ) 0.005 % ophthalmic solution PLACE 1 DROP INTO AFFECTED EYE ONCE EVERY EVENING FOR GLUCOMA 2.5 mL 5   levocetirizine (XYZAL ) 5 MG tablet TAKE 1 TABLET BY MOUTH ONCE EVERY EVENING 90 tablet 1   lisinopril  (ZESTRIL ) 10 MG tablet TAKE 1 TABLET BY MOUTH ONCE DAILY 30 tablet 2   meclizine  (ANTIVERT ) 12.5 MG tablet Take 1 tablet (12.5 mg total) by mouth daily. 30 tablet 2   metoprolol  succinate (TOPROL  XL) 25 MG 24 hr tablet Take 0.5 tablets (12.5 mg  total) by mouth daily. 15 tablet 11   nitroGLYCERIN  (NITROSTAT ) 0.4 MG SL tablet Place 1 tablet (0.4 mg total) under the tongue every 5 (five) minutes as needed for chest pain. 30 tablet 3   nystatin-triamcinolone  (MYCOLOG II) cream APPLY TO AFFECTED AREA(s) TWICE DAILY 60 g 5   pantoprazole  (PROTONIX ) 40 MG tablet TAKE 1 TABLET BY MOUTH TWICE DAILY 60 tablet 6   Polyethyl Glycol-Propyl Glycol (SYSTANE OP) Apply 1 drop to eye 5 (five) times daily as needed (dry eyes).     pregabalin  (LYRICA ) 75 MG capsule TAKE 1 CAPSULE BY MOUTH TWICE DAILY 60 capsule 1   ranolazine  (RANEXA ) 500 MG 12 hr tablet Take 1 tablet (500 mg total) by mouth 2 (two) times daily. 60 tablet 2   REPATHA  SURECLICK 140 MG/ML SOAJ INJECT  THE CONTENTS OF 1 PEN SUBCUTANEOUSLY EVERY 14 DAYS 6 mL 2   clopidogrel  (PLAVIX ) 75 MG tablet Take 1 tablet (75 mg total) by mouth daily with breakfast. 90 tablet 2   No current facility-administered medications for this visit.    Allergies:   Docosahexaenoic acid (dha) (fish), Lovaza [omega-3-acid ethyl esters (fish)], Atorvastatin, Hydrocodone, Ranolazine , Rosuvastatin, Statins, Folic acid, Folic acid-vit a3-cpu b12, and Pravastatin    Social History:   reports that he has never smoked. He has never been exposed to tobacco smoke. He has never used smokeless tobacco. He reports current alcohol  use. He reports that he does not use drugs.   Family History:  family history includes Alzheimer's disease in his mother; Cancer in his sister; Diabetes Mellitus II in his sister; Obesity in his sister; Parkinson's disease in his sister; Stroke in his father.    ROS:     Review of Systems  Constitutional: Negative.   HENT: Negative.    Eyes: Negative.   Respiratory: Negative.    Gastrointestinal: Negative.   Genitourinary: Negative.   Musculoskeletal: Negative.   Skin: Negative.   Neurological: Negative.   Endo/Heme/Allergies: Negative.   Psychiatric/Behavioral: Negative.    All other  systems reviewed and are negative.     All other systems are reviewed and negative.    PHYSICAL EXAM: VS:  BP (!) 124/52   Pulse 64   Ht 6' (1.829 m)   Wt 206 lb (93.4 kg)   SpO2 93%   BMI 27.94 kg/m  , BMI Body mass index is 27.94 kg/m. Last weight:  Wt Readings from Last 3 Encounters:  03/03/24 206 lb (93.4 kg)  02/01/24 205 lb (93 kg)  01/28/24 207 lb 3.2 oz (94 kg)     Physical Exam Vitals reviewed.  Constitutional:      Appearance: Normal appearance. He is normal weight.  HENT:     Head: Normocephalic.     Nose: Nose normal.     Mouth/Throat:     Mouth: Mucous membranes are moist.  Eyes:     Pupils: Pupils are equal, round, and reactive to light.  Cardiovascular:     Rate and Rhythm: Normal rate and regular rhythm.     Pulses: Normal pulses.     Heart sounds: Normal heart sounds.  Pulmonary:     Effort: Pulmonary effort is normal.  Abdominal:     General: Abdomen is flat. Bowel sounds are normal.  Musculoskeletal:        General: Normal range of motion.     Cervical back: Normal range of motion.  Skin:    General: Skin is warm.  Neurological:     General: No focal deficit present.     Mental Status: He is alert.  Psychiatric:        Mood and Affect: Mood normal.       EKG:   Recent Labs: 09/28/2023: TSH 0.448 01/14/2024: ALT 11 01/29/2024: BUN 24; Creatinine, Ser 1.12; Hemoglobin 13.1; Platelets 177; Potassium 3.8; Sodium 139    Lipid Panel    Component Value Date/Time   CHOL 103 09/28/2023 1150   TRIG 113 09/28/2023 1150   HDL 49 09/28/2023 1150   CHOLHDL 2.1 09/28/2023 1150   LDLCALC 33 09/28/2023 1150   LDLDIRECT 62 02/18/2021 0808      Other studies Reviewed: Additional studies/ records that were reviewed today include:  Review of the above records demonstrates:       No data to display  ASSESSMENT AND PLAN:    ICD-10-CM   1. SOB (shortness of breath)  R06.02 clopidogrel  (PLAVIX ) 75 MG tablet   Feeling fine     2. Sinus bradycardia  R00.1 clopidogrel  (PLAVIX ) 75 MG tablet    3. Essential hypertension  I10 clopidogrel  (PLAVIX ) 75 MG tablet    4. Mixed hyperlipidemia  E78.2 clopidogrel  (PLAVIX ) 75 MG tablet    5. Syncope, unspecified syncope type  R55 clopidogrel  (PLAVIX ) 75 MG tablet    6. Encounter for dental examination and cleaning without abnormal findings  Z01.20 clopidogrel  (PLAVIX ) 75 MG tablet   May have dental cleaning       Problem List Items Addressed This Visit       Cardiovascular and Mediastinum   Essential hypertension   Relevant Medications   clopidogrel  (PLAVIX ) 75 MG tablet     Other   Hyperlipidemia   Relevant Medications   clopidogrel  (PLAVIX ) 75 MG tablet   Other Visit Diagnoses       SOB (shortness of breath)    -  Primary   Feeling fine   Relevant Medications   clopidogrel  (PLAVIX ) 75 MG tablet     Sinus bradycardia       Relevant Medications   clopidogrel  (PLAVIX ) 75 MG tablet     Syncope, unspecified syncope type       Relevant Medications   clopidogrel  (PLAVIX ) 75 MG tablet     Encounter for dental examination and cleaning without abnormal findings       May have dental cleaning   Relevant Medications   clopidogrel  (PLAVIX ) 75 MG tablet          Disposition:   Return in about 2 months (around 05/03/2024) for Print todays note as it has clearance for dental cleaning.    Total time spent: 30 minutes  Signed,  Denyse Bathe, MD  03/03/2024 11:16 AM    [facility]

## 2024-03-08 LAB — GLUCOSE, POCT (MANUAL RESULT ENTRY): POC Glucose: 111 mg/dL — AB (ref 70–99)

## 2024-03-08 NOTE — Congregational Nurse Program (Signed)
 Dept: [phone]   Congregational Nurse Program Note  Date of Encounter: 03/08/2024  When asked about his falls history, patient says he has had 60 falls in 3 years and for now he has physical therapist that comes out to his home weekly.  Has emotional support dog AMBER that provides a lot of company to him. Provided patient with resource for weekly senior meal calendar in the area with the hopes that he will enjoy the social aspect of this, he seemed to be very interested.    Past Medical History: Past Medical History:  Diagnosis Date   Arthritis    Benign neoplasm of colon 04/13/2008   Overview:  Benign Polyps Of The Large Intestine - colonoscopy 2006,dr Graig Deal of this note might be different from the original. Formatting of this note might be different from the original. Benign Polyps Of The Large Intestine - colonoscopy 2006,dr Graig Deal of this note might be different from the original. Overview: Benign Polyps Of The Large Intestine - colonoscopy 2006,dr    Blurring of visual image 05/28/2018   Cancer (HCC) 1995   prostate ca    Chest pain 07/27/2016   Chest pain, unspecified 07/27/2016   Coronary artery disease    Esophageal stricture 08/2016   GERD (gastroesophageal reflux disease)    Glaucoma    History of hiatal hernia    Hx of CABG 02/24/2018   Formatting of this note might be different from the original. Two-vessel bypass. Formatting of this note might be different from the original. Formatting of this note might be different from the original. Two-vessel bypass. Formatting of this note might be different from the original. Formatting of this note might be different from the original. Two-vessel bypass.   Hypertension    Malignant neoplasm of prostate (HCC) 04/23/2017   MI (mitral incompetence)    MI, acute, non ST segment elevation (HCC) 10/05/2012   Formatting of this note might be different from the original. Note: Unchanged - x4   Old  myocardial infarction 05/01/2019   Note: Unchanged - x4   Orthopedic aftercare 10/08/2017   Peripheral neuropathy    Peripheral vascular disease (HCC)    S/P shoulder replacement, left 09/25/2017   Skin sensation disturbance 06/23/2007   Formatting of this note might be different from the original. Tingling (Paresthesia) Formatting of this note might be different from the original. Formatting of this note might be different from the original. Tingling (Paresthesia)   Tear of meniscus of knee 11/11/2013   Formatting of this note might be different from the original. Formatting of this note might be different from the original. ICD-10 cut over Formatting of this note might be different from the original. Overview: ICD-10 cut over   Vertigo     Encounter Details:  Community Questionnaire - 03/08/24 1559       Questionnaire   Ask client: Do you give verbal consent for me to treat you today? Yes    Student Assistance N/A    Location Patient Served  [facility]    Encounter Setting CN site    Population Status Unknown    Insurance Medicare    Insurance/Financial Assistance Referral N/A    Medication N/A    Medical Provider Yes    Screening Referrals Made N/A    Medical Referrals Made N/A    Medical Appointment Completed N/A    CNP Interventions Advocate/Support    Screenings CN Performed Blood Pressure;Blood Glucose    ED Visit Averted N/A  Life-Saving Intervention Made N/A

## 2024-03-09 DIAGNOSIS — I739 Peripheral vascular disease, unspecified: Secondary | ICD-10-CM | POA: Diagnosis not present

## 2024-03-09 DIAGNOSIS — G5730 Lesion of lateral popliteal nerve, unspecified lower limb: Secondary | ICD-10-CM | POA: Diagnosis not present

## 2024-03-09 DIAGNOSIS — G629 Polyneuropathy, unspecified: Secondary | ICD-10-CM | POA: Diagnosis not present

## 2024-03-09 DIAGNOSIS — I10 Essential (primary) hypertension: Secondary | ICD-10-CM | POA: Diagnosis not present

## 2024-03-09 DIAGNOSIS — Z48812 Encounter for surgical aftercare following surgery on the circulatory system: Secondary | ICD-10-CM | POA: Diagnosis not present

## 2024-03-09 DIAGNOSIS — I8390 Asymptomatic varicose veins of unspecified lower extremity: Secondary | ICD-10-CM | POA: Diagnosis not present

## 2024-03-09 DIAGNOSIS — I252 Old myocardial infarction: Secondary | ICD-10-CM | POA: Diagnosis not present

## 2024-03-09 DIAGNOSIS — H353230 Exudative age-related macular degeneration, bilateral, stage unspecified: Secondary | ICD-10-CM | POA: Diagnosis not present

## 2024-03-09 DIAGNOSIS — I25728 Atherosclerosis of autologous artery coronary artery bypass graft(s) with other forms of angina pectoris: Secondary | ICD-10-CM | POA: Diagnosis not present

## 2024-03-10 ENCOUNTER — Ambulatory Visit (INDEPENDENT_AMBULATORY_CARE_PROVIDER_SITE_OTHER): Admitting: Internal Medicine

## 2024-03-10 ENCOUNTER — Encounter: Payer: Self-pay | Admitting: Internal Medicine

## 2024-03-10 DIAGNOSIS — E538 Deficiency of other specified B group vitamins: Secondary | ICD-10-CM | POA: Diagnosis not present

## 2024-03-10 MED ORDER — CYANOCOBALAMIN 1000 MCG/ML IJ SOLN
1000.0000 ug | Freq: Once | INTRAMUSCULAR | Status: AC
  Administered 2024-03-10: 1000 ug via INTRAMUSCULAR

## 2024-03-10 NOTE — Progress Notes (Signed)
Vitamin B-12 injection given 

## 2024-03-11 ENCOUNTER — Other Ambulatory Visit: Payer: Self-pay | Admitting: Family

## 2024-03-11 ENCOUNTER — Other Ambulatory Visit: Payer: Self-pay | Admitting: Cardiovascular Disease

## 2024-03-11 DIAGNOSIS — E782 Mixed hyperlipidemia: Secondary | ICD-10-CM

## 2024-03-11 DIAGNOSIS — R0789 Other chest pain: Secondary | ICD-10-CM

## 2024-03-11 DIAGNOSIS — R0602 Shortness of breath: Secondary | ICD-10-CM

## 2024-03-11 DIAGNOSIS — I10 Essential (primary) hypertension: Secondary | ICD-10-CM

## 2024-03-11 DIAGNOSIS — R001 Bradycardia, unspecified: Secondary | ICD-10-CM

## 2024-03-16 DIAGNOSIS — I252 Old myocardial infarction: Secondary | ICD-10-CM | POA: Diagnosis not present

## 2024-03-16 DIAGNOSIS — I739 Peripheral vascular disease, unspecified: Secondary | ICD-10-CM | POA: Diagnosis not present

## 2024-03-16 DIAGNOSIS — G629 Polyneuropathy, unspecified: Secondary | ICD-10-CM | POA: Diagnosis not present

## 2024-03-16 DIAGNOSIS — I25728 Atherosclerosis of autologous artery coronary artery bypass graft(s) with other forms of angina pectoris: Secondary | ICD-10-CM | POA: Diagnosis not present

## 2024-03-16 DIAGNOSIS — I10 Essential (primary) hypertension: Secondary | ICD-10-CM | POA: Diagnosis not present

## 2024-03-16 DIAGNOSIS — H353230 Exudative age-related macular degeneration, bilateral, stage unspecified: Secondary | ICD-10-CM | POA: Diagnosis not present

## 2024-03-16 DIAGNOSIS — I8390 Asymptomatic varicose veins of unspecified lower extremity: Secondary | ICD-10-CM | POA: Diagnosis not present

## 2024-03-16 DIAGNOSIS — Z48812 Encounter for surgical aftercare following surgery on the circulatory system: Secondary | ICD-10-CM | POA: Diagnosis not present

## 2024-03-16 DIAGNOSIS — G5730 Lesion of lateral popliteal nerve, unspecified lower limb: Secondary | ICD-10-CM | POA: Diagnosis not present

## 2024-03-23 NOTE — Congregational Nurse Program (Signed)
  Dept: [phone]   Congregational Nurse Program Note  Date of Encounter: 03/22/2024  Past Medical History: Past Medical History:  Diagnosis Date   Arthritis    Benign neoplasm of colon 04/13/2008   Overview:  Benign Polyps Of The Large Intestine - colonoscopy 2006,dr Graig Deal of this note might be different from the original. Formatting of this note might be different from the original. Benign Polyps Of The Large Intestine - colonoscopy 2006,dr Graig Deal of this note might be different from the original. Overview: Benign Polyps Of The Large Intestine - colonoscopy 2006,dr    Blurring of visual image 05/28/2018   Cancer (HCC) 1995   prostate ca    Chest pain 07/27/2016   Chest pain, unspecified 07/27/2016   Coronary artery disease    Esophageal stricture 08/2016   GERD (gastroesophageal reflux disease)    Glaucoma    History of hiatal hernia    Hx of CABG 02/24/2018   Formatting of this note might be different from the original. Two-vessel bypass. Formatting of this note might be different from the original. Formatting of this note might be different from the original. Two-vessel bypass. Formatting of this note might be different from the original. Formatting of this note might be different from the original. Two-vessel bypass.   Hypertension    Malignant neoplasm of prostate (HCC) 04/23/2017   MI (mitral incompetence)    MI, acute, non ST segment elevation (HCC) 10/05/2012   Formatting of this note might be different from the original. Note: Unchanged - x4   Old myocardial infarction 05/01/2019   Note: Unchanged - x4   Orthopedic aftercare 10/08/2017   Peripheral neuropathy    Peripheral vascular disease (HCC)    S/P shoulder replacement, left 09/25/2017   Skin sensation disturbance 06/23/2007   Formatting of this note might be different from the original. Tingling (Paresthesia) Formatting of this note might be different from the original. Formatting of  this note might be different from the original. Tingling (Paresthesia)   Tear of meniscus of knee 11/11/2013   Formatting of this note might be different from the original. Formatting of this note might be different from the original. ICD-10 cut over Formatting of this note might be different from the original. Overview: ICD-10 cut over   Vertigo     Encounter Details:  Community Questionnaire - 03/22/24 1042       Questionnaire   Ask client: Do you give verbal consent for me to treat you today? Yes    Student Assistance N/A    Location Patient Served  [facility]    Encounter Setting CN site    Population Status Unknown    Insurance Medicare    Insurance/Financial Assistance Referral N/A    Medical Provider Yes    Screening Referrals Made N/A    Medical Referrals Made N/A    Medical Appointment Completed N/A    CNP Interventions Advocate/Support    Screenings CN Performed Blood Pressure    ED Visit Averted N/A    Life-Saving Intervention Made N/A

## 2024-03-25 ENCOUNTER — Other Ambulatory Visit: Payer: Self-pay | Admitting: Family

## 2024-03-25 ENCOUNTER — Telehealth: Payer: Self-pay

## 2024-03-25 DIAGNOSIS — I25728 Atherosclerosis of autologous artery coronary artery bypass graft(s) with other forms of angina pectoris: Secondary | ICD-10-CM | POA: Diagnosis not present

## 2024-03-25 DIAGNOSIS — H353230 Exudative age-related macular degeneration, bilateral, stage unspecified: Secondary | ICD-10-CM | POA: Diagnosis not present

## 2024-03-25 DIAGNOSIS — I8390 Asymptomatic varicose veins of unspecified lower extremity: Secondary | ICD-10-CM | POA: Diagnosis not present

## 2024-03-25 DIAGNOSIS — G5730 Lesion of lateral popliteal nerve, unspecified lower limb: Secondary | ICD-10-CM | POA: Diagnosis not present

## 2024-03-25 DIAGNOSIS — I739 Peripheral vascular disease, unspecified: Secondary | ICD-10-CM | POA: Diagnosis not present

## 2024-03-25 DIAGNOSIS — Z48812 Encounter for surgical aftercare following surgery on the circulatory system: Secondary | ICD-10-CM | POA: Diagnosis not present

## 2024-03-25 DIAGNOSIS — G629 Polyneuropathy, unspecified: Secondary | ICD-10-CM | POA: Diagnosis not present

## 2024-03-25 DIAGNOSIS — I10 Essential (primary) hypertension: Secondary | ICD-10-CM | POA: Diagnosis not present

## 2024-03-25 DIAGNOSIS — I252 Old myocardial infarction: Secondary | ICD-10-CM | POA: Diagnosis not present

## 2024-03-25 NOTE — Telephone Encounter (Signed)
 Pt called and left vm requesting a call back as soon as possible regarding sending orders/authorize to continue HH. Stated he wanted to speak with Alan regarding this, but Please advise

## 2024-03-30 DIAGNOSIS — G5730 Lesion of lateral popliteal nerve, unspecified lower limb: Secondary | ICD-10-CM | POA: Diagnosis not present

## 2024-03-30 DIAGNOSIS — H353230 Exudative age-related macular degeneration, bilateral, stage unspecified: Secondary | ICD-10-CM | POA: Diagnosis not present

## 2024-03-30 DIAGNOSIS — G629 Polyneuropathy, unspecified: Secondary | ICD-10-CM | POA: Diagnosis not present

## 2024-03-30 DIAGNOSIS — I8390 Asymptomatic varicose veins of unspecified lower extremity: Secondary | ICD-10-CM | POA: Diagnosis not present

## 2024-03-30 DIAGNOSIS — Z48812 Encounter for surgical aftercare following surgery on the circulatory system: Secondary | ICD-10-CM | POA: Diagnosis not present

## 2024-03-30 DIAGNOSIS — I252 Old myocardial infarction: Secondary | ICD-10-CM | POA: Diagnosis not present

## 2024-03-30 DIAGNOSIS — I10 Essential (primary) hypertension: Secondary | ICD-10-CM | POA: Diagnosis not present

## 2024-03-30 DIAGNOSIS — I739 Peripheral vascular disease, unspecified: Secondary | ICD-10-CM | POA: Diagnosis not present

## 2024-03-30 DIAGNOSIS — I25728 Atherosclerosis of autologous artery coronary artery bypass graft(s) with other forms of angina pectoris: Secondary | ICD-10-CM | POA: Diagnosis not present

## 2024-03-31 ENCOUNTER — Telehealth: Payer: Self-pay | Admitting: Family

## 2024-03-31 ENCOUNTER — Encounter: Payer: Self-pay | Admitting: Cardiology

## 2024-03-31 ENCOUNTER — Ambulatory Visit (INDEPENDENT_AMBULATORY_CARE_PROVIDER_SITE_OTHER): Admitting: Cardiology

## 2024-03-31 VITALS — BP 117/72 | HR 61 | Ht 72.0 in | Wt 206.4 lb

## 2024-03-31 DIAGNOSIS — R2232 Localized swelling, mass and lump, left upper limb: Secondary | ICD-10-CM | POA: Diagnosis not present

## 2024-03-31 DIAGNOSIS — S40021A Contusion of right upper arm, initial encounter: Secondary | ICD-10-CM | POA: Diagnosis not present

## 2024-03-31 DIAGNOSIS — W19XXXA Unspecified fall, initial encounter: Secondary | ICD-10-CM | POA: Insufficient documentation

## 2024-03-31 DIAGNOSIS — E538 Deficiency of other specified B group vitamins: Secondary | ICD-10-CM

## 2024-03-31 DIAGNOSIS — Z0130 Encounter for examination of blood pressure without abnormal findings: Secondary | ICD-10-CM

## 2024-03-31 DIAGNOSIS — Y92009 Unspecified place in unspecified non-institutional (private) residence as the place of occurrence of the external cause: Secondary | ICD-10-CM | POA: Diagnosis not present

## 2024-03-31 MED ORDER — CYANOCOBALAMIN 1000 MCG/ML IJ SOLN
1000.0000 ug | Freq: Once | INTRAMUSCULAR | Status: AC
  Administered 2024-03-31: 1000 ug via INTRAMUSCULAR

## 2024-03-31 NOTE — Telephone Encounter (Signed)
 Phu, PT with [facility], left VM requesting verbal orders for PT 1w9.  Per Alan verbal, these home health orders are okay.   [employer] [phone]

## 2024-03-31 NOTE — Progress Notes (Signed)
 Established Patient Office Visit  Subjective:  Patient ID: [name] [name], male    DOB: 04/25/1936  Age: 88 y.o. MRN: [id number]  Chief Complaint  Patient presents with   Acute Visit    Bruising on right arm from fall. Big knot on elbow.     Patient in office for an acute visit, patient fell on . Patient complaining of bruising on right arm, knot on elbow.  Patient reports he fell out his front door about 4 weeks ago. Bruising and skin tears visible on right arm. Two weeks after fall, patient developed a knot on his elbow. On exam, right elbow appears to have a collection of fluid. Unable to treat in office. Recommend patient go to urgent care for xray and possible I and D.      No other concerns at this time.   Past Medical History:  Diagnosis Date   Arthritis    Benign neoplasm of colon 04/13/2008   Overview:  Benign Polyps Of The Large Intestine - colonoscopy 2006,dr Graig Deal of this note might be different from the original. Formatting of this note might be different from the original. Benign Polyps Of The Large Intestine - colonoscopy 2006,dr Graig Deal of this note might be different from the original. Overview: Benign Polyps Of The Large Intestine - colonoscopy 2006,dr    Blurring of visual image 05/28/2018   Cancer (HCC) 1995   prostate ca    Chest pain 07/27/2016   Chest pain, unspecified 07/27/2016   Coronary artery disease    Esophageal stricture 08/2016   GERD (gastroesophageal reflux disease)    Glaucoma    History of hiatal hernia    Hx of CABG 02/24/2018   Formatting of this note might be different from the original. Two-vessel bypass. Formatting of this note might be different from the original. Formatting of this note might be different from the original. Two-vessel bypass. Formatting of this note might be different from the original. Formatting of this note might be different from the original. Two-vessel bypass.   Hypertension    Malignant  neoplasm of prostate (HCC) 04/23/2017   MI (mitral incompetence)    MI, acute, non ST segment elevation (HCC) 10/05/2012   Formatting of this note might be different from the original. Note: Unchanged - x4   Old myocardial infarction 05/01/2019   Note: Unchanged - x4   Orthopedic aftercare 10/08/2017   Peripheral neuropathy    Peripheral vascular disease (HCC)    S/P shoulder replacement, left 09/25/2017   Skin sensation disturbance 06/23/2007   Formatting of this note might be different from the original. Tingling (Paresthesia) Formatting of this note might be different from the original. Formatting of this note might be different from the original. Tingling (Paresthesia)   Tear of meniscus of knee 11/11/2013   Formatting of this note might be different from the original. Formatting of this note might be different from the original. ICD-10 cut over Formatting of this note might be different from the original. Overview: ICD-10 cut over   Vertigo     Past Surgical History:  Procedure Laterality Date   COLONOSCOPY     COLONOSCOPY WITH PROPOFOL  N/A 03/27/2023   Procedure: COLONOSCOPY WITH PROPOFOL ;  Surgeon: Rollin Dover, MD;  Location: [facility];  Service: Gastroenterology;  Laterality: N/A;   CORONARY ANGIOPLASTY WITH STENT PLACEMENT  2005   x9    CORONARY ARTERY BYPASS GRAFT  2011   CORONARY BALLOON ANGIOPLASTY N/A 01/28/2024   Procedure: CORONARY  BALLOON ANGIOPLASTY;  Surgeon: Florencio Cara BIRCH, MD;  Location: [facility];  Service: Cardiovascular;  Laterality: N/A;   CORONARY STENT PLACEMENT  07/2014   ESOPHAGOGASTRODUODENOSCOPY     ESOPHAGOGASTRODUODENOSCOPY (EGD) WITH ESOPHAGEAL DILATION  2018   knee scope Bilateral    LEFT HEART CATH AND CORONARY ANGIOGRAPHY Left 01/28/2024   Procedure: LEFT HEART CATH AND CORONARY ANGIOGRAPHY with possible coronary intervention;  Surgeon: Fernand Denyse LABOR, MD;  Location: [facility];  Service: Cardiovascular;  Laterality:  Left;   LEG SURGERY Right 09/2018   LIPOMA EXCISION N/A 09/24/2016   Procedure: EXCISION SUBCUTANEOUS LIPOMA ON UPPER BACK;  Surgeon: Donnice Lima, MD;  Location: [facility];  Service: General;  Laterality: N/A;   POLYPECTOMY  03/27/2023   Procedure: POLYPECTOMY;  Surgeon: Rollin Dover, MD;  Location: [facility];  Service: Gastroenterology;;   Prostectomy  1995   REVERSE SHOULDER ARTHROPLASTY Left 09/25/2017   REVERSE SHOULDER ARTHROPLASTY Left 09/25/2017   Procedure: LEFT REVERSE SHOULDER ARTHROPLASTY; deltoid repair;  Surgeon: Kay Kemps, MD;  Location: [facility];  Service: Orthopedics;  Laterality: Left;   ROTATOR CUFF REPAIR Left 2001    Social History   Socioeconomic History   Marital status: Divorced    Spouse name: Not on file   Number of children: 6   Years of education: Not on file   Highest education level: Not on file  Occupational History   Not on file  Tobacco Use   Smoking status: Never    Passive exposure: Never   Smokeless tobacco: Never  Vaping Use   Vaping status: Never Used  Substance and Sexual Activity   Alcohol  use: Yes    Comment: occasional   Drug use: No   Sexual activity: Not Currently  Other Topics Concern   Not on file  Social History Narrative   Not on file   Social Drivers of Health   Financial Resource Strain: Low Risk  (06/29/2023)   Overall Financial Resource Strain (CARDIA)    Difficulty of Paying Living Expenses: Not hard at all  Food Insecurity: No Food Insecurity (01/28/2024)   Hunger Vital Sign    Worried About Running Out of Food in the Last Year: Never true    Ran Out of Food in the Last Year: Never true  Transportation Needs: No Transportation Needs (01/28/2024)   PRAPARE - Administrator, Civil Service (Medical): No    Lack of Transportation (Non-Medical): No  Physical Activity: Sufficiently Active (06/29/2023)   Exercise Vital Sign    Days of Exercise per Week: 7 days    Minutes of Exercise per Session: 30 min   Stress: No Stress Concern Present (06/29/2023)   [employer] of Occupational Health - Occupational Stress Questionnaire    Feeling of Stress : Not at all  Social Connections: Socially Isolated (01/28/2024)   Social Connection and Isolation Panel    Frequency of Communication with Friends and Family: More than three times a week    Frequency of Social Gatherings with Friends and Family: Three times a week    Attends Religious Services: Never    Active Member of Clubs or Organizations: Not on file    Attends Club or Organization Meetings: Never    Marital Status: Divorced  Intimate Partner Violence: Not At Risk (01/28/2024)   Humiliation, Afraid, Rape, and Kick questionnaire    Fear of Current or Ex-Partner: No    Emotionally Abused: No    Physically Abused: No  Sexually Abused: No    Family History  Problem Relation Age of Onset   Alzheimer's disease Mother    Stroke Father    Parkinson's disease Sister    Cancer Sister    Obesity Sister    Diabetes Mellitus II Sister     Allergies  Allergen Reactions   Docosahexaenoic Acid (Dha) (Fish) Diarrhea   Lovaza [Omega-3-Acid Ethyl Esters (Fish)] Diarrhea   Atorvastatin Other (See Comments) and Nausea Only    Joint pain   Hydrocodone Rash, Itching and Other (See Comments)   Ranolazine  Diarrhea   Rosuvastatin Nausea Only and Other (See Comments)    Joint pain  Other reaction(s): Unknown  Joint pain    Joint pain  Other reaction(s): Unknown   Statins Other (See Comments)    Leg pain    Folic Acid     Note: anxiety itching and nausea   Folic Acid-Vit B6-Vit B12 Anxiety, Itching and Nausea Only    folic acid / vitamin B12 / vitamin B6   Pravastatin Nausea Only and Other (See Comments)    Outpatient Medications Prior to Visit  Medication Sig   acetaminophen  (TYLENOL ) 650 MG CR tablet Take 650 mg by mouth as needed for pain. 2 tablets in the morning and 2 tablets at bedtime   amLODipine  (NORVASC ) 10 MG tablet Take 1  tablet (10 mg total) by mouth daily.   aspirin  EC 81 MG tablet Take 81 mg by mouth daily.   azelastine  (OPTIVAR ) 0.05 % ophthalmic solution USE ONE DROP IN BOTH EYES TWICE DAILY   calcium  carbonate (TUMS - DOSED IN MG ELEMENTAL CALCIUM ) 500 MG chewable tablet Chew 2 tablets by mouth daily as needed for indigestion or heartburn.   celecoxib  (CELEBREX ) 200 MG capsule TAKE 1 CAPSULE BY MOUTH ONCE DAILY AT NOON   cholecalciferol  (VITAMIN D ) 1000 units tablet Take 1,000 Units by mouth daily.   clopidogrel  (PLAVIX ) 75 MG tablet Take 1 tablet (75 mg total) by mouth daily with breakfast.   cycloSPORINE  (RESTASIS ) 0.05 % ophthalmic emulsion Place 1 drop into both eyes 2 (two) times daily.   fenofibrate  (TRICOR ) 145 MG tablet TAKE 1 TABLET BY MOUTH ONCE EVERY EVENING   fluticasone  (FLONASE ) 50 MCG/ACT nasal spray PLACE 1-2 SPRAYS INTO BOTH NOSTRILS AS NEEDED   furosemide  (LASIX ) 20 MG tablet Take 1 tablet (20 mg total) by mouth daily.   latanoprost  (XALATAN ) 0.005 % ophthalmic solution PLACE 1 DROP INTO AFFECTED EYE ONCE EVERY EVENING FOR GLUCOMA   levocetirizine (XYZAL ) 5 MG tablet TAKE 1 TABLET BY MOUTH ONCE EVERY EVENING   lisinopril  (ZESTRIL ) 10 MG tablet TAKE 1 TABLET BY MOUTH ONCE DAILY   meclizine  (ANTIVERT ) 12.5 MG tablet Take 1 tablet (12.5 mg total) by mouth daily.   metoprolol  succinate (TOPROL  XL) 25 MG 24 hr tablet Take 0.5 tablets (12.5 mg total) by mouth daily.   nitroGLYCERIN  (NITROSTAT ) 0.4 MG SL tablet Place 1 tablet (0.4 mg total) under the tongue every 5 (five) minutes as needed for chest pain.   nystatin-triamcinolone  (MYCOLOG II) cream APPLY TO AFFECTED AREA(s) TWICE DAILY   pantoprazole  (PROTONIX ) 40 MG tablet TAKE 1 TABLET BY MOUTH TWICE DAILY   Polyethyl Glycol-Propyl Glycol (SYSTANE OP) Apply 1 drop to eye 5 (five) times daily as needed (dry eyes).   pregabalin  (LYRICA ) 75 MG capsule TAKE 1 CAPSULE BY MOUTH TWICE DAILY   ranolazine  (RANEXA ) 500 MG 12 hr tablet TAKE 1 TABLET BY  MOUTH TWICE DAILY   REPATHA  SURECLICK 140 MG/ML  SOAJ INJECT THE CONTENTS OF 1 PEN SUBCUTANEOUSLY EVERY 14 DAYS   No facility-administered medications prior to visit.    Review of Systems  Constitutional: Negative.   HENT: Negative.    Eyes: Negative.   Respiratory: Negative.  Negative for shortness of breath.   Cardiovascular: Negative.  Negative for chest pain.  Gastrointestinal: Negative.  Negative for abdominal pain, constipation and diarrhea.  Genitourinary: Negative.   Musculoskeletal:  Negative for joint pain and myalgias.  Skin: Negative.   Neurological: Negative.  Negative for dizziness and headaches.  Endo/Heme/Allergies: Negative.   All other systems reviewed and are negative.      Objective:   BP 117/72   Pulse 61   Ht 6' (1.829 m)   Wt 206 lb 6.4 oz (93.6 kg)   SpO2 97%   BMI 27.99 kg/m   Vitals:   03/31/24 1049  BP: 117/72  Pulse: 61  Height: 6' (1.829 m)  Weight: 206 lb 6.4 oz (93.6 kg)  SpO2: 97%  BMI (Calculated): 27.99    Physical Exam Nursing note reviewed.  Constitutional:      Appearance: Normal appearance. He is normal weight.  HENT:     Head: Normocephalic and atraumatic.     Nose: Nose normal.     Mouth/Throat:     Mouth: Mucous membranes are moist.     Pharynx: Oropharynx is clear.  Eyes:     Extraocular Movements: Extraocular movements intact.     Conjunctiva/sclera: Conjunctivae normal.     Pupils: Pupils are equal, round, and reactive to light.  Cardiovascular:     Rate and Rhythm: Normal rate and regular rhythm.     Pulses: Normal pulses.     Heart sounds: Normal heart sounds.  Pulmonary:     Effort: Pulmonary effort is normal.     Breath sounds: Normal breath sounds.  Abdominal:     General: Abdomen is flat. Bowel sounds are normal.     Palpations: Abdomen is soft.  Musculoskeletal:        General: Normal range of motion.     Cervical back: Normal range of motion.  Skin:    General: Skin is warm and dry.   Neurological:     General: No focal deficit present.     Mental Status: He is alert and oriented to person, place, and time.  Psychiatric:        Mood and Affect: Mood normal.        Behavior: Behavior normal.        Thought Content: Thought content normal.        Judgment: Judgment normal.      No results found for any visits on 03/31/24.  Recent Results (from the past 2160 hours)  POCT glucose (manual entry)     Status: Abnormal   Collection Time: 01/12/24 11:00 AM  Result Value Ref Range   POC Glucose 105 (A) 70 - 99 mg/dl  CBC with Differential/Platelet     Status: None   Collection Time: 01/14/24 11:05 AM  Result Value Ref Range   WBC 6.5 3.4 - 10.8 x10E3/uL   RBC 4.52 4.14 - 5.80 x10E6/uL   Hemoglobin 13.6 13.0 - 17.7 g/dL   Hematocrit 58.7 62.4 - 51.0 %   MCV 91 79 - 97 fL   MCH 30.1 26.6 - 33.0 pg   MCHC 33.0 31.5 - 35.7 g/dL   RDW 87.3 88.3 - 84.5 %   Platelets 232 150 - 450 x10E3/uL   Neutrophils  66 Not Estab. %   Lymphs 17 Not Estab. %   Monocytes 9 Not Estab. %   Eos 6 Not Estab. %   Basos 1 Not Estab. %   Neutrophils Absolute 4.3 1.4 - 7.0 x10E3/uL   Lymphocytes Absolute 1.1 0.7 - 3.1 x10E3/uL   Monocytes Absolute 0.6 0.1 - 0.9 x10E3/uL   EOS (ABSOLUTE) 0.4 0.0 - 0.4 x10E3/uL   Basophils Absolute 0.0 0.0 - 0.2 x10E3/uL   Immature Granulocytes 0 Not Estab. %   Immature Grans (Abs) 0.0 0.0 - 0.1 x10E3/uL  Comprehensive metabolic panel with GFR     Status: Abnormal   Collection Time: 01/14/24 11:05 AM  Result Value Ref Range   Glucose 114 (H) 70 - 99 mg/dL   BUN 28 (H) 8 - 27 mg/dL   Creatinine, Ser 8.55 (H) 0.76 - 1.27 mg/dL   eGFR 47 (L) >40 fO/fpw/8.26   BUN/Creatinine Ratio 19 10 - 24   Sodium 141 134 - 144 mmol/L   Potassium 4.7 3.5 - 5.2 mmol/L   Chloride 103 96 - 106 mmol/L   CO2 23 20 - 29 mmol/L   Calcium  9.6 8.6 - 10.2 mg/dL   Total Protein 6.4 6.0 - 8.5 g/dL   Albumin 4.1 3.7 - 4.7 g/dL   Globulin, Total 2.3 1.5 - 4.5 g/dL   Bilirubin  Total 0.3 0.0 - 1.2 mg/dL   Alkaline Phosphatase 52 44 - 121 IU/L   AST 11 0 - 40 IU/L   ALT 11 0 - 44 IU/L  POCT Activated clotting time     Status: None   Collection Time: 01/28/24  8:35 AM  Result Value Ref Range   Activated Clotting Time 204 seconds    Comment: Reference range 74-137 seconds for patients not on anticoagulant therapy.  POCT Activated clotting time     Status: None   Collection Time: 01/28/24  8:49 AM  Result Value Ref Range   Activated Clotting Time 245 seconds    Comment: Reference range 74-137 seconds for patients not on anticoagulant therapy.  Basic metabolic panel     Status: Abnormal   Collection Time: 01/29/24  5:05 AM  Result Value Ref Range   Sodium 139 135 - 145 mmol/L   Potassium 3.8 3.5 - 5.1 mmol/L   Chloride 109 98 - 111 mmol/L   CO2 24 22 - 32 mmol/L   Glucose, Bld 87 70 - 99 mg/dL    Comment: Glucose reference range applies only to samples taken after fasting for at least 8 hours.   BUN 24 (H) 8 - 23 mg/dL   Creatinine, Ser 8.87 0.61 - 1.24 mg/dL   Calcium  9.3 8.9 - 10.3 mg/dL   GFR, Estimated >39 >39 mL/min    Comment: (NOTE) Calculated using the CKD-EPI Creatinine Equation (2021)    Anion gap 6 5 - 15    Comment: Performed at [facility], [address] [place], [place] [address]  CBC     Status: Abnormal   Collection Time: 01/29/24  5:05 AM  Result Value Ref Range   WBC 4.3 4.0 - 10.5 K/uL   RBC 4.33 4.22 - 5.81 MIL/uL   Hemoglobin 13.1 13.0 - 17.0 g/dL   HCT 62.2 (L) 60.9 - 47.9 %   MCV 87.1 80.0 - 100.0 fL   MCH 30.3 26.0 - 34.0 pg   MCHC 34.7 30.0 - 36.0 g/dL   RDW 87.3 88.4 - 84.4 %   Platelets 177 150 - 400 K/uL  nRBC 0.0 0.0 - 0.2 %    Comment: Performed at [facility], [address] [place], [place] [address]  Lipoprotein A (LPA)     Status: None   Collection Time: 01/29/24  5:05 AM  Result Value Ref Range   Lipoprotein (a) 24.6 <75.0 nmol/L    Comment: (NOTE) This test was developed and its  performance characteristics determined by [facility]. It has not been cleared or approved by the Food and Drug Administration. Note:  Values greater than or equal to 75.0 nmol/L may       indicate an independent risk factor for CHD,       but must be evaluated with caution when applied       to non-Caucasian populations due to the       influence of genetic factors on Lp(a) across       ethnicities. Performed At: [facility] [address] [place], [place] [id number] Jennette Shorter MD Ey:[id number]   POCT glucose (manual entry)     Status: Abnormal   Collection Time: 03/08/24 11:30 AM  Result Value Ref Range   POC Glucose 111 (A) 70 - 99 mg/dl      Assessment & Plan:  Follow up at urgent care for elbow xray and possible I and D.    Problem List Items Addressed This Visit       Other   Fall at home, initial encounter - Primary    Return if symptoms worsen or fail to improve.   Total time spent: 25 minutes  [employer], NP  03/31/2024   This document may have been prepared by Dragon Voice Recognition software and as such may include unintentional dictation errors.

## 2024-04-07 ENCOUNTER — Ambulatory Visit: Payer: Self-pay | Admitting: Family Medicine

## 2024-04-07 ENCOUNTER — Ambulatory Visit (INDEPENDENT_AMBULATORY_CARE_PROVIDER_SITE_OTHER)

## 2024-04-07 ENCOUNTER — Ambulatory Visit
Admission: EM | Admit: 2024-04-07 | Discharge: 2024-04-07 | Disposition: A | Discharge status: Home / Self Care | Attending: Family Medicine | Admitting: Family Medicine

## 2024-04-07 DIAGNOSIS — W19XXXA Unspecified fall, initial encounter: Secondary | ICD-10-CM | POA: Diagnosis not present

## 2024-04-07 DIAGNOSIS — M19021 Primary osteoarthritis, right elbow: Secondary | ICD-10-CM | POA: Diagnosis not present

## 2024-04-07 DIAGNOSIS — M25521 Pain in right elbow: Secondary | ICD-10-CM

## 2024-04-07 NOTE — ED Provider Notes (Signed)
 [facility]    CSN: [id number] Arrival date & time: 04/07/24  9160      History   Chief Complaint Chief Complaint  Patient presents with   Fall   Joint Swelling    HPI  HPI [name] [name] is a 88 y.o. male.   Yovany presents for right elbow pain after a fall 5 weeks ago.  He was walking out of his home then tripped and had a tumble down the ramp.  He has to use a cane and walker now because he falls frequently.  States that he has fallen 60 times in the last 5 years.  He takes blood thinners daily.  The elbow swelling started just after the fall.  Continues to have pain especially when pressure is put on it.  He does not know if the swelling has gotten bigger or smaller since his fall.    Past Medical History:  Diagnosis Date   Arthritis    Benign neoplasm of colon 04/13/2008   Overview:  Benign Polyps Of The Large Intestine - colonoscopy 2006,dr Graig Deal of this note might be different from the original. Formatting of this note might be different from the original. Benign Polyps Of The Large Intestine - colonoscopy 2006,dr Graig Deal of this note might be different from the original. Overview: Benign Polyps Of The Large Intestine - colonoscopy 2006,dr    Blurring of visual image 05/28/2018   Cancer (HCC) 1995   prostate ca    Chest pain 07/27/2016   Chest pain, unspecified 07/27/2016   Coronary artery disease    Esophageal stricture 08/2016   GERD (gastroesophageal reflux disease)    Glaucoma    History of hiatal hernia    Hx of CABG 02/24/2018   Formatting of this note might be different from the original. Two-vessel bypass. Formatting of this note might be different from the original. Formatting of this note might be different from the original. Two-vessel bypass. Formatting of this note might be different from the original. Formatting of this note might be different from the original. Two-vessel bypass.   Hypertension    Malignant  neoplasm of prostate (HCC) 04/23/2017   MI (mitral incompetence)    MI, acute, non ST segment elevation (HCC) 10/05/2012   Formatting of this note might be different from the original. Note: Unchanged - x4   Old myocardial infarction 05/01/2019   Note: Unchanged - x4   Orthopedic aftercare 10/08/2017   Peripheral neuropathy    Peripheral vascular disease    S/P shoulder replacement, left 09/25/2017   Skin sensation disturbance 06/23/2007   Formatting of this note might be different from the original. Tingling (Paresthesia) Formatting of this note might be different from the original. Formatting of this note might be different from the original. Tingling (Paresthesia)   Tear of meniscus of knee 11/11/2013   Formatting of this note might be different from the original. Formatting of this note might be different from the original. ICD-10 cut over Formatting of this note might be different from the original. Overview: ICD-10 cut over   Vertigo     Patient Active Problem List   Diagnosis Date Noted   Fall at home, initial encounter 03/31/2024   Status post coronary artery balloon dilation 01/28/2024   Other chest pain 01/14/2024   Abnormal nuclear stress test 01/14/2024   Cyst of pancreas 09/02/2022   Acquired cyst of kidney 05/28/2021   Intertrigo 05/28/2021   Bilateral exudative age-related macular degeneration (HCC) 11/06/2020  Exocrine pancreatic insufficiency 10/28/2019   Peripheral vascular disease 10/28/2019   Absolute anemia 08/29/2019   Tinnitus 05/01/2019   Lumbosacral spondylosis with radiculopathy 02/13/2019   Entrapment neuropathy of common peroneal nerve 02/24/2018   Glaucoma 02/24/2018   Vitamin D  deficiency 02/24/2018   Vitamin B12 deficiency 02/24/2018   Osteoarthritis of left shoulder 08/04/2017   Mnire's disease 08/04/2017   SUI (stress urinary incontinence), male 06/15/2017   Gastroesophageal reflux disease without esophagitis 07/27/2016   Hyperlipidemia  07/22/2015   Osteoarthrosis, localized, primary, involving lower leg 02/22/2014   Insomnia 03/07/2013   Coronary artery disease involving coronary bypass graft of native heart with unstable angina pectoris (HCC) 05/09/2009   Essential hypertension 05/09/2009   Asymptomatic varicose veins 06/23/2007   Allergic rhinitis 12/28/2006    Past Surgical History:  Procedure Laterality Date   COLONOSCOPY     COLONOSCOPY WITH PROPOFOL  N/A 03/27/2023   Procedure: COLONOSCOPY WITH PROPOFOL ;  Surgeon: Rollin Dover, MD;  Location: [facility];  Service: Gastroenterology;  Laterality: N/A;   CORONARY ANGIOPLASTY WITH STENT PLACEMENT  2005   x9    CORONARY ARTERY BYPASS GRAFT  2011   CORONARY BALLOON ANGIOPLASTY N/A 01/28/2024   Procedure: CORONARY BALLOON ANGIOPLASTY;  Surgeon: Florencio Cara BIRCH, MD;  Location: [facility];  Service: Cardiovascular;  Laterality: N/A;   CORONARY STENT PLACEMENT  07/2014   ESOPHAGOGASTRODUODENOSCOPY     ESOPHAGOGASTRODUODENOSCOPY (EGD) WITH ESOPHAGEAL DILATION  2018   knee scope Bilateral    LEFT HEART CATH AND CORONARY ANGIOGRAPHY Left 01/28/2024   Procedure: LEFT HEART CATH AND CORONARY ANGIOGRAPHY with possible coronary intervention;  Surgeon: Fernand Denyse LABOR, MD;  Location: [facility];  Service: Cardiovascular;  Laterality: Left;   LEG SURGERY Right 09/2018   LIPOMA EXCISION N/A 09/24/2016   Procedure: EXCISION SUBCUTANEOUS LIPOMA ON UPPER BACK;  Surgeon: Donnice Lima, MD;  Location: [facility];  Service: General;  Laterality: N/A;   POLYPECTOMY  03/27/2023   Procedure: POLYPECTOMY;  Surgeon: Rollin Dover, MD;  Location: [facility];  Service: Gastroenterology;;   Prostectomy  1995   REVERSE SHOULDER ARTHROPLASTY Left 09/25/2017   REVERSE SHOULDER ARTHROPLASTY Left 09/25/2017   Procedure: LEFT REVERSE SHOULDER ARTHROPLASTY; deltoid repair;  Surgeon: Kay Kemps, MD;  Location: [facility];  Service: Orthopedics;  Laterality: Left;   ROTATOR CUFF REPAIR  Left 2001       Home Medications    Prior to Admission medications   Medication Sig Start Date End Date Taking? Authorizing Provider  acetaminophen  (TYLENOL ) 650 MG CR tablet Take 650 mg by mouth as needed for pain. 2 tablets in the morning and 2 tablets at bedtime   Yes [provider]  amLODipine  (NORVASC ) 10 MG tablet Take 1 tablet (10 mg total) by mouth daily. 07/14/23  Yes Orlean Alan HERO, FNP  aspirin  EC 81 MG tablet Take 81 mg by mouth daily.   Yes [provider]  azelastine  (OPTIVAR ) 0.05 % ophthalmic solution USE ONE DROP IN BOTH EYES TWICE DAILY 03/25/24  Yes Shirley, Amanda M, FNP  calcium  carbonate (TUMS - DOSED IN MG ELEMENTAL CALCIUM ) 500 MG chewable tablet Chew 2 tablets by mouth daily as needed for indigestion or heartburn.   Yes [provider]  celecoxib  (CELEBREX ) 200 MG capsule TAKE 1 CAPSULE BY MOUTH ONCE DAILY AT NOON 01/13/24  Yes Orlean Alan HERO, FNP  cholecalciferol  (VITAMIN D ) 1000 units tablet Take 1,000 Units by mouth daily.   Yes [provider]  clopidogrel  (PLAVIX ) 75 MG  tablet Take 1 tablet (75 mg total) by mouth daily with breakfast. 03/03/24  Yes Fernand Denyse LABOR, MD  cycloSPORINE  (RESTASIS ) 0.05 % ophthalmic emulsion Place 1 drop into both eyes 2 (two) times daily.   Yes [provider]  fenofibrate  (TRICOR ) 145 MG tablet TAKE 1 TABLET BY MOUTH ONCE EVERY EVENING 09/28/23  Yes Fernand Denyse LABOR, MD  fluticasone  (FLONASE ) 50 MCG/ACT nasal spray PLACE 1-2 SPRAYS INTO BOTH NOSTRILS AS NEEDED 02/22/24  Yes Orlean Alan HERO, FNP  furosemide  (LASIX ) 20 MG tablet Take 1 tablet (20 mg total) by mouth daily. 10/26/23 10/25/24 Yes Fernand Denyse A, MD  latanoprost  (XALATAN ) 0.005 % ophthalmic solution PLACE 1 DROP INTO AFFECTED EYE ONCE EVERY EVENING FOR GLUCOMA 03/20/23  Yes Orlean Alan HERO, FNP  levocetirizine (XYZAL ) 5 MG tablet TAKE 1 TABLET BY MOUTH ONCE EVERY EVENING 11/17/23  Yes Orlean Alan HERO, FNP  lisinopril  (ZESTRIL )  10 MG tablet TAKE 1 TABLET BY MOUTH ONCE DAILY 02/10/24  Yes Scoggins, Amber, NP  meclizine  (ANTIVERT ) 12.5 MG tablet Take 1 tablet (12.5 mg total) by mouth daily. 11/30/23  Yes Orlean Alan HERO, FNP  metoprolol  succinate (TOPROL  XL) 25 MG 24 hr tablet Take 0.5 tablets (12.5 mg total) by mouth daily. 11/13/23 11/12/24 Yes Fernand Denyse LABOR, MD  nitroGLYCERIN  (NITROSTAT ) 0.4 MG SL tablet Place 1 tablet (0.4 mg total) under the tongue every 5 (five) minutes as needed for chest pain. 07/01/23  Yes Scoggins, Hospital doctor, NP  nystatin-triamcinolone  (MYCOLOG II) cream APPLY TO AFFECTED AREA(s) TWICE DAILY 09/21/23  Yes Shirley, Amanda M, FNP  pantoprazole  (PROTONIX ) 40 MG tablet TAKE 1 TABLET BY MOUTH TWICE DAILY 03/11/24  Yes Scoggins, Amber, NP  Polyethyl Glycol-Propyl Glycol (SYSTANE OP) Apply 1 drop to eye 5 (five) times daily as needed (dry eyes).   Yes [provider]  pregabalin  (LYRICA ) 75 MG capsule TAKE 1 CAPSULE BY MOUTH TWICE DAILY 02/11/24  Yes Orlean Alan HERO, FNP  ranolazine  (RANEXA ) 500 MG 12 hr tablet TAKE 1 TABLET BY MOUTH TWICE DAILY 03/11/24  Yes Fernand Denyse LABOR, MD  REPATHA  SURECLICK 140 MG/ML SOAJ INJECT THE CONTENTS OF 1 PEN SUBCUTANEOUSLY EVERY 14 DAYS 12/14/23  Yes Fernand Denyse LABOR, MD    Family History Family History  Problem Relation Age of Onset   Alzheimer's disease Mother    Stroke Father    Parkinson's disease Sister    Cancer Sister    Obesity Sister    Diabetes Mellitus II Sister     Social History Social History   Tobacco Use   Smoking status: Never    Passive exposure: Never   Smokeless tobacco: Never  Vaping Use   Vaping status: Never Used  Substance Use Topics   Alcohol  use: Yes    Comment: occasional   Drug use: No     Allergies   Docosahexaenoic acid (dha) (fish), Lovaza [omega-3-acid ethyl esters (fish)], Atorvastatin, Hydrocodone, Ranolazine , Rosuvastatin, Statins, Folic acid, Folic acid-vit a3-cpu b12, and Pravastatin   Review of Systems Review  of Systems: :negative unless otherwise stated in HPI.      Physical Exam Triage Vital Signs ED Triage Vitals  Encounter Vitals Group     BP      Girls Systolic BP Percentile      Girls Diastolic BP Percentile      Boys Systolic BP Percentile      Boys Diastolic BP Percentile      Pulse      Resp  Temp      Temp src      SpO2      Weight      Height      Head Circumference      Peak Flow      Pain Score      Pain Loc      Pain Education      Exclude from Growth Chart    No data found.  Updated Vital Signs BP 125/67 (BP Location: Left Arm)   Pulse 61   Temp 98.6 F (37 C) (Oral)   Resp 16   Wt 93.9 kg   SpO2 94%   BMI 28.07 kg/m   Visual Acuity Right Eye Distance:   Left Eye Distance:   Bilateral Distance:    Right Eye Near:   Left Eye Near:    Bilateral Near:     Physical Exam GEN: well appearing male in no acute distress  CVS: well perfused  RESP: speaking in full sentences without pause, no respiratory distress  MSK:   Elbow, Right: Inspection yields no evidence of bony deformity, erythema, ecchymosis, or rash. Moderate fluid mass at the right olecranon with mild TTP.  Active and passive ROM intact in flexion/extension/supination/pronation. Strength 5/5 throughout. No TTP at the medial or lateral epicondyle. No pain with finger/wrist extension against resistance. No pain with gripping or finger/wrist flexion against resistance. No evidence of pain or laxity at the UCL.     UC Treatments / Results  Labs (all labs ordered are listed, but only abnormal results are displayed) Labs Reviewed - No data to display  EKG   Radiology DG Elbow Complete Right Result Date: 04/07/2024 CLINICAL DATA:  Fall 5 weeks ago with persistent right elbow pain and swelling. Patient on blood thinners. EXAM: RIGHT ELBOW - COMPLETE 3+ VIEW COMPARISON:  None Available. FINDINGS: No evidence of acute fracture or dislocation. Minimal degenerative change of the elbow joint.  Minimal chronic irregularity of the olecranon. Moderate soft tissue swelling over the extensor surface of the elbow. IMPRESSION: 1. No acute fracture or dislocation. 2. Moderate soft tissue swelling over the extensor surface of the elbow. Electronically Signed   By: Toribio Agreste M.D.   On: 04/07/2024 10:43     Procedures Procedures (including critical care time)  Medications Ordered in UC Medications - No data to display  Initial Impression / Assessment and Plan / UC Course  I have reviewed the triage vital signs and the nursing notes.  Pertinent labs & imaging results that were available during my care of the patient were reviewed by me and considered in my medical decision making (see chart for details).      Pt is a 88 y.o.  male with 5 weeks of right olecranon swelling after a fall at home. He takes blood thinners.    On exam, pt has tenderness at olecranon with fluid mass concerning for fracture vs hemorrhagic bursitis.   Obtained right elbow plain films.  Personally interpreted by me were unremarkable for fracture or dislocation. He does have some calcifications around the olecranon that do not appear but an avulsion fracture. Radiologist report reviewed and additionally notes moderate soft tissue swelling over the extensor surface of the elbow.  Recommended elbow compression.  Patient to gradually return to normal activities, as tolerated and continue ordinary activities within the limits permitted by pain.  Tylenol  PRN.  Patient to follow up with orthopedic provider as he may need the bursitis drainage.  Return and ED  precautions given. Understanding voiced. Discussed MDM, treatment plan and plan for follow-up with patient who agrees with plan.   Final Clinical Impressions(s) / UC Diagnoses   Final diagnoses:  Right elbow pain     Discharge Instructions      On my review of your xray images, you did not have any fractures or dislocated bones. You do have some calcifications  right outside tip of your elbow.  The radiologist has not yet read your xray. If it is significantly abnormal or urgent, someone will contact you.  You should see your results in MyChart.   You have a condition requiring follow up with an orthopedic specialist. Call your orthopedic doctor or consider one of the following offices to schedule an appointment:   [facility] Address: [address] [place], [place] [address] Phone: [phone]  [facility] [address] [place], [place], [place] [address] Phone: [phone]  [facility] [address] [place],  [place]  [address] Phone: [phone]  [facility] [address], [place], [place] [address] Phone: [phone]      ED Prescriptions   None    PDMP not reviewed this encounter.   Kriste Berth, DO 04/07/24 1121

## 2024-04-07 NOTE — Discharge Instructions (Signed)
 On my review of your xray images, you did not have any fractures or dislocated bones. You do have some calcifications right outside tip of your elbow.  The radiologist has not yet read your xray. If it is significantly abnormal or urgent, someone will contact you.  You should see your results in MyChart.   You have a condition requiring follow up with an orthopedic specialist. Call your orthopedic doctor or consider one of the following offices to schedule an appointment:   [facility] Address: [address], [place], [place] [address] Phone: [phone]  [facility] [address] [place] [address], [place], [place] [address] Phone: [phone]  [facility] [address] [place],  [place]  [address] Phone: [phone]  [facility] [address] [place], [place] [address] Phone: [phone]

## 2024-04-07 NOTE — ED Triage Notes (Signed)
 Patient states that he fell 5 weeks ago. Patient states that his right elbow is swollen and hurts. Patient is on blood thinners.

## 2024-04-11 ENCOUNTER — Ambulatory Visit

## 2024-04-11 DIAGNOSIS — M7021 Olecranon bursitis, right elbow: Secondary | ICD-10-CM | POA: Diagnosis not present

## 2024-04-11 DIAGNOSIS — G5620 Lesion of ulnar nerve, unspecified upper limb: Secondary | ICD-10-CM | POA: Diagnosis not present

## 2024-04-12 ENCOUNTER — Other Ambulatory Visit: Payer: Self-pay | Admitting: Family

## 2024-04-13 DIAGNOSIS — H353211 Exudative age-related macular degeneration, right eye, with active choroidal neovascularization: Secondary | ICD-10-CM | POA: Diagnosis not present

## 2024-04-13 DIAGNOSIS — H43813 Vitreous degeneration, bilateral: Secondary | ICD-10-CM | POA: Diagnosis not present

## 2024-04-13 DIAGNOSIS — H353122 Nonexudative age-related macular degeneration, left eye, intermediate dry stage: Secondary | ICD-10-CM | POA: Diagnosis not present

## 2024-04-13 DIAGNOSIS — H35033 Hypertensive retinopathy, bilateral: Secondary | ICD-10-CM | POA: Diagnosis not present

## 2024-04-13 DIAGNOSIS — H2513 Age-related nuclear cataract, bilateral: Secondary | ICD-10-CM | POA: Diagnosis not present

## 2024-04-14 ENCOUNTER — Telehealth: Payer: Self-pay | Admitting: Family

## 2024-04-14 ENCOUNTER — Other Ambulatory Visit: Payer: Self-pay

## 2024-04-14 DIAGNOSIS — R55 Syncope and collapse: Secondary | ICD-10-CM

## 2024-04-14 DIAGNOSIS — R531 Weakness: Secondary | ICD-10-CM

## 2024-04-14 NOTE — Telephone Encounter (Signed)
 Patient left VM stating his ortho dr told him he needs to see neurology because both of his arms are falling asleep at night.   Patient does not have a follow up scheduled with Alan.   Please place referral if appropriate.

## 2024-04-14 NOTE — Telephone Encounter (Signed)
 Pended  neuro referral to pcp

## 2024-04-15 ENCOUNTER — Other Ambulatory Visit: Payer: Self-pay | Admitting: Family

## 2024-04-19 LAB — GLUCOSE, POCT (MANUAL RESULT ENTRY): POC Glucose: 91 mg/dL (ref 70–99)

## 2024-04-19 NOTE — Congregational Nurse Program (Signed)
  Dept: [phone]   Congregational Nurse Program Note  Date of Encounter: 04/19/2024  Past Medical History: Past Medical History:  Diagnosis Date   Arthritis    Benign neoplasm of colon 04/13/2008   Overview:  Benign Polyps Of The Large Intestine - colonoscopy 2006,dr Graig Deal of this note might be different from the original. Formatting of this note might be different from the original. Benign Polyps Of The Large Intestine - colonoscopy 2006,dr Graig Deal of this note might be different from the original. Overview: Benign Polyps Of The Large Intestine - colonoscopy 2006,dr    Blurring of visual image 05/28/2018   Cancer (HCC) 1995   prostate ca    Chest pain 07/27/2016   Chest pain, unspecified 07/27/2016   Coronary artery disease    Esophageal stricture 08/2016   GERD (gastroesophageal reflux disease)    Glaucoma    History of hiatal hernia    Hx of CABG 02/24/2018   Formatting of this note might be different from the original. Two-vessel bypass. Formatting of this note might be different from the original. Formatting of this note might be different from the original. Two-vessel bypass. Formatting of this note might be different from the original. Formatting of this note might be different from the original. Two-vessel bypass.   Hypertension    Malignant neoplasm of prostate (HCC) 04/23/2017   MI (mitral incompetence)    MI, acute, non ST segment elevation (HCC) 10/05/2012   Formatting of this note might be different from the original. Note: Unchanged - x4   Old myocardial infarction 05/01/2019   Note: Unchanged - x4   Orthopedic aftercare 10/08/2017   Peripheral neuropathy    Peripheral vascular disease    S/P shoulder replacement, left 09/25/2017   Skin sensation disturbance 06/23/2007   Formatting of this note might be different from the original. Tingling (Paresthesia) Formatting of this note might be different from the original. Formatting of this  note might be different from the original. Tingling (Paresthesia)   Tear of meniscus of knee 11/11/2013   Formatting of this note might be different from the original. Formatting of this note might be different from the original. ICD-10 cut over Formatting of this note might be different from the original. Overview: ICD-10 cut over   Vertigo     Encounter Details:  Community Questionnaire - 04/19/24 1110       Questionnaire   Ask client: Do you give verbal consent for me to treat you today? Yes    Student Assistance N/A    Location Patient Served  [facility]    Encounter Setting CN site    Population Status Unknown    Insurance Medicare    Insurance/Financial Assistance Referral N/A    Medication N/A    Medical Provider Yes    Screening Referrals Made N/A    Medical Referrals Made N/A    Medical Appointment Completed N/A    CNP Interventions Advocate/Support    Screenings CN Performed Blood Pressure;Blood Glucose    ED Visit Averted N/A    Life-Saving Intervention Made N/A

## 2024-05-03 ENCOUNTER — Ambulatory Visit: Admitting: Cardiovascular Disease

## 2024-05-03 LAB — GLUCOSE, POCT (MANUAL RESULT ENTRY): POC Glucose: 123 mg/dL — AB (ref 70–99)

## 2024-05-03 NOTE — Congregational Nurse Program (Signed)
  Dept: [phone]   Congregational Nurse Program Note  Date of Encounter: 05/03/2024  Past Medical History: Past Medical History:  Diagnosis Date   Arthritis    Benign neoplasm of colon 04/13/2008   Overview:  Benign Polyps Of The Large Intestine - colonoscopy 2006,dr Graig Deal of this note might be different from the original. Formatting of this note might be different from the original. Benign Polyps Of The Large Intestine - colonoscopy 2006,dr Graig Deal of this note might be different from the original. Overview: Benign Polyps Of The Large Intestine - colonoscopy 2006,dr    Blurring of visual image 05/28/2018   Cancer (HCC) 1995   prostate ca    Chest pain 07/27/2016   Chest pain, unspecified 07/27/2016   Coronary artery disease    Esophageal stricture 08/2016   GERD (gastroesophageal reflux disease)    Glaucoma    History of hiatal hernia    Hx of CABG 02/24/2018   Formatting of this note might be different from the original. Two-vessel bypass. Formatting of this note might be different from the original. Formatting of this note might be different from the original. Two-vessel bypass. Formatting of this note might be different from the original. Formatting of this note might be different from the original. Two-vessel bypass.   Hypertension    Malignant neoplasm of prostate (HCC) 04/23/2017   MI (mitral incompetence)    MI, acute, non ST segment elevation (HCC) 10/05/2012   Formatting of this note might be different from the original. Note: Unchanged - x4   Old myocardial infarction 05/01/2019   Note: Unchanged - x4   Orthopedic aftercare 10/08/2017   Peripheral neuropathy    Peripheral vascular disease    S/P shoulder replacement, left 09/25/2017   Skin sensation disturbance 06/23/2007   Formatting of this note might be different from the original. Tingling (Paresthesia) Formatting of this note might be different from the original. Formatting of this  note might be different from the original. Tingling (Paresthesia)   Tear of meniscus of knee 11/11/2013   Formatting of this note might be different from the original. Formatting of this note might be different from the original. ICD-10 cut over Formatting of this note might be different from the original. Overview: ICD-10 cut over   Vertigo     Encounter Details:  Community Questionnaire - 05/03/24 1100       Questionnaire   Ask client: Do you give verbal consent for me to treat you today? Yes    Student Assistance N/A    Location Patient Served  [facility]    Encounter Setting CN site    Population Status Unknown    Insurance Medicare    Insurance/Financial Assistance Referral N/A    Medication N/A    Medical Provider Yes    Screening Referrals Made N/A    Medical Referrals Made N/A    Medical Appointment Completed N/A    CNP Interventions Advocate/Support    Screenings CN Performed Blood Pressure;Blood Glucose    ED Visit Averted N/A    Life-Saving Intervention Made N/A

## 2024-05-04 ENCOUNTER — Ambulatory Visit: Admitting: Family

## 2024-05-05 ENCOUNTER — Ambulatory Visit

## 2024-05-06 ENCOUNTER — Ambulatory Visit: Admitting: Family

## 2024-05-09 ENCOUNTER — Encounter: Payer: Self-pay | Admitting: Family

## 2024-05-09 ENCOUNTER — Ambulatory Visit (INDEPENDENT_AMBULATORY_CARE_PROVIDER_SITE_OTHER): Admitting: Family

## 2024-05-09 ENCOUNTER — Encounter: Payer: Self-pay | Admitting: Cardiovascular Disease

## 2024-05-09 ENCOUNTER — Ambulatory Visit (INDEPENDENT_AMBULATORY_CARE_PROVIDER_SITE_OTHER): Admitting: Cardiovascular Disease

## 2024-05-09 VITALS — BP 134/58 | HR 61 | Ht 69.0 in | Wt 209.0 lb

## 2024-05-09 DIAGNOSIS — R001 Bradycardia, unspecified: Secondary | ICD-10-CM | POA: Diagnosis not present

## 2024-05-09 DIAGNOSIS — I25700 Atherosclerosis of coronary artery bypass graft(s), unspecified, with unstable angina pectoris: Secondary | ICD-10-CM

## 2024-05-09 DIAGNOSIS — I10 Essential (primary) hypertension: Secondary | ICD-10-CM | POA: Diagnosis not present

## 2024-05-09 DIAGNOSIS — I739 Peripheral vascular disease, unspecified: Secondary | ICD-10-CM

## 2024-05-09 DIAGNOSIS — E782 Mixed hyperlipidemia: Secondary | ICD-10-CM

## 2024-05-09 DIAGNOSIS — E538 Deficiency of other specified B group vitamins: Secondary | ICD-10-CM | POA: Diagnosis not present

## 2024-05-09 DIAGNOSIS — R0602 Shortness of breath: Secondary | ICD-10-CM

## 2024-05-09 MED ORDER — CYANOCOBALAMIN 1000 MCG/ML IJ SOLN
1000.0000 ug | Freq: Once | INTRAMUSCULAR | Status: AC
  Administered 2024-05-09: 1000 ug via INTRAMUSCULAR

## 2024-05-09 NOTE — Progress Notes (Signed)
 Cardiology Office Note   Date:  05/09/2024   ID:  [name] [name], DOB 02/18/1936, MRN [id number]  PCP:  Orlean Alan HERO, FNP  Cardiologist:  Denyse Bathe, MD      History of Present Illness: [name] [name] is a 88 y.o. male who presents for  Chief Complaint  Patient presents with   Follow-up    2 month follow up    Both hands and arms go numb.      Past Medical History:  Diagnosis Date   Arthritis    Benign neoplasm of colon 04/13/2008   Overview:  Benign Polyps Of The Large Intestine - colonoscopy 2006,dr Graig Deal of this note might be different from the original. Formatting of this note might be different from the original. Benign Polyps Of The Large Intestine - colonoscopy 2006,dr Graig Deal of this note might be different from the original. Overview: Benign Polyps Of The Large Intestine - colonoscopy 2006,dr    Blurring of visual image 05/28/2018   Cancer (HCC) 1995   prostate ca    Chest pain 07/27/2016   Chest pain, unspecified 07/27/2016   Coronary artery disease    Esophageal stricture 08/2016   GERD (gastroesophageal reflux disease)    Glaucoma    History of hiatal hernia    Hx of CABG 02/24/2018   Formatting of this note might be different from the original. Two-vessel bypass. Formatting of this note might be different from the original. Formatting of this note might be different from the original. Two-vessel bypass. Formatting of this note might be different from the original. Formatting of this note might be different from the original. Two-vessel bypass.   Hypertension    Malignant neoplasm of prostate (HCC) 04/23/2017   MI (mitral incompetence)    MI, acute, non ST segment elevation (HCC) 10/05/2012   Formatting of this note might be different from the original. Note: Unchanged - x4   Old myocardial infarction 05/01/2019   Note: Unchanged - x4   Orthopedic aftercare 10/08/2017   Peripheral neuropathy    Peripheral vascular  disease    S/P shoulder replacement, left 09/25/2017   Skin sensation disturbance 06/23/2007   Formatting of this note might be different from the original. Tingling (Paresthesia) Formatting of this note might be different from the original. Formatting of this note might be different from the original. Tingling (Paresthesia)   Tear of meniscus of knee 11/11/2013   Formatting of this note might be different from the original. Formatting of this note might be different from the original. ICD-10 cut over Formatting of this note might be different from the original. Overview: ICD-10 cut over   Vertigo      Past Surgical History:  Procedure Laterality Date   COLONOSCOPY     COLONOSCOPY WITH PROPOFOL  N/A 03/27/2023   Procedure: COLONOSCOPY WITH PROPOFOL ;  Surgeon: Rollin Dover, MD;  Location: [facility];  Service: Gastroenterology;  Laterality: N/A;   CORONARY ANGIOPLASTY WITH STENT PLACEMENT  2005   x9    CORONARY ARTERY BYPASS GRAFT  2011   CORONARY BALLOON ANGIOPLASTY N/A 01/28/2024   Procedure: CORONARY BALLOON ANGIOPLASTY;  Surgeon: Florencio Cara BIRCH, MD;  Location: [facility];  Service: Cardiovascular;  Laterality: N/A;   CORONARY STENT PLACEMENT  07/2014   ESOPHAGOGASTRODUODENOSCOPY     ESOPHAGOGASTRODUODENOSCOPY (EGD) WITH ESOPHAGEAL DILATION  2018   knee scope Bilateral    LEFT HEART CATH AND CORONARY ANGIOGRAPHY Left 01/28/2024   Procedure: LEFT HEART CATH  AND CORONARY ANGIOGRAPHY with possible coronary intervention;  Surgeon: Fernand Denyse LABOR, MD;  Location: [facility];  Service: Cardiovascular;  Laterality: Left;   LEG SURGERY Right 09/2018   LIPOMA EXCISION N/A 09/24/2016   Procedure: EXCISION SUBCUTANEOUS LIPOMA ON UPPER BACK;  Surgeon: Donnice Lima, MD;  Location: [facility];  Service: General;  Laterality: N/A;   POLYPECTOMY  03/27/2023   Procedure: POLYPECTOMY;  Surgeon: Rollin Dover, MD;  Location: [facility];  Service: Gastroenterology;;   Prostectomy  1995    REVERSE SHOULDER ARTHROPLASTY Left 09/25/2017   REVERSE SHOULDER ARTHROPLASTY Left 09/25/2017   Procedure: LEFT REVERSE SHOULDER ARTHROPLASTY; deltoid repair;  Surgeon: Kay Kemps, MD;  Location: [facility] OR;  Service: Orthopedics;  Laterality: Left;   ROTATOR CUFF REPAIR Left 2001     Current Outpatient Medications  Medication Sig Dispense Refill   acetaminophen  (TYLENOL ) 650 MG CR tablet Take 650 mg by mouth as needed for pain. 2 tablets in the morning and 2 tablets at bedtime     amLODipine  (NORVASC ) 10 MG tablet Take 1 tablet (10 mg total) by mouth daily. 90 tablet 3   aspirin  EC 81 MG tablet Take 81 mg by mouth daily.     azelastine  (OPTIVAR ) 0.05 % ophthalmic solution USE ONE DROP IN BOTH EYES TWICE DAILY 6 mL 5   calcium  carbonate (TUMS - DOSED IN MG ELEMENTAL CALCIUM ) 500 MG chewable tablet Chew 2 tablets by mouth daily as needed for indigestion or heartburn.     celecoxib  (CELEBREX ) 200 MG capsule TAKE 1 CAPSULE BY MOUTH ONCE DAILY AT NOON 30 capsule 5   cholecalciferol  (VITAMIN D ) 1000 units tablet Take 1,000 Units by mouth daily.     clopidogrel  (PLAVIX ) 75 MG tablet Take 1 tablet (75 mg total) by mouth daily with breakfast. 90 tablet 2   cycloSPORINE  (RESTASIS ) 0.05 % ophthalmic emulsion Place 1 drop into both eyes 2 (two) times daily.     fenofibrate  (TRICOR ) 145 MG tablet TAKE 1 TABLET BY MOUTH ONCE EVERY EVENING 90 tablet 3   fluticasone  (FLONASE ) 50 MCG/ACT nasal spray PLACE 1-2 SPRAYS INTO BOTH NOSTRILS AS NEEDED 16 g 1   furosemide  (LASIX ) 20 MG tablet Take 1 tablet (20 mg total) by mouth daily. 30 tablet 11   latanoprost  (XALATAN ) 0.005 % ophthalmic solution PLACE 1 DROP INTO AFFECTED EYE ONCE EVERY EVENING FOR GLUCOMA 2.5 mL 5   levocetirizine (XYZAL ) 5 MG tablet TAKE 1 TABLET BY MOUTH ONCE EVERY EVENING 90 tablet 1   lisinopril  (ZESTRIL ) 10 MG tablet TAKE 1 TABLET BY MOUTH ONCE DAILY 30 tablet 2   meclizine  (ANTIVERT ) 12.5 MG tablet Take 1 tablet (12.5 mg total) by mouth  daily. 30 tablet 2   metoprolol  succinate (TOPROL  XL) 25 MG 24 hr tablet Take 0.5 tablets (12.5 mg total) by mouth daily. 15 tablet 11   nitroGLYCERIN  (NITROSTAT ) 0.4 MG SL tablet Place 1 tablet (0.4 mg total) under the tongue every 5 (five) minutes as needed for chest pain. 30 tablet 3   nystatin-triamcinolone  (MYCOLOG II) cream APPLY TO AFFECTED AREA(s) TWICE DAILY 60 g 5   pantoprazole  (PROTONIX ) 40 MG tablet TAKE 1 TABLET BY MOUTH TWICE DAILY 60 tablet 6   Polyethyl Glycol-Propyl Glycol (SYSTANE OP) Apply 1 drop to eye 5 (five) times daily as needed (dry eyes).     pregabalin  (LYRICA ) 75 MG capsule TAKE 1 CAPSULE BY MOUTH TWICE DAILY 60 capsule 0   ranolazine  (RANEXA ) 500 MG 12 hr tablet TAKE 1 TABLET  BY MOUTH TWICE DAILY 60 tablet 2   REPATHA  SURECLICK 140 MG/ML SOAJ INJECT THE CONTENTS OF 1 PEN SUBCUTANEOUSLY EVERY 14 DAYS 6 mL 2   No current facility-administered medications for this visit.    Allergies:   Docosahexaenoic acid (dha) (fish), Lovaza [omega-3-acid ethyl esters (fish)], Atorvastatin, Hydrocodone, Ranolazine , Rosuvastatin, Statins, Folic acid, Folic acid-vit a3-cpu b12, and Pravastatin    Social History:   reports that he has never smoked. He has never been exposed to tobacco smoke. He has never used smokeless tobacco. He reports current alcohol  use. He reports that he does not use drugs.   Family History:  family history includes Alzheimer's disease in his mother; Cancer in his sister; Diabetes Mellitus II in his sister; Obesity in his sister; Parkinson's disease in his sister; Stroke in his father.    ROS:     Review of Systems  Constitutional: Negative.   HENT: Negative.    Eyes: Negative.   Respiratory: Negative.    Gastrointestinal: Negative.   Genitourinary: Negative.   Musculoskeletal: Negative.   Skin: Negative.   Neurological: Negative.   Endo/Heme/Allergies: Negative.   Psychiatric/Behavioral: Negative.    All other systems reviewed and are negative.      All other systems are reviewed and negative.    PHYSICAL EXAM: VS:  BP (!) 134/58   Pulse 61   Ht 5' 9 (1.753 m)   Wt 209 lb (94.8 kg)   SpO2 96%   BMI 30.86 kg/m  , BMI Body mass index is 30.86 kg/m. Last weight:  Wt Readings from Last 3 Encounters:  05/09/24 209 lb (94.8 kg)  04/07/24 207 lb (93.9 kg)  03/31/24 206 lb 6.4 oz (93.6 kg)     Physical Exam Vitals reviewed.  Constitutional:      Appearance: Normal appearance. He is normal weight.  HENT:     Head: Normocephalic.     Nose: Nose normal.     Mouth/Throat:     Mouth: Mucous membranes are moist.  Eyes:     Pupils: Pupils are equal, round, and reactive to light.  Cardiovascular:     Rate and Rhythm: Normal rate and regular rhythm.     Pulses: Normal pulses.     Heart sounds: Normal heart sounds.  Pulmonary:     Effort: Pulmonary effort is normal.  Abdominal:     General: Abdomen is flat. Bowel sounds are normal.  Musculoskeletal:        General: Normal range of motion.     Cervical back: Normal range of motion.  Skin:    General: Skin is warm.  Neurological:     General: No focal deficit present.     Mental Status: He is alert.  Psychiatric:        Mood and Affect: Mood normal.       EKG:   Recent Labs: 09/28/2023: TSH 0.448 01/14/2024: ALT 11 01/29/2024: BUN 24; Creatinine, Ser 1.12; Hemoglobin 13.1; Platelets 177; Potassium 3.8; Sodium 139    Lipid Panel    Component Value Date/Time   CHOL 103 09/28/2023 1150   TRIG 113 09/28/2023 1150   HDL 49 09/28/2023 1150   CHOLHDL 2.1 09/28/2023 1150   LDLCALC 33 09/28/2023 1150   LDLDIRECT 62 02/18/2021 0808      Other studies Reviewed: Additional studies/ records that were reviewed today include:  Review of the above records demonstrates:       No data to display  ASSESSMENT AND PLAN:    ICD-10-CM   1. Essential hypertension  I10     2. SOB (shortness of breath)  R06.02     3. Mixed hyperlipidemia  E78.2      4. Sinus bradycardia  R00.1     5. Coronary artery disease involving coronary bypass graft of native heart with unstable angina pectoris (HCC)  I25.700    Stable    6. Peripheral vascular disease  I73.9        Problem List Items Addressed This Visit       Cardiovascular and Mediastinum   Coronary artery disease involving coronary bypass graft of native heart with unstable angina pectoris [facility])   Essential hypertension - Primary   Peripheral vascular disease     Other   Hyperlipidemia   Other Visit Diagnoses       SOB (shortness of breath)         Sinus bradycardia              Disposition:   Return in about 2 months (around 07/09/2024).    Total time spent: 30 minutes  Signed,  Denyse Bathe, MD  05/09/2024 10:26 AM    [facility]

## 2024-05-09 NOTE — Progress Notes (Signed)
   CHIEF COMPLAINT  B12 Shot     REASON FOR VISIT  B12 Injection     ASSESSMENT  B12 Deficiency, Unspecified     PLAN  Diagnoses and all orders for this visit:  Vitamin B12 deficiency -     cyanocobalamin  (VITAMIN B12) injection 1,000 mcg     Pt. given B12 injection in clinic.  Return for next injection per provider instructions.   Total time spent: 5 minutes  ALAN CHRISTELLA ARRANT, FNP  05/09/2024

## 2024-05-10 ENCOUNTER — Other Ambulatory Visit: Payer: Self-pay | Admitting: Family

## 2024-05-10 DIAGNOSIS — R296 Repeated falls: Secondary | ICD-10-CM | POA: Diagnosis not present

## 2024-05-10 DIAGNOSIS — R29898 Other symptoms and signs involving the musculoskeletal system: Secondary | ICD-10-CM | POA: Diagnosis not present

## 2024-05-10 DIAGNOSIS — R202 Paresthesia of skin: Secondary | ICD-10-CM | POA: Diagnosis not present

## 2024-05-10 DIAGNOSIS — Z1331 Encounter for screening for depression: Secondary | ICD-10-CM | POA: Diagnosis not present

## 2024-05-10 DIAGNOSIS — M542 Cervicalgia: Secondary | ICD-10-CM | POA: Diagnosis not present

## 2024-05-10 DIAGNOSIS — G629 Polyneuropathy, unspecified: Secondary | ICD-10-CM | POA: Diagnosis not present

## 2024-05-10 DIAGNOSIS — R2689 Other abnormalities of gait and mobility: Secondary | ICD-10-CM | POA: Diagnosis not present

## 2024-05-10 DIAGNOSIS — R200 Anesthesia of skin: Secondary | ICD-10-CM | POA: Diagnosis not present

## 2024-05-11 ENCOUNTER — Other Ambulatory Visit: Payer: Self-pay | Admitting: Physician Assistant

## 2024-05-11 DIAGNOSIS — M542 Cervicalgia: Secondary | ICD-10-CM

## 2024-05-12 DIAGNOSIS — R42 Dizziness and giddiness: Secondary | ICD-10-CM | POA: Diagnosis not present

## 2024-05-12 DIAGNOSIS — N1831 Chronic kidney disease, stage 3a: Secondary | ICD-10-CM | POA: Diagnosis not present

## 2024-05-12 DIAGNOSIS — N393 Stress incontinence (female) (male): Secondary | ICD-10-CM | POA: Diagnosis not present

## 2024-05-12 DIAGNOSIS — E785 Hyperlipidemia, unspecified: Secondary | ICD-10-CM | POA: Diagnosis not present

## 2024-05-12 DIAGNOSIS — I509 Heart failure, unspecified: Secondary | ICD-10-CM | POA: Diagnosis not present

## 2024-05-12 DIAGNOSIS — H269 Unspecified cataract: Secondary | ICD-10-CM | POA: Diagnosis not present

## 2024-05-12 DIAGNOSIS — Z79621 Long term (current) use of calcineurin inhibitor: Secondary | ICD-10-CM | POA: Diagnosis not present

## 2024-05-12 DIAGNOSIS — H409 Unspecified glaucoma: Secondary | ICD-10-CM | POA: Diagnosis not present

## 2024-05-12 DIAGNOSIS — J301 Allergic rhinitis due to pollen: Secondary | ICD-10-CM | POA: Diagnosis not present

## 2024-05-12 DIAGNOSIS — K8681 Exocrine pancreatic insufficiency: Secondary | ICD-10-CM | POA: Diagnosis not present

## 2024-05-12 DIAGNOSIS — I358 Other nonrheumatic aortic valve disorders: Secondary | ICD-10-CM | POA: Diagnosis not present

## 2024-05-12 DIAGNOSIS — I25119 Atherosclerotic heart disease of native coronary artery with unspecified angina pectoris: Secondary | ICD-10-CM | POA: Diagnosis not present

## 2024-05-12 DIAGNOSIS — H353290 Exudative age-related macular degeneration, unspecified eye, stage unspecified: Secondary | ICD-10-CM | POA: Diagnosis not present

## 2024-05-12 DIAGNOSIS — K219 Gastro-esophageal reflux disease without esophagitis: Secondary | ICD-10-CM | POA: Diagnosis not present

## 2024-05-12 DIAGNOSIS — M5430 Sciatica, unspecified side: Secondary | ICD-10-CM | POA: Diagnosis not present

## 2024-05-12 DIAGNOSIS — I130 Hypertensive heart and chronic kidney disease with heart failure and stage 1 through stage 4 chronic kidney disease, or unspecified chronic kidney disease: Secondary | ICD-10-CM | POA: Diagnosis not present

## 2024-05-12 DIAGNOSIS — Z5948 Other specified lack of adequate food: Secondary | ICD-10-CM | POA: Diagnosis not present

## 2024-05-12 DIAGNOSIS — H1045 Other chronic allergic conjunctivitis: Secondary | ICD-10-CM | POA: Diagnosis not present

## 2024-05-12 DIAGNOSIS — Z823 Family history of stroke: Secondary | ICD-10-CM | POA: Diagnosis not present

## 2024-05-12 DIAGNOSIS — Z7982 Long term (current) use of aspirin: Secondary | ICD-10-CM | POA: Diagnosis not present

## 2024-05-12 DIAGNOSIS — I70209 Unspecified atherosclerosis of native arteries of extremities, unspecified extremity: Secondary | ICD-10-CM | POA: Diagnosis not present

## 2024-05-12 DIAGNOSIS — I368 Other nonrheumatic tricuspid valve disorders: Secondary | ICD-10-CM | POA: Diagnosis not present

## 2024-05-12 DIAGNOSIS — H6062 Unspecified chronic otitis externa, left ear: Secondary | ICD-10-CM | POA: Diagnosis not present

## 2024-05-12 DIAGNOSIS — M1990 Unspecified osteoarthritis, unspecified site: Secondary | ICD-10-CM | POA: Diagnosis not present

## 2024-05-12 DIAGNOSIS — M4800 Spinal stenosis, site unspecified: Secondary | ICD-10-CM | POA: Diagnosis not present

## 2024-05-12 DIAGNOSIS — I3489 Other nonrheumatic mitral valve disorders: Secondary | ICD-10-CM | POA: Diagnosis not present

## 2024-05-12 DIAGNOSIS — R001 Bradycardia, unspecified: Secondary | ICD-10-CM | POA: Diagnosis not present

## 2024-05-16 ENCOUNTER — Telehealth: Payer: Self-pay | Admitting: Family

## 2024-05-16 NOTE — Telephone Encounter (Signed)
 Patient left VM stating he needs a referral sent to Dr. Victory DOROTHA Gens who is going to be checking him out and possibly operating on him. Please place referral.

## 2024-05-17 ENCOUNTER — Other Ambulatory Visit: Payer: Self-pay | Admitting: Family

## 2024-05-17 LAB — GLUCOSE, POCT (MANUAL RESULT ENTRY): POC Glucose: 92 mg/dL (ref 70–99)

## 2024-05-17 NOTE — Congregational Nurse Program (Signed)
  Dept: [phone]   Congregational Nurse Program Note  Date of Encounter: 05/17/2024  Past Medical History: Past Medical History:  Diagnosis Date   Arthritis    Benign neoplasm of colon 04/13/2008   Overview:  Benign Polyps Of The Large Intestine - colonoscopy 2006,dr Graig Deal of this note might be different from the original. Formatting of this note might be different from the original. Benign Polyps Of The Large Intestine - colonoscopy 2006,dr Graig Deal of this note might be different from the original. Overview: Benign Polyps Of The Large Intestine - colonoscopy 2006,dr    Blurring of visual image 05/28/2018   Cancer (HCC) 1995   prostate ca    Chest pain 07/27/2016   Chest pain, unspecified 07/27/2016   Coronary artery disease    Esophageal stricture 08/2016   GERD (gastroesophageal reflux disease)    Glaucoma    History of hiatal hernia    Hx of CABG 02/24/2018   Formatting of this note might be different from the original. Two-vessel bypass. Formatting of this note might be different from the original. Formatting of this note might be different from the original. Two-vessel bypass. Formatting of this note might be different from the original. Formatting of this note might be different from the original. Two-vessel bypass.   Hypertension    Malignant neoplasm of prostate (HCC) 04/23/2017   MI (mitral incompetence)    MI, acute, non ST segment elevation (HCC) 10/05/2012   Formatting of this note might be different from the original. Note: Unchanged - x4   Old myocardial infarction 05/01/2019   Note: Unchanged - x4   Orthopedic aftercare 10/08/2017   Peripheral neuropathy    Peripheral vascular disease    S/P shoulder replacement, left 09/25/2017   Skin sensation disturbance 06/23/2007   Formatting of this note might be different from the original. Tingling (Paresthesia) Formatting of this note might be different from the original. Formatting of this  note might be different from the original. Tingling (Paresthesia)   Tear of meniscus of knee 11/11/2013   Formatting of this note might be different from the original. Formatting of this note might be different from the original. ICD-10 cut over Formatting of this note might be different from the original. Overview: ICD-10 cut over   Vertigo     Encounter Details:  Community Questionnaire - 05/17/24 1123       Questionnaire   Ask client: Do you give verbal consent for me to treat you today? Yes    Student Assistance N/A    Location [facility] Served  [facility]    Encounter Setting CN site    Population Status Unknown    Insurance Medicare    Insurance/Financial Assistance Referral N/A    Medication N/A    Medical Provider Yes    Screening Referrals Made N/A    Medical Referrals Made N/A    Medical Appointment Completed N/A    CNP Interventions Advocate/Support    Screenings CN Performed Blood Pressure;Blood Glucose    ED Visit Averted N/A    Life-Saving Intervention Made N/A

## 2024-05-23 ENCOUNTER — Other Ambulatory Visit: Payer: Self-pay | Admitting: Family

## 2024-05-23 ENCOUNTER — Other Ambulatory Visit: Payer: Self-pay | Admitting: Cardiovascular Disease

## 2024-05-23 DIAGNOSIS — E782 Mixed hyperlipidemia: Secondary | ICD-10-CM

## 2024-05-23 DIAGNOSIS — R0789 Other chest pain: Secondary | ICD-10-CM

## 2024-05-23 DIAGNOSIS — R001 Bradycardia, unspecified: Secondary | ICD-10-CM

## 2024-05-23 DIAGNOSIS — I10 Essential (primary) hypertension: Secondary | ICD-10-CM

## 2024-05-23 DIAGNOSIS — R0602 Shortness of breath: Secondary | ICD-10-CM

## 2024-05-25 ENCOUNTER — Ambulatory Visit: Admitting: Family

## 2024-05-31 LAB — GLUCOSE, POCT (MANUAL RESULT ENTRY): POC Glucose: 104 mg/dL — AB (ref 70–99)

## 2024-05-31 NOTE — Congregational Nurse Program (Signed)
  Dept: [phone]   Congregational Nurse Program Note  Date of Encounter: 05/31/2024  Past Medical History: Past Medical History:  Diagnosis Date   Arthritis    Benign neoplasm of colon 04/13/2008   Overview:  Benign Polyps Of The Large Intestine - colonoscopy 2006,dr Graig Deal of this note might be different from the original. Formatting of this note might be different from the original. Benign Polyps Of The Large Intestine - colonoscopy 2006,dr Graig Deal of this note might be different from the original. Overview: Benign Polyps Of The Large Intestine - colonoscopy 2006,dr    Blurring of visual image 05/28/2018   Cancer (HCC) 1995   prostate ca    Chest pain 07/27/2016   Chest pain, unspecified 07/27/2016   Coronary artery disease    Esophageal stricture 08/2016   GERD (gastroesophageal reflux disease)    Glaucoma    History of hiatal hernia    Hx of CABG 02/24/2018   Formatting of this note might be different from the original. Two-vessel bypass. Formatting of this note might be different from the original. Formatting of this note might be different from the original. Two-vessel bypass. Formatting of this note might be different from the original. Formatting of this note might be different from the original. Two-vessel bypass.   Hypertension    Malignant neoplasm of prostate (HCC) 04/23/2017   MI (mitral incompetence)    MI, acute, non ST segment elevation (HCC) 10/05/2012   Formatting of this note might be different from the original. Note: Unchanged - x4   Old myocardial infarction 05/01/2019   Note: Unchanged - x4   Orthopedic aftercare 10/08/2017   Peripheral neuropathy    Peripheral vascular disease    S/P shoulder replacement, left 09/25/2017   Skin sensation disturbance 06/23/2007   Formatting of this note might be different from the original. Tingling (Paresthesia) Formatting of this note might be different from the original. Formatting of this  note might be different from the original. Tingling (Paresthesia)   Tear of meniscus of knee 11/11/2013   Formatting of this note might be different from the original. Formatting of this note might be different from the original. ICD-10 cut over Formatting of this note might be different from the original. Overview: ICD-10 cut over   Vertigo     Encounter Details:  Community Questionnaire - 05/31/24 1115       Questionnaire   Ask client: Do you give verbal consent for me to treat you today? Yes    Student Assistance N/A    Location Patient Served  [facility]    Encounter Setting CN site    Population Status Unknown    Insurance Medicare    Insurance/Financial Assistance Referral N/A    Medication N/A    Medical Provider Yes    Screening Referrals Made N/A    Medical Referrals Made N/A    Medical Appointment Completed N/A    CNP Interventions Advocate/Support    Screenings CN Performed Blood Pressure;Blood Glucose    ED Visit Averted N/A    Life-Saving Intervention Made N/A

## 2024-06-06 ENCOUNTER — Ambulatory Visit (INDEPENDENT_AMBULATORY_CARE_PROVIDER_SITE_OTHER): Admitting: Family

## 2024-06-06 ENCOUNTER — Encounter: Payer: Self-pay | Admitting: Family

## 2024-06-06 DIAGNOSIS — E538 Deficiency of other specified B group vitamins: Secondary | ICD-10-CM | POA: Diagnosis not present

## 2024-06-06 MED ORDER — CYANOCOBALAMIN 1000 MCG/ML IJ SOLN
1000.0000 ug | Freq: Once | INTRAMUSCULAR | Status: AC
  Administered 2024-06-06: 1000 ug via INTRAMUSCULAR

## 2024-06-06 NOTE — Progress Notes (Signed)
   CHIEF COMPLAINT  B12 Shot     REASON FOR VISIT  B12 Injection     ASSESSMENT  B12 Deficiency, Unspecified     PLAN  Diagnoses and all orders for this visit:  Vitamin B12 deficiency -     cyanocobalamin  (VITAMIN B12) injection 1,000 mcg     Pt. given B12 injection in clinic.  Return for next injection per provider instructions.   Total time spent: 5 minutes  ALAN CHRISTELLA ARRANT, FNP  06/06/2024

## 2024-06-13 DIAGNOSIS — H2513 Age-related nuclear cataract, bilateral: Secondary | ICD-10-CM | POA: Diagnosis not present

## 2024-06-13 DIAGNOSIS — H353122 Nonexudative age-related macular degeneration, left eye, intermediate dry stage: Secondary | ICD-10-CM | POA: Diagnosis not present

## 2024-06-13 DIAGNOSIS — H35033 Hypertensive retinopathy, bilateral: Secondary | ICD-10-CM | POA: Diagnosis not present

## 2024-06-13 DIAGNOSIS — H43813 Vitreous degeneration, bilateral: Secondary | ICD-10-CM | POA: Diagnosis not present

## 2024-06-13 DIAGNOSIS — H353211 Exudative age-related macular degeneration, right eye, with active choroidal neovascularization: Secondary | ICD-10-CM | POA: Diagnosis not present

## 2024-06-14 LAB — GLUCOSE, POCT (MANUAL RESULT ENTRY): POC Glucose: 115 mg/dL — AB (ref 70–99)

## 2024-06-14 NOTE — Congregational Nurse Program (Signed)
  Dept: [phone]   Congregational Nurse Program Note  Date of Encounter: 06/14/2024  Past Medical History: Past Medical History:  Diagnosis Date   Arthritis    Benign neoplasm of colon 04/13/2008   Overview:  Benign Polyps Of The Large Intestine - colonoscopy 2006,dr Graig Deal of this note might be different from the original. Formatting of this note might be different from the original. Benign Polyps Of The Large Intestine - colonoscopy 2006,dr Graig Deal of this note might be different from the original. Overview: Benign Polyps Of The Large Intestine - colonoscopy 2006,dr    Blurring of visual image 05/28/2018   Cancer (HCC) 1995   prostate ca    Chest pain 07/27/2016   Chest pain, unspecified 07/27/2016   Coronary artery disease    Esophageal stricture 08/2016   GERD (gastroesophageal reflux disease)    Glaucoma    History of hiatal hernia    Hx of CABG 02/24/2018   Formatting of this note might be different from the original. Two-vessel bypass. Formatting of this note might be different from the original. Formatting of this note might be different from the original. Two-vessel bypass. Formatting of this note might be different from the original. Formatting of this note might be different from the original. Two-vessel bypass.   Hypertension    Malignant neoplasm of prostate (HCC) 04/23/2017   MI (mitral incompetence)    MI, acute, non ST segment elevation (HCC) 10/05/2012   Formatting of this note might be different from the original. Note: Unchanged - x4   Old myocardial infarction 05/01/2019   Note: Unchanged - x4   Orthopedic aftercare 10/08/2017   Peripheral neuropathy    Peripheral vascular disease    S/P shoulder replacement, left 09/25/2017   Skin sensation disturbance 06/23/2007   Formatting of this note might be different from the original. Tingling (Paresthesia) Formatting of this note might be different from the original. Formatting of this  note might be different from the original. Tingling (Paresthesia)   Tear of meniscus of knee 11/11/2013   Formatting of this note might be different from the original. Formatting of this note might be different from the original. ICD-10 cut over Formatting of this note might be different from the original. Overview: ICD-10 cut over   Vertigo     Encounter Details:  Community Questionnaire - 06/14/24 1107       Questionnaire   Ask client: Do you give verbal consent for me to treat you today? Yes    Student Assistance N/A    Location Patient Served  [facility]    Encounter Setting CN site    Population Status Unknown    Insurance Medicare    Insurance/Financial Assistance Referral N/A    Medication N/A    Medical Provider Yes    Screening Referrals Made N/A    Medical Referrals Made N/A    Medical Appointment Completed N/A    CNP Interventions Advocate/Support    Screenings CN Performed Blood Pressure;Blood Glucose    ED Visit Averted N/A    Life-Saving Intervention Made N/A

## 2024-06-15 DIAGNOSIS — H353230 Exudative age-related macular degeneration, bilateral, stage unspecified: Secondary | ICD-10-CM | POA: Diagnosis not present

## 2024-06-15 DIAGNOSIS — R296 Repeated falls: Secondary | ICD-10-CM | POA: Diagnosis not present

## 2024-06-15 DIAGNOSIS — R202 Paresthesia of skin: Secondary | ICD-10-CM | POA: Diagnosis not present

## 2024-06-15 DIAGNOSIS — R29898 Other symptoms and signs involving the musculoskeletal system: Secondary | ICD-10-CM | POA: Diagnosis not present

## 2024-06-15 DIAGNOSIS — M542 Cervicalgia: Secondary | ICD-10-CM | POA: Diagnosis not present

## 2024-06-15 DIAGNOSIS — R2689 Other abnormalities of gait and mobility: Secondary | ICD-10-CM | POA: Diagnosis not present

## 2024-06-15 DIAGNOSIS — R200 Anesthesia of skin: Secondary | ICD-10-CM | POA: Diagnosis not present

## 2024-06-15 DIAGNOSIS — G5603 Carpal tunnel syndrome, bilateral upper limbs: Secondary | ICD-10-CM | POA: Diagnosis not present

## 2024-06-22 ENCOUNTER — Other Ambulatory Visit: Payer: Self-pay | Admitting: Cardiovascular Disease

## 2024-06-22 DIAGNOSIS — I25700 Atherosclerosis of coronary artery bypass graft(s), unspecified, with unstable angina pectoris: Secondary | ICD-10-CM

## 2024-06-28 LAB — GLUCOSE, POCT (MANUAL RESULT ENTRY): POC Glucose: 124 mg/dL — AB (ref 70–99)

## 2024-06-28 NOTE — Congregational Nurse Program (Signed)
°  Dept: [phone]   Congregational Nurse Program Note  Date of Encounter: 06/28/2024  Past Medical History: Past Medical History:  Diagnosis Date   Arthritis    Benign neoplasm of colon 04/13/2008   Overview:  Benign Polyps Of The Large Intestine - colonoscopy 2006,dr Graig Deal of this note might be different from the original. Formatting of this note might be different from the original. Benign Polyps Of The Large Intestine - colonoscopy 2006,dr Graig Deal of this note might be different from the original. Overview: Benign Polyps Of The Large Intestine - colonoscopy 2006,dr    Blurring of visual image 05/28/2018   Cancer (HCC) 1995   prostate ca    Chest pain 07/27/2016   Chest pain, unspecified 07/27/2016   Coronary artery disease    Esophageal stricture 08/2016   GERD (gastroesophageal reflux disease)    Glaucoma    History of hiatal hernia    Hx of CABG 02/24/2018   Formatting of this note might be different from the original. Two-vessel bypass. Formatting of this note might be different from the original. Formatting of this note might be different from the original. Two-vessel bypass. Formatting of this note might be different from the original. Formatting of this note might be different from the original. Two-vessel bypass.   Hypertension    Malignant neoplasm of prostate (HCC) 04/23/2017   MI (mitral incompetence)    MI, acute, non ST segment elevation (HCC) 10/05/2012   Formatting of this note might be different from the original. Note: Unchanged - x4   Old myocardial infarction 05/01/2019   Note: Unchanged - x4   Orthopedic aftercare 10/08/2017   Peripheral neuropathy    Peripheral vascular disease    S/P shoulder replacement, left 09/25/2017   Skin sensation disturbance 06/23/2007   Formatting of this note might be different from the original. Tingling (Paresthesia) Formatting of this note might be different from the original. Formatting of this  note might be different from the original. Tingling (Paresthesia)   Tear of meniscus of knee 11/11/2013   Formatting of this note might be different from the original. Formatting of this note might be different from the original. ICD-10 cut over Formatting of this note might be different from the original. Overview: ICD-10 cut over   Vertigo     Encounter Details:  Community Questionnaire - 06/28/24 1030       Questionnaire   Ask client: Do you give verbal consent for me to treat you today? Yes    Student Assistance N/A    Location Patient Served  [facility]    Encounter Setting CN site    Population Status Unknown    Insurance Medicare    Insurance/Financial Assistance Referral N/A    Medication N/A    Medical Provider Yes    Screening Referrals Made N/A    Medical Referrals Made N/A    Medical Appointment Completed N/A    CNP Interventions Advocate/Support    Screenings CN Performed Blood Pressure;Blood Glucose    ED Visit Averted N/A    Life-Saving Intervention Made N/A

## 2024-07-05 ENCOUNTER — Ambulatory Visit

## 2024-07-11 ENCOUNTER — Ambulatory Visit: Admitting: Family

## 2024-07-11 ENCOUNTER — Encounter: Payer: Self-pay | Admitting: Cardiovascular Disease

## 2024-07-11 ENCOUNTER — Ambulatory Visit: Admitting: Cardiovascular Disease

## 2024-07-11 VITALS — BP 118/54 | HR 63 | Ht 69.0 in | Wt 210.2 lb

## 2024-07-11 DIAGNOSIS — I739 Peripheral vascular disease, unspecified: Secondary | ICD-10-CM

## 2024-07-11 DIAGNOSIS — Z955 Presence of coronary angioplasty implant and graft: Secondary | ICD-10-CM | POA: Diagnosis not present

## 2024-07-11 DIAGNOSIS — I10 Essential (primary) hypertension: Secondary | ICD-10-CM | POA: Diagnosis not present

## 2024-07-11 DIAGNOSIS — E782 Mixed hyperlipidemia: Secondary | ICD-10-CM

## 2024-07-11 DIAGNOSIS — R001 Bradycardia, unspecified: Secondary | ICD-10-CM | POA: Diagnosis not present

## 2024-07-11 DIAGNOSIS — I25728 Atherosclerosis of autologous artery coronary artery bypass graft(s) with other forms of angina pectoris: Secondary | ICD-10-CM

## 2024-07-11 DIAGNOSIS — E538 Deficiency of other specified B group vitamins: Secondary | ICD-10-CM

## 2024-07-11 DIAGNOSIS — R55 Syncope and collapse: Secondary | ICD-10-CM

## 2024-07-11 DIAGNOSIS — I25700 Atherosclerosis of coronary artery bypass graft(s), unspecified, with unstable angina pectoris: Secondary | ICD-10-CM | POA: Diagnosis not present

## 2024-07-11 DIAGNOSIS — R0602 Shortness of breath: Secondary | ICD-10-CM

## 2024-07-11 MED ORDER — CYANOCOBALAMIN 1000 MCG/ML IJ SOLN
1000.0000 ug | Freq: Once | INTRAMUSCULAR | Status: AC
  Administered 2024-07-11: 1000 ug via INTRAMUSCULAR

## 2024-07-11 NOTE — Progress Notes (Signed)
" ° °  CHIEF COMPLAINT  B12 Shot     REASON FOR VISIT  B12 Injection     ASSESSMENT  B12 Deficiency, Unspecified     PLAN  Diagnoses and all orders for this visit:  Vitamin B12 deficiency -     cyanocobalamin  (VITAMIN B12) injection 1,000 mcg     Pt. given B12 injection in clinic.  Return for next injection per provider instructions.   Total time spent: 5 minutes  ALAN CHRISTELLA ARRANT, FNP  07/11/2024 "

## 2024-07-11 NOTE — Progress Notes (Signed)
 "     Cardiology Office Note   Date:  07/11/2024   ID:  [name] [name], DOB May 17, 1936, MRN [id number]  PCP:  Orlean Alan HERO, FNP  Cardiologist:  Denyse Bathe, MD      History of Present Illness: [name] [name] is a 88 y.o. male who presents for  Chief Complaint  Patient presents with   Follow-up    2 month follow up    Gets SOB in tying shoes., Keeps falling.      Past Medical History:  Diagnosis Date   Arthritis    Benign neoplasm of colon 04/13/2008   Overview:  Benign Polyps Of The Large Intestine - colonoscopy 2006,dr Graig Deal of this note might be different from the original. Formatting of this note might be different from the original. Benign Polyps Of The Large Intestine - colonoscopy 2006,dr Graig Deal of this note might be different from the original. Overview: Benign Polyps Of The Large Intestine - colonoscopy 2006,dr    Blurring of visual image 05/28/2018   Cancer (HCC) 1995   prostate ca    Chest pain 07/27/2016   Chest pain, unspecified 07/27/2016   Coronary artery disease    Esophageal stricture 08/2016   GERD (gastroesophageal reflux disease)    Glaucoma    History of hiatal hernia    Hx of CABG 02/24/2018   Formatting of this note might be different from the original. Two-vessel bypass. Formatting of this note might be different from the original. Formatting of this note might be different from the original. Two-vessel bypass. Formatting of this note might be different from the original. Formatting of this note might be different from the original. Two-vessel bypass.   Hypertension    Malignant neoplasm of prostate (HCC) 04/23/2017   MI (mitral incompetence)    MI, acute, non ST segment elevation (HCC) 10/05/2012   Formatting of this note might be different from the original. Note: Unchanged - x4   Old myocardial infarction 05/01/2019   Note: Unchanged - x4   Orthopedic aftercare 10/08/2017   Peripheral neuropathy    Peripheral  vascular disease    S/P shoulder replacement, left 09/25/2017   Skin sensation disturbance 06/23/2007   Formatting of this note might be different from the original. Tingling (Paresthesia) Formatting of this note might be different from the original. Formatting of this note might be different from the original. Tingling (Paresthesia)   Tear of meniscus of knee 11/11/2013   Formatting of this note might be different from the original. Formatting of this note might be different from the original. ICD-10 cut over Formatting of this note might be different from the original. Overview: ICD-10 cut over   Vertigo      Past Surgical History:  Procedure Laterality Date   COLONOSCOPY     COLONOSCOPY WITH PROPOFOL  N/A 03/27/2023   Procedure: COLONOSCOPY WITH PROPOFOL ;  Surgeon: Rollin Dover, MD;  Location: [facility];  Service: Gastroenterology;  Laterality: N/A;   CORONARY ANGIOPLASTY WITH STENT PLACEMENT  2005   x9    CORONARY ARTERY BYPASS GRAFT  2011   CORONARY BALLOON ANGIOPLASTY N/A 01/28/2024   Procedure: CORONARY BALLOON ANGIOPLASTY;  Surgeon: Florencio Cara BIRCH, MD;  Location: [facility];  Service: Cardiovascular;  Laterality: N/A;   CORONARY STENT PLACEMENT  07/2014   ESOPHAGOGASTRODUODENOSCOPY     ESOPHAGOGASTRODUODENOSCOPY (EGD) WITH ESOPHAGEAL DILATION  2018   knee scope Bilateral    LEFT HEART CATH AND CORONARY ANGIOGRAPHY Left 01/28/2024  Procedure: LEFT HEART CATH AND CORONARY ANGIOGRAPHY with possible coronary intervention;  Surgeon: Fernand Denyse LABOR, MD;  Location: [facility];  Service: Cardiovascular;  Laterality: Left;   LEG SURGERY Right 09/2018   LIPOMA EXCISION N/A 09/24/2016   Procedure: EXCISION SUBCUTANEOUS LIPOMA ON UPPER BACK;  Surgeon: Donnice Lima, MD;  Location: [facility];  Service: General;  Laterality: N/A;   POLYPECTOMY  03/27/2023   Procedure: POLYPECTOMY;  Surgeon: Rollin Dover, MD;  Location: [facility];  Service: Gastroenterology;;    Prostectomy  1995   REVERSE SHOULDER ARTHROPLASTY Left 09/25/2017   REVERSE SHOULDER ARTHROPLASTY Left 09/25/2017   Procedure: LEFT REVERSE SHOULDER ARTHROPLASTY; deltoid repair;  Surgeon: Kay Kemps, MD;  Location: [facility];  Service: Orthopedics;  Laterality: Left;   ROTATOR CUFF REPAIR Left 2001     Current Outpatient Medications  Medication Sig Dispense Refill   acetaminophen  (TYLENOL ) 650 MG CR tablet Take 650 mg by mouth as needed for pain. 2 tablets in the morning and 2 tablets at bedtime     aspirin  EC 81 MG tablet Take 81 mg by mouth daily.     azelastine  (OPTIVAR ) 0.05 % ophthalmic solution USE ONE DROP IN BOTH EYES TWICE DAILY 6 mL 5   calcium  carbonate (TUMS - DOSED IN MG ELEMENTAL CALCIUM ) 500 MG chewable tablet Chew 2 tablets by mouth daily as needed for indigestion or heartburn.     celecoxib  (CELEBREX ) 200 MG capsule TAKE 1 CAPSULE BY MOUTH ONCE DAILY AT NOON 30 capsule 5   cholecalciferol  (VITAMIN D ) 1000 units tablet Take 1,000 Units by mouth daily.     clopidogrel  (PLAVIX ) 75 MG tablet Take 1 tablet (75 mg total) by mouth daily with breakfast. 90 tablet 2   cycloSPORINE  (RESTASIS ) 0.05 % ophthalmic emulsion Place 1 drop into both eyes 2 (two) times daily.     fenofibrate  (TRICOR ) 145 MG tablet TAKE 1 TABLET BY MOUTH ONCE EVERY EVENING 90 tablet 3   fluticasone  (FLONASE ) 50 MCG/ACT nasal spray PLACE 1-2 SPRAYS INTO BOTH NOSTRILS AS NEEDED 16 g 1   furosemide  (LASIX ) 20 MG tablet Take 1 tablet (20 mg total) by mouth daily. 30 tablet 11   latanoprost  (XALATAN ) 0.005 % ophthalmic solution PLACE 1 DROP INTO AFFECTED EYE ONCE EVERY EVENING FOR GLUCOMA 2.5 mL 5   levocetirizine (XYZAL ) 5 MG tablet TAKE 1 TABLET BY MOUTH ONCE EVERY EVENING 90 tablet 1   lisinopril  (ZESTRIL ) 10 MG tablet TAKE 1 TABLET BY MOUTH ONCE DAILY 30 tablet 2   meclizine  (ANTIVERT ) 12.5 MG tablet Take 1 tablet (12.5 mg total) by mouth daily. 30 tablet 2   metoprolol  succinate (TOPROL  XL) 25 MG 24 hr tablet  Take 0.5 tablets (12.5 mg total) by mouth daily. 15 tablet 11   nitroGLYCERIN  (NITROSTAT ) 0.4 MG SL tablet Place 1 tablet (0.4 mg total) under the tongue every 5 (five) minutes as needed for chest pain. 30 tablet 3   nystatin-triamcinolone  (MYCOLOG II) cream APPLY TO AFFECTED AREA(s) TWICE DAILY 60 g 5   pantoprazole  (PROTONIX ) 40 MG tablet TAKE 1 TABLET BY MOUTH TWICE DAILY 60 tablet 6   Polyethyl Glycol-Propyl Glycol (SYSTANE OP) Apply 1 drop to eye 5 (five) times daily as needed (dry eyes).     pregabalin  (LYRICA ) 75 MG capsule TAKE 1 CAPSULE BY MOUTH TWICE DAILY 60 capsule 2   ranolazine  (RANEXA ) 500 MG 12 hr tablet TAKE 1 TABLET BY MOUTH TWICE DAILY 60 tablet 2   REPATHA  SURECLICK 140 MG/ML SOAJ INJECT  THE CONTENTS OF 1 PEN SUBCUTANEOUSLY EVERY 14 DAYS 6 mL 2   No current facility-administered medications for this visit.    Allergies:   Docosahexaenoic acid (dha) (fish), Lovaza [omega-3-acid ethyl esters (fish)], Atorvastatin, Hydrocodone, Ranolazine , Rosuvastatin, Statins, Folic acid, Folic acid-vit a3-cpu b12, and Pravastatin    Social History:   reports that he has never smoked. He has never been exposed to tobacco smoke. He has never used smokeless tobacco. He reports current alcohol  use. He reports that he does not use drugs.   Family History:  family history includes Alzheimer's disease in his mother; Cancer in his sister; Diabetes Mellitus II in his sister; Obesity in his sister; Parkinson's disease in his sister; Stroke in his father.    ROS:     Review of Systems  Constitutional: Negative.   HENT: Negative.    Eyes: Negative.   Respiratory: Negative.    Gastrointestinal: Negative.   Genitourinary: Negative.   Musculoskeletal: Negative.   Skin: Negative.   Neurological: Negative.   Endo/Heme/Allergies: Negative.   Psychiatric/Behavioral: Negative.    All other systems reviewed and are negative.     All other systems are reviewed and negative.    PHYSICAL  EXAM: VS:  BP (!) 118/54   Pulse 63   Ht 5' 9 (1.753 m)   Wt 210 lb 3.2 oz (95.3 kg)   SpO2 97%   BMI 31.04 kg/m  , BMI Body mass index is 31.04 kg/m. Last weight:  Wt Readings from Last 3 Encounters:  07/11/24 210 lb 3.2 oz (95.3 kg)  05/09/24 209 lb (94.8 kg)  04/07/24 207 lb (93.9 kg)     Physical Exam Vitals reviewed.  Constitutional:      Appearance: Normal appearance. He is normal weight.  HENT:     Head: Normocephalic.     Nose: Nose normal.     Mouth/Throat:     Mouth: Mucous membranes are moist.  Eyes:     Pupils: Pupils are equal, round, and reactive to light.  Cardiovascular:     Rate and Rhythm: Normal rate and regular rhythm.     Pulses: Normal pulses.     Heart sounds: Normal heart sounds.  Pulmonary:     Effort: Pulmonary effort is normal.  Abdominal:     General: Abdomen is flat. Bowel sounds are normal.  Musculoskeletal:        General: Normal range of motion.     Cervical back: Normal range of motion.  Skin:    General: Skin is warm.  Neurological:     General: No focal deficit present.     Mental Status: He is alert.  Psychiatric:        Mood and Affect: Mood normal.       EKG:   Recent Labs: 09/28/2023: TSH 0.448 01/14/2024: ALT 11 01/29/2024: BUN 24; Creatinine, Ser 1.12; Hemoglobin 13.1; Platelets 177; Potassium 3.8; Sodium 139    Lipid Panel    Component Value Date/Time   CHOL 103 09/28/2023 1150   TRIG 113 09/28/2023 1150   HDL 49 09/28/2023 1150   CHOLHDL 2.1 09/28/2023 1150   LDLCALC 33 09/28/2023 1150   LDLDIRECT 62 02/18/2021 0808      Other studies Reviewed: Additional studies/ records that were reviewed today include:  Review of the above records demonstrates:       No data to display            ASSESSMENT AND PLAN:    ICD-10-CM   1.  Syncope, unspecified syncope type  R55     2. Peripheral vascular disease  I73.9     3. SOB (shortness of breath)  R06.02     4. Essential hypertension  I10    Stop  amlodapine as BP is low and keeps falling.    5. Mixed hyperlipidemia  E78.2     6. Sinus bradycardia  R00.1     7. Coronary artery disease involving coronary bypass graft of native heart with unstable angina pectoris (HCC)  I25.700     8. Status post coronary artery balloon dilation  Z95.5     9. Coronary artery disease of autologous bypass graft with stable angina pectoris  I25.728        Problem List Items Addressed This Visit       Cardiovascular and Mediastinum   Coronary artery disease involving coronary bypass graft of native heart with unstable angina pectoris [facility])   Essential hypertension   Peripheral vascular disease     Other   Hyperlipidemia   Status post coronary artery balloon dilation   Other Visit Diagnoses       Syncope, unspecified syncope type    -  Primary     SOB (shortness of breath)         Sinus bradycardia         Coronary artery disease of autologous bypass graft with stable angina pectoris              Disposition:   Return in about 4 weeks (around 08/08/2024).    Total time spent: 35 minutes  Signed,  Denyse Bathe, MD  07/11/2024 11:35 AM    [facility] "

## 2024-07-12 ENCOUNTER — Other Ambulatory Visit: Payer: Self-pay | Admitting: Cardiovascular Disease

## 2024-07-13 ENCOUNTER — Other Ambulatory Visit: Payer: Self-pay | Admitting: Family

## 2024-07-18 ENCOUNTER — Telehealth: Payer: Self-pay

## 2024-07-18 ENCOUNTER — Encounter: Payer: Self-pay | Admitting: Cardiovascular Disease

## 2024-07-18 ENCOUNTER — Ambulatory Visit: Admitting: Cardiovascular Disease

## 2024-07-18 VITALS — BP 116/58 | HR 57 | Ht 69.0 in | Wt 208.2 lb

## 2024-07-18 DIAGNOSIS — R0602 Shortness of breath: Secondary | ICD-10-CM | POA: Diagnosis not present

## 2024-07-18 DIAGNOSIS — E782 Mixed hyperlipidemia: Secondary | ICD-10-CM | POA: Diagnosis not present

## 2024-07-18 DIAGNOSIS — I25700 Atherosclerosis of coronary artery bypass graft(s), unspecified, with unstable angina pectoris: Secondary | ICD-10-CM | POA: Diagnosis not present

## 2024-07-18 DIAGNOSIS — R001 Bradycardia, unspecified: Secondary | ICD-10-CM

## 2024-07-18 DIAGNOSIS — R0789 Other chest pain: Secondary | ICD-10-CM | POA: Diagnosis not present

## 2024-07-18 DIAGNOSIS — I10 Essential (primary) hypertension: Secondary | ICD-10-CM | POA: Diagnosis not present

## 2024-07-18 DIAGNOSIS — Z955 Presence of coronary angioplasty implant and graft: Secondary | ICD-10-CM

## 2024-07-18 DIAGNOSIS — I25728 Atherosclerosis of autologous artery coronary artery bypass graft(s) with other forms of angina pectoris: Secondary | ICD-10-CM

## 2024-07-18 MED ORDER — RANOLAZINE ER 1000 MG PO TB12: 1000.0000 mg | ORAL_TABLET | Freq: Two times a day (BID) | ORAL | 2 refills | Status: DC

## 2024-07-18 MED ORDER — NITROGLYCERIN 0.4 MG SL SUBL: 0.4000 mg | SUBLINGUAL_TABLET | SUBLINGUAL | 3 refills | Status: AC | PRN

## 2024-07-18 NOTE — Progress Notes (Signed)
 "     Cardiology Office Note   Date:  07/18/2024   ID:  [name] [name], DOB January 27, 1936, MRN [id number]  PCP:  Orlean Alan HERO, FNP  Cardiologist:  Denyse Bathe, MD      History of Present Illness: [name] [name] is a 89 y.o. male who presents for  Chief Complaint  Patient presents with   Acute Visit    Request by SK    Had Friday night chest pain and got better now.      Past Medical History:  Diagnosis Date   Arthritis    Benign neoplasm of colon 04/13/2008   Overview:  Benign Polyps Of The Large Intestine - colonoscopy 2006,dr Graig Deal of this note might be different from the original. Formatting of this note might be different from the original. Benign Polyps Of The Large Intestine - colonoscopy 2006,dr Graig Deal of this note might be different from the original. Overview: Benign Polyps Of The Large Intestine - colonoscopy 2006,dr    Blurring of visual image 05/28/2018   Cancer (HCC) 1995   prostate ca    Chest pain 07/27/2016   Chest pain, unspecified 07/27/2016   Coronary artery disease    Esophageal stricture 08/2016   GERD (gastroesophageal reflux disease)    Glaucoma    History of hiatal hernia    Hx of CABG 02/24/2018   Formatting of this note might be different from the original. Two-vessel bypass. Formatting of this note might be different from the original. Formatting of this note might be different from the original. Two-vessel bypass. Formatting of this note might be different from the original. Formatting of this note might be different from the original. Two-vessel bypass.   Hypertension    Malignant neoplasm of prostate (HCC) 04/23/2017   MI (mitral incompetence)    MI, acute, non ST segment elevation (HCC) 10/05/2012   Formatting of this note might be different from the original. Note: Unchanged - x4   Old myocardial infarction 05/01/2019   Note: Unchanged - x4   Orthopedic aftercare 10/08/2017   Peripheral neuropathy     Peripheral vascular disease    S/P shoulder replacement, left 09/25/2017   Skin sensation disturbance 06/23/2007   Formatting of this note might be different from the original. Tingling (Paresthesia) Formatting of this note might be different from the original. Formatting of this note might be different from the original. Tingling (Paresthesia)   Tear of meniscus of knee 11/11/2013   Formatting of this note might be different from the original. Formatting of this note might be different from the original. ICD-10 cut over Formatting of this note might be different from the original. Overview: ICD-10 cut over   Vertigo      Past Surgical History:  Procedure Laterality Date   COLONOSCOPY     COLONOSCOPY WITH PROPOFOL  N/A 03/27/2023   Procedure: COLONOSCOPY WITH PROPOFOL ;  Surgeon: Rollin Dover, MD;  Location: [facility];  Service: Gastroenterology;  Laterality: N/A;   CORONARY ANGIOPLASTY WITH STENT PLACEMENT  2005   x9    CORONARY ARTERY BYPASS GRAFT  2011   CORONARY BALLOON ANGIOPLASTY N/A 01/28/2024   Procedure: CORONARY BALLOON ANGIOPLASTY;  Surgeon: Florencio Cara BIRCH, MD;  Location: [facility];  Service: Cardiovascular;  Laterality: N/A;   CORONARY STENT PLACEMENT  07/2014   ESOPHAGOGASTRODUODENOSCOPY     ESOPHAGOGASTRODUODENOSCOPY (EGD) WITH ESOPHAGEAL DILATION  2018   knee scope Bilateral    LEFT HEART CATH AND CORONARY ANGIOGRAPHY Left 01/28/2024  Procedure: LEFT HEART CATH AND CORONARY ANGIOGRAPHY with possible coronary intervention;  Surgeon: Fernand Denyse LABOR, MD;  Location: [facility];  Service: Cardiovascular;  Laterality: Left;   LEG SURGERY Right 09/2018   LIPOMA EXCISION N/A 09/24/2016   Procedure: EXCISION SUBCUTANEOUS LIPOMA ON UPPER BACK;  Surgeon: Donnice Lima, MD;  Location: [facility];  Service: General;  Laterality: N/A;   POLYPECTOMY  03/27/2023   Procedure: POLYPECTOMY;  Surgeon: Rollin Dover, MD;  Location: [facility];  Service: Gastroenterology;;    Prostectomy  1995   REVERSE SHOULDER ARTHROPLASTY Left 09/25/2017   REVERSE SHOULDER ARTHROPLASTY Left 09/25/2017   Procedure: LEFT REVERSE SHOULDER ARTHROPLASTY; deltoid repair;  Surgeon: Kay Kemps, MD;  Location: [facility];  Service: Orthopedics;  Laterality: Left;   ROTATOR CUFF REPAIR Left 2001     Current Outpatient Medications  Medication Sig Dispense Refill   acetaminophen  (TYLENOL ) 650 MG CR tablet Take 650 mg by mouth as needed for pain. 2 tablets in the morning and 2 tablets at bedtime     aspirin  EC 81 MG tablet Take 81 mg by mouth daily.     azelastine  (OPTIVAR ) 0.05 % ophthalmic solution USE ONE DROP IN BOTH EYES TWICE DAILY 6 mL 5   calcium  carbonate (TUMS - DOSED IN MG ELEMENTAL CALCIUM ) 500 MG chewable tablet Chew 2 tablets by mouth daily as needed for indigestion or heartburn.     celecoxib  (CELEBREX ) 200 MG capsule TAKE 1 CAPSULE BY MOUTH ONCE DAILY AT NOON 30 capsule 5   cholecalciferol  (VITAMIN D ) 1000 units tablet Take 1,000 Units by mouth daily.     clopidogrel  (PLAVIX ) 75 MG tablet Take 1 tablet (75 mg total) by mouth daily with breakfast. 90 tablet 2   cycloSPORINE  (RESTASIS ) 0.05 % ophthalmic emulsion Place 1 drop into both eyes 2 (two) times daily.     fenofibrate  (TRICOR ) 145 MG tablet TAKE 1 TABLET BY MOUTH ONCE EVERY EVENING 90 tablet 3   fluticasone  (FLONASE ) 50 MCG/ACT nasal spray PLACE 1-2 SPRAYS INTO BOTH NOSTRILS AS NEEDED 16 g 1   furosemide  (LASIX ) 20 MG tablet Take 1 tablet (20 mg total) by mouth daily. 30 tablet 11   latanoprost  (XALATAN ) 0.005 % ophthalmic solution PLACE 1 DROP INTO AFFECTED EYE ONCE EVERY EVENING FOR GLUCOMA 2.5 mL 5   levocetirizine (XYZAL ) 5 MG tablet TAKE 1 TABLET BY MOUTH ONCE EVERY EVENING 90 tablet 1   lisinopril  (ZESTRIL ) 10 MG tablet TAKE 1 TABLET BY MOUTH ONCE DAILY 30 tablet 2   lisinopril  (ZESTRIL ) 20 MG tablet TAKE 1 TABLET BY MOUTH TWICE DAILY 180 tablet 2   meclizine  (ANTIVERT ) 12.5 MG tablet Take 1 tablet (12.5 mg  total) by mouth daily. 30 tablet 2   metoprolol  succinate (TOPROL  XL) 25 MG 24 hr tablet Take 0.5 tablets (12.5 mg total) by mouth daily. 15 tablet 11   nystatin-triamcinolone  (MYCOLOG II) cream APPLY TO AFFECTED AREA(s) TWICE DAILY 60 g 5   pantoprazole  (PROTONIX ) 40 MG tablet TAKE 1 TABLET BY MOUTH TWICE DAILY 60 tablet 6   Polyethyl Glycol-Propyl Glycol (SYSTANE OP) Apply 1 drop to eye 5 (five) times daily as needed (dry eyes).     pregabalin  (LYRICA ) 75 MG capsule TAKE 1 CAPSULE BY MOUTH TWICE DAILY 60 capsule 2   ranolazine  (RANEXA ) 1000 MG SR tablet Take 1 tablet (1,000 mg total) by mouth 2 (two) times daily. 60 tablet 2   REPATHA  SURECLICK 140 MG/ML SOAJ INJECT THE CONTENTS OF 1 PEN SUBCUTANEOUSLY EVERY 14  DAYS 6 mL 2   nitroGLYCERIN  (NITROSTAT ) 0.4 MG SL tablet Place 1 tablet (0.4 mg total) under the tongue every 5 (five) minutes as needed for chest pain. 30 tablet 3   No current facility-administered medications for this visit.    Allergies:   Docosahexaenoic acid (dha) (fish), Lovaza [omega-3-acid ethyl esters (fish)], Atorvastatin, Hydrocodone, Ranolazine , Rosuvastatin, Statins, Folic acid, Folic acid-vit a3-cpu b12, and Pravastatin    Social History:   reports that he has never smoked. He has never been exposed to tobacco smoke. He has never used smokeless tobacco. He reports current alcohol  use. He reports that he does not use drugs.   Family History:  family history includes Alzheimer's disease in his mother; Cancer in his sister; Diabetes Mellitus II in his sister; Obesity in his sister; Parkinson's disease in his sister; Stroke in his father.    ROS:     Review of Systems  Constitutional: Negative.   HENT: Negative.    Eyes: Negative.   Respiratory: Negative.    Gastrointestinal: Negative.   Genitourinary: Negative.   Musculoskeletal: Negative.   Skin: Negative.   Neurological: Negative.   Endo/Heme/Allergies: Negative.   Psychiatric/Behavioral: Negative.    All  other systems reviewed and are negative.     All other systems are reviewed and negative.    PHYSICAL EXAM: VS:  BP (!) 116/58   Pulse (!) 57   Ht 5' 9 (1.753 m)   Wt 208 lb 3.2 oz (94.4 kg)   SpO2 94%   BMI 30.75 kg/m  , BMI Body mass index is 30.75 kg/m. Last weight:  Wt Readings from Last 3 Encounters:  07/18/24 208 lb 3.2 oz (94.4 kg)  07/11/24 210 lb 3.2 oz (95.3 kg)  05/09/24 209 lb (94.8 kg)     Physical Exam Vitals reviewed.  Constitutional:      Appearance: Normal appearance. He is normal weight.  HENT:     Head: Normocephalic.     Nose: Nose normal.     Mouth/Throat:     Mouth: Mucous membranes are moist.  Eyes:     Pupils: Pupils are equal, round, and reactive to light.  Cardiovascular:     Rate and Rhythm: Normal rate and regular rhythm.     Pulses: Normal pulses.     Heart sounds: Normal heart sounds.  Pulmonary:     Effort: Pulmonary effort is normal.  Abdominal:     General: Abdomen is flat. Bowel sounds are normal.  Musculoskeletal:        General: Normal range of motion.     Cervical back: Normal range of motion.  Skin:    General: Skin is warm.  Neurological:     General: No focal deficit present.     Mental Status: He is alert.  Psychiatric:        Mood and Affect: Mood normal.       EKG:   Recent Labs: 09/28/2023: TSH 0.448 01/14/2024: ALT 11 01/29/2024: BUN 24; Creatinine, Ser 1.12; Hemoglobin 13.1; Platelets 177; Potassium 3.8; Sodium 139    Lipid Panel    Component Value Date/Time   CHOL 103 09/28/2023 1150   TRIG 113 09/28/2023 1150   HDL 49 09/28/2023 1150   CHOLHDL 2.1 09/28/2023 1150   LDLCALC 33 09/28/2023 1150   LDLDIRECT 62 02/18/2021 0808      Other studies Reviewed: Additional studies/ records that were reviewed today include:  Review of the above records demonstrates:       No  data to display            ASSESSMENT AND PLAN:    ICD-10-CM   1. Other chest pain  R07.89 nitroGLYCERIN  (NITROSTAT ) 0.4  MG SL tablet    ranolazine  (RANEXA ) 1000 MG SR tablet    MYOCARDIAL PERFUSION IMAGING   recurrent chest pains. Advise stress test. Add carafate .    2. Essential hypertension  I10 nitroGLYCERIN  (NITROSTAT ) 0.4 MG SL tablet    ranolazine  (RANEXA ) 1000 MG SR tablet    MYOCARDIAL PERFUSION IMAGING    3. SOB (shortness of breath)  R06.02 nitroGLYCERIN  (NITROSTAT ) 0.4 MG SL tablet    ranolazine  (RANEXA ) 1000 MG SR tablet    MYOCARDIAL PERFUSION IMAGING    4. Mixed hyperlipidemia  E78.2 nitroGLYCERIN  (NITROSTAT ) 0.4 MG SL tablet    ranolazine  (RANEXA ) 1000 MG SR tablet    MYOCARDIAL PERFUSION IMAGING    5. Sinus bradycardia  R00.1 nitroGLYCERIN  (NITROSTAT ) 0.4 MG SL tablet    ranolazine  (RANEXA ) 1000 MG SR tablet    MYOCARDIAL PERFUSION IMAGING    6. Coronary artery disease involving coronary bypass graft of native heart with unstable angina pectoris (HCC)  I25.700 nitroGLYCERIN  (NITROSTAT ) 0.4 MG SL tablet    ranolazine  (RANEXA ) 1000 MG SR tablet    MYOCARDIAL PERFUSION IMAGING    7. Status post coronary artery balloon dilation  Z95.5 nitroGLYCERIN  (NITROSTAT ) 0.4 MG SL tablet    ranolazine  (RANEXA ) 1000 MG SR tablet    MYOCARDIAL PERFUSION IMAGING    8. Coronary artery disease of autologous bypass graft with stable angina pectoris  I25.728 nitroGLYCERIN  (NITROSTAT ) 0.4 MG SL tablet    ranolazine  (RANEXA ) 1000 MG SR tablet    MYOCARDIAL PERFUSION IMAGING       Problem List Items Addressed This Visit       Cardiovascular and Mediastinum   Coronary artery disease involving coronary bypass graft of native heart with unstable angina pectoris (HCC)   Relevant Medications   nitroGLYCERIN  (NITROSTAT ) 0.4 MG SL tablet   ranolazine  (RANEXA ) 1000 MG SR tablet   Other Relevant Orders   MYOCARDIAL PERFUSION IMAGING   Essential hypertension   Relevant Medications   nitroGLYCERIN  (NITROSTAT ) 0.4 MG SL tablet   ranolazine  (RANEXA ) 1000 MG SR tablet   Other Relevant Orders   MYOCARDIAL  PERFUSION IMAGING     Other   Hyperlipidemia   Relevant Medications   nitroGLYCERIN  (NITROSTAT ) 0.4 MG SL tablet   ranolazine  (RANEXA ) 1000 MG SR tablet   Other Relevant Orders   MYOCARDIAL PERFUSION IMAGING   Other chest pain - Primary   Relevant Medications   nitroGLYCERIN  (NITROSTAT ) 0.4 MG SL tablet   ranolazine  (RANEXA ) 1000 MG SR tablet   Other Relevant Orders   MYOCARDIAL PERFUSION IMAGING   Status post coronary artery balloon dilation   Relevant Medications   nitroGLYCERIN  (NITROSTAT ) 0.4 MG SL tablet   ranolazine  (RANEXA ) 1000 MG SR tablet   Other Relevant Orders   MYOCARDIAL PERFUSION IMAGING   Other Visit Diagnoses       SOB (shortness of breath)       Relevant Medications   nitroGLYCERIN  (NITROSTAT ) 0.4 MG SL tablet   ranolazine  (RANEXA ) 1000 MG SR tablet   Other Relevant Orders   MYOCARDIAL PERFUSION IMAGING     Sinus bradycardia       Relevant Medications   nitroGLYCERIN  (NITROSTAT ) 0.4 MG SL tablet   ranolazine  (RANEXA ) 1000 MG SR tablet   Other Relevant Orders   MYOCARDIAL PERFUSION IMAGING  Coronary artery disease of autologous bypass graft with stable angina pectoris       Relevant Medications   nitroGLYCERIN  (NITROSTAT ) 0.4 MG SL tablet   ranolazine  (RANEXA ) 1000 MG SR tablet   Other Relevant Orders   MYOCARDIAL PERFUSION IMAGING          Disposition:   Return in about 4 weeks (around 08/15/2024) for stress test and f/u.    Total time spent: 40 minutes  Signed,  Denyse Bathe, MD  07/18/2024 1:40 PM    [facility] "

## 2024-07-18 NOTE — Telephone Encounter (Signed)
 Patient called to let you know that you had stopped his nitroglycerin  but he's started back taking it on Sunday bc he was having chest pains and he's taken it yesterday and today and the chest pains have resolved

## 2024-07-22 ENCOUNTER — Telehealth: Payer: Self-pay

## 2024-07-22 NOTE — Telephone Encounter (Signed)
 10:57a Patient would like a call back about medication questions before his stress test Monday

## 2024-07-25 ENCOUNTER — Ambulatory Visit

## 2024-07-25 ENCOUNTER — Other Ambulatory Visit: Payer: Self-pay | Admitting: Family

## 2024-07-25 DIAGNOSIS — Z955 Presence of coronary angioplasty implant and graft: Secondary | ICD-10-CM

## 2024-07-25 DIAGNOSIS — I10 Essential (primary) hypertension: Secondary | ICD-10-CM

## 2024-07-25 DIAGNOSIS — R0602 Shortness of breath: Secondary | ICD-10-CM

## 2024-07-25 DIAGNOSIS — R001 Bradycardia, unspecified: Secondary | ICD-10-CM | POA: Diagnosis not present

## 2024-07-25 DIAGNOSIS — R0789 Other chest pain: Secondary | ICD-10-CM

## 2024-07-25 DIAGNOSIS — E782 Mixed hyperlipidemia: Secondary | ICD-10-CM

## 2024-07-25 DIAGNOSIS — I25700 Atherosclerosis of coronary artery bypass graft(s), unspecified, with unstable angina pectoris: Secondary | ICD-10-CM

## 2024-07-25 DIAGNOSIS — I25728 Atherosclerosis of autologous artery coronary artery bypass graft(s) with other forms of angina pectoris: Secondary | ICD-10-CM

## 2024-07-25 MED ORDER — TECHNETIUM TC 99M SESTAMIBI GENERIC - CARDIOLITE
30.2000 | Freq: Once | INTRAVENOUS | Status: AC | PRN
  Administered 2024-07-25: 30.2 via INTRAVENOUS

## 2024-07-25 MED ORDER — TECHNETIUM TC 99M SESTAMIBI GENERIC - CARDIOLITE
10.8000 | Freq: Once | INTRAVENOUS | Status: AC | PRN
  Administered 2024-07-25: 10.8 via INTRAVENOUS

## 2024-07-26 LAB — GLUCOSE, POCT (MANUAL RESULT ENTRY): POC Glucose: 116 mg/dL — AB (ref 70–99)

## 2024-07-26 NOTE — Congregational Nurse Program (Signed)
" °  Dept: [phone]   Congregational Nurse Program Note  Date of Encounter: 07/26/2024  Past Medical History: Past Medical History:  Diagnosis Date   Arthritis    Benign neoplasm of colon 04/13/2008   Overview:  Benign Polyps Of The Large Intestine - colonoscopy 2006,dr Graig Deal of this note might be different from the original. Formatting of this note might be different from the original. Benign Polyps Of The Large Intestine - colonoscopy 2006,dr Graig Deal of this note might be different from the original. Overview: Benign Polyps Of The Large Intestine - colonoscopy 2006,dr    Blurring of visual image 05/28/2018   Cancer (HCC) 1995   prostate ca    Chest pain 07/27/2016   Chest pain, unspecified 07/27/2016   Coronary artery disease    Esophageal stricture 08/2016   GERD (gastroesophageal reflux disease)    Glaucoma    History of hiatal hernia    Hx of CABG 02/24/2018   Formatting of this note might be different from the original. Two-vessel bypass. Formatting of this note might be different from the original. Formatting of this note might be different from the original. Two-vessel bypass. Formatting of this note might be different from the original. Formatting of this note might be different from the original. Two-vessel bypass.   Hypertension    Malignant neoplasm of prostate (HCC) 04/23/2017   MI (mitral incompetence)    MI, acute, non ST segment elevation (HCC) 10/05/2012   Formatting of this note might be different from the original. Note: Unchanged - x4   Old myocardial infarction 05/01/2019   Note: Unchanged - x4   Orthopedic aftercare 10/08/2017   Peripheral neuropathy    Peripheral vascular disease    S/P shoulder replacement, left 09/25/2017   Skin sensation disturbance 06/23/2007   Formatting of this note might be different from the original. Tingling (Paresthesia) Formatting of this note might be different from the original. Formatting of this  note might be different from the original. Tingling (Paresthesia)   Tear of meniscus of knee 11/11/2013   Formatting of this note might be different from the original. Formatting of this note might be different from the original. ICD-10 cut over Formatting of this note might be different from the original. Overview: ICD-10 cut over   Vertigo     Encounter Details:  Community Questionnaire - 07/26/24 1637       Questionnaire   Ask client: Do you give verbal consent for me to treat you today? Yes    Student Assistance N/A    Location Patient Served  [facility]    Encounter Setting CN site    Population Status Unknown    Insurance Medicare    Insurance/Financial Assistance Referral N/A    Medication N/A    Medical Provider Yes    Medical Referrals Made NA    Medical Appointment Completed N/A    Screenings CN Performed (remember to also record results) Blood Pressure;Blood Glucose    CNP Interventions Advocate/Support    ED Visit Averted N/A            "

## 2024-07-28 ENCOUNTER — Other Ambulatory Visit: Payer: Self-pay | Admitting: Family

## 2024-07-28 ENCOUNTER — Other Ambulatory Visit: Payer: Self-pay | Admitting: Cardiovascular Disease

## 2024-07-28 ENCOUNTER — Telehealth: Payer: Self-pay

## 2024-07-28 NOTE — Telephone Encounter (Signed)
 Patient LM asking about his norvasc  being refilled, stating that it  was sent to us  3 times by his pharmacy but this medication was D/C in July 2025, will call patient back and let him know  this was D/C but a PA [facility]

## 2024-08-03 ENCOUNTER — Telehealth: Payer: Self-pay | Admitting: Urology

## 2024-08-03 NOTE — Telephone Encounter (Signed)
 The patient called the office requesting treatment for a possible UTI. He reports burning with urination that persists for a while after he finishes voiding. He stated he is unable to drive to [facility] for an appointment due to his age (89 years old) and lack of transportation. He reported having similar symptoms in the past and was treated by you in [place] with either sulfamethoxazole  (dose unknown) or cefuroxime  axetil 250 mg. He requested that, if medication is prescribed, it be sent to [facility] in [place], as they are able to deliver it to him.

## 2024-08-04 NOTE — Telephone Encounter (Signed)
 Patient called and states he is able to go to the [facility] lab.

## 2024-08-05 ENCOUNTER — Telehealth: Payer: Self-pay | Admitting: Family

## 2024-08-05 MED ORDER — NITROFURANTOIN MONOHYD MACRO 100 MG PO CAPS: 100.0000 mg | ORAL_CAPSULE | Freq: Two times a day (BID) | ORAL | 0 refills | Status: AC

## 2024-08-05 NOTE — Telephone Encounter (Signed)
 Patient left VM stating he has a real bad urine infection and cannot drive right now so he has no way to get here to give a specimen but would like something called in today so [facility] can deliver it to him today or tomorrow before the bad weather comes. Please advise.

## 2024-08-08 ENCOUNTER — Other Ambulatory Visit: Payer: Self-pay | Admitting: Family

## 2024-08-08 ENCOUNTER — Ambulatory Visit

## 2024-08-08 ENCOUNTER — Ambulatory Visit: Admitting: Cardiovascular Disease

## 2024-08-09 ENCOUNTER — Other Ambulatory Visit: Payer: Self-pay

## 2024-08-09 DIAGNOSIS — R350 Frequency of micturition: Secondary | ICD-10-CM

## 2024-08-12 ENCOUNTER — Encounter: Payer: Self-pay | Admitting: Cardiovascular Disease

## 2024-08-12 ENCOUNTER — Ambulatory Visit: Admitting: Cardiovascular Disease

## 2024-08-12 ENCOUNTER — Ambulatory Visit: Admitting: Family

## 2024-08-12 VITALS — BP 124/72 | HR 69 | Ht 69.0 in | Wt 205.0 lb

## 2024-08-12 DIAGNOSIS — I10 Essential (primary) hypertension: Secondary | ICD-10-CM | POA: Diagnosis not present

## 2024-08-12 DIAGNOSIS — E782 Mixed hyperlipidemia: Secondary | ICD-10-CM | POA: Diagnosis not present

## 2024-08-12 DIAGNOSIS — I25700 Atherosclerosis of coronary artery bypass graft(s), unspecified, with unstable angina pectoris: Secondary | ICD-10-CM | POA: Diagnosis not present

## 2024-08-12 DIAGNOSIS — I25728 Atherosclerosis of autologous artery coronary artery bypass graft(s) with other forms of angina pectoris: Secondary | ICD-10-CM | POA: Diagnosis not present

## 2024-08-12 DIAGNOSIS — R001 Bradycardia, unspecified: Secondary | ICD-10-CM | POA: Diagnosis not present

## 2024-08-12 DIAGNOSIS — R0602 Shortness of breath: Secondary | ICD-10-CM | POA: Diagnosis not present

## 2024-08-12 DIAGNOSIS — Z955 Presence of coronary angioplasty implant and graft: Secondary | ICD-10-CM | POA: Diagnosis not present

## 2024-08-12 DIAGNOSIS — R0789 Other chest pain: Secondary | ICD-10-CM | POA: Diagnosis not present

## 2024-08-12 DIAGNOSIS — R55 Syncope and collapse: Secondary | ICD-10-CM

## 2024-08-12 DIAGNOSIS — E538 Deficiency of other specified B group vitamins: Secondary | ICD-10-CM

## 2024-08-12 MED ORDER — CYANOCOBALAMIN 1000 MCG/ML IJ SOLN
1000.0000 ug | Freq: Once | INTRAMUSCULAR | Status: AC
  Administered 2024-08-12: 1000 ug via INTRAMUSCULAR

## 2024-08-12 MED ORDER — CYANOCOBALAMIN 1000 MCG/ML IJ SOLN: 1000.0000 ug | Freq: Once | INTRAMUSCULAR | Status: AC

## 2024-08-12 NOTE — Progress Notes (Signed)
 "     Cardiology Office Note   Date:  08/12/2024   ID:  [name] [name], DOB Jan 07, 1936, MRN [id number]  PCP:  Orlean Alan HERO, FNP  Cardiologist:  Denyse Bathe, MD      History of Present Illness: [name] [name] is a 89 y.o. male who presents for  Chief Complaint  Patient presents with   Follow-up    4 week  NST Follow-up     No chest pain or SOB.      Past Medical History:  Diagnosis Date   Arthritis    Benign neoplasm of colon 04/13/2008   Overview:  Benign Polyps Of The Large Intestine - colonoscopy 2006,dr Graig Deal of this note might be different from the original. Formatting of this note might be different from the original. Benign Polyps Of The Large Intestine - colonoscopy 2006,dr Graig Deal of this note might be different from the original. Overview: Benign Polyps Of The Large Intestine - colonoscopy 2006,dr    Blurring of visual image 05/28/2018   Cancer (HCC) 1995   prostate ca    Chest pain 07/27/2016   Chest pain, unspecified 07/27/2016   Coronary artery disease    Esophageal stricture 08/2016   GERD (gastroesophageal reflux disease)    Glaucoma    History of hiatal hernia    Hx of CABG 02/24/2018   Formatting of this note might be different from the original. Two-vessel bypass. Formatting of this note might be different from the original. Formatting of this note might be different from the original. Two-vessel bypass. Formatting of this note might be different from the original. Formatting of this note might be different from the original. Two-vessel bypass.   Hypertension    Malignant neoplasm of prostate (HCC) 04/23/2017   MI (mitral incompetence)    MI, acute, non ST segment elevation (HCC) 10/05/2012   Formatting of this note might be different from the original. Note: Unchanged - x4   Old myocardial infarction 05/01/2019   Note: Unchanged - x4   Orthopedic aftercare 10/08/2017   Peripheral neuropathy    Peripheral vascular  disease    S/P shoulder replacement, left 09/25/2017   Skin sensation disturbance 06/23/2007   Formatting of this note might be different from the original. Tingling (Paresthesia) Formatting of this note might be different from the original. Formatting of this note might be different from the original. Tingling (Paresthesia)   Tear of meniscus of knee 11/11/2013   Formatting of this note might be different from the original. Formatting of this note might be different from the original. ICD-10 cut over Formatting of this note might be different from the original. Overview: ICD-10 cut over   Vertigo      Past Surgical History:  Procedure Laterality Date   COLONOSCOPY     COLONOSCOPY WITH PROPOFOL  N/A 03/27/2023   Procedure: COLONOSCOPY WITH PROPOFOL ;  Surgeon: Rollin Dover, MD;  Location: [facility];  Service: Gastroenterology;  Laterality: N/A;   CORONARY ANGIOPLASTY WITH STENT PLACEMENT  2005   x9    CORONARY ARTERY BYPASS GRAFT  2011   CORONARY BALLOON ANGIOPLASTY N/A 01/28/2024   Procedure: CORONARY BALLOON ANGIOPLASTY;  Surgeon: Florencio Cara BIRCH, MD;  Location: [facility] INVASIVE CV LAB;  Service: Cardiovascular;  Laterality: N/A;   CORONARY STENT PLACEMENT  07/2014   ESOPHAGOGASTRODUODENOSCOPY     ESOPHAGOGASTRODUODENOSCOPY (EGD) WITH ESOPHAGEAL DILATION  2018   knee scope Bilateral    LEFT HEART CATH AND CORONARY ANGIOGRAPHY Left 01/28/2024  Procedure: LEFT HEART CATH AND CORONARY ANGIOGRAPHY with possible coronary intervention;  Surgeon: Fernand Denyse LABOR, MD;  Location: [facility] INVASIVE CV LAB;  Service: Cardiovascular;  Laterality: Left;   LEG SURGERY Right 09/2018   LIPOMA EXCISION N/A 09/24/2016   Procedure: EXCISION SUBCUTANEOUS LIPOMA ON UPPER BACK;  Surgeon: Donnice Lima, MD;  Location: [facility] OR;  Service: General;  Laterality: N/A;   POLYPECTOMY  03/27/2023   Procedure: POLYPECTOMY;  Surgeon: Rollin Dover, MD;  Location: [facility];  Service: Gastroenterology;;   Prostectomy  1995    REVERSE SHOULDER ARTHROPLASTY Left 09/25/2017   REVERSE SHOULDER ARTHROPLASTY Left 09/25/2017   Procedure: LEFT REVERSE SHOULDER ARTHROPLASTY; deltoid repair;  Surgeon: Kay Kemps, MD;  Location[facility] OR;  Service: Orthopedics;  Laterality: Left;   ROTATOR CUFF REPAIR Left 2001     Current Outpatient Medications  Medication Sig Dispense Refill   acetaminophen  (TYLENOL ) 650 MG CR tablet Take 650 mg by mouth as needed for pain. 2 tablets in the morning and 2 tablets at bedtime     aspirin  EC 81 MG tablet Take 81 mg by mouth daily.     azelastine  (OPTIVAR ) 0.05 % ophthalmic solution USE ONE DROP IN BOTH EYES TWICE DAILY 6 mL 5   calcium  carbonate (TUMS - DOSED IN MG ELEMENTAL CALCIUM ) 500 MG chewable tablet Chew 2 tablets by mouth daily as needed for indigestion or heartburn.     celecoxib  (CELEBREX ) 200 MG capsule TAKE 1 CAPSULE BY MOUTH ONCE DAILY AT NOON 30 capsule 5   cholecalciferol  (VITAMIN D ) 1000 units tablet Take 1,000 Units by mouth daily.     clopidogrel  (PLAVIX ) 75 MG tablet Take 1 tablet (75 mg total) by mouth daily with breakfast. 90 tablet 2   cycloSPORINE  (RESTASIS ) 0.05 % ophthalmic emulsion Place 1 drop into both eyes 2 (two) times daily.     fenofibrate  (TRICOR ) 145 MG tablet TAKE 1 TABLET BY MOUTH ONCE EVERY EVENING 90 tablet 3   fluticasone  (FLONASE ) 50 MCG/ACT nasal spray PLACE 1-2 SPRAYS INTO BOTH NOSTRILS AS NEEDED 16 g 1   furosemide  (LASIX ) 20 MG tablet Take 1 tablet (20 mg total) by mouth daily. 30 tablet 11   latanoprost  (XALATAN ) 0.005 % ophthalmic solution PLACE 1 DROP INTO AFFECTED EYE ONCE EVERY EVENING FOR GLUCOMA 2.5 mL 5   levocetirizine (XYZAL ) 5 MG tablet TAKE 1 TABLET BY MOUTH ONCE EVERY EVENING 90 tablet 1   lisinopril  (ZESTRIL ) 10 MG tablet TAKE 1 TABLET BY MOUTH ONCE DAILY 30 tablet 2   lisinopril  (ZESTRIL ) 20 MG tablet TAKE 1 TABLET BY MOUTH TWICE DAILY 180 tablet 2   meclizine  (ANTIVERT ) 12.5 MG tablet Take 1 tablet (12.5 mg total) by mouth daily. 30  tablet 2   metoprolol  succinate (TOPROL  XL) 25 MG 24 hr tablet Take 0.5 tablets (12.5 mg total) by mouth daily. 15 tablet 11   nitrofurantoin , macrocrystal-monohydrate, (MACROBID ) 100 MG capsule Take 1 capsule (100 mg total) by mouth 2 (two) times daily. 14 capsule 0   nitroGLYCERIN  (NITROSTAT ) 0.4 MG SL tablet Place 1 tablet (0.4 mg total) under the tongue every 5 (five) minutes as needed for chest pain. 30 tablet 3   nystatin-triamcinolone  (MYCOLOG II) cream APPLY TO AFFECTED AREA(s) TWICE DAILY 60 g 5   pantoprazole  (PROTONIX ) 40 MG tablet TAKE 1 TABLET BY MOUTH TWICE DAILY 60 tablet 6   Polyethyl Glycol-Propyl Glycol (SYSTANE OP) Apply 1 drop to eye 5 (five) times daily as needed (dry eyes).     pregabalin  (LYRICA )  75 MG capsule TAKE 1 CAPSULE BY MOUTH TWICE DAILY 60 capsule 2   ranolazine  (RANEXA ) 1000 MG SR tablet Take 1 tablet (1,000 mg total) by mouth 2 (two) times daily. 60 tablet 2   REPATHA  SURECLICK 140 MG/ML SOAJ INJECT THE CONTENTS OF 1 PEN SUBCUTANEOUSLY EVERY 14 DAYS 6 mL 2   Current Facility-Administered Medications  Medication Dose Route Frequency Provider Last Rate Last Admin   cyanocobalamin  (VITAMIN B12) injection 1,000 mcg  1,000 mcg Intramuscular Once Orlean Alan HERO, FNP        Allergies:   Docosahexaenoic acid (dha) (fish), Lovaza [omega-3-acid ethyl esters (fish)], Atorvastatin, Hydrocodone, Ranolazine , Rosuvastatin, Statins, Folic acid, Folic acid-vit a3-cpu b12, and Pravastatin    Social History:   reports that he has never smoked. He has never been exposed to tobacco smoke. He has never used smokeless tobacco. He reports current alcohol  use. He reports that he does not use drugs.   Family History:  family history includes Alzheimer's disease in his mother; Cancer in his sister; Diabetes Mellitus II in his sister; Obesity in his sister; Parkinson's disease in his sister; Stroke in his father.    ROS:     Review of Systems  Constitutional: Negative.   HENT:  Negative.    Eyes: Negative.   Respiratory: Negative.    Gastrointestinal: Negative.   Genitourinary: Negative.   Musculoskeletal: Negative.   Skin: Negative.   Neurological: Negative.   Endo/Heme/Allergies: Negative.   Psychiatric/Behavioral: Negative.    All other systems reviewed and are negative.     All other systems are reviewed and negative.    PHYSICAL EXAM: VS:  BP 124/72   Pulse 69   Ht 5' 9 (1.753 m)   Wt 205 lb (93 kg)   SpO2 97%   BMI 30.27 kg/m  , BMI Body mass index is 30.27 kg/m. Last weight:  Wt Readings from Last 3 Encounters:  08/12/24 205 lb (93 kg)  07/18/24 208 lb 3.2 oz (94.4 kg)  07/11/24 210 lb 3.2 oz (95.3 kg)     Physical Exam Vitals reviewed.  Constitutional:      Appearance: Normal appearance. He is normal weight.  HENT:     Head: Normocephalic.     Nose: Nose normal.     Mouth/Throat:     Mouth: Mucous membranes are moist.  Eyes:     Pupils: Pupils are equal, round, and reactive to light.  Cardiovascular:     Rate and Rhythm: Normal rate and regular rhythm.     Pulses: Normal pulses.     Heart sounds: Normal heart sounds.  Pulmonary:     Effort: Pulmonary effort is normal.  Abdominal:     General: Abdomen is flat. Bowel sounds are normal.  Musculoskeletal:        General: Normal range of motion.     Cervical back: Normal range of motion.  Skin:    General: Skin is warm.  Neurological:     General: No focal deficit present.     Mental Status: He is alert.  Psychiatric:        Mood and Affect: Mood normal.       EKG:   Recent Labs: 09/28/2023: TSH 0.448 01/14/2024: ALT 11 01/29/2024: BUN 24; Creatinine, Ser 1.12; Hemoglobin 13.1; Platelets 177; Potassium 3.8; Sodium 139    Lipid Panel    Component Value Date/Time   CHOL 103 09/28/2023 1150   TRIG 113 09/28/2023 1150   HDL 49 09/28/2023 1150  CHOLHDL 2.1 09/28/2023 1150   LDLCALC 33 09/28/2023 1150   LDLDIRECT 62 02/18/2021 9191      Other studies  Reviewed: Additional studies/ records that were reviewed today include:  Review of the above records demonstrates:       No data to display            ASSESSMENT AND PLAN:    ICD-10-CM   1. SOB (shortness of breath)  R06.02     2. Essential hypertension  I10     3. Mixed hyperlipidemia  E78.2     4. Sinus bradycardia  R00.1     5. Coronary artery disease involving coronary bypass graft of native heart with unstable angina pectoris (HCC)  I25.700     6. Status post coronary artery balloon dilation  Z95.5     7. Coronary artery disease of autologous bypass graft with stable angina pectoris  I25.728     8. Other chest pain  R07.89    has infrequent chest pain, stress test showed mild septal wall reversible defect. Will continue medical treatment wit 100 mg ranexa  bid.    9. Syncope, unspecified syncope type  R55        Problem List Items Addressed This Visit       Cardiovascular and Mediastinum   Coronary artery disease involving coronary bypass graft of native heart with unstable angina pectoris (HCC)   Essential hypertension     Other   Hyperlipidemia   Other chest pain   Status post coronary artery balloon dilation   Other Visit Diagnoses       SOB (shortness of breath)    -  Primary     Sinus bradycardia         Coronary artery disease of autologous bypass graft with stable angina pectoris         Syncope, unspecified syncope type              Disposition:   Return in about 2 months (around 10/10/2024).    Total time spent: 35 minutes  Signed,  Denyse Bathe, MD  08/12/2024 11:48 AM    [facility] "

## 2024-08-16 ENCOUNTER — Encounter: Payer: Self-pay | Admitting: Family

## 2024-08-17 ENCOUNTER — Telehealth: Payer: Self-pay | Admitting: Family

## 2024-08-17 NOTE — Telephone Encounter (Signed)
 Patient called in stating that since increasing his ranolazine  to 1000 mg BID he is now having spells of passing out. States he has passed out 4 times and fallen. If he bends over he falls. He said he was doing fine on the dose of 500 mg BID. Please advise on what to do.

## 2024-08-18 ENCOUNTER — Other Ambulatory Visit: Payer: Self-pay

## 2024-08-18 MED ORDER — RANOLAZINE ER 500 MG PO TB12: 500.0000 mg | ORAL_TABLET | Freq: Two times a day (BID) | ORAL | 3 refills | Status: AC

## 2024-08-18 NOTE — Telephone Encounter (Signed)
 Rx sent.

## 2024-08-19 ENCOUNTER — Telehealth: Payer: Self-pay

## 2024-08-19 NOTE — Telephone Encounter (Signed)
 PT called in regarding his dosage increase on previously stated medicine. States anytime he bends over he passes out & needs someone to return his call

## 2024-09-12 ENCOUNTER — Ambulatory Visit

## 2024-10-10 ENCOUNTER — Ambulatory Visit: Admitting: Family

## 2024-10-31 ENCOUNTER — Ambulatory Visit: Admitting: Cardiovascular Disease

## 2024-11-14 ENCOUNTER — Ambulatory Visit: Admitting: Urology
